# Patient Record
Sex: Female | Born: 1940 | Race: Black or African American | Hispanic: No | Smoking: Never smoker
Health system: Southern US, Community
[De-identification: ages and names within clinical notes are randomized; demographics above are authoritative.]

## PROBLEM LIST (undated history)

## (undated) DIAGNOSIS — I10 Essential (primary) hypertension: Secondary | ICD-10-CM

## (undated) DIAGNOSIS — M1990 Unspecified osteoarthritis, unspecified site: Secondary | ICD-10-CM

## (undated) DIAGNOSIS — I341 Nonrheumatic mitral (valve) prolapse: Secondary | ICD-10-CM

## (undated) DIAGNOSIS — C8338 Diffuse large B-cell lymphoma, lymph nodes of multiple sites: Secondary | ICD-10-CM

## (undated) DIAGNOSIS — I340 Nonrheumatic mitral (valve) insufficiency: Secondary | ICD-10-CM

## (undated) DIAGNOSIS — E43 Unspecified severe protein-calorie malnutrition: Secondary | ICD-10-CM

## (undated) DIAGNOSIS — E7800 Pure hypercholesterolemia, unspecified: Secondary | ICD-10-CM

## (undated) HISTORY — DX: Nonrheumatic mitral (valve) prolapse: I34.1

## (undated) HISTORY — DX: Nonrheumatic mitral (valve) insufficiency: I34.0

## (undated) HISTORY — DX: Unspecified severe protein-calorie malnutrition: E43

## (undated) HISTORY — DX: Diffuse large b-cell lymphoma, lymph nodes of multiple sites: C83.38

## (undated) HISTORY — PX: ABDOMINAL HYSTERECTOMY: SHX81

## (undated) HISTORY — PX: JOINT REPLACEMENT: SHX530

## (undated) SURGERY — TOTAL HIP REVISION
Anesthesia: General | Laterality: Left

---

## 2005-02-19 ENCOUNTER — Ambulatory Visit: Payer: Self-pay | Admitting: Family Medicine

## 2005-04-03 ENCOUNTER — Ambulatory Visit: Payer: Self-pay | Admitting: Family Medicine

## 2005-04-04 ENCOUNTER — Ambulatory Visit (HOSPITAL_COMMUNITY): Admission: RE | Admit: 2005-04-04 | Discharge: 2005-04-04 | Payer: Self-pay | Admitting: Family Medicine

## 2005-05-22 ENCOUNTER — Ambulatory Visit: Payer: Self-pay | Admitting: Family Medicine

## 2005-07-10 ENCOUNTER — Ambulatory Visit: Payer: Self-pay | Admitting: Family Medicine

## 2005-07-18 ENCOUNTER — Ambulatory Visit: Payer: Self-pay | Admitting: Family Medicine

## 2005-10-24 ENCOUNTER — Encounter (INDEPENDENT_AMBULATORY_CARE_PROVIDER_SITE_OTHER): Payer: Self-pay | Admitting: Specialist

## 2005-10-24 ENCOUNTER — Encounter (INDEPENDENT_AMBULATORY_CARE_PROVIDER_SITE_OTHER): Payer: Self-pay | Admitting: Family Medicine

## 2005-10-24 ENCOUNTER — Other Ambulatory Visit: Admission: RE | Admit: 2005-10-24 | Discharge: 2005-10-24 | Payer: Self-pay | Admitting: Family Medicine

## 2005-10-24 ENCOUNTER — Ambulatory Visit: Payer: Self-pay | Admitting: Family Medicine

## 2006-02-13 ENCOUNTER — Ambulatory Visit: Payer: Self-pay | Admitting: Family Medicine

## 2006-03-31 ENCOUNTER — Ambulatory Visit: Payer: Self-pay | Admitting: Family Medicine

## 2006-04-14 ENCOUNTER — Ambulatory Visit (HOSPITAL_COMMUNITY): Admission: RE | Admit: 2006-04-14 | Discharge: 2006-04-14 | Payer: Self-pay | Admitting: Family Medicine

## 2006-09-11 ENCOUNTER — Ambulatory Visit: Payer: Self-pay | Admitting: Family Medicine

## 2007-04-22 ENCOUNTER — Ambulatory Visit: Payer: Self-pay | Admitting: Internal Medicine

## 2007-05-12 ENCOUNTER — Ambulatory Visit (HOSPITAL_COMMUNITY): Admission: RE | Admit: 2007-05-12 | Discharge: 2007-05-12 | Payer: Self-pay | Admitting: Family Medicine

## 2007-06-04 ENCOUNTER — Ambulatory Visit: Payer: Self-pay | Admitting: Family Medicine

## 2007-07-07 ENCOUNTER — Encounter (INDEPENDENT_AMBULATORY_CARE_PROVIDER_SITE_OTHER): Payer: Self-pay | Admitting: Nurse Practitioner

## 2007-07-07 ENCOUNTER — Ambulatory Visit: Payer: Self-pay | Admitting: Family Medicine

## 2007-07-07 LAB — CONVERTED CEMR LAB
ALT: 21 units/L (ref 0–35)
AST: 25 units/L (ref 0–37)
Alkaline Phosphatase: 93 units/L (ref 39–117)
Cholesterol: 175 mg/dL (ref 0–200)
Creatinine, Ser: 0.81 mg/dL (ref 0.40–1.20)
LDL Cholesterol: 97 mg/dL (ref 0–99)
Sodium: 140 meq/L (ref 135–145)
Total Bilirubin: 0.5 mg/dL (ref 0.3–1.2)
Total CHOL/HDL Ratio: 2.7
VLDL: 14 mg/dL (ref 0–40)

## 2007-09-15 ENCOUNTER — Ambulatory Visit: Payer: Self-pay | Admitting: Internal Medicine

## 2007-12-28 ENCOUNTER — Ambulatory Visit: Payer: Self-pay | Admitting: Internal Medicine

## 2007-12-29 ENCOUNTER — Ambulatory Visit (HOSPITAL_COMMUNITY): Admission: RE | Admit: 2007-12-29 | Discharge: 2007-12-29 | Payer: Self-pay | Admitting: Internal Medicine

## 2008-02-19 ENCOUNTER — Ambulatory Visit: Payer: Self-pay | Admitting: Family Medicine

## 2008-04-15 HISTORY — PX: TOTAL HIP ARTHROPLASTY: SHX124

## 2008-06-20 ENCOUNTER — Ambulatory Visit (HOSPITAL_COMMUNITY): Admission: RE | Admit: 2008-06-20 | Discharge: 2008-06-20 | Payer: Self-pay | Admitting: Family Medicine

## 2009-02-23 ENCOUNTER — Ambulatory Visit: Payer: Self-pay | Admitting: Family Medicine

## 2009-02-23 LAB — CONVERTED CEMR LAB: Microalb, Ur: 0.5 mg/dL (ref 0.00–1.89)

## 2009-04-20 ENCOUNTER — Encounter (INDEPENDENT_AMBULATORY_CARE_PROVIDER_SITE_OTHER): Payer: Self-pay | Admitting: Family Medicine

## 2009-04-20 ENCOUNTER — Ambulatory Visit: Payer: Self-pay | Admitting: Internal Medicine

## 2009-04-20 LAB — CONVERTED CEMR LAB
ALT: 14 units/L (ref 0–35)
Alkaline Phosphatase: 86 units/L (ref 39–117)
CO2: 22 meq/L (ref 19–32)
Cholesterol: 163 mg/dL (ref 0–200)
Creatinine, Ser: 0.7 mg/dL (ref 0.40–1.20)
Total Bilirubin: 0.4 mg/dL (ref 0.3–1.2)
Total CHOL/HDL Ratio: 2.6
VLDL: 13 mg/dL (ref 0–40)
Vit D, 25-Hydroxy: 34 ng/mL (ref 30–89)

## 2009-06-24 ENCOUNTER — Emergency Department (HOSPITAL_COMMUNITY): Admission: EM | Admit: 2009-06-24 | Discharge: 2009-06-24 | Payer: Self-pay | Admitting: Emergency Medicine

## 2009-06-26 ENCOUNTER — Ambulatory Visit (HOSPITAL_COMMUNITY): Admission: RE | Admit: 2009-06-26 | Discharge: 2009-06-26 | Payer: Self-pay | Admitting: Internal Medicine

## 2009-07-20 ENCOUNTER — Ambulatory Visit: Payer: Self-pay | Admitting: Family Medicine

## 2009-10-19 ENCOUNTER — Encounter: Admission: RE | Admit: 2009-10-19 | Discharge: 2009-11-23 | Payer: Self-pay | Admitting: Family Medicine

## 2009-12-12 ENCOUNTER — Inpatient Hospital Stay (HOSPITAL_COMMUNITY): Admission: RE | Admit: 2009-12-12 | Discharge: 2009-12-15 | Payer: Self-pay | Admitting: Orthopedic Surgery

## 2010-05-29 ENCOUNTER — Other Ambulatory Visit (HOSPITAL_COMMUNITY): Payer: Self-pay | Admitting: Pulmonary Disease

## 2010-05-29 ENCOUNTER — Other Ambulatory Visit: Payer: Self-pay

## 2010-05-29 DIAGNOSIS — Z1231 Encounter for screening mammogram for malignant neoplasm of breast: Secondary | ICD-10-CM

## 2010-06-28 LAB — CBC
MCH: 29.8 pg (ref 26.0–34.0)
MCHC: 33.9 g/dL (ref 30.0–36.0)
Platelets: 68 10*3/uL — ABNORMAL LOW (ref 150–400)
RBC: 2.89 MIL/uL — ABNORMAL LOW (ref 3.87–5.11)

## 2010-06-28 LAB — PROTIME-INR
INR: 1.67 — ABNORMAL HIGH (ref 0.00–1.49)
Prothrombin Time: 31.4 seconds — ABNORMAL HIGH (ref 11.6–15.2)

## 2010-06-29 ENCOUNTER — Ambulatory Visit (HOSPITAL_COMMUNITY)
Admission: RE | Admit: 2010-06-29 | Discharge: 2010-06-29 | Disposition: A | Discharge status: Home / Self Care | Payer: Medicare Other | Source: Ambulatory Visit | Attending: Pulmonary Disease | Admitting: Pulmonary Disease

## 2010-06-29 DIAGNOSIS — Z1231 Encounter for screening mammogram for malignant neoplasm of breast: Secondary | ICD-10-CM | POA: Insufficient documentation

## 2010-06-29 LAB — COMPREHENSIVE METABOLIC PANEL
BUN: 16 mg/dL (ref 6–23)
CO2: 31 mEq/L (ref 19–32)
Chloride: 100 mEq/L (ref 96–112)
Creatinine, Ser: 0.74 mg/dL (ref 0.4–1.2)
GFR calc non Af Amer: >60 mL/min (ref >60)
Glucose, Bld: 94 mg/dL (ref 70–99)
Total Bilirubin: 0.8 mg/dL (ref 0.3–1.2)

## 2010-06-29 LAB — TYPE AND SCREEN: Antibody Screen: NEGATIVE

## 2010-06-29 LAB — CBC
HCT: 24 % — ABNORMAL LOW (ref 36.0–46.0)
Hemoglobin: 12.2 g/dL (ref 12.0–15.0)
MCH: 30 pg (ref 26.0–34.0)
MCH: 30.2 pg (ref 26.0–34.0)
MCH: 30.6 pg (ref 26.0–34.0)
MCHC: 33.6 g/dL (ref 30.0–36.0)
MCHC: 34.2 g/dL (ref 30.0–36.0)
MCV: 89.6 fL (ref 78.0–100.0)
MCV: 89.9 fL (ref 78.0–100.0)
MCV: 91.6 fL (ref 78.0–100.0)
Platelets: 69 10*3/uL — ABNORMAL LOW (ref 150–400)
RBC: 4.07 MIL/uL (ref 3.87–5.11)
RDW: 13.5 % (ref 11.5–15.5)
RDW: 13.8 % (ref 11.5–15.5)
WBC: 6.6 10*3/uL (ref 4.0–10.5)

## 2010-06-29 LAB — URINALYSIS, ROUTINE W REFLEX MICROSCOPIC
Ketones, ur: NEGATIVE mg/dL
Nitrite: NEGATIVE
Urobilinogen, UA: 0.2 mg/dL (ref 0.0–1.0)
pH: 6 (ref 5.0–8.0)

## 2010-06-29 LAB — SURGICAL PCR SCREEN: Staphylococcus aureus: NEGATIVE

## 2010-06-29 LAB — APTT: aPTT: 31 seconds (ref 24–37)

## 2010-06-29 LAB — PROTIME-INR
INR: 0.87 (ref 0.00–1.49)
Prothrombin Time: 17.4 seconds — ABNORMAL HIGH (ref 11.6–15.2)

## 2011-04-21 ENCOUNTER — Emergency Department (HOSPITAL_COMMUNITY)
Admission: EM | Admit: 2011-04-21 | Discharge: 2011-04-21 | Disposition: A | Discharge status: Home / Self Care | Payer: Medicare Other | Attending: Emergency Medicine | Admitting: Emergency Medicine

## 2011-04-21 ENCOUNTER — Emergency Department (HOSPITAL_COMMUNITY): Payer: Medicare Other

## 2011-04-21 DIAGNOSIS — Z96649 Presence of unspecified artificial hip joint: Secondary | ICD-10-CM | POA: Insufficient documentation

## 2011-04-21 DIAGNOSIS — M129 Arthropathy, unspecified: Secondary | ICD-10-CM | POA: Insufficient documentation

## 2011-04-21 DIAGNOSIS — T84019A Broken internal joint prosthesis, unspecified site, initial encounter: Secondary | ICD-10-CM | POA: Insufficient documentation

## 2011-04-21 DIAGNOSIS — Y831 Surgical operation with implant of artificial internal device as the cause of abnormal reaction of the patient, or of later complication, without mention of misadventure at the time of the procedure: Secondary | ICD-10-CM | POA: Insufficient documentation

## 2011-04-21 DIAGNOSIS — Z79899 Other long term (current) drug therapy: Secondary | ICD-10-CM | POA: Insufficient documentation

## 2011-04-21 DIAGNOSIS — M25559 Pain in unspecified hip: Secondary | ICD-10-CM | POA: Insufficient documentation

## 2011-04-21 DIAGNOSIS — T84011A Broken internal left hip prosthesis, initial encounter: Secondary | ICD-10-CM

## 2011-04-21 DIAGNOSIS — E7800 Pure hypercholesterolemia, unspecified: Secondary | ICD-10-CM | POA: Insufficient documentation

## 2011-04-21 DIAGNOSIS — Z7982 Long term (current) use of aspirin: Secondary | ICD-10-CM | POA: Insufficient documentation

## 2011-04-21 DIAGNOSIS — I10 Essential (primary) hypertension: Secondary | ICD-10-CM | POA: Insufficient documentation

## 2011-04-21 HISTORY — DX: Pure hypercholesterolemia, unspecified: E78.00

## 2011-04-21 HISTORY — DX: Essential (primary) hypertension: I10

## 2011-04-21 HISTORY — DX: Unspecified osteoarthritis, unspecified site: M19.90

## 2011-04-21 LAB — CBC
MCH: 28.7 pg (ref 26.0–34.0)
MCHC: 32.3 g/dL (ref 30.0–36.0)
MCV: 88.9 fL (ref 78.0–100.0)
Platelets: 235 10*3/uL (ref 150–400)
RBC: 4.25 MIL/uL (ref 3.87–5.11)

## 2011-04-21 NOTE — ED Provider Notes (Signed)
History     CSN: [id number]  Arrival date & time 04/21/11  0857   First MD Initiated Contact with Patient 04/21/11 [phone]      Chief Complaint  Patient presents with  . Hip Pain    (Consider location/radiation/quality/duration/timing/severity/associated sxs/prior treatment) Patient is a 71 y.o. female presenting with hip pain. The history is provided by the patient.  Hip Pain This is a recurrent problem. The current episode started yesterday (Over the last few days she has been very active walking long distances, standing for prolonged periods of time and also trying a new exercise machine). The problem occurs constantly. The problem has not changed since onset.Associated symptoms comments: No other symptom. The symptoms are aggravated by walking. The symptoms are relieved by relaxation and narcotics. Treatments tried: Took a Vicodin yesterday. The treatment provided moderate relief.    Past Medical History  Diagnosis Date  . Hypertension   . Arthritis   . Hypercholesteremia     Past Surgical History  Procedure Date  . Joint replacement     No family history on file.  History  Substance Use Topics  . Smoking status: Never Smoker   . Smokeless tobacco: Not on file  . Alcohol Use: No    OB History    Grav Para Term Preterm Abortions TAB SAB Ect Mult Living                  Review of Systems  All other systems reviewed and are negative.    Allergies  Review of patient's allergies indicates no known allergies.  Home Medications   Current Outpatient Rx  Name Route Sig Dispense Refill  . ASPIRIN 81 MG PO TBEC Oral Take 81 mg by mouth daily. Swallow whole.     [name] Kitchen BENAZEPRIL-HYDROCHLOROTHIAZIDE 20-12.5 MG PO TABS Oral Take 1 tablet by mouth daily.      [name] Kitchen BLACK COHOSH EXTRACT PO Oral Take 1 capsule by mouth daily.      [name] Kitchen CITRACAL MAXIMUM PO Oral Take 1 tablet by mouth daily.      [name] Kitchen CILOSTAZOL 100 MG PO TABS Oral Take 100 mg by mouth 2 (two) times daily.      .  ETODOLAC ER 400 MG PO TB24 Oral Take 400 mg by mouth daily.      [name] Kitchen HYDROCODONE-ACETAMINOPHEN 10-650 MG PO TABS Oral Take 1 tablet by mouth every 6 (six) hours as needed.      . OMEGA-3-ACID ETHYL ESTERS 1 G PO CAPS Oral Take 1 g by mouth daily.      [name] Kitchen SIMVASTATIN 10 MG PO TABS Oral Take 10 mg by mouth at bedtime.      [name] Kitchen VITAMIN B12 100 MCG PO TABS Oral Take 100 mcg by mouth daily.        BP 110/78  Pulse 67  Temp(Src) 97.6 F (36.4 C) (Oral)  SpO2 98%  Physical Exam  Nursing note and vitals reviewed. Constitutional: She is oriented to person, place, and time. She appears well-developed and well-nourished. No distress.  HENT:  Head: Normocephalic and atraumatic.  Eyes: EOM are normal. Pupils are equal, round, and reactive to light.  Cardiovascular: Intact distal pulses.   Musculoskeletal: She exhibits no edema.       Left hip: She exhibits bony tenderness. She exhibits normal range of motion, normal strength and no deformity.  Neurological: She is alert and oriented to person, place, and time. She has normal strength. No sensory deficit.  Skin: Skin is warm and  dry. No rash noted. No erythema.    ED Course  Procedures (including critical care time)  Labs Reviewed - No data to display Dg Hip Complete Left  04/21/2011  *RADIOLOGY REPORT*  Clinical Data: Pain, hip replacement  LEFT HIP - COMPLETE 2+ VIEW  Comparison: 12/12/2009  Findings: Left total hip arthroplasty.  The acetabular component has loosening and now appears vertical in orientation.  One of the two superior screws appears fractured.  The third screw has backed out of the acetabulum.  The femoral component is still well seated within the acetabular component.  No fracture is seen.  IMPRESSION: Left total hip arthroplasty with loosening of the acetabular component/hardware fracturing, as described above.  Original Report Authenticated By: Charline Bills, M.D.     No diagnosis found.    MDM   Patient with a history of  a left hip replacement several years ago. She states yesterday and today her left hip and upper leg or hurting much worse than normal. 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 She took a hydrocodone which improved the pain yesterday for a while but then did not take one today and just wanted to make sure her leg was okay. She has normal sensation, no edema, good pulses and only mild pain with palpation of the hip. No back pain and otherwise well appearing with normal vital signs. Will get a plain film to evaluate the hip replacement.  10:10 AM X-ray shows possible component loosening and fracture of one of the superior screws. Will speak with Dr. Myrtie Neither who performed the surgery for further treatment plan.   10:19 AM Spoke with Dr. Montez Morita and he wanted her films printed out which we are unable to do today but for the patient to followup in the office tomorrow at 10 AM.  Gwyneth Sprout, MD 04/21/11 1020

## 2011-04-21 NOTE — ED Notes (Signed)
Pt in from home with c/o left hip pain hx of hip replacement 2011 states intermitant pain with difficulty walking since denies recent injury pt uses a walker for assistance no apparent distress pt states pain is worsened when its cold and moving from a sitting or lying position  to a standing position

## 2011-04-23 ENCOUNTER — Encounter (HOSPITAL_COMMUNITY): Payer: Self-pay

## 2011-04-29 ENCOUNTER — Other Ambulatory Visit: Payer: Self-pay | Admitting: Orthopedic Surgery

## 2011-04-30 ENCOUNTER — Encounter (HOSPITAL_COMMUNITY)
Admission: RE | Admit: 2011-04-30 | Discharge: 2011-04-30 | Disposition: A | Discharge status: Home / Self Care | Payer: Medicare Other | Source: Ambulatory Visit | Attending: Anesthesiology | Admitting: Anesthesiology

## 2011-04-30 ENCOUNTER — Encounter (HOSPITAL_COMMUNITY): Payer: Self-pay

## 2011-04-30 ENCOUNTER — Encounter (HOSPITAL_COMMUNITY)
Admission: RE | Admit: 2011-04-30 | Discharge: 2011-04-30 | Disposition: A | Discharge status: Home / Self Care | Payer: Medicare Other | Source: Ambulatory Visit | Attending: Orthopedic Surgery | Admitting: Orthopedic Surgery

## 2011-04-30 ENCOUNTER — Other Ambulatory Visit (HOSPITAL_COMMUNITY): Payer: Medicare Other

## 2011-04-30 ENCOUNTER — Other Ambulatory Visit: Payer: Self-pay | Admitting: Orthopedic Surgery

## 2011-04-30 ENCOUNTER — Other Ambulatory Visit: Payer: Self-pay

## 2011-04-30 LAB — COMPREHENSIVE METABOLIC PANEL
CO2: 32 mEq/L (ref 19–32)
Calcium: 10.3 mg/dL (ref 8.4–10.5)
Creatinine, Ser: 0.68 mg/dL (ref 0.50–1.10)
GFR calc Af Amer: >90 mL/min (ref >90)
GFR calc non Af Amer: 86 mL/min — ABNORMAL LOW (ref >90)
Glucose, Bld: 94 mg/dL (ref 70–99)
Sodium: 137 mEq/L (ref 135–145)
Total Protein: 6.9 g/dL (ref 6.0–8.3)

## 2011-04-30 LAB — TYPE AND SCREEN
Unit division: 0
Unit division: 0
Unit division: 0

## 2011-04-30 LAB — CBC
Hemoglobin: 11.6 g/dL — ABNORMAL LOW (ref 12.0–15.0)
MCH: 28.9 pg (ref 26.0–34.0)
MCHC: 32.4 g/dL (ref 30.0–36.0)
MCV: 89.3 fL (ref 78.0–100.0)
RBC: 4.01 MIL/uL (ref 3.87–5.11)

## 2011-04-30 LAB — PROTIME-INR
INR: 1 (ref 0.00–1.49)
Prothrombin Time: 13.4 seconds (ref 11.6–15.2)

## 2011-04-30 LAB — URINALYSIS, ROUTINE W REFLEX MICROSCOPIC
Ketones, ur: NEGATIVE mg/dL
Leukocytes, UA: NEGATIVE
Nitrite: NEGATIVE
Protein, ur: NEGATIVE mg/dL
Urobilinogen, UA: 0.2 mg/dL (ref 0.0–1.0)

## 2011-04-30 LAB — PREPARE RBC (CROSSMATCH)

## 2011-04-30 NOTE — Pre-Procedure Instructions (Addendum)
20 Christina Peterson  04/30/2011   Your procedure is scheduled on Jan 17.Report to [facility] at [phone].  Call this number if you have problems the morning of surgery: [phone]   Remember:   Do not eat food:After Midnight.  May have clear liquids: up to 4 Hours before arrival.  Clear liquids include soda, tea, black coffee, apple or grape juice, broth.  Take these medicines the morning of surgery with A SIP OF WATER: hydrocodone as desired  Do not wear jewelry, make-up or nail polish.  Do not wear lotions, powders, or perfumes. You may wear deodorant.  Do not shave 48 hours prior to surgery.  Do not bring valuables to the hospital.  Contacts, dentures or bridgework may not be worn into surgery.  Leave suitcase in the car. After surgery it may be brought to your room.  For patients admitted to the hospital, checkout time is 11:00 AM the day of discharge.   Patients discharged the day of surgery will not be allowed to drive home.  Name and phone number of your driver: family  Special Instructions: CHG Shower Use Special Wash: 1/2 bottle night before surgery and 1/2 bottle morning of surgery.   Please read over the following fact sheets that you were given: Pain Booklet, Total Joint Packet, MRSA Information and Surgical Site Infection Prevention

## 2011-05-01 ENCOUNTER — Other Ambulatory Visit: Payer: Self-pay | Admitting: Orthopedic Surgery

## 2011-05-01 ENCOUNTER — Other Ambulatory Visit (HOSPITAL_COMMUNITY): Payer: Self-pay | Admitting: *Deleted

## 2011-05-01 MED ORDER — CHLORHEXIDINE GLUCONATE 4 % EX LIQD: 60.0000 mL | Freq: Once | CUTANEOUS | Status: DC

## 2011-05-01 MED ORDER — CEFAZOLIN SODIUM-DEXTROSE 2-3 GM-% IV SOLR
2.0000 g | INTRAVENOUS | Status: AC
  Administered 2011-05-02: 2 g via INTRAVENOUS

## 2011-05-01 NOTE — Progress Notes (Signed)
Dr. Celene Skeen office called and ask that he place clairification of consent in epic.

## 2011-05-01 NOTE — Progress Notes (Unsigned)
THIS IS A 70 Y/O FEMALE WITH HISTORY OF LEFT TOTAL HIP OVER A YEAR AGO WITHOUT PROBLEMS UNTIL 2 WEEKS AGO. PATIENT DEVELOPED PAIN IN HER LEFT HIP WITH-OUT TRAUMA. X-RAY REVEALED LOOSENING OF THE ACETABULAR COMPONENT AND SCREWS.H&P has been dictated. Report #[id number]. PATIENT IS SCHEDULED FOR LEFT TOTAL HIP REVISION  AND BONE GRAFTING OF THE ACETABULUM.

## 2011-05-01 NOTE — Progress Notes (Signed)
Pt instructed to arrive @0530  05/02/11

## 2011-05-02 ENCOUNTER — Ambulatory Visit (HOSPITAL_COMMUNITY): Payer: Medicare Other | Admitting: Anesthesiology

## 2011-05-02 ENCOUNTER — Encounter (HOSPITAL_COMMUNITY): Payer: Self-pay | Admitting: Anesthesiology

## 2011-05-02 ENCOUNTER — Ambulatory Visit (HOSPITAL_COMMUNITY): Payer: Medicare Other

## 2011-05-02 ENCOUNTER — Encounter (HOSPITAL_COMMUNITY): Payer: Self-pay | Admitting: *Deleted

## 2011-05-02 ENCOUNTER — Inpatient Hospital Stay (HOSPITAL_COMMUNITY)
Admission: RE | Admit: 2011-05-02 | Discharge: 2011-05-07 | DRG: 467 | Disposition: A | Discharge status: Skilled Nursing Facility | Payer: Medicare Other | Source: Ambulatory Visit | Attending: Orthopedic Surgery | Admitting: Orthopedic Surgery

## 2011-05-02 ENCOUNTER — Encounter (HOSPITAL_COMMUNITY)
Admission: RE | Disposition: A | Discharge status: Skilled Nursing Facility | Payer: Self-pay | Source: Ambulatory Visit | Attending: Orthopedic Surgery

## 2011-05-02 DIAGNOSIS — Z7901 Long term (current) use of anticoagulants: Secondary | ICD-10-CM

## 2011-05-02 DIAGNOSIS — Y92009 Unspecified place in unspecified non-institutional (private) residence as the place of occurrence of the external cause: Secondary | ICD-10-CM

## 2011-05-02 DIAGNOSIS — I10 Essential (primary) hypertension: Secondary | ICD-10-CM | POA: Diagnosis present

## 2011-05-02 DIAGNOSIS — E7800 Pure hypercholesterolemia, unspecified: Secondary | ICD-10-CM | POA: Diagnosis present

## 2011-05-02 DIAGNOSIS — D62 Acute posthemorrhagic anemia: Secondary | ICD-10-CM | POA: Diagnosis not present

## 2011-05-02 DIAGNOSIS — T84039A Mechanical loosening of unspecified internal prosthetic joint, initial encounter: Principal | ICD-10-CM | POA: Diagnosis present

## 2011-05-02 DIAGNOSIS — Z01812 Encounter for preprocedural laboratory examination: Secondary | ICD-10-CM

## 2011-05-02 DIAGNOSIS — Z79899 Other long term (current) drug therapy: Secondary | ICD-10-CM

## 2011-05-02 DIAGNOSIS — T84038A Mechanical loosening of other internal prosthetic joint, initial encounter: Secondary | ICD-10-CM

## 2011-05-02 DIAGNOSIS — Y831 Surgical operation with implant of artificial internal device as the cause of abnormal reaction of the patient, or of later complication, without mention of misadventure at the time of the procedure: Secondary | ICD-10-CM | POA: Diagnosis present

## 2011-05-02 DIAGNOSIS — Z96649 Presence of unspecified artificial hip joint: Secondary | ICD-10-CM

## 2011-05-02 DIAGNOSIS — M899 Disorder of bone, unspecified: Secondary | ICD-10-CM | POA: Diagnosis present

## 2011-05-02 HISTORY — PX: TOTAL HIP REVISION: SHX763

## 2011-05-02 LAB — GRAM STAIN

## 2011-05-02 LAB — ANAEROBIC CULTURE

## 2011-05-02 LAB — WOUND CULTURE: Gram Stain: NONE SEEN

## 2011-05-02 SURGERY — TOTAL HIP REVISION
Anesthesia: General | Site: Hip | Laterality: Left | Wound class: Clean

## 2011-05-02 MED ORDER — MENTHOL 3 MG MT LOZG: 1.0000 | LOZENGE | OROMUCOSAL | Status: DC | PRN

## 2011-05-02 MED ORDER — ACETAMINOPHEN 650 MG RE SUPP: 650.0000 mg | Freq: Four times a day (QID) | RECTAL | Status: DC | PRN

## 2011-05-02 MED ORDER — HETASTARCH-ELECTROLYTES 6 % IV SOLN
INTRAVENOUS | Status: DC | PRN
  Administered 2011-05-02: 09:00:00 via INTRAVENOUS

## 2011-05-02 MED ORDER — CALCIUM CITRATE-VITAMIN D 315-250 MG-UNIT PO TABS: ORAL_TABLET | Freq: Once | ORAL | Status: DC

## 2011-05-02 MED ORDER — ONDANSETRON HCL 4 MG/2ML IJ SOLN: 4.0000 mg | Freq: Four times a day (QID) | INTRAMUSCULAR | Status: DC | PRN

## 2011-05-02 MED ORDER — BENAZEPRIL HCL 20 MG PO TABS
20.0000 mg | ORAL_TABLET | Freq: Every day | ORAL | Status: DC
  Administered 2011-05-07: 20 mg via ORAL
  Filled 2011-05-02 (×6): qty 1

## 2011-05-02 MED ORDER — METOCLOPRAMIDE HCL 5 MG/ML IJ SOLN
5.0000 mg | Freq: Three times a day (TID) | INTRAMUSCULAR | Status: DC | PRN
  Filled 2011-05-02: qty 2

## 2011-05-02 MED ORDER — METHOCARBAMOL 100 MG/ML IJ SOLN
500.0000 mg | Freq: Four times a day (QID) | INTRAVENOUS | Status: DC | PRN
  Filled 2011-05-02: qty 5

## 2011-05-02 MED ORDER — PROPOFOL 10 MG/ML IV EMUL
INTRAVENOUS | Status: DC | PRN
  Administered 2011-05-02: 120 mg via INTRAVENOUS
  Administered 2011-05-02: 20 mg via INTRAVENOUS
  Administered 2011-05-02: 30 mg via INTRAVENOUS

## 2011-05-02 MED ORDER — ROCURONIUM BROMIDE 100 MG/10ML IV SOLN
INTRAVENOUS | Status: DC | PRN
  Administered 2011-05-02: 50 mg via INTRAVENOUS

## 2011-05-02 MED ORDER — LIDOCAINE HCL (CARDIAC) 20 MG/ML IV SOLN: INTRAVENOUS | Status: DC | PRN

## 2011-05-02 MED ORDER — FENTANYL CITRATE 0.05 MG/ML IJ SOLN
INTRAMUSCULAR | Status: DC | PRN
  Administered 2011-05-02: 100 ug via INTRAVENOUS
  Administered 2011-05-02 (×3): 50 ug via INTRAVENOUS
  Administered 2011-05-02: 100 ug via INTRAVENOUS

## 2011-05-02 MED ORDER — DIPHENHYDRAMINE HCL 12.5 MG/5ML PO ELIX
12.5000 mg | ORAL_SOLUTION | Freq: Four times a day (QID) | ORAL | Status: DC | PRN
  Filled 2011-05-02: qty 5

## 2011-05-02 MED ORDER — HYDROCHLOROTHIAZIDE 12.5 MG PO CAPS
12.5000 mg | ORAL_CAPSULE | Freq: Every day | ORAL | Status: DC
  Administered 2011-05-07: 12.5 mg via ORAL
  Filled 2011-05-02 (×6): qty 1

## 2011-05-02 MED ORDER — DEXTROSE-NACL 5-0.45 % IV SOLN
INTRAVENOUS | Status: DC
  Administered 2011-05-02 – 2011-05-04 (×3): via INTRAVENOUS

## 2011-05-02 MED ORDER — PHENYLEPHRINE HCL 10 MG/ML IJ SOLN
INTRAMUSCULAR | Status: DC | PRN
  Administered 2011-05-02 (×6): 40 ug via INTRAVENOUS

## 2011-05-02 MED ORDER — PATIENT'S GUIDE TO USING COUMADIN BOOK
Freq: Once | Status: AC
  Administered 2011-05-02: 18:00:00
  Filled 2011-05-02: qty 1

## 2011-05-02 MED ORDER — WARFARIN VIDEO: Freq: Once | Status: DC

## 2011-05-02 MED ORDER — NALOXONE HCL 0.4 MG/ML IJ SOLN: 0.4000 mg | INTRAMUSCULAR | Status: DC | PRN

## 2011-05-02 MED ORDER — PHENYLEPHRINE HCL 10 MG/ML IJ SOLN
10.0000 mg | INTRAVENOUS | Status: DC | PRN
  Administered 2011-05-02 (×2): 10 ug/min via INTRAVENOUS

## 2011-05-02 MED ORDER — SIMVASTATIN 10 MG PO TABS
10.0000 mg | ORAL_TABLET | Freq: Every day | ORAL | Status: DC
  Administered 2011-05-02 – 2011-05-06 (×5): 10 mg via ORAL
  Filled 2011-05-02 (×6): qty 1

## 2011-05-02 MED ORDER — DROPERIDOL 2.5 MG/ML IJ SOLN: 0.6250 mg | INTRAMUSCULAR | Status: DC | PRN

## 2011-05-02 MED ORDER — NEOSTIGMINE METHYLSULFATE 1 MG/ML IJ SOLN
INTRAMUSCULAR | Status: DC | PRN
  Administered 2011-05-02: 3 mg via INTRAVENOUS

## 2011-05-02 MED ORDER — CHLORHEXIDINE GLUCONATE 4 % EX LIQD: 60.0000 mL | Freq: Once | CUTANEOUS | Status: DC

## 2011-05-02 MED ORDER — METOCLOPRAMIDE HCL 10 MG PO TABS: 5.0000 mg | ORAL_TABLET | Freq: Three times a day (TID) | ORAL | Status: DC | PRN

## 2011-05-02 MED ORDER — ONDANSETRON HCL 4 MG PO TABS: 4.0000 mg | ORAL_TABLET | Freq: Four times a day (QID) | ORAL | Status: DC | PRN

## 2011-05-02 MED ORDER — HYDROMORPHONE HCL PF 1 MG/ML IJ SOLN
0.2500 mg | INTRAMUSCULAR | Status: DC | PRN
  Administered 2011-05-02: 0.5 mg via INTRAVENOUS

## 2011-05-02 MED ORDER — GLYCOPYRROLATE 0.2 MG/ML IJ SOLN
INTRAMUSCULAR | Status: DC | PRN
  Administered 2011-05-02: .15 mg via INTRAVENOUS
  Administered 2011-05-02: .4 mg via INTRAVENOUS

## 2011-05-02 MED ORDER — OXYCODONE-ACETAMINOPHEN 5-325 MG PO TABS
1.0000 | ORAL_TABLET | ORAL | Status: DC | PRN
  Administered 2011-05-02: 1 via ORAL
  Administered 2011-05-03 – 2011-05-05 (×5): 2 via ORAL
  Administered 2011-05-05 (×2): 1 via ORAL
  Administered 2011-05-06 (×2): 2 via ORAL
  Administered 2011-05-06: 1 via ORAL
  Administered 2011-05-07: 2 via ORAL
  Filled 2011-05-02 (×5): qty 2
  Filled 2011-05-02 (×2): qty 1
  Filled 2011-05-02 (×3): qty 2
  Filled 2011-05-02 (×2): qty 1

## 2011-05-02 MED ORDER — PHENOL 1.4 % MT LIQD
1.0000 | OROMUCOSAL | Status: DC | PRN
  Filled 2011-05-02: qty 177

## 2011-05-02 MED ORDER — LACTATED RINGERS IV SOLN
INTRAVENOUS | Status: DC | PRN
  Administered 2011-05-02 (×3): via INTRAVENOUS

## 2011-05-02 MED ORDER — CEFAZOLIN SODIUM 1-5 GM-% IV SOLN
1.0000 g | Freq: Three times a day (TID) | INTRAVENOUS | Status: AC
  Administered 2011-05-02 – 2011-05-03 (×3): 1 g via INTRAVENOUS
  Filled 2011-05-02 (×3): qty 50

## 2011-05-02 MED ORDER — SODIUM CHLORIDE 0.9 % IJ SOLN: 9.0000 mL | INTRAMUSCULAR | Status: DC | PRN

## 2011-05-02 MED ORDER — BENAZEPRIL-HYDROCHLOROTHIAZIDE 20-12.5 MG PO TABS: 1.0000 | ORAL_TABLET | Freq: Every day | ORAL | Status: DC

## 2011-05-02 MED ORDER — ACETAMINOPHEN 325 MG PO TABS
650.0000 mg | ORAL_TABLET | Freq: Four times a day (QID) | ORAL | Status: DC | PRN
  Administered 2011-05-03 – 2011-05-04 (×2): 650 mg via ORAL
  Filled 2011-05-02 (×2): qty 2

## 2011-05-02 MED ORDER — WARFARIN SODIUM 2.5 MG PO TABS
2.5000 mg | ORAL_TABLET | Freq: Once | ORAL | Status: DC
  Filled 2011-05-02: qty 1

## 2011-05-02 MED ORDER — OMEGA-3-ACID ETHYL ESTERS 1 G PO CAPS
1.0000 g | ORAL_CAPSULE | Freq: Every day | ORAL | Status: DC
  Administered 2011-05-03 – 2011-05-07 (×5): 1 g via ORAL
  Filled 2011-05-02 (×6): qty 1

## 2011-05-02 MED ORDER — CILOSTAZOL 100 MG PO TABS
100.0000 mg | ORAL_TABLET | Freq: Two times a day (BID) | ORAL | Status: DC
  Administered 2011-05-02 – 2011-05-07 (×10): 100 mg via ORAL
  Filled 2011-05-02 (×12): qty 1

## 2011-05-02 MED ORDER — HYDROMORPHONE 0.3 MG/ML IV SOLN
INTRAVENOUS | Status: DC
  Administered 2011-05-02: 0.6 mg via INTRAVENOUS
  Administered 2011-05-02: 13:00:00 via INTRAVENOUS

## 2011-05-02 MED ORDER — MIDAZOLAM HCL 5 MG/5ML IJ SOLN
INTRAMUSCULAR | Status: DC | PRN
  Administered 2011-05-02 (×2): 1 mg via INTRAVENOUS

## 2011-05-02 MED ORDER — CALCIUM CARBONATE-VITAMIN D 500-200 MG-UNIT PO TABS
1.0000 | ORAL_TABLET | Freq: Once | ORAL | Status: AC
  Administered 2011-05-02: 1 via ORAL
  Filled 2011-05-02: qty 1

## 2011-05-02 MED ORDER — SODIUM CHLORIDE 0.9 % IV SOLN
INTRAVENOUS | Status: DC | PRN
  Administered 2011-05-02: 11:00:00 via INTRAVENOUS

## 2011-05-02 MED ORDER — ALUM & MAG HYDROXIDE-SIMETH 200-200-20 MG/5ML PO SUSP: 30.0000 mL | ORAL | Status: DC | PRN

## 2011-05-02 MED ORDER — DIPHENHYDRAMINE HCL 50 MG/ML IJ SOLN: 12.5000 mg | Freq: Four times a day (QID) | INTRAMUSCULAR | Status: DC | PRN

## 2011-05-02 MED ORDER — METHOCARBAMOL 500 MG PO TABS
500.0000 mg | ORAL_TABLET | Freq: Four times a day (QID) | ORAL | Status: DC | PRN
  Administered 2011-05-03 – 2011-05-04 (×2): 500 mg via ORAL
  Filled 2011-05-02 (×3): qty 1

## 2011-05-02 SURGICAL SUPPLY — 59 items
36mm 10deg size 24 ringloc acetabular liner ×2 IMPLANT
36mm modular head type I taper ×2 IMPLANT
BIT DRILL RINGLOC QUICK CONN (BIT) ×2 IMPLANT
BRUSH FEMORAL CANAL (MISCELLANEOUS) IMPLANT
BUR STRYKR EGG 5.0 (BURR) ×2 IMPLANT
CLOTH BEACON ORANGE TIMEOUT ST (SAFETY) ×2 IMPLANT
COVER SURGICAL LIGHT HANDLE (MISCELLANEOUS) ×2 IMPLANT
CUP ACETAB MULTI HOLE 56M (Cup) ×2 IMPLANT
DECANTER SPIKE VIAL GLASS SM (MISCELLANEOUS) IMPLANT
DRAPE C-ARM 42X72 X-RAY (DRAPES) IMPLANT
DRAPE ORTHO SPLIT 77X108 STRL (DRAPES) ×3
DRAPE PROXIMA HALF (DRAPES) ×4 IMPLANT
DRAPE SURG ORHT 6 SPLT 77X108 (DRAPES) ×3 IMPLANT
DRAPE U-SHAPE 47X51 STRL (DRAPES) IMPLANT
DRILL BIT RINGLOC QUICK CONN (BIT) ×2
DRSG MEPILEX BORDER 4X12 (GAUZE/BANDAGES/DRESSINGS) ×2 IMPLANT
DRSG MEPILEX BORDER 4X8 (GAUZE/BANDAGES/DRESSINGS) IMPLANT
DURAPREP 26ML APPLICATOR (WOUND CARE) ×2 IMPLANT
ELECT BLADE 4.0 EZ CLEAN MEGAD (MISCELLANEOUS) ×2
ELECT BLADE 6.5 EXT (BLADE) IMPLANT
ELECT CAUTERY BLADE 6.4 (BLADE) ×2 IMPLANT
ELECT REM PT RETURN 9FT ADLT (ELECTROSURGICAL) ×2
ELECTRODE BLDE 4.0 EZ CLN MEGD (MISCELLANEOUS) ×1 IMPLANT
ELECTRODE REM PT RTRN 9FT ADLT (ELECTROSURGICAL) ×1 IMPLANT
EVACUATOR 1/8 PVC DRAIN (DRAIN) IMPLANT
EVACUATOR 3/16  PVC DRAIN (DRAIN)
EVACUATOR 3/16 PVC DRAIN (DRAIN) IMPLANT
FACESHIELD LNG OPTICON STERILE (SAFETY) ×4 IMPLANT
GLOVE SS PI 9.0 STRL (GLOVE) ×4 IMPLANT
GOWN PREVENTION PLUS XLARGE (GOWN DISPOSABLE) ×2 IMPLANT
GOWN STRL NON-REIN LRG LVL3 (GOWN DISPOSABLE) ×4 IMPLANT
HANDPIECE INTERPULSE COAX TIP (DISPOSABLE)
KIT BASIN OR (CUSTOM PROCEDURE TRAY) ×2 IMPLANT
KIT ROOM TURNOVER OR (KITS) ×2 IMPLANT
MANIFOLD NEPTUNE II (INSTRUMENTS) ×2 IMPLANT
NS IRRIG 1000ML POUR BTL (IV SOLUTION) ×2 IMPLANT
PACK TOTAL JOINT (CUSTOM PROCEDURE TRAY) ×2 IMPLANT
PAD ARMBOARD 7.5X6 YLW CONV (MISCELLANEOUS) ×4 IMPLANT
PILLOW ABDUCTION HIP (SOFTGOODS) ×2 IMPLANT
SCREW 6.5X20MM LP BONE (Screw) ×2 IMPLANT
SCREW BIOMET (Screw) ×8 IMPLANT
SET HNDPC FAN SPRY TIP SCT (DISPOSABLE) IMPLANT
SPONGE LAP 18X18 X RAY DECT (DISPOSABLE) ×4 IMPLANT
STAPLER VISISTAT 35W (STAPLE) ×2 IMPLANT
SUCTION FRAZIER TIP 10 FR DISP (SUCTIONS) ×4 IMPLANT
SUT ETHIBOND NAB CT1 #1 30IN (SUTURE) ×8 IMPLANT
SUT VIC AB 0 CT1 27 (SUTURE) ×2
SUT VIC AB 0 CT1 27XBRD ANBCTR (SUTURE) ×2 IMPLANT
SUT VIC AB 1 CT1 27 (SUTURE) ×2
SUT VIC AB 1 CT1 27XBRD ANBCTR (SUTURE) ×2 IMPLANT
SUT VIC AB 2-0 CT1 27 (SUTURE) ×2
SUT VIC AB 2-0 CT1 TAPERPNT 27 (SUTURE) ×2 IMPLANT
SWAB CULTURE LIQUID MINI MALE (MISCELLANEOUS) ×2 IMPLANT
TOWEL OR 17X24 6PK STRL BLUE (TOWEL DISPOSABLE) ×2 IMPLANT
TOWEL OR 17X26 10 PK STRL BLUE (TOWEL DISPOSABLE) ×2 IMPLANT
TOWER CARTRIDGE SMART MIX (DISPOSABLE) IMPLANT
TRAY FOLEY CATH 14FR (SET/KITS/TRAYS/PACK) ×2 IMPLANT
TUBE ANAEROBIC SPECIMEN COL (MISCELLANEOUS) ×2 IMPLANT
WATER STERILE IRR 1000ML POUR (IV SOLUTION) ×2 IMPLANT

## 2011-05-02 NOTE — Plan of Care (Signed)
Problem: Consults Goal: Diagnosis- Total Joint Replacement Outcome: Completed/Met Date Met:  05/02/11 Primary Total Hip

## 2011-05-02 NOTE — Transfer of Care (Signed)
Immediate Anesthesia Transfer of Care Note  Patient: Christina Peterson  Procedure(s) Performed:  TOTAL HIP REVISION - left total hip revision with bone grafting  Patient Location: PACU  Anesthesia Type: General  Level of Consciousness: awake  Airway & Oxygen Therapy: Patient Spontanous Breathing and Patient connected to nasal cannula oxygen  Post-op Assessment: Report given to PACU RN and Post -op Vital signs reviewed and stable  Post vital signs: stable Filed Vitals:   05/02/11 1244  BP:   Pulse: 85  Temp: 36.6 C  Resp: 18    Complications: No apparent anesthesia complications

## 2011-05-02 NOTE — Brief Op Note (Signed)
05/02/2011  12:26 PM  PATIENT:  Christina Peterson  71 y.o. female  PRE-OPERATIVE DIAGNOSIS:  LEFT TOTAL HIP ACETABULUM HARDWARE FAILURE  POST-OPERATIVE DIAGNOSIS:  LEFT TOTAL HIP ACETABULUM HARDWARE FAILURE  PROCEDURE:  Procedure(s): TOTAL HIP REVISION  SURGEON:  Surgeon(s): Kennieth Rad, MD  PHYSICIAN ASSISTANT:   ASSISTANTS: none   ANESTHESIA:   general  EBL:  Total I/O In: 3100 [I.V.:2250; Blood:350; IV Piggyback:500] Out: 1490 [Urine:290; Blood:1200]  BLOOD ADMINISTERED:none and none CC PRBC  DRAINS: none   LOCAL MEDICATIONS USED:  NONE  SPECIMEN:  No Specimen  DISPOSITION OF SPECIMEN:  N/A  COUNTS:  YES  TOURNIQUET:  * No tourniquets in log *  DICTATION: .Other Dictation: Dictation Number [id number]  PLAN OF CARE: Admit to inpatient   PATIENT DISPOSITION:  PACU - hemodynamically stable.   Delay start of Pharmacological VTE agent (>24hrs) due to surgical blood loss or risk of bleeding:YES

## 2011-05-02 NOTE — Anesthesia Postprocedure Evaluation (Signed)
Anesthesia Post Note  Patient: Christina Peterson  Procedure(s) Performed:  TOTAL HIP REVISION - left total hip revision with bone grafting  Anesthesia type: general  Patient location: PACU  Post pain: Pain level controlled  Post assessment: Patient's Cardiovascular Status Stable  Last Vitals:  Filed Vitals:   05/02/11 1345  BP: 99/46  Pulse: 61  Temp:   Resp: 17    Post vital signs: Reviewed and stable  Level of consciousness: sedated  Complications: No apparent anesthesia complications

## 2011-05-02 NOTE — Progress Notes (Signed)
Pt with low BP post-op and after PCA administration. 86/45, 104/52  Dr. Montez Morita notified.  Orders received to d/c PCA and add percocet PRN.  Also, due to patient's bleeding risk, per MD, coumadin to be held today and recheck labs in am.

## 2011-05-02 NOTE — Preoperative (Signed)
Beta Blockers   Reason not to administer Beta Blockers:Not Applicable 

## 2011-05-02 NOTE — Progress Notes (Signed)
Admit Complaint: 71 yo female with total hip revision  Pharmacist System-Based Medication Review:  Anticoagulation: INR 1 on 01/15; not on anticoag at home; coumadin score </= 3  1) Coumadin 2.5mg  po x1 2) Daily PT/INR

## 2011-05-02 NOTE — Anesthesia Preprocedure Evaluation (Signed)
Anesthesia Evaluation  Patient identified by MRN, date of birth, ID band Patient awake    Reviewed: Allergy & Precautions, H&P , NPO status , Patient's Chart, lab work & pertinent test results  History of Anesthesia Complications Negative for: history of anesthetic complications  Airway       Dental   Pulmonary neg pulmonary ROS,  clear to auscultation  Pulmonary exam normal       Cardiovascular hypertension, Pt. on medications Regular Normal- Systolic murmurs    Neuro/Psych Negative Neurological ROS     GI/Hepatic negative GI ROS, Neg liver ROS,   Endo/Other  Negative Endocrine ROS  Renal/GU negative Renal ROS     Musculoskeletal   Abdominal   Peds  Hematology   Anesthesia Other Findings   Reproductive/Obstetrics                           Anesthesia Physical Anesthesia Plan  ASA: III  Anesthesia Plan: General   Post-op Pain Management:    Induction: Intravenous  Airway Management Planned: Oral ETT  Additional Equipment:   Intra-op Plan:   Post-operative Plan: Extubation in OR  Informed Consent: I have reviewed the patients History and Physical, chart, labs and discussed the procedure including the risks, benefits and alternatives for the proposed anesthesia with the patient or authorized representative who has indicated his/her understanding and acceptance.     Plan Discussed with: CRNA, Anesthesiologist and Surgeon  Anesthesia Plan Comments:         Anesthesia Quick Evaluation

## 2011-05-02 NOTE — H&P (Signed)
THIS IS A 71 Y/O FEMALE WITH A LOOSE ACETABULAR COMPONENT OF HER LEFT TOTAL HIP.H&P HAS BEEN DICTATED ,REPORT # [id number].. C/O PAINFUL LEFT HIP. PMH-HYPERTENTION, HYPERCHOLESTEROLEMIA, DKD.ALLERGIES- NONE KNOWN ROS- NO CARDIAC OR RESPIRATORY SYMPTOMS, FAMILY- HISTORY NON-CONTRIBUTORY, P.EXAM- ALERT, ORIENTED. LEFT HIP TENDER, LIMITED ROM, NO SWELLING, SKIN INTACT. X-RAY- LOOSE ACETABULAR COMPONENT, LOOSE SCREWS ,OSTEOPENIA OF THE ACETABULUM. IMPRESSION- LOOSE LEFT TOTAL HIP. PLAN:LEFT TOTAL HIP REVISION AND BONE GRAFTING .

## 2011-05-02 NOTE — H&P (Signed)
NAME:  BILLIEJO, SORTO NO.:  MEDICAL RECORD NO.:  [id number]  LOCATION:                                 FACILITY:  PHYSICIAN:  Myrtie Neither, MD      DATE OF BIRTH:  [birth year]  DATE OF ADMISSION: DATE OF DISCHARGE:                             HISTORY & PHYSICAL   CHIEF COMPLAINT:  Painful left total hip.  HISTORY OF PRESENT ILLNESS:  This is a 71 year old female who had a left total hip replaced over a year ago and had done quite well up until 2 weeks ago, she was leaving a [employer] store and felt a catch in her left hip and suddenly severe pain.  The patient states that she was still able to walk but had to use hold on to things and eventually got to her walker at home.  The patient went to the [facility] Emergency Room, had x-rays done and was found to have loosening of her left total hip, the acetabular component with screws loosened and one broken.  The patient was seen in the office and evaluated.  The patient walks with a limp. The patient states that she had been using some of her pain medication. The patient denies any fever, chills, or night sweats.  No swelling in the limb or any other difficulty.  SOCIAL HISTORY:  The patient does not have any history of use of tobacco, alcohol, or illegal drugs.  MEDICATIONS: 1. Aspirin 81 mg. 2. Lotensin HCTZ 20/12.5 mg daily. 3. Black cohosh extract orally p.r.n. 4. Calcium with vitamin D daily. 5. Pletal 100 mg b.i.d. 6. Vitamin B12. 7. Lodine 400 XL b.i.d. 8. Lorcet 10 q.i.d. p.r.n. 9. Lovaza 1 g daily. 10.Zocor 10 mg daily.  FAMILY HISTORY:  Noncontributory.  REVIEW OF SYSTEMS:  Basically, no cardiac or respiratory, no urinary or bowel symptoms.  ALLERGIES:  No known.  PAST MEDICAL HISTORY:  Hypertension, left total hip arthroplasty, degenerative arthritis, hypercholesterolemia.  PHYSICAL EXAMINATION:  GENERAL:  Alert and oriented.  No acute distress. Antalgic gait with a limp on the  left side with use of walker. VITAL SIGNS:  Temperature 98.6, pulse 82, respirations 16, and blood pressure 120/80. HEENT:  Head, normocephalic.  Eyes, conjunctivae clear. NECK:  Supple. CHEST:  Clear. CARDIAC:  S1, S2 regular. EXTREMITIES:  Left hip tender anterior and laterally with pain on attempted hip extension as well as attempted rotation.  Neurovascular status is intact.  X-ray revealed loosening of the acetabular component with one acetabular screw broken and the other 2 screws were loose.  The femoral head is still into the acetabulum.  Femoral stem is intact.  IMPRESSION:  Loose left total hip.  Acetabular component was loose and broken screws; osteopenia, left acetabulum.  PLAN:  Left total hip acetabular component and bone grafting.     Myrtie Neither, MD     AC/MEDQ  D:  05/01/2011  T:  05/02/2011  Job:  [id number]

## 2011-05-02 NOTE — Anesthesia Procedure Notes (Signed)
Procedure Name: Intubation Date/Time: 05/02/2011 7:45 AM Performed by: Romie Minus Pre-anesthesia Checklist: Patient identified, Emergency Drugs available, Suction available and Patient being monitored Patient Re-evaluated:Patient Re-evaluated prior to inductionOxygen Delivery Method: Circle System Utilized Preoxygenation: Pre-oxygenation with 100% oxygen Intubation Type: IV induction Ventilation: Mask ventilation without difficulty and Oral airway inserted - appropriate to patient size Laryngoscope Size: 2 Grade View: Grade I Tube type: Oral Number of attempts: 1 Placement Confirmation: ETT inserted through vocal cords under direct vision,  positive ETCO2 and breath sounds checked- equal and bilateral Secured at: 22 cm Tube secured with: Tape Dental Injury: Teeth and Oropharynx as per pre-operative assessment

## 2011-05-03 ENCOUNTER — Other Ambulatory Visit: Payer: Self-pay | Admitting: Orthopedic Surgery

## 2011-05-03 LAB — HEMOGLOBIN AND HEMATOCRIT, BLOOD
HCT: 18.4 % — ABNORMAL LOW (ref 36.0–46.0)
HCT: 25.3 % — ABNORMAL LOW (ref 36.0–46.0)

## 2011-05-03 LAB — PROTIME-INR
INR: 1.3 (ref 0.00–1.49)
Prothrombin Time: 16.4 seconds — ABNORMAL HIGH (ref 11.6–15.2)

## 2011-05-03 NOTE — Op Note (Signed)
NAME:  Christina, Peterson NO.:  [id number]  MEDICAL RECORD NO.:  [id number]  LOCATION:  5039                         FACILITY:  [facility]  PHYSICIAN:  Myrtie Neither, MD      DATE OF BIRTH:  [birth year]  DATE OF PROCEDURE:  05/02/2011 DATE OF DISCHARGE:                              OPERATIVE REPORT   PREOPERATIVE DIAGNOSES:  Loose left total hip, loose acetabular component with broken acetabular screw, also osteopenia.  POSTOPERATIVE DIAGNOSES:  Loose left total hip, loose acetabular component with broken acetabular screw, also osteopenia.  COMPLICATIONS:  None.  INFECTIONS:  None.  OPERATION:  Left total hip revision, acetabular component, and bone grafting, Biomet implant.  SURGEON:  Myrtie Neither, MD  DESCRIPTION OF OPERATION:  The patient was taken to the operating room. After given adequate preop medications, given general anesthesia and was intubated.  Left hip was prepped with DuraPrep and draped in a sterile manner.  The patient was placed in left lateral position.  Posterior Southern approach was made going through the skin and subcutaneous tissue down to the fascia, down to the capsule.  Capsule was resected. The joint itself was evacuated.  Cultures for aerobic, anaerobic, and Gram stains were done.  Capsular resection was done.  Hip was dislocated with some difficulty because the acetabular component was covering along with the acetabular liner due to the loosening and separation of the screws.  Eventually, hip was dislocated, femoral head was removed, liner was removed, two of the screws were loose and removed, the third screw had broken, it was removed, and with some difficulty, the distal end of the broken screw was removed.  Acetabulum then was grasped with curettes followed by reaming starting with a 44-mm to remove soft tissue.  After adequate removal of soft tissue from the acetabulum down to good bone, reaming of the acetabulum was then done up  to 55 mm, this was down to good bleeding surface.  Trial implant was put in place and was found to seat very well.  A multi-holed porous-coated acetabular component was then selected, which was a 55-mm, this was obtained with the guide and impacted quite well.  Bone grafting was then put over about the acetabulum where the defect of the previous screw was put in was removed, also to reinforce further the acetabulum.  Four cancellous screws were able to be placed across the acetabular cup, holding it in good and stable position.  Trial liner was put in place and femoral head sizes were then tested.  Most stable with the best range of motion and leg length was the 36 head with +9 mm.  Range of motion with good flexion, good extension, good leg length, good internal and external rotation.  There was some difficulty with the acetabular liner due to the ring adjustment, but this was soft.  Finally, femoral head was put in place and acetabular liner with 10-degree posterior lip, this seated quite well and again range of motion was good with good leg length. Copious and abundant irrigation was done.  Hemostasis was done with the Bovie.  Wound closure was then done with the use of 1-0 and 0 for the  fascia, 2-0 for the subcutaneous, and skin staples for the skin.  Hip abduction pillow was applied.  Compressive dressing was applied.  The patient tolerated the procedure quite well, went to recovery room in stable and satisfactory condition.     Myrtie Neither, MD     AC/MEDQ  D:  05/02/2011  T:  05/03/2011  Job:  [id number]

## 2011-05-03 NOTE — Progress Notes (Signed)
PATIENT TEMP 99.3 LESS PAIN NOW, TOLERATRD OOB WELL TODAY. HAD LW BP 90/40 , BP MEDS HELD AND PCA DISCONTINUED. PRESENTLY BP IS STABLE. RECEIVED 2 UNITS OF PRBC , WIL REPEAT H&H BEFORE STARTING COUMADIN.continue pt partial wt.-bearing 50%.

## 2011-05-03 NOTE — Progress Notes (Signed)
Admit Complaint: 71 yo female with total hip revision  Pharmacist System-Based Medication Review:  Anticoagulation: INR 1.3; not on anticoag at home; coumadin score </= 3; Hbg down to 6.2.  Coumadin was held last night due to bleeding risk. RN said MD doesn't want dose tonight. 1) No Coumadin tonight per MD's request 2) PT/INR in am 3) Monitor Hgb closely.

## 2011-05-03 NOTE — Progress Notes (Signed)
UR COMPLETED  

## 2011-05-03 NOTE — Clinical Documentation Improvement (Signed)
Anemia Blood Loss Clarification  THIS DOCUMENT IS NOT A PERMANENT PART OF THE MEDICAL RECORD  RESPOND TO THE THIS QUERY, FOLLOW THE INSTRUCTIONS BELOW:  1. If needed, update documentation for the patient's encounter via the notes activity.  2. Access this query again and click edit on the In [employer].  3. After updating, or not, click F2 to complete all highlighted (required) fields concerning your review. Select "additional documentation in the medical record" OR "no additional documentation provided".  4. Click Sign note button.  5. The deficiency will fall out of your In Basket *Please let us know if you are not able to complete this workflow by phone or e-mail (listed below).        05/03/11  Dear Dr. Thelma Comp  Marton Redwood  In an effort to better capture your patient's severity of illness, reflect appropriate length of stay and utilization of resources, a review of the patient medical record has revealed the following indicators.    Based on your clinical judgment, please clarify and document in a progress note and/or discharge summary the clinical condition associated with the following supporting information:  In responding to this query please exercise your independent judgment.  The fact that a query is asked, does not imply that any particular answer is desired or expected. Please clarify secondary diagnosis if appropriate regarding drop in hgb from 11.6 preop to 6.2 postop also noted order to transfuse 2 units of PRBC on 05/03/11.  Thank you     Possible Clinical Conditions?   " Expected Acute Blood Loss Anemia  " Acute Blood Loss Anemia " Acute on chronic blood loss anemia  " Chronic blood loss anemia  " Precipitous drop in Hematocrit  " Other Condition________________  " Cannot Clinically Determine    Risk Factors: s/p left hip revision  With EBL 1200cc  Supporting Information:  Signs and Symptoms: on unit bp 86/45 and 104/52 post  op  Diagnostics:  Pre-OP H&H: 1/15 11.6/35.8  Postop/Current H&H: 1/18 6.2/18.4  Treatments:pending trf 2 units PRBC today 1/18  Transfusion: In surgery 350 ml  IV fluids / plasma expanders: Hextend 500 ml IV   And 2750 ml of IVF in surgery current D 51/2NS @ 75 ml/hr  Serial H&H monitoring: H + H ordered x 2 days  Medications: none  Review:ANSWERED IN DISCHARGE SUMMARY.  Thank You,  Lavonda Jumbo  Clinical Documentation Specialist RN, BSN, CDS:  Pager [phone] (M-F 8-430 pm)  Health Information Management [place]

## 2011-05-03 NOTE — Progress Notes (Signed)
Physical Therapy Evaluation Patient Details Name: Christina Peterson MRN: [id number] DOB: 1940/05/24 Today's Date: 05/03/2011  Problem List: There is no problem list on file for this patient.   Past Medical History:  Past Medical History  Diagnosis Date  . Arthritis   . Hypercholesteremia   . Hypertension     FOLLOWED BY DR Petra Kuba   Past Surgical History:  Past Surgical History  Procedure Date  . Joint replacement   . 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 Abdominal hysterectomy     PT Assessment/Plan/Recommendation PT Assessment Clinical Impression Statement: Pt. is pleasant lady with revision of left THR who will benefit from acute PT for maximizing independence of functional mobility and gait.  Pt. will likely need ST-SNF for rehab before home, however there is a possibility that pt's sister can come from [place] to stay with pt. depending on pt's  progress. PT Recommendation/Assessment: Patient will need skilled PT in the acute care venue PT Problem List: Decreased activity tolerance;Decreased mobility;Decreased knowledge of use of DME;Decreased knowledge of precautions;Pain Barriers to Discharge: Decreased caregiver support Barriers to Discharge Comments: unless pt. progresses well enough to DC home with sister's help PT Therapy Diagnosis : Difficulty walking;Acute pain PT Plan PT Frequency: 7X/week PT Treatment/Interventions: DME instruction;Gait training;Functional mobility training;Therapeutic activities;Therapeutic exercise;Patient/family education PT Recommendation Recommendations for Other Services: OT consult Follow Up Recommendations: Skilled nursing facility Equipment Recommended: Defer to next venue PT Goals  Acute Rehab PT Goals PT Goal Formulation: With patient Time For Goal Achievement: 7 days Pt will go Supine/Side to Sit: with supervision PT Goal: Supine/Side to Sit - Progress: Goal set today Pt will go Sit to Supine/Side: with supervision PT Goal:  Sit to Supine/Side - Progress: Goal set today Pt will go Sit to Stand: with supervision PT Goal: Sit to Stand - Progress: Goal set today Pt will go Stand to Sit: with supervision PT Goal: Stand to Sit - Progress: Goal set today Pt will Ambulate: 16 - 50 feet;with supervision;with rolling walker PT Goal: Ambulate - Progress: Goal set today Pt will Perform Home Exercise Program: with supervision, verbal cues required/provided PT Goal: Perform Home Exercise Program - Progress: Goal set today  PT Evaluation Precautions/Restrictions  Precautions Precautions: Posterior Hip Precaution Booklet Issued: Yes (comment) Precaution Comments: discussed/educated pt. omn posterior hip precautiona and posted in room Restrictions Weight Bearing Restrictions: Yes LLE Weight Bearing: Partial weight bearing (50%) LLE Partial Weight Bearing Percentage or Pounds: 50% Other Position/Activity Restrictions: avoid hip adduction, hip internal rotation and no hip flexion greater than 90 degrees Prior Functioning  Home Living Lives With: Alone Receives Help From: Family Type of Home:  Architect [place]) Home Layout:  (multilevel building with elevator) Home Access: Elevator Bathroom Shower/Tub: Tub/shower unit;Curtain Bathroom Toilet: Standard Home Adaptive Equipment: Environmental consultant - four wheeled;Straight cane;Grab bars in shower;Shower chair without back;Hand-held shower hose Prior Function Level of Independence: Requires assistive device for independence;Independent with basic ADLs;Independent with homemaking with ambulation;Independent with gait;Independent with transfers Driving: No Vocation: Retired Producer, television/film/video: Awake/alert Overall Cognitive Status: Appears within functional limits for tasks assessed Orientation Level: Oriented X4 Sensation/Coordination Sensation Light Touch: Appears Intact (both lower legs) Extremity Assessment   Mobility (including Balance) Bed Mobility Bed  Mobility: Yes Supine to Sit: 1: +2 Total assist;Patient percentage (comment) (pt. 50%) Supine to Sit Details (indicate cue type and reason): cues for technique and to maintain posterior hip precautions Sit to Supine: Not Tested (comment) Transfers Transfers: Yes Sit to Stand: 1: +2  Total assist;Patient percentage (comment) (pt. 60 %) Sit to Stand Details (indicate cue type and reason): vc's for technique, hip precautions and hand placement Stand to Sit: 1: +2 Total assist;Patient percentage (comment) (pt. 50%) Stand to Sit Details: cues for technique and to control descent Ambulation/Gait Ambulation/Gait: Yes Ambulation/Gait Assistance: 1: +2 Total assist;Patient percentage (comment) (pt. 60%) Ambulation/Gait Assistance Details (indicate cue type and reason): pt. needed cues for maintaining 50% PWB and for moving toward recliner chair Ambulation Distance (Feet): 2 Feet Assistive device: Rolling walker Gait Pattern: Step-to pattern;Decreased step length - right;Decreased step length - left (cues for technique and 50% PWB)    Exercise  Total Joint Exercises Ankle Circles/Pumps: AROM;Both;10 reps;Supine Quad Sets: AROM;Left;Supine End of Session PT - End of Session Equipment Utilized During Treatment: Gait belt (placed bed pillow between knees in chair) Activity Tolerance: Patient tolerated treatment well (asymptomatic for low BP and Hgb) Patient left: in chair;with call bell in reach;with family/visitor present Nurse Communication: Mobility status for transfers;Mobility status for ambulation;Weight bearing status General Behavior During Session: [facility] for tasks performed Cognition: [facility] for tasks performed  Ferman Hamming 05/03/2011, 4:17 PM Acute Rehabilitation Services [phone] (pager)

## 2011-05-03 NOTE — Progress Notes (Signed)
Foley d/c at 0650, she has not been able to void.  Bladder scan at 1400 showed 547 ml urine in bladder, I&O cath completed.  700 ml of urine emptied from bladder.

## 2011-05-04 ENCOUNTER — Other Ambulatory Visit: Payer: Self-pay | Admitting: Orthopedic Surgery

## 2011-05-04 LAB — HEMOGLOBIN AND HEMATOCRIT, BLOOD
HCT: 24.5 % — ABNORMAL LOW (ref 36.0–46.0)
Hemoglobin: 8.4 g/dL — ABNORMAL LOW (ref 12.0–15.0)

## 2011-05-04 MED ORDER — WARFARIN SODIUM 2.5 MG PO TABS
2.5000 mg | ORAL_TABLET | Freq: Once | ORAL | Status: AC
  Administered 2011-05-04: 2.5 mg via ORAL
  Filled 2011-05-04: qty 1

## 2011-05-04 MED ORDER — DOCUSATE SODIUM 100 MG PO CAPS: 100.0000 mg | ORAL_CAPSULE | Freq: Two times a day (BID) | ORAL | Status: AC

## 2011-05-04 NOTE — Progress Notes (Signed)
H&H STABLE AFEBRILE, dressing is dry, less pain, using spirometry well,

## 2011-05-04 NOTE — Progress Notes (Signed)
Physical Therapy Treatment Patient Details Name: Christina Peterson MRN: [id number] DOB: May 20, 1940 Today's Date: 05/04/2011  PT Assessment/Plan  PT - Assessment/Plan Comments on Treatment Session: Pt states she's feeling much better than yesterday.  Good improvement with mobility.   PT Plan: Discharge plan remains appropriate PT Frequency: 7X/week Follow Up Recommendations: Skilled nursing facility Equipment Recommended: Defer to next venue PT Goals  Acute Rehab PT Goals PT Goal: Supine/Side to Sit - Progress: Progressing toward goal PT Goal: Sit to Supine/Side - Progress: Met PT Goal: Sit to Stand - Progress: Progressing toward goal PT Goal: Stand to Sit - Progress: Progressing toward goal PT Goal: Ambulate - Progress: Progressing toward goal PT Goal: Perform Home Exercise Program - Progress: Progressing toward goal  PT Treatment Precautions/Restrictions  Precautions Precautions: Posterior Hip Precaution Booklet Issued: Yes (comment) Precaution Comments: Pt unable to recall 3/3 hip precautions without cueing.   Required Braces or Orthoses: No Restrictions Weight Bearing Restrictions: Yes LLE Weight Bearing: Partial weight bearing LLE Partial Weight Bearing Percentage or Pounds: 50% Other Position/Activity Restrictions: avoid hip adduction, hip internal rotation and no hip flexion greater than 90 degrees Mobility (including Balance) Bed Mobility Bed Mobility: No Supine to Sit: 4: Min assist;HOB flat;With rails Supine to Sit Details (indicate cue type and reason): (A) only needed at end when scooting hips to EOB.  Cues to reinforce hip precautions & technique.     Transfers Sit to Stand: Other (comment);From chair/3-in-1;From bed;With upper extremity assist (Min Guard) Sit to Stand Details (indicate cue type and reason): Cues for technique & hand placement.   Stand to Sit: Other (comment);With upper extremity assist;With armrests;To chair/3-in-1 [employer] (A)) Stand to Sit  Details: Cues for LLE positioning, hand placement & technique.   Ambulation/Gait Ambulation/Gait Assistance: Other (comment) (Min Guard (A)) Ambulation/Gait Assistance Details (indicate cue type and reason): (A) for safety.  Cues for 50% PWB.  Cues for sequencing,  Cues to increase/maintain upright posture.   Ambulation Distance (Feet): 70 Feet Assistive device: Rolling walker Gait Pattern: Antalgic;Decreased stance time - left;Decreased step length - right;Step-to pattern;Trunk flexed Stairs: No Wheelchair Mobility Wheelchair Mobility: No    Exercise  Total Joint Exercises Ankle Circles/Pumps: AROM;Both;10 reps;Supine Quad Sets: AROM;Strengthening;Both;10 reps;Supine Gluteal Sets: Strengthening;AROM;Both;10 reps;Supine Heel Slides: AAROM;Strengthening;Left;10 reps;Other (comment) (longsitting) Long Arc Quad: Strengthening;AROM;Left;10 reps;Seated  End of Session PT - End of Session Equipment Utilized During Treatment: Gait belt Activity Tolerance: Patient tolerated treatment well Patient left: in chair;with call bell in reach Nurse Communication: Mobility status for transfers;Mobility status for ambulation General Behavior During Session: [facility] for tasks performed Cognition: [facility] for tasks performed  Lara Mulch 05/04/2011, 2:15 PM [phone]

## 2011-05-04 NOTE — Progress Notes (Signed)
Occupational Therapy Evaluation Patient Details Name: Christina Peterson MRN: [id number] DOB: 24-Mar-1941 Today's Date: 05/04/2011  Problem List: There is no problem list on file for this patient.   Past Medical History:  Past Medical History  Diagnosis Date  . Arthritis   . Hypercholesteremia   . Hypertension     FOLLOWED BY DR Petra Kuba   Past Surgical History:  Past Surgical History  Procedure Date  . Joint replacement   . 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 Abdominal hysterectomy     OT Assessment/Plan/Recommendation OT Assessment Clinical Impression Statement: Pt 71 y o s/p L hip revision. PWB 50%. Post hip precautions. Pt will bnenfit from skilled OT services in acute setting to max indep and safety with ADL and functional mobility with ADL with nec AE and DME and to reach below established goals. Pt plans to undergo rehab at SNF prior to D/C home to live independently. Discussed POC with pt. OT Recommendation/Assessment: Patient will need skilled OT in the acute care venue OT Problem List: Decreased strength;Impaired balance (sitting and/or standing);Decreased range of motion;Decreased knowledge of use of DME or AE;Decreased knowledge of precautions;Pain Barriers to Discharge: Decreased caregiver support OT Therapy Diagnosis : Generalized weakness OT Plan OT Frequency: Min 1X/week OT Treatment/Interventions: Self-care/ADL training;Therapeutic exercise;Energy conservation;DME and/or AE instruction;Therapeutic activities;Patient/family education;Balance training OT Recommendation Follow Up Recommendations: Skilled nursing facility Equipment Recommended: Defer to next venue Individuals Consulted Consulted and Agree with Results and Recommendations: Patient OT Goals Acute Rehab OT Goals OT Goal Formulation: With patient Time For Goal Achievement: 2 weeks ADL Goals Pt Will Perform Lower Body Bathing: with supervision;Sit to stand from chair;with adaptive equipment;with  cueing (comment type and amount) Pt Will Perform Lower Body Dressing: with supervision;Sit to stand from chair;with adaptive equipment;with cueing (comment type and amount) ADL Goal: Lower Body Dressing - Progress: Goal set today Pt Will Transfer to Toilet: with supervision;3-in-1;with cueing (comment type and amount) ADL Goal: Toilet Transfer - Progress: Goal set today Pt Will Perform Toileting - Clothing Manipulation: Independently;Standing ADL Goal: Toileting - Clothing Manipulation - Progress: Goal set today Pt Will Perform Toileting - Hygiene: Independently;Standing at 3-in-1/toilet ADL Goal: Toileting - Hygiene - Progress: Goal set today Pt Will Perform Tub/Shower Transfer: with min assist;with DME;Maintaining hip precautions;with cueing (comment type and amount) ADL Goal: Tub/Shower Transfer - Progress: Goal set today Additional ADL Goal #1: Demonstrate 3/3 hip precuations during ADLtask with min vc ADL Goal: Additional Goal #1 - Progress: Goal set today  OT Evaluation Precautions/Restrictions  Precautions Precautions: Posterior Hip Precaution Booklet Issued: Yes (comment) Precaution Comments: Pt unable to recall 3/3 hip precautions without cueing.   Required Braces or Orthoses: No Restrictions Weight Bearing Restrictions: Yes LLE Weight Bearing: Partial weight bearing LLE Partial Weight Bearing Percentage or Pounds: 50% Other Position/Activity Restrictions: avoid hip adduction, hip internal rotation and no hip flexion greater than 90 degrees Prior Functioning Home Living Lives With: Alone Receives Help From: Family Type of Home: Apartment Home Layout: One level Home Access: Elevator Bathroom Shower/Tub: Tub/shower unit;Curtain Firefighter: Standard Bathroom Accessibility: Yes How Accessible: Accessible via walker Home Adaptive Equipment: Walker - four wheeled;Straight cane;Grab bars in shower;Shower chair without back;Hand-held shower hose Prior Function Level of  Independence: Requires assistive device for independence;Independent with basic ADLs;Independent with homemaking with ambulation;Independent with gait;Independent with transfers Driving: No Vocation: Retired ADL ADL Eating/Feeding: Performed;Independent Where Assessed - Eating/Feeding: Chair Grooming: Set up;Performed Where Assessed - Grooming: Sitting, chair Upper Body Bathing: Chest;Right arm;Left  arm;Abdomen;Simulated;Set up Where Assessed - Upper Body Bathing: Sitting, chair Lower Body Bathing: Maximal assistance Where Assessed - Lower Body Bathing: Sit to stand from chair Upper Body Dressing: Set up;Simulated Where Assessed - Upper Body Dressing: Sitting, chair Lower Body Dressing: Simulated;Maximal assistance Where Assessed - Lower Body Dressing: Sit to stand from chair Toilet Transfer: Moderate assistance Toilet Transfer Method: Proofreader: Set designer - Clothing Manipulation: Moderate assistance Toileting - Clothing Manipulation Details (indicate cue type and reason):  (vc to maintain WBS) Where Assessed - Toileting Clothing Manipulation: Standing Toileting - Hygiene: Independent Where Assessed - Toileting Hygiene: Standing Tub/Shower Transfer: Not assessed Equipment Used: Reacher;Sock aid (needs reacher and LHS) Ambulation Related to ADLs: Min A. vc for WBS ADL Comments: Will increae indep with AE and DME Vision/Perception  Vision - History Baseline Vision: Wears glasses all the time Patient Visual Report: No change from baseline Perception Perception: Within Functional Limits Praxis Praxis: Intact Cognition Cognition Arousal/Alertness: Awake/alert Overall Cognitive Status: Appears within functional limits for tasks assessed Orientation Level: Oriented X4 Sensation/Coordination Sensation Light Touch: Appears Intact Coordination Gross Motor Movements are Fluid and Coordinated: Yes Fine Motor Movements are Fluid and  Coordinated: Yes Extremity Assessment RUE Assessment RUE Assessment: Within Functional Limits LUE Assessment LUE Assessment: Within Functional Limits Mobility  Bed Mobility Bed Mobility: No Supine to Sit: 4: Min assist;HOB flat;With rails Supine to Sit Details (indicate cue type and reason): (A) only needed at end when scooting hips to EOB.  Cues to reinforce hip precautions & technique.   Sit to Supine: 5: Supervision;HOB flat Sit to Supine - Details (indicate cue type and reason): Cues to reinforce no internal rotation of LLE.   Transfers Transfers: Yes Sit to Stand: Other (comment);From chair/3-in-1;From bed;With upper extremity assist (Min Guard) Sit to Stand Details (indicate cue type and reason): Cues for technique & hand placement.   Stand to Sit: Other (comment);With upper extremity assist;With armrests;To chair/3-in-1 [employer] (A)) Stand to Sit Details: Cues for LLE positioning, hand placement & technique.   Exercises Total Joint Exercises Ankle Circles/Pumps: AROM;Both;10 reps;Supine Quad Sets: AROM;Strengthening;Both;10 reps;Supine Gluteal Sets: Strengthening;AROM;Both;10 reps;Supine Heel Slides: AAROM;Strengthening;Left;10 reps;Other (comment) (longsitting) Hip ABduction/ADduction: AAROM;Strengthening;Left;10 reps;Supine Straight Leg Raises: Strengthening;AROM;Left;10 reps;Supine Long Arc Quad: Strengthening;AROM;Left;10 reps;Seated End of Session OT - End of Session Equipment Utilized During Treatment: Gait belt Activity Tolerance: Patient tolerated treatment well Patient left: in chair;with call bell in reach General Behavior During Session: [facility] for tasks performed Cognition: [facility] for tasks performed   Christina Peterson,Christina Peterson 05/04/2011, 2:25 PM  [facility], OTR/L  [phone] 05/04/2011

## 2011-05-04 NOTE — Progress Notes (Signed)
ANTICOAGULATION CONSULT NOTE - Follow Up Consult  Pharmacy Consult for Coumadin Indication: VTE prophylaxis  No Known Allergies  Patient Measurements: Height: 5' (152.4 cm) Weight: 126 lb 14.4 oz (57.561 kg) IBW/kg (Calculated) : 45.5   Vital Signs: Temp: 99.4 F (37.4 C) (01/19 1354) Temp src: Oral (01/19 1354) BP: 95/47 mmHg (01/19 1354) Pulse Rate: 86  (01/19 1354)  Labs:  Basename 05/04/11 0645 05/03/11 1922 05/03/11 0939 05/03/11 0605  HGB 8.4* 8.8* -- --  HCT 24.5* 25.3* 18.4* --  PLT -- -- -- --  APTT -- -- -- --  LABPROT 16.1* -- -- 16.4*  INR 1.26 -- -- 1.30  HEPARINUNFRC -- -- -- --  CREATININE -- -- -- --  CKTOTAL -- -- -- --  CKMB -- -- -- --  TROPONINI -- -- -- --   Estimated Creatinine Clearance: 51.2 ml/min (by C-G formula based on Cr of 0.68).   Medications:  Prescriptions prior to admission  Medication Sig Dispense Refill  . aspirin (ASPIRIN EC) 81 MG EC tablet Take 81 mg by mouth daily. Swallow whole.       . benazepril-hydrochlorthiazide (LOTENSIN HCT) 20-12.5 MG per tablet Take 1 tablet by mouth daily.        [name] Kitchen BLACK COHOSH EXTRACT PO Take 1 capsule by mouth daily as needed. For hot flashes.      . Calcium Citrate-Vitamin D (CITRACAL MAXIMUM PO) Take 2 tablets by mouth daily.       . cilostazol (PLETAL) 100 MG tablet Take 100 mg by mouth 2 (two) times daily.       . Cyanocobalamin (VITAMIN B12 PO) Take by mouth 3 (three) times a week. Pt unsure of exactly how much she takes at a time, it is the liquid form.       . etodolac (LODINE XL) 400 MG 24 hr tablet Take 400 mg by mouth 2 (two) times daily.       [name] Kitchen HYDROcodone-acetaminophen (LORCET) 10-650 MG per tablet Take 0.5-1 tablets by mouth 2 (two) times daily as needed. For pain.      [name] Kitchen omega-3 acid ethyl esters (LOVAZA) 1 G capsule Take 1 g by mouth daily.        . simvastatin (ZOCOR) 10 MG tablet Take 10 mg by mouth at bedtime.          Assessment: Pt to start warfarin post-op, but was not  started yesterday secondary to significant drop in Hg.  Hg improved today and stable after PRBC.  Will start warfarin today.  Goal of Therapy:  INR 2-3   Plan:  1) Coumadin 2.5mg  po x 1 tonight 2) F/U daily INR  Christina Peterson, [facility] 05/04/2011,3:36 PM

## 2011-05-04 NOTE — Progress Notes (Signed)
Physical Therapy Treatment Patient Details Name: Christina Peterson MRN: [id number] DOB: [birth year] Today's Date: 05/04/2011  PT Assessment/Plan  PT - Assessment/Plan Comments on Treatment Session: Pt making good progress with PT goals.   Very motivated & willing to participate despite just returning to bed.  Due to pt living by herself & will not have (A) at home, she would continue to benefit from ST-SNF prior to d/c home to maximize strength, balance, independence with functional mobility.   PT Plan: Discharge plan remains appropriate PT Frequency: 7X/week Follow Up Recommendations: Skilled nursing facility Equipment Recommended: Defer to next venue PT Goals  Acute Rehab PT Goals PT Goal: Supine/Side to Sit - Progress: Met PT Goal: Sit to Supine/Side - Progress: Met PT Goal: Sit to Stand - Progress: Progressing toward goal PT Goal: Stand to Sit - Progress: Progressing toward goal PT Goal: Ambulate - Progress: Progressing toward goal PT Goal: Perform Home Exercise Program - Progress: Progressing toward goal  PT Treatment Precautions/Restrictions  Precautions Precautions: Posterior Hip Precaution Booklet Issued: Yes (comment) Precaution Comments: Pt able to recall 1/3 hip precautions.  Reviewed all 3 precautions.   Required Braces or Orthoses: No Restrictions Weight Bearing Restrictions: Yes LLE Weight Bearing: Partial weight bearing LLE Partial Weight Bearing Percentage or Pounds: 50% Other Position/Activity Restrictions: avoid hip adduction, hip internal rotation and no hip flexion greater than 90 degrees Mobility (including Balance) Bed Mobility Bed Mobility: No Supine to Sit: 6: Modified independent (Device/Increase time);HOB flat Supine to Sit Details (indicate cue type and reason): Increased time.   Sit to Supine: 5: Supervision;HOB flat Sit to Supine - Details (indicate cue type and reason): Cues to reinforce no internal rotation of LLE.   Transfers Sit to Stand: 5:  Supervision;From bed;With upper extremity assist Sit to Stand Details (indicate cue type and reason): Cues for hand placement  Stand to Sit: 5: Supervision;To bed;With upper extremity assist Stand to Sit Details: Cues for hand placement Ambulation/Gait Ambulation/Gait Assistance: Other (comment) (Min Guard (A)) Ambulation/Gait Assistance Details (indicate cue type and reason): Guarding for safety.  does well with adhering to wt bearing status.  Cues to increase step height LLE.   Ambulation Distance (Feet): 120 Feet Assistive device: Rolling walker Gait Pattern: Step-to pattern;Decreased step length - left;Decreased stance time - left Stairs: No Wheelchair Mobility Wheelchair Mobility: No    Exercise  Total Joint Exercises Ankle Circles/Pumps: AROM;Both;10 reps;Supine Quad Sets: AROM;Strengthening;10 reps;Both;Supine Gluteal Sets: Strengthening;AROM;Both;10 reps;Supine Heel Slides: AAROM;Strengthening;Left;10 reps;Supine Hip ABduction/ADduction: AAROM;Strengthening;Left;10 reps;Supine Straight Leg Raises: Strengthening;AROM;Left;10 reps;Supine End of Session PT - End of Session Equipment Utilized During Treatment: Gait belt Activity Tolerance: Patient tolerated treatment well Patient left: in bed;with call bell in reach;with family/visitor present Nurse Communication: Mobility status for ambulation;Mobility status for transfers General Behavior During Session: [facility] for tasks performed Cognition: [facility] for tasks performed  Lara Mulch 05/04/2011, 2:01 PM [phone]

## 2011-05-05 LAB — POCT I-STAT 4, (NA,K, GLUC, HGB,HCT): Potassium: 3.9 mEq/L (ref 3.5–5.1)

## 2011-05-05 LAB — PROTIME-INR
INR: 1.22 (ref 0.00–1.49)
Prothrombin Time: 15.7 seconds — ABNORMAL HIGH (ref 11.6–15.2)

## 2011-05-05 MED ORDER — DIPHENHYDRAMINE HCL 25 MG PO CAPS
25.0000 mg | ORAL_CAPSULE | Freq: Four times a day (QID) | ORAL | Status: DC | PRN
  Administered 2011-05-05 – 2011-05-06 (×2): 25 mg via ORAL
  Filled 2011-05-05 (×2): qty 1

## 2011-05-05 MED ORDER — DOCUSATE SODIUM 100 MG PO CAPS
100.0000 mg | ORAL_CAPSULE | Freq: Two times a day (BID) | ORAL | Status: DC
  Administered 2011-05-05 – 2011-05-07 (×4): 100 mg via ORAL
  Filled 2011-05-05 (×5): qty 1

## 2011-05-05 MED ORDER — WARFARIN SODIUM 2.5 MG PO TABS
2.5000 mg | ORAL_TABLET | Freq: Once | ORAL | Status: AC
  Administered 2011-05-05: 2.5 mg via ORAL
  Filled 2011-05-05: qty 1

## 2011-05-05 MED ORDER — SENNOSIDES-DOCUSATE SODIUM 8.6-50 MG PO TABS
2.0000 | ORAL_TABLET | Freq: Once | ORAL | Status: AC
  Administered 2011-05-05: 2 via ORAL
  Filled 2011-05-05: qty 2

## 2011-05-05 NOTE — Progress Notes (Signed)
ANTICOAGULATION CONSULT NOTE - Follow Up Consult  Pharmacy Consult for Coumadin Indication: VTE prophylaxis  No Known Allergies  Patient Measurements: Height: 5' (152.4 cm) Weight: 126 lb 14.4 oz (57.561 kg) IBW/kg (Calculated) : 45.5   Vital Signs: Temp: 98.1 F (36.7 C) (01/20 0536) Temp src: Oral (01/20 0536) BP: 110/47 mmHg (01/20 0536) Pulse Rate: 92  (01/20 0536)  Labs:  Alvira Philips 05/05/11 0715 05/04/11 0645 05/03/11 1922 05/03/11 0939 05/03/11 0605  HGB -- 8.4* 8.8* -- --  HCT -- 24.5* 25.3* 18.4* --  PLT -- -- -- -- --  APTT -- -- -- -- --  LABPROT 15.7* 16.1* -- -- 16.4*  INR 1.22 1.26 -- -- 1.30  HEPARINUNFRC -- -- -- -- --  CREATININE -- -- -- -- --  CKTOTAL -- -- -- -- --  CKMB -- -- -- -- --  TROPONINI -- -- -- -- --   Estimated Creatinine Clearance: 51.2 ml/min (by C-G formula based on Cr of 0.68).   Medications:  Prescriptions prior to admission  Medication Sig Dispense Refill  . aspirin (ASPIRIN EC) 81 MG EC tablet Take 81 mg by mouth daily. Swallow whole.       . benazepril-hydrochlorthiazide (LOTENSIN HCT) 20-12.5 MG per tablet Take 1 tablet by mouth daily.        [name] Kitchen BLACK COHOSH EXTRACT PO Take 1 capsule by mouth daily as needed. For hot flashes.      . Calcium Citrate-Vitamin D (CITRACAL MAXIMUM PO) Take 2 tablets by mouth daily.       . cilostazol (PLETAL) 100 MG tablet Take 100 mg by mouth 2 (two) times daily.       . Cyanocobalamin (VITAMIN B12 PO) Take by mouth 3 (three) times a week. Pt unsure of exactly how much she takes at a time, it is the liquid form.       . etodolac (LODINE XL) 400 MG 24 hr tablet Take 400 mg by mouth 2 (two) times daily.       [name] Kitchen HYDROcodone-acetaminophen (LORCET) 10-650 MG per tablet Take 0.5-1 tablets by mouth 2 (two) times daily as needed. For pain.      [name] Kitchen omega-3 acid ethyl esters (LOVAZA) 1 G capsule Take 1 g by mouth daily.        . simvastatin (ZOCOR) 10 MG tablet Take 10 mg by mouth at bedtime.           Assessment: INR at baseline s/p single warfarin dose of 2.5mg . Delayed start of warfarin post-op secondary to significant drop in Hg and transfusion.  No Hg reported today.   Goal of Therapy:  INR 2-3   Plan:  1) Continue Coumadin 2.5mg  po x 1 tonight 2) F/U daily INR  Rojean Ige, [facility] 05/05/2011,11:03 AM

## 2011-05-05 NOTE — Progress Notes (Signed)
Physical Therapy Treatment Patient Details Name: Christina Peterson MRN: [id number] DOB: [birth year] Today's Date: 05/05/2011  PT Assessment/Plan  PT - Assessment/Plan Comments on Treatment Session: continuing good progress PT Plan: Discharge plan remains appropriate PT Frequency: 7X/week Follow Up Recommendations: Skilled nursing facility Equipment Recommended: Defer to next venue PT Goals  Acute Rehab PT Goals Time For Goal Achievement: 7 days Pt will go Supine/Side to Sit: with supervision PT Goal: Supine/Side to Sit - Progress: Progressing toward goal Pt will go Sit to Supine/Side: with supervision PT Goal: Sit to Supine/Side - Progress: Progressing toward goal Pt will go Sit to Stand: with supervision PT Goal: Sit to Stand - Progress: Progressing toward goal Pt will go Stand to Sit: with supervision PT Goal: Stand to Sit - Progress: Progressing toward goal Pt will Ambulate: 16 - 50 feet;with supervision;with rolling walker PT Goal: Ambulate - Progress: Progressing toward goal (will consider increasing distance goal next session) Pt will Perform Home Exercise Program: with supervision, verbal cues required/provided PT Goal: Perform Home Exercise Program - Progress: Progressing toward goal  PT Treatment Precautions/Restrictions  Precautions Precautions: Posterior Hip Precaution Booklet Issued: Yes (comment) Precaution Comments: Pt unable to recall 3/3 hip precautions without cueing.   Required Braces or Orthoses: No Restrictions Weight Bearing Restrictions: Yes LLE Weight Bearing: Partial weight bearing LLE Partial Weight Bearing Percentage or Pounds: 50 Other Position/Activity Restrictions: avoid hip adduction, hip internal rotation and no hip flexion greater than 90 degrees Mobility (including Balance) Bed Mobility Bed Mobility: Yes Supine to Sit: 4: Min assist (without physical contact) Supine to Sit Details (indicate cue type and reason): cues for post hip prec Sit to  Supine: 5: Supervision;HOB flat Sit to Supine - Details (indicate cue type and reason): cues for post hip prec Transfers Sit to Stand: 4: Min assist (with and without physical contact) Sit to Stand Details (indicate cue type and reason): cue for post hip prec Stand to Sit: 4: Min assist (with and without physical contact) Stand to Sit Details: cues for post hip prec and control of descent Ambulation/Gait Ambulation/Gait Assistance: 4: Min assist (without physical contact) Ambulation/Gait Assistance Details (indicate cue type and reason): cues for self-monitor for activity tol; cues for post hip prec with turns; distance limited by fatigue -- pt requested recliner and to sit down prior to arriving at room; was able to wait for recliner to be brought to her Ambulation Distance (Feet): 110 Feet Assistive device: Rolling walker Gait Pattern: Step-to pattern (emerging step-through)    Exercise  Total Joint Exercises Gluteal Sets: Strengthening;AROM;Both;10 reps;Supine Towel Squeeze: AROM;Left;10 reps;Supine (monitored for hip internal rotation) Short Arc Quad: AROM;Left;10 reps;Supine Heel Slides: AAROM;Left;10 reps;Supine (progressing to AROM) Hip ABduction/ADduction: AAROM;Strengthening;Left;10 reps;Supine End of Session PT - End of Session Activity Tolerance: Patient limited by fatigue (still a good distance) Patient left: in chair;with call bell in reach General Behavior During Session: [facility] for tasks performed Cognition: [facility] for tasks performed  Van Clines [facility] [place], [place] [phone]  05/05/2011, 10:11 AM

## 2011-05-05 NOTE — Progress Notes (Signed)
Physical Therapy Treatment Patient Details Name: Christina Peterson MRN: [id number] DOB: [birth year] Today's Date: 05/05/2011  PT Assessment/Plan  PT - Assessment/Plan Comments on Treatment Session: continuing good progress PT Plan: Discharge plan remains appropriate PT Frequency: 7X/week Follow Up Recommendations: Skilled nursing facility Equipment Recommended: Defer to next venue PT Goals  Acute Rehab PT Goals Time For Goal Achievement: 7 days Pt will go Supine/Side to Sit: with supervision PT Goal: Supine/Side to Sit - Progress: Progressing toward goal Pt will go Sit to Supine/Side: with supervision PT Goal: Sit to Supine/Side - Progress: Progressing toward goal Pt will go Sit to Stand: with supervision PT Goal: Sit to Stand - Progress: Progressing toward goal Pt will go Stand to Sit: with supervision PT Goal: Stand to Sit - Progress: Progressing toward goal Pt will Ambulate: with supervision;with rolling walker;>150 feet PT Goal: Ambulate - Progress: Goal set today Pt will Perform Home Exercise Program: with supervision, verbal cues required/provided PT Goal: Perform Home Exercise Program - Progress: Progressing toward goal  PT Treatment Precautions/Restrictions  Precautions Precautions: Posterior Hip Precaution Booklet Issued: Yes (comment) Precaution Comments: Pt able to recall 3/3 post hip prec and 50%PWB Required Braces or Orthoses: No Restrictions Weight Bearing Restrictions: Yes LLE Weight Bearing: Partial weight bearing LLE Partial Weight Bearing Percentage or Pounds: 50 Other Position/Activity Restrictions: avoid hip adduction, hip internal rotation and no hip flexion greater than 90 degrees Mobility (including Balance) Bed Mobility Supine to Sit: 4: Min assist Supine to Sit Details (indicate cue type and reason): cues for post hip prec Sit to Supine: 5: Supervision;HOB flat Sit to Supine - Details (indicate cue type and reason): cues for hip prec Transfers Sit to  Stand: 4: Min assist Sit to Stand Details (indicate cue type and reason): cues for hip prec and PWB Stand to Sit: 4: Min assist Stand to Sit Details: cues for hip prec and to control descent Ambulation/Gait Ambulation/Gait Assistance: 5: Supervision Ambulation/Gait Assistance Details (indicate cue type and reason): better activity tolerance; cues for post hip prec wiht turns Ambulation Distance (Feet): 0 Feet Assistive device: Rolling walker Gait Pattern: Step-to pattern       End of Session PT - End of Session Activity Tolerance: Patient tolerated treatment well Patient left: with call bell in reach (on 3in1 in bathroom; RN aware) General Behavior During Session: [facility] for tasks performed Cognition: [facility] for tasks performed  Van Clines [facility] [place], [place] [phone]  05/05/2011, 5:38 PM

## 2011-05-05 NOTE — Progress Notes (Signed)
CSW met with pt RE: PT/OT recommendation for SNF. Pt reports previous stay at [facility] and prefers them for this SNF stay. Full CSW eval and FL2 on chart for MD signature. Will f/u with offers.  Dellie Burns, MSW, [place] [phone]

## 2011-05-05 NOTE — Progress Notes (Signed)
Afebrile, pain under control,dressing changed,wound healing well.Tolerated PTwell, no bm yet, but no bloating.

## 2011-05-06 ENCOUNTER — Other Ambulatory Visit: Payer: Self-pay | Admitting: Orthopedic Surgery

## 2011-05-06 LAB — HEMOGLOBIN AND HEMATOCRIT, BLOOD
HCT: 21.4 % — ABNORMAL LOW (ref 36.0–46.0)
HCT: 27.5 % — ABNORMAL LOW (ref 36.0–46.0)
Hemoglobin: 9.4 g/dL — ABNORMAL LOW (ref 12.0–15.0)

## 2011-05-06 LAB — TYPE AND SCREEN: ABO/RH(D): O POS

## 2011-05-06 LAB — PREPARE RBC (CROSSMATCH)

## 2011-05-06 LAB — PROTIME-INR
INR: 2.34 — ABNORMAL HIGH (ref 0.00–1.49)
Prothrombin Time: 26 seconds — ABNORMAL HIGH (ref 11.6–15.2)

## 2011-05-06 MED ORDER — WHITE PETROLATUM GEL
Status: AC
  Administered 2011-05-06: 23:00:00
  Filled 2011-05-06: qty 5

## 2011-05-06 NOTE — Progress Notes (Signed)
Physical Therapy Treatment Patient Details Name: Christina Peterson MRN: [id number] DOB: Jul 20, 1940 Today's Date: 05/06/2011  PT Assessment/Plan  PT - Assessment/Plan Comments on Treatment Session: continuing good progress PT Plan: Discharge plan remains appropriate PT Frequency: 7X/week Recommendations for Other Services: OT consult Follow Up Recommendations: Skilled nursing facility Equipment Recommended: Defer to next venue PT Goals  Acute Rehab PT Goals Time For Goal Achievement: 7 days Pt will go Supine/Side to Sit: with supervision PT Goal: Supine/Side to Sit - Progress: Other (comment) Pt will go Sit to Supine/Side: with supervision PT Goal: Sit to Supine/Side - Progress: Other (comment) Pt will go Sit to Stand: with supervision PT Goal: Sit to Stand - Progress: Progressing toward goal Pt will go Stand to Sit: with supervision PT Goal: Stand to Sit - Progress: Progressing toward goal PT Goal: Ambulate - Progress: Progressing toward goal Pt will Perform Home Exercise Program: with supervision, verbal cues required/provided PT Goal: Perform Home Exercise Program - Progress: Other (comment)  PT Treatment Precautions/Restrictions  Precautions Precautions: Posterior Hip Precaution Booklet Issued: Yes (comment) Precaution Comments: Pt able to recall 3/3 post hip prec and 50%PWB Required Braces or Orthoses: No Restrictions Weight Bearing Restrictions: Yes LLE Weight Bearing: Partial weight bearing LLE Partial Weight Bearing Percentage or Pounds: 50 Other Position/Activity Restrictions: avoid hip adduction, hip internal rotation and no hip flexion greater than 90 degrees Mobility (including Balance) Transfers Sit to Stand: 4: Min assist;With armrests;From chair/3-in-1 Sit to Stand Details (indicate cue type and reason): cues for hip prec Stand to Sit: 4: Min assist;To chair/3-in-1;With upper extremity assist Stand to Sit Details: cues for post hip  prec Ambulation/Gait Ambulation/Gait Assistance: 5: Supervision Ambulation/Gait Assistance Details (indicate cue type and reason): cues for self monitor for activity tol (Hgb low) Ambulation Distance (Feet): 100 Feet Assistive device: Rolling walker       End of Session PT - End of Session Activity Tolerance: Patient tolerated treatment well Patient left: with call bell in reach (on 3in1 in bathroom) Nurse Communication: Mobility status for transfers;Mobility status for ambulation General Behavior During Session: [facility] for tasks performed Cognition: [facility] for tasks performed  Van Clines [facility] [place], [place] [phone]  05/06/2011, 2:45 PM

## 2011-05-06 NOTE — Progress Notes (Signed)
ANTICOAGULATION CONSULT NOTE - Follow Up Consult  Pharmacy Consult for Coumadin Indication: VTE prophylaxis  No Known Allergies  Patient Measurements: Height: 5' (152.4 cm) Weight: 126 lb 14.4 oz (57.561 kg) IBW/kg (Calculated) : 45.5    Vital Signs: Temp: 98.2 F (36.8 C) (01/21 0515) Temp src: Oral (01/21 0515) BP: 109/47 mmHg (01/21 0515) Pulse Rate: 74  (01/21 0515)  Labs:  Alvira Philips 05/06/11 0653 05/05/11 0715 05/04/11 0645 05/03/11 1922  HGB 7.2* -- 8.4* --  HCT 21.4* -- 24.5* 25.3*  PLT -- -- -- --  APTT -- -- -- --  LABPROT 26.0* 15.7* 16.1* --  INR 2.34* 1.22 1.26 --  HEPARINUNFRC -- -- -- --  CREATININE -- -- -- --  CKTOTAL -- -- -- --  CKMB -- -- -- --  TROPONINI -- -- -- --   Estimated Creatinine Clearance: 51.2 ml/min (by C-G formula based on Cr of 0.68).   Medications:  Scheduled:    . benazepril  20 mg Oral Daily   And  . hydrochlorothiazide  12.5 mg Oral Daily  . cilostazol  100 mg Oral BID  . docusate sodium  100 mg Oral BID  . omega-3 acid ethyl esters  1 g Oral Daily  . senna-docusate  2 tablet Oral Once  . simvastatin  10 mg Oral QHS  . warfarin  2.5 mg Oral ONCE-1800  . warfarin   Does not apply Once    Assessment: 71yo female s/p L-Total Hip Revision.  INR with large jump today after 2nd dose of Coumadin.  No problems noted.  Hg 7.2- down from post-transfusion value of 8.4.  Goal of Therapy:  INR 2-3   Plan:  No Coumadin today  Juron Vorhees P 05/06/2011,10:32 AM

## 2011-05-06 NOTE — Progress Notes (Signed)
AFEBRILE, H&H LOW  RECEIVING 2 UNITS OF PRBC 'S, BP  IS STABLE., PAIN UNDER CONTROL. PLAN DISCHARGE TOMORROW IF H&H IS STABLE.

## 2011-05-06 NOTE — Progress Notes (Signed)
Clinical social worker provided pt with bed offers, and pt plans to [facility] when medically stable. [facility] will complete paperwork with pt tomorrow morning before patient anticipated dc tomorrow 05/07/2011. [name] KitchenClinical social worker continuing to follow pt to assist with pt dc plans and further csw needs.    Catha Gosselin, Theresia Majors  [phone] .05/06/2011 14:30pm

## 2011-05-07 ENCOUNTER — Other Ambulatory Visit: Payer: Self-pay | Admitting: Orthopedic Surgery

## 2011-05-07 MED ORDER — METHOCARBAMOL 500 MG PO TABS: 500.0000 mg | ORAL_TABLET | Freq: Four times a day (QID) | ORAL | Status: AC | PRN

## 2011-05-07 MED ORDER — WARFARIN 0.5 MG HALF TABLET
1.5000 mg | ORAL_TABLET | Freq: Once | ORAL | Status: DC
  Administered 2011-05-07: 1.5 mg via ORAL
  Filled 2011-05-07: qty 1

## 2011-05-07 MED ORDER — WARFARIN SODIUM 1 MG PO TABS: 5.0000 mg | ORAL_TABLET | Freq: Every day | ORAL | Status: DC

## 2011-05-07 MED ORDER — OXYCODONE-ACETAMINOPHEN 5-325 MG PO TABS: 1.0000 | ORAL_TABLET | ORAL | Status: AC | PRN

## 2011-05-07 MED ORDER — DSS 100 MG PO CAPS: 100.0000 mg | ORAL_CAPSULE | Freq: Two times a day (BID) | ORAL | Status: AC

## 2011-05-07 NOTE — Progress Notes (Signed)
Physical Therapy Treatment Patient Details Name: Christina Peterson MRN: [id number] DOB: 15-Dec-1940 Today's Date: 05/07/2011  PT Assessment/Plan  PT - Assessment/Plan Comments on Treatment Session: anticipate dc to SNF today PT Plan: Discharge plan remains appropriate PT Frequency: 7X/week Follow Up Recommendations: Skilled nursing facility Equipment Recommended: Defer to next venue PT Goals  Acute Rehab PT Goals Time For Goal Achievement: 7 days Pt will go Supine/Side to Sit: with supervision PT Goal: Supine/Side to Sit - Progress: Progressing toward goal Pt will go Sit to Supine/Side: with supervision PT Goal: Sit to Supine/Side - Progress: Progressing toward goal Pt will go Sit to Stand: with supervision PT Goal: Sit to Stand - Progress: Partly met Pt will go Stand to Sit: with supervision PT Goal: Stand to Sit - Progress: Partly met Pt will Ambulate: with supervision;with rolling walker;>150 feet PT Goal: Ambulate - Progress: Progressing toward goal Pt will Perform Home Exercise Program: with supervision, verbal cues required/provided PT Goal: Perform Home Exercise Program - Progress: Progressing toward goal  PT Treatment Precautions/Restrictions  Precautions Precautions: Posterior Hip Precaution Booklet Issued: Yes (comment) Precaution Comments: Pt able to recall 3/3 post hip prec and 50%PWB Required Braces or Orthoses: No Restrictions Weight Bearing Restrictions: Yes LLE Weight Bearing: Partial weight bearing LLE Partial Weight Bearing Percentage or Pounds: 50 Other Position/Activity Restrictions: avoid hip adduction, hip internal rotation and no hip flexion greater than 90 degrees Mobility (including Balance) Bed Mobility Supine to Sit: 5: Supervision;HOB flat Supine to Sit Details (indicate cue type and reason): good, smooth transition Sit to Supine: 4: Min assist Sit to Supine - Details (indicate cue type and reason): min assist for LEs; good observance of post hip  prec Transfers Sit to Stand: 5: Supervision Sit to Stand Details (indicate cue type and reason): cues for safety/prec Stand to Sit: 5: Supervision;To bed Stand to Sit Details: cues for hip prec and hand paocenment Ambulation/Gait Ambulation/Gait Assistance: 5: Supervision Ambulation/Gait Assistance Details (indicate cue type and reason): good maintenance of 50%PWB Ambulation Distance (Feet): 80 Feet Assistive device: Rolling walker Gait Pattern: Step-to pattern    Exercise  Total Joint Exercises Ankle Circles/Pumps: AROM;Both;10 reps;Supine Quad Sets: AROM;Strengthening;Both;10 reps;Supine Gluteal Sets: Strengthening;AROM;Both;10 reps;Supine Towel Squeeze: AROM;Left;10 reps;Supine Short Arc Quad: AROM;Left;10 reps;Supine Heel Slides: AAROM;Left;10 reps;Supine Hip ABduction/ADduction: AAROM;Strengthening;Left;10 reps;Supine Knee Flexion: AROM;Left;Standing;10 reps End of Session PT - End of Session Activity Tolerance: Patient tolerated treatment well Patient left: in bed;with call bell in reach (lunch delovered) General Behavior During Session: [facility] for tasks performed Cognition: [facility] for tasks performed  Van Clines [facility] [place], [place] [phone]  05/07/2011, 1:18 PM

## 2011-05-07 NOTE — Progress Notes (Signed)
PATIENT IS STABLE, H&H 9.2&24, AFEBRILE, INR-2.03,DOING WELL WITH PT.PAIN IS UNDER CONTROL, DISCHARGE NOTE DICTATED,REPORT [id number].WILL SEE IN THE OFFICE IN ONE WEEK.

## 2011-05-07 NOTE — Discharge Summary (Signed)
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  COMPLICATIONS:  None.  INFECTIONS:  None.  OPERATION:  Left total hip revision acetabular component and bone grafting.  The patient did receive 5 units of packed red blood cells during hospital day.  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  Has been ambulatory with use of cane, but no complaint of pain or discomfort. Two weeks prior to surgery, the patient was coming out of [employer] and felt a catch on her hip.  That particular weekend, pain progressively worsened.  The patient was seen in the emergency room, found to have loosening of the acetabular component with 2 screws backing out and 1 broken.  Pertinent physical was that of the left hip, the patient was still ambulatory with use of walker.  The left hip tender anterior and laterally with pain on hip extension and attempted rotation.  X-ray revealed shifting of the acetabular component with a broken acetabular screw and loosening 2 of the screws.  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  HOSPITAL COURSE:  The patient underwent preop laboratory CBC, EKG, chest x-ray, PT, PTT, platelet count, CMET, UA.  The patient's labs were stable enough to undergo surgery.  The patient underwent left total hip revision on May 04, 2011.  Postop course was fairly benign.  The patient did receive 5 units  of packed cells, 2 intraoperatively and 3 postop day.  The patient was started on pre and postop IV antibiotics, postop Coumadin prophylactically, physical therapy partial weightbearing 50% on the left side with use of walker.  The patient has done quite well with physical therapy.  The patient's last H and H on May 07, 2011, is 9.4 and 27.5, and INR 2.01 and PT of 23.1.  The patient's wound is healing quite well, no evidence of any bleeding.  The patient's pain is under control with use of Percocet 1-2 q.4 p.r.n. for pain.  The patient is on Feosol 325 mg t.i.d.  MEDICATIONS:  The patient is on: 1. Tylenol two q.6 p.r.n. 2. Lotensin 20 mg daily. 3. Black cohosh extract 1 capsule daily. 4. Calcium citrate with vitamin D 2 tablets daily. 5. Pletal 100 mg b.i.d. 6. Vitamin B12 three times weekly. 7. Colace 100 mg b.i.d. 8. Hydrochlorothiazide 12.5 mg daily. 9. Percocet 1-2 q.4 p.r.n. 10.Robaxin 500 mg b.i.d. 11.Omega 3 acid ethyl esters 1 g daily. 12.Zocor 10 mg daily. 13.Coumadin 5 mg daily per Pharmacy.  Daily dressing changes to the left hip, physical therapy, partial weightbearing 50%, left hip.  The patient to  return to the office in 1 week.  The patient being discharged in stable and satisfactory condition.     Myrtie Neither, MD     AC/MEDQ  D:  05/07/2011  T:  05/07/2011  Job:  [id number]

## 2011-05-07 NOTE — Progress Notes (Signed)
Patient for d/c today to [facility]. Patient and family agreeable to this plan. Plan transfer via EMS- Reece Levy, MSW, [place] [phone]

## 2011-05-07 NOTE — Progress Notes (Addendum)
ANTICOAGULATION CONSULT NOTE - Follow Up Consult  Pharmacy Consult for Coumadin Indication: VTE prophylaxis  No Known Allergies  Patient Measurements: Height: 5' (152.4 cm) Weight: 126 lb 14.4 oz (57.561 kg) IBW/kg (Calculated) : 45.5    Vital Signs: Temp: 97.9 F (36.6 C) (01/22 0540) BP: 101/61 mmHg (01/22 0540) Pulse Rate: 71  (01/22 0540)  Labs:  Alvira Philips 05/07/11 9604 05/06/11 2049 05/06/11 0653 05/05/11 0715  HGB -- 9.4* 7.2* --  HCT -- 27.5* 21.4* --  PLT -- -- -- --  APTT -- -- -- --  LABPROT 23.1* -- 26.0* 15.7*  INR 2.01* -- 2.34* 1.22  HEPARINUNFRC -- -- -- --  CREATININE -- -- -- --  CKTOTAL -- -- -- --  CKMB -- -- -- --  TROPONINI -- -- -- --   Estimated Creatinine Clearance: 51.2 ml/min (by C-G formula based on Cr of 0.68).   Assessment: 71yo female s/p L-Total Hip Revision.  INR with large jump yesterday after 2nd dose of Coumadin so coumadin held yesterday. INR trending down but still therapeutic.  No problems noted.  Hgb up to 9.4 last p.m.  Goal of Therapy:  INR 2-3   Plan:  Coumadin 1.5mg  today F/u daily INR  Amend, Hilario Quarry 05/07/2011,10:56 AM  Addendum: Patient is being discharged now to SNF. Coumadin to be given at SNF per MD's discharge orders.  Arman Filter 05/07/11 at 11:53

## 2011-05-08 ENCOUNTER — Encounter (HOSPITAL_COMMUNITY): Payer: Self-pay | Admitting: Orthopedic Surgery

## 2011-07-08 ENCOUNTER — Other Ambulatory Visit (HOSPITAL_COMMUNITY): Payer: Self-pay | Admitting: Pulmonary Disease

## 2011-07-08 DIAGNOSIS — Z1231 Encounter for screening mammogram for malignant neoplasm of breast: Secondary | ICD-10-CM

## 2011-08-08 ENCOUNTER — Ambulatory Visit (HOSPITAL_COMMUNITY): Payer: Medicare Other

## 2011-08-15 ENCOUNTER — Other Ambulatory Visit (HOSPITAL_COMMUNITY): Payer: Self-pay | Admitting: Pulmonary Disease

## 2011-08-15 DIAGNOSIS — Z780 Asymptomatic menopausal state: Secondary | ICD-10-CM

## 2011-08-22 ENCOUNTER — Ambulatory Visit (HOSPITAL_COMMUNITY)
Admission: RE | Admit: 2011-08-22 | Discharge: 2011-08-22 | Disposition: A | Discharge status: Home / Self Care | Payer: Medicare Other | Source: Ambulatory Visit | Attending: Pulmonary Disease | Admitting: Pulmonary Disease

## 2011-08-22 DIAGNOSIS — Z780 Asymptomatic menopausal state: Secondary | ICD-10-CM

## 2011-08-22 DIAGNOSIS — Z1231 Encounter for screening mammogram for malignant neoplasm of breast: Secondary | ICD-10-CM | POA: Insufficient documentation

## 2012-07-22 ENCOUNTER — Other Ambulatory Visit (HOSPITAL_COMMUNITY): Payer: Self-pay | Admitting: Pulmonary Disease

## 2012-07-22 DIAGNOSIS — Z1231 Encounter for screening mammogram for malignant neoplasm of breast: Secondary | ICD-10-CM

## 2012-08-24 ENCOUNTER — Ambulatory Visit (HOSPITAL_COMMUNITY)
Admission: RE | Admit: 2012-08-24 | Discharge: 2012-08-24 | Disposition: A | Discharge status: Home / Self Care | Payer: Medicare Other | Source: Ambulatory Visit | Attending: Pulmonary Disease | Admitting: Pulmonary Disease

## 2012-08-24 DIAGNOSIS — Z1231 Encounter for screening mammogram for malignant neoplasm of breast: Secondary | ICD-10-CM | POA: Insufficient documentation

## 2013-07-22 ENCOUNTER — Other Ambulatory Visit (HOSPITAL_COMMUNITY): Payer: Self-pay | Admitting: Pulmonary Disease

## 2013-07-22 DIAGNOSIS — Z1231 Encounter for screening mammogram for malignant neoplasm of breast: Secondary | ICD-10-CM

## 2013-08-23 HISTORY — PX: TRANSTHORACIC ECHOCARDIOGRAM: SHX275

## 2013-08-25 ENCOUNTER — Ambulatory Visit (HOSPITAL_COMMUNITY)
Admission: RE | Admit: 2013-08-25 | Discharge: 2013-08-25 | Disposition: A | Discharge status: Home / Self Care | Payer: Medicare Other | Source: Ambulatory Visit | Attending: Pulmonary Disease | Admitting: Pulmonary Disease

## 2013-08-25 DIAGNOSIS — Z1231 Encounter for screening mammogram for malignant neoplasm of breast: Secondary | ICD-10-CM | POA: Insufficient documentation

## 2013-10-23 ENCOUNTER — Emergency Department (HOSPITAL_COMMUNITY)
Admission: EM | Admit: 2013-10-23 | Discharge: 2013-10-23 | Disposition: A | Discharge status: Home / Self Care | Payer: Medicare Other | Attending: Emergency Medicine | Admitting: Emergency Medicine

## 2013-10-23 ENCOUNTER — Emergency Department (HOSPITAL_COMMUNITY): Payer: Medicare Other

## 2013-10-23 ENCOUNTER — Encounter (HOSPITAL_COMMUNITY): Payer: Self-pay | Admitting: Emergency Medicine

## 2013-10-23 DIAGNOSIS — I10 Essential (primary) hypertension: Secondary | ICD-10-CM | POA: Diagnosis not present

## 2013-10-23 DIAGNOSIS — S99929A Unspecified injury of unspecified foot, initial encounter: Secondary | ICD-10-CM | POA: Diagnosis present

## 2013-10-23 DIAGNOSIS — Z7982 Long term (current) use of aspirin: Secondary | ICD-10-CM | POA: Insufficient documentation

## 2013-10-23 DIAGNOSIS — M79604 Pain in right leg: Secondary | ICD-10-CM

## 2013-10-23 DIAGNOSIS — W108XXA Fall (on) (from) other stairs and steps, initial encounter: Secondary | ICD-10-CM | POA: Diagnosis not present

## 2013-10-23 DIAGNOSIS — S8990XA Unspecified injury of unspecified lower leg, initial encounter: Secondary | ICD-10-CM | POA: Insufficient documentation

## 2013-10-23 DIAGNOSIS — Z79899 Other long term (current) drug therapy: Secondary | ICD-10-CM | POA: Diagnosis not present

## 2013-10-23 DIAGNOSIS — S79919A Unspecified injury of unspecified hip, initial encounter: Secondary | ICD-10-CM | POA: Insufficient documentation

## 2013-10-23 DIAGNOSIS — S79929A Unspecified injury of unspecified thigh, initial encounter: Secondary | ICD-10-CM

## 2013-10-23 DIAGNOSIS — E7800 Pure hypercholesterolemia, unspecified: Secondary | ICD-10-CM | POA: Diagnosis not present

## 2013-10-23 DIAGNOSIS — M129 Arthropathy, unspecified: Secondary | ICD-10-CM | POA: Insufficient documentation

## 2013-10-23 DIAGNOSIS — Y9389 Activity, other specified: Secondary | ICD-10-CM | POA: Insufficient documentation

## 2013-10-23 DIAGNOSIS — Y929 Unspecified place or not applicable: Secondary | ICD-10-CM | POA: Insufficient documentation

## 2013-10-23 DIAGNOSIS — S99919A Unspecified injury of unspecified ankle, initial encounter: Principal | ICD-10-CM

## 2013-10-23 MED ORDER — NAPROXEN 500 MG PO TABS: 500.0000 mg | ORAL_TABLET | Freq: Two times a day (BID) | ORAL | Status: DC

## 2013-10-23 MED ORDER — NAPROXEN 500 MG PO TABS
500.0000 mg | ORAL_TABLET | Freq: Once | ORAL | Status: AC
  Administered 2013-10-23: 500 mg via ORAL
  Filled 2013-10-23: qty 1

## 2013-10-23 NOTE — Discharge Instructions (Signed)
Your xrays are normal, use ice packs and naprosyn as prescribed.

## 2013-10-23 NOTE — ED Notes (Signed)
Pt states she fell on brick steps last week. Pt states pain to R thigh not improving.

## 2013-10-23 NOTE — ED Provider Notes (Signed)
CSN: [id number]     Arrival date & time 10/23/13  0257 History   First MD Initiated Contact with Patient 10/23/13 0349     Chief Complaint  Patient presents with  . Leg Injury     (Consider location/radiation/quality/duration/timing/severity/associated sxs/prior Treatment) HPI Comments: Mrs. Mccasland is a 73 year old female with a history of arthritis and hypertension who had a fall several days ago where she fell backwards onto her buttock on a cement stair. Since that time she has had some back pain and some right thigh pain laterally. This gets worse when she walks, it is constant, it is not associated with bruising or swelling, there was no associated spine or neck or head injury.  The history is provided by the patient and a relative.    Past Medical History  Diagnosis Date  . Arthritis   . Hypercholesteremia   . Hypertension     FOLLOWED BY DR Katherine Roan   Past Surgical History  Procedure Laterality Date  . Joint replacement    . 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 Abdominal hysterectomy    . Total hip revision  05/02/2011    Procedure: TOTAL HIP REVISION;  Surgeon: Sharmon Revere, MD;  Location: [facility];  Service: Orthopedics;  Laterality: Left;  left total hip revision with bone grafting   No family history on file. History  Substance Use Topics  . Smoking status: Never Smoker   . Smokeless tobacco: Not on file  . Alcohol Use: No   OB History   Grav Para Term Preterm Abortions TAB SAB Ect Mult Living                 Review of Systems  Musculoskeletal: Positive for myalgias. Negative for back pain.  Skin: Negative for wound.      Allergies  Review of patient's allergies indicates no known allergies.  Home Medications   Prior to Admission medications   Medication Sig Start Date End Date Taking? Authorizing Provider  aspirin EC 81 MG tablet Take 81 mg by mouth daily.   Yes Historical Provider, MD  benazepril-hydrochlorthiazide (LOTENSIN HCT) 20-12.5 MG  per tablet Take 1 tablet by mouth daily.     Yes Historical Provider, MD  Calcium Citrate-Vitamin D (CITRACAL MAXIMUM PO) Take 2 tablets by mouth daily.    Yes Historical Provider, MD  cilostazol (PLETAL) 100 MG tablet Take 100 mg by mouth 2 (two) times daily.    Yes Historical Provider, MD  simvastatin (ZOCOR) 10 MG tablet Take 10 mg by mouth at bedtime.     Yes Historical Provider, MD  naproxen (NAPROSYN) 500 MG tablet Take 1 tablet (500 mg total) by mouth 2 (two) times daily with a meal. 10/23/13   Johnna Acosta, MD   BP 130/73  Pulse 61  Temp(Src) 98.8 F (37.1 C) (Oral)  Resp 16  Ht 5' (1.524 m)  Wt 123 lb (55.792 kg)  BMI 24.02 kg/m2  SpO2 98% Physical Exam  Nursing note and vitals reviewed. Constitutional: She appears well-developed and well-nourished. No distress.  HENT:  Head: Normocephalic and atraumatic.  Eyes: Conjunctivae are normal. No scleral icterus.  Cardiovascular: Normal rate, regular rhythm and intact distal pulses.   Pulmonary/Chest: Effort normal and breath sounds normal.  Musculoskeletal: She exhibits tenderness ( Tender to palpation over the right anterior and lateral thigh, no associated bruising or swelling is appreciable). She exhibits no edema.  Neurological: She is alert.  Slight antalgic gait secondary  to thigh pain on the right, follows all commands without difficulty, straight leg raise bilaterally with minimal difficulty  Skin: Skin is warm and dry. No rash noted. She is not diaphoretic.    ED Course  Procedures (including critical care time) Labs Review Labs Reviewed - No data to display  Imaging Review Dg Hip Complete Right  10/23/2013   CLINICAL DATA:  Zero days ago.  Right hip and leg pain.  EXAM: RIGHT HIP - COMPLETE 2+ VIEW  COMPARISON:  None.  FINDINGS: Left hip arthroplasty. Pelvis appears intact. Degenerative changes in the SI joints and symphysis pubis. Degenerative changes in the right hip with joint space narrowing and subcortical cyst  on the acetabulum. Lateral calcification off of the acetabulum may represent ligamentous calcification or hypertrophic changes. This could cause impingement. No evidence of acute fracture or dislocation.  IMPRESSION: Degenerative changes in the right hip.  No acute bony abnormalities.   Electronically Signed   By: Lucienne Capers M.D.   On: 10/23/2013 04:09   Dg Femur Right  10/23/2013   CLINICAL DATA:  Right hip and mid femur pain after a fall several days ago.  EXAM: RIGHT FEMUR - 2 VIEW  COMPARISON:  None.  FINDINGS: Degenerative changes in the right hip. See additional description on right hip report. Degenerative changes in the right knee with medial greater than lateral compartment narrowing. Chondrocalcinosis. No evidence of acute fracture or dislocation of the femur.  IMPRESSION: Degenerative changes in the right hip and right knee. No acute bony abnormalities.   Electronically Signed   By: Lucienne Capers M.D.   On: 10/23/2013 04:10      MDM   Final diagnoses:  Pain of right leg    The joints of the right lower extremity are supple and without deformity, the extremities are normal with soft compartments except for mild tenderness of the right thigh. The compartment is soft, the x-rays are negative for fracture or dislocation, the patient has been given pain medications and is stable for discharge   Meds given in ED:  Medications  naproxen (NAPROSYN) tablet 500 mg (not administered)    New Prescriptions   NAPROXEN (NAPROSYN) 500 MG TABLET    Take 1 tablet (500 mg total) by mouth 2 (two) times daily with a meal.        Johnna Acosta, MD 10/23/13 0430

## 2014-06-29 ENCOUNTER — Other Ambulatory Visit (HOSPITAL_COMMUNITY): Payer: Self-pay | Admitting: Pulmonary Disease

## 2014-06-29 DIAGNOSIS — Z1231 Encounter for screening mammogram for malignant neoplasm of breast: Secondary | ICD-10-CM

## 2014-08-29 ENCOUNTER — Ambulatory Visit (HOSPITAL_COMMUNITY)
Admission: RE | Admit: 2014-08-29 | Discharge: 2014-08-29 | Disposition: A | Discharge status: Home / Self Care | Payer: Medicare Other | Source: Ambulatory Visit | Attending: Pulmonary Disease | Admitting: Pulmonary Disease

## 2014-08-29 DIAGNOSIS — Z1231 Encounter for screening mammogram for malignant neoplasm of breast: Secondary | ICD-10-CM

## 2014-12-09 ENCOUNTER — Emergency Department (HOSPITAL_COMMUNITY)
Admission: EM | Admit: 2014-12-09 | Discharge: 2014-12-09 | Disposition: A | Discharge status: Home / Self Care | Payer: Medicare Other | Attending: Emergency Medicine | Admitting: Emergency Medicine

## 2014-12-09 ENCOUNTER — Encounter (HOSPITAL_COMMUNITY): Payer: Self-pay | Admitting: Emergency Medicine

## 2014-12-09 ENCOUNTER — Emergency Department (HOSPITAL_COMMUNITY): Payer: Medicare Other

## 2014-12-09 DIAGNOSIS — M25552 Pain in left hip: Secondary | ICD-10-CM

## 2014-12-09 DIAGNOSIS — E780 Pure hypercholesterolemia: Secondary | ICD-10-CM | POA: Insufficient documentation

## 2014-12-09 DIAGNOSIS — Z79899 Other long term (current) drug therapy: Secondary | ICD-10-CM | POA: Diagnosis not present

## 2014-12-09 DIAGNOSIS — I10 Essential (primary) hypertension: Secondary | ICD-10-CM | POA: Diagnosis not present

## 2014-12-09 DIAGNOSIS — M1990 Unspecified osteoarthritis, unspecified site: Secondary | ICD-10-CM | POA: Diagnosis not present

## 2014-12-09 DIAGNOSIS — Z7982 Long term (current) use of aspirin: Secondary | ICD-10-CM | POA: Insufficient documentation

## 2014-12-09 MED ORDER — HYDROCODONE-ACETAMINOPHEN 5-325 MG PO TABS: 1.0000 | ORAL_TABLET | Freq: Four times a day (QID) | ORAL | Status: DC | PRN

## 2014-12-09 MED ORDER — HYDROCODONE-ACETAMINOPHEN 5-325 MG PO TABS
1.0000 | ORAL_TABLET | Freq: Once | ORAL | Status: AC
  Administered 2014-12-09: 1 via ORAL
  Filled 2014-12-09: qty 1

## 2014-12-09 NOTE — Discharge Instructions (Signed)
We saw you in the ER for the pain. All the results in the ER are normal. We are not sure what is causing your symptoms. The workup in the ER is not complete, and is limited to screening for life threatening and emergent conditions only, so please see a primary care doctor for further evaluation.   Arthritis, Nonspecific Arthritis is inflammation of a joint. This usually means pain, redness, warmth or swelling are present. One or more joints may be involved. There are a number of types of arthritis. Your caregiver may not be able to tell what type of arthritis you have right away. CAUSES  The most common cause of arthritis is the wear and tear on the joint (osteoarthritis). This causes damage to the cartilage, which can break down over time. The knees, hips, back and neck are most often affected by this type of arthritis. Other types of arthritis and common causes of joint pain include:  Sprains and other injuries near the joint. Sometimes minor sprains and injuries cause pain and swelling that develop hours later.  Rheumatoid arthritis. This affects hands, feet and knees. It usually affects both sides of your body at the same time. It is often associated with chronic ailments, fever, weight loss and general weakness.  Crystal arthritis. Gout and pseudo gout can cause occasional acute severe pain, redness and swelling in the foot, ankle, or knee.  Infectious arthritis. Bacteria can get into a joint through a break in overlying skin. This can cause infection of the joint. Bacteria and viruses can also spread through the blood and affect your joints.  Drug, infectious and allergy reactions. Sometimes joints can become mildly painful and slightly swollen with these types of illnesses. SYMPTOMS   Pain is the main symptom.  Your joint or joints can also be red, swollen and warm or hot to the touch.  You may have a fever with certain types of arthritis, or even feel overall ill.  The joint with  arthritis will hurt with movement. Stiffness is present with some types of arthritis. DIAGNOSIS  Your caregiver will suspect arthritis based on your description of your symptoms and on your exam. Testing may be needed to find the type of arthritis:  Blood and sometimes urine tests.  X-ray tests and sometimes CT or MRI scans.  Removal of fluid from the joint (arthrocentesis) is done to check for bacteria, crystals or other causes. Your caregiver (or a specialist) will numb the area over the joint with a local anesthetic, and use a needle to remove joint fluid for examination. This procedure is only minimally uncomfortable.  Even with these tests, your caregiver may not be able to tell what kind of arthritis you have. Consultation with a specialist (rheumatologist) may be helpful. TREATMENT  Your caregiver will discuss with you treatment specific to your type of arthritis. If the specific type cannot be determined, then the following general recommendations may apply. Treatment of severe joint pain includes:  Rest.  Elevation.  Anti-inflammatory medication (for example, ibuprofen) may be prescribed. Avoiding activities that cause increased pain.  Only take over-the-counter or prescription medicines for pain and discomfort as recommended by your caregiver.  Cold packs over an inflamed joint may be used for 10 to 15 minutes every hour. Hot packs sometimes feel better, but do not use overnight. Do not use hot packs if you are diabetic without your caregiver's permission.  A cortisone shot into arthritic joints may help reduce pain and swelling.  Any acute arthritis  that gets worse over the next 1 to 2 days needs to be looked at to be sure there is no joint infection. Long-term arthritis treatment involves modifying activities and lifestyle to reduce joint stress jarring. This can include weight loss. Also, exercise is needed to nourish the joint cartilage and remove waste. This helps keep the  muscles around the joint strong. HOME CARE INSTRUCTIONS   Do not take aspirin to relieve pain if gout is suspected. This elevates uric acid levels.  Only take over-the-counter or prescription medicines for pain, discomfort or fever as directed by your caregiver.  Rest the joint as much as possible.  If your joint is swollen, keep it elevated.  Use crutches if the painful joint is in your leg.  Drinking plenty of fluids may help for certain types of arthritis.  Follow your caregiver's dietary instructions.  Try low-impact exercise such as:  Swimming.  Water aerobics.  Biking.  Walking.  Morning stiffness is often relieved by a warm shower.  Put your joints through regular range-of-motion. SEEK MEDICAL CARE IF:   You do not feel better in 24 hours or are getting worse.  You have side effects to medications, or are not getting better with treatment. SEEK IMMEDIATE MEDICAL CARE IF:   You have a fever.  You develop severe joint pain, swelling or redness.  Many joints are involved and become painful and swollen.  There is severe back pain and/or leg weakness.  You have loss of bowel or bladder control. Document Released: 05/09/2004 Document Revised: 06/24/2011 Document Reviewed: 05/25/2008 [facility] Patient Information 2015 [place], [place]. This information is not intended to replace advice given to you by your health care provider. Make sure you discuss any questions you have with your health care provider.

## 2014-12-09 NOTE — ED Provider Notes (Signed)
CSN: [id number]     Arrival date & time 12/09/14  1259 History   First MD Initiated Contact with Patient 12/09/14 1323     Chief Complaint  Patient presents with  . Hip Pain     (Consider location/radiation/quality/duration/timing/severity/associated sxs/prior Treatment) HPI Comments: L sided hip pain, no new falls, trauma. Pain is making walking a bit more difficult, pt is using cain more often. Hx of hardware complication on that side.  Patient is a 74 y.o. female presenting with hip pain. The history is provided by the patient.  Hip Pain This is a new problem. The current episode started 2 days ago. The problem occurs constantly. Pertinent negatives include no chest pain, no abdominal pain, no headaches and no shortness of breath. The symptoms are aggravated by walking.    Past Medical History  Diagnosis Date  . Arthritis   . Hypercholesteremia   . Hypertension     FOLLOWED BY DR Katherine Roan   Past Surgical History  Procedure Laterality Date  . Joint replacement    . 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 Abdominal hysterectomy    . Total hip revision  05/02/2011    Procedure: TOTAL HIP REVISION;  Surgeon: Sharmon Revere, MD;  Location: [facility];  Service: Orthopedics;  Laterality: Left;  left total hip revision with bone grafting   No family history on file. Social History  Substance Use Topics  . Smoking status: Never Smoker   . Smokeless tobacco: None  . Alcohol Use: No   OB History    No data available     Review of Systems  Constitutional: Negative for activity change.  Respiratory: Negative for shortness of breath.   Cardiovascular: Negative for chest pain.  Gastrointestinal: Negative for nausea, vomiting and abdominal pain.  Genitourinary: Negative for dysuria.  Musculoskeletal: Positive for arthralgias. Negative for neck pain.  Skin: Negative for wound.  Neurological: Negative for headaches.      Allergies  Review of patient's allergies indicates no  known allergies.  Home Medications   Prior to Admission medications   Medication Sig Start Date End Date Taking? Authorizing Provider  aspirin EC 81 MG tablet Take 81 mg by mouth daily.   Yes Historical Provider, MD  benazepril-hydrochlorthiazide (LOTENSIN HCT) 20-12.5 MG per tablet Take 1 tablet by mouth 3 (three) times a week. Tuesday, Thursday and saturday   Yes Historical Provider, MD  Calcium Citrate-Vitamin D (CITRACAL MAXIMUM PO) Take 2 tablets by mouth daily.    Yes Historical Provider, MD  cilostazol (PLETAL) 100 MG tablet Take 100 mg by mouth 2 (two) times daily as needed (circulation).    Yes Historical Provider, MD  etodolac (LODINE) 400 MG tablet Take 400 mg by mouth 2 (two) times daily.   Yes Historical Provider, MD  Omega-3 Fatty Acids (FISH OIL PO) Take 1 capsule by mouth 2 (two) times a week.   Yes Historical Provider, MD  simvastatin (ZOCOR) 10 MG tablet Take 10 mg by mouth at bedtime.     Yes Historical Provider, MD  HYDROcodone-acetaminophen (NORCO/VICODIN) 5-325 MG per tablet Take 1 tablet by mouth every 6 (six) hours as needed for severe pain. 12/09/14   Neven Fina, MD   BP 124/61 mmHg  Pulse 60  Temp(Src) 98.7 F (37.1 C) (Oral)  Resp 16  SpO2 99% Physical Exam  Constitutional: She is oriented to person, place, and time. She appears well-developed.  HENT:  Head: Normocephalic and atraumatic.  Eyes: Conjunctivae  and EOM are normal. Pupils are equal, round, and reactive to light.  Neck: Normal range of motion. Neck supple.  Cardiovascular: Normal rate, regular rhythm and normal heart sounds.   Pulmonary/Chest: Effort normal and breath sounds normal. No respiratory distress.  Abdominal: Soft. Bowel sounds are normal. There is no guarding.  Musculoskeletal:  Mild L sided hip tenderness, with no deformity. HIP ROM is normal. Strength 4+/5  Neurological: She is alert and oriented to person, place, and time.  Skin: Skin is warm and dry.  Nursing note and vitals  reviewed.   ED Course  Procedures (including critical care time) Labs Review Labs Reviewed - No data to display  Imaging Review Dg Hip Unilat With Pelvis 2-3 Views Left  12/09/2014   CLINICAL DATA:  Left hip pain starting Wednesday  EXAM: DG HIP (WITH OR WITHOUT PELVIS) 2-3V LEFT  COMPARISON:  None.  FINDINGS: Three views of the left hip submitted. No acute fracture or subluxation. There is left hip prosthesis anatomic alignment. Prostatic material appears intact. Degenerative changes are noted pubic symphysis. Mild degenerative changes bilateral SI joints. Diffuse osteopenia.  IMPRESSION: No acute fracture or subluxation. Left hip prosthesis with anatomic alignment. Degenerative changes as described above. There is diffuse osteopenia.   Electronically Signed   By: Lahoma Crocker M.D.   On: 12/09/2014 15:09   I have personally reviewed and evaluated these images and lab results as part of my medical decision-making.   EKG Interpretation None      MDM   Final diagnoses:  Hip pain, acute, left    Pt comes in with cc of hip pain. Patient is ambulating for me and her. No clinical concerns for septic arthritis. Will d/c. Advise RICE - and close f/u with pcp for further eval.  Varney Biles, MD 12/09/14 1553

## 2014-12-09 NOTE — ED Notes (Signed)
Pt c/o left hip pain and swelling.  Per family pt has had surgery on left hip before.  Pt denies any fall or injury to cause the recent pain.

## 2014-12-09 NOTE — ED Notes (Signed)
MD at bedside. 

## 2015-08-07 ENCOUNTER — Other Ambulatory Visit: Payer: Self-pay

## 2015-08-07 DIAGNOSIS — Z1231 Encounter for screening mammogram for malignant neoplasm of breast: Secondary | ICD-10-CM

## 2015-09-04 ENCOUNTER — Ambulatory Visit
Admission: RE | Admit: 2015-09-04 | Discharge: 2015-09-04 | Disposition: A | Discharge status: Home / Self Care | Payer: Medicare Other | Source: Ambulatory Visit

## 2015-09-04 ENCOUNTER — Ambulatory Visit: Payer: Medicare Other

## 2015-09-04 DIAGNOSIS — Z1231 Encounter for screening mammogram for malignant neoplasm of breast: Secondary | ICD-10-CM

## 2015-10-01 ENCOUNTER — Encounter (HOSPITAL_COMMUNITY): Payer: Self-pay | Admitting: Emergency Medicine

## 2015-10-01 ENCOUNTER — Emergency Department (HOSPITAL_COMMUNITY)
Admission: EM | Admit: 2015-10-01 | Discharge: 2015-10-01 | Disposition: A | Discharge status: Home / Self Care | Payer: Medicare Other | Attending: Emergency Medicine | Admitting: Emergency Medicine

## 2015-10-01 ENCOUNTER — Emergency Department (HOSPITAL_COMMUNITY): Payer: Medicare Other

## 2015-10-01 DIAGNOSIS — M1990 Unspecified osteoarthritis, unspecified site: Secondary | ICD-10-CM | POA: Diagnosis not present

## 2015-10-01 DIAGNOSIS — R0602 Shortness of breath: Secondary | ICD-10-CM

## 2015-10-01 DIAGNOSIS — I10 Essential (primary) hypertension: Secondary | ICD-10-CM | POA: Insufficient documentation

## 2015-10-01 DIAGNOSIS — J069 Acute upper respiratory infection, unspecified: Secondary | ICD-10-CM | POA: Insufficient documentation

## 2015-10-01 DIAGNOSIS — R05 Cough: Secondary | ICD-10-CM | POA: Diagnosis present

## 2015-10-01 MED ORDER — BENZONATATE 100 MG PO CAPS: 100.0000 mg | ORAL_CAPSULE | Freq: Three times a day (TID) | ORAL | Status: DC

## 2015-10-01 NOTE — ED Provider Notes (Signed)
CSN: [id number]     Arrival date & time 10/01/15  1307 History   First MD Initiated Contact with Patient 10/01/15 1414     Chief Complaint  Patient presents with  . Cough     (Consider location/radiation/quality/duration/timing/severity/associated sxs/prior Treatment) HPI Christina Peterson is a 75 y.o. female history of hypertension, hypercholesterolemia and arthritis here for evaluation of cough. Patient reports since Thursday, she has developed a nonproductive dry cough with associated chest congestion, sore throat, runny nose. She has tried multiple OTC medications without relief. She reports a fever on Thursday and then spontaneously resolved. She denies any overt chest pain, shortness of breath, hemoptysis, leg swelling, abdominal pain, numbness or weakness, orthopnea or PND. States she will be able to follow up with her PCP next week for reevaluation. No other modifying factors.  Past Medical History  Diagnosis Date  . Arthritis   . Hypercholesteremia   . Hypertension     FOLLOWED BY DR Katherine Roan   Past Surgical History  Procedure Laterality Date  . Joint replacement    . 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 Abdominal hysterectomy    . Total hip revision  05/02/2011    Procedure: TOTAL HIP REVISION;  Surgeon: Sharmon Revere, MD;  Location: [facility];  Service: Orthopedics;  Laterality: Left;  left total hip revision with bone grafting   History reviewed. No pertinent family history. Social History  Substance Use Topics  . Smoking status: Never Smoker   . Smokeless tobacco: None  . Alcohol Use: No   OB History    No data available     Review of Systems A 10 point review of systems was completed and was negative except for pertinent positives and negatives as mentioned in the history of present illness     Allergies  Review of patient's allergies indicates no known allergies.  Home Medications   Prior to Admission medications   Medication Sig Start Date End Date  Taking? Authorizing Provider  aspirin EC 81 MG tablet Take 81 mg by mouth daily.    Historical Provider, MD  benazepril-hydrochlorthiazide (LOTENSIN HCT) 20-12.5 MG per tablet Take 1 tablet by mouth 3 (three) times a week. Tuesday, Thursday and saturday    Historical Provider, MD  Calcium Citrate-Vitamin D (CITRACAL MAXIMUM PO) Take 2 tablets by mouth daily.     Historical Provider, MD  cilostazol (PLETAL) 100 MG tablet Take 100 mg by mouth 2 (two) times daily as needed (circulation).     Historical Provider, MD  etodolac (LODINE) 400 MG tablet Take 400 mg by mouth 2 (two) times daily.    Historical Provider, MD  HYDROcodone-acetaminophen (NORCO/VICODIN) 5-325 MG per tablet Take 1 tablet by mouth every 6 (six) hours as needed for severe pain. 12/09/14   Varney Biles, MD  Omega-3 Fatty Acids (FISH OIL PO) Take 1 capsule by mouth 2 (two) times a week.    Historical Provider, MD  simvastatin (ZOCOR) 10 MG tablet Take 10 mg by mouth at bedtime.      Historical Provider, MD   BP 144/85 mmHg  Pulse 76  Temp(Src) 98.8 F (37.1 C) (Oral)  Resp 20  SpO2 100% Physical Exam  Constitutional: She is oriented to person, place, and time. She appears well-developed and well-nourished.  Very well-appearing, nontoxic  HENT:  Head: Normocephalic and atraumatic.  Mouth/Throat: Oropharynx is clear and moist.  Eyes: Conjunctivae are normal. Pupils are equal, round, and reactive to light. Right eye  exhibits no discharge. Left eye exhibits no discharge. No scleral icterus.  Neck: Neck supple.  Cardiovascular: Normal rate, regular rhythm and normal heart sounds.   Pulmonary/Chest: Effort normal and breath sounds normal. No respiratory distress. She has no wheezes. She has no rales.  Abdominal: Soft. There is no tenderness.  Musculoskeletal: Normal range of motion. She exhibits no edema or tenderness.  Neurological: She is alert and oriented to person, place, and time.  Cranial Nerves II-XII grossly intact   Skin: Skin is warm and dry. No rash noted.  Psychiatric: She has a normal mood and affect.  Nursing note and vitals reviewed.   ED Course  Procedures (including critical care time) Labs Review Labs Reviewed - No data to display  Imaging Review Dg Chest 2 View  10/01/2015  CLINICAL DATA:  Nonproductive cough, shortness of breath. EXAM: CHEST  2 VIEW COMPARISON:  Radiograph of April 30, 2011. FINDINGS: Stable cardiomegaly. No pneumothorax or pleural effusion is noted. Both lungs are clear. The visualized skeletal structures are unremarkable. IMPRESSION: No active cardiopulmonary disease. Electronically Signed   By: Marijo Conception, M.D.   On: 10/01/2015 14:31   I have personally reviewed and evaluated these images and lab results as part of my medical decision-making.   EKG Interpretation None      MDM  Here for cough, sore throat and runny nose since Thursday. Exam is reassuring. She is afebrile and hemodynamically stable and overall appears very well. Chest x-ray is unremarkable. Clinical presentation consistent with URI, not consistent with other emergent pathology. Doubt PE. Will DC with Tessalon for cough, encouraged continued use of OTC medications and follow-up with PCP next week. Discussed with Dr. Alvino Chapel. Final diagnoses:  SOB (shortness of breath)       Comer Locket, PA-C 10/01/15 1632  Davonna Belling, MD 10/01/15 [phone]

## 2015-10-01 NOTE — Discharge Instructions (Signed)
There does not appear to be an emergent cause for your symptoms at this time. Your exam was reassuring. Your x-ray showed no evidence of infection. Take your medications as prescribed to help with her cough. Follow up with your doctor next week for reevaluation. Return to ED for new or worsening symptoms.  Upper Respiratory Infection, Adult Most upper respiratory infections (URIs) are caused by a virus. A URI affects the nose, throat, and upper air passages. The most common type of URI is often called "the common cold." HOME CARE   Take medicines only as told by your doctor.  Gargle warm saltwater or take cough drops to comfort your throat as told by your doctor.  Use a warm mist humidifier or inhale steam from a shower to increase air moisture. This may make it easier to breathe.  Drink enough fluid to keep your pee (urine) clear or pale yellow.  Eat soups and other clear broths.  Have a healthy diet.  Rest as needed.  Go back to work when your fever is gone or your doctor says it is okay.  You may need to stay home longer to avoid giving your URI to others.  You can also wear a face mask and wash your hands often to prevent spread of the virus.  Use your inhaler more if you have asthma.  Do not use any tobacco products, including cigarettes, chewing tobacco, or electronic cigarettes. If you need help quitting, ask your doctor. GET HELP IF:  You are getting worse, not better.  Your symptoms are not helped by medicine.  You have chills.  You are getting more short of breath.  You have brown or red mucus.  You have yellow or brown discharge from your nose.  You have pain in your face, especially when you bend forward.  You have a fever.  You have puffy (swollen) neck glands.  You have pain while swallowing.  You have white areas in the back of your throat. GET HELP RIGHT AWAY IF:   You have very bad or constant:  Headache.  Ear pain.  Pain in your forehead,  behind your eyes, and over your cheekbones (sinus pain).  Chest pain.  You have long-lasting (chronic) lung disease and any of the following:  Wheezing.  Long-lasting cough.  Coughing up blood.  A change in your usual mucus.  You have a stiff neck.  You have changes in your:  Vision.  Hearing.  Thinking.  Mood. MAKE SURE YOU:   Understand these instructions.  Will watch your condition.  Will get help right away if you are not doing well or get worse.   This information is not intended to replace advice given to you by your health care provider. Make sure you discuss any questions you have with your health care provider.   Document Released: 09/18/2007 Document Revised: 08/16/2014 Document Reviewed: 07/07/2013 Elsevier Interactive Patient Education [employer].

## 2015-10-01 NOTE — ED Notes (Signed)
Pt c/o productive cough and sore throat x3 days. Pt reports chest pain when coughing.

## 2016-02-07 ENCOUNTER — Ambulatory Visit (INDEPENDENT_AMBULATORY_CARE_PROVIDER_SITE_OTHER): Payer: Self-pay | Admitting: Orthopaedic Surgery

## 2016-06-05 ENCOUNTER — Encounter (INDEPENDENT_AMBULATORY_CARE_PROVIDER_SITE_OTHER): Payer: Self-pay | Admitting: Orthopaedic Surgery

## 2016-06-05 ENCOUNTER — Ambulatory Visit (INDEPENDENT_AMBULATORY_CARE_PROVIDER_SITE_OTHER): Payer: Medicare Other

## 2016-06-05 ENCOUNTER — Ambulatory Visit (INDEPENDENT_AMBULATORY_CARE_PROVIDER_SITE_OTHER): Payer: Medicare Other | Admitting: Orthopaedic Surgery

## 2016-06-05 VITALS — BP 129/76 | HR 60 | Ht 60.0 in | Wt 115.0 lb

## 2016-06-05 DIAGNOSIS — M25552 Pain in left hip: Secondary | ICD-10-CM | POA: Diagnosis not present

## 2016-06-05 NOTE — Progress Notes (Signed)
Office Visit Note   Patient: Christina Peterson           Date of Birth: 1941-03-04           MRN: [id number] Visit Date: 06/05/2016              Requested by: Harvie Bridge, MD [id number], [place], [place] [address] PCP: Julius Bowels, MD (Inactive)   Assessment & Plan: Visit Diagnoses:  1. Pain in left hip     Plan: Patient has some gap behind the acetabular component but it is well fixed with screws of the screw breakage or loosening. There is some sclerosis around the stem but no obvious loosening. She develops increase in pain she'll let us know. Repeat standing AP x-ray on return in 6 months and frog-leg left hip on return.  Follow-Up Instructions: Return in about 6 months (around 12/03/2016).   Orders:  Orders Placed This Encounter  Procedures  . XR HIP UNILAT W OR W/O PELVIS 2-3 VIEWS LEFT   No orders of the defined types were placed in this encounter.     Procedures: No procedures performed   Clinical Data: No additional findings.   Subjective: Chief Complaint  Patient presents with  . Left Hip - Pain    Patient presents with left hip pain. She states that Dr. Marily Memos did total hip surgery in 2011 and then she had it redone in 2013 due to loosening. She has had increased pain x 2 weeks. The pain runs down into her thigh.  Patient one episode of dislocation and then underwent a revision for a broken acetabular screws with evidence of loosening. She has an acetabular cup with multiple screws currently. Hip hurts when the weather changes chest and with the cane she also has a walker. Since her 2013 surgery she's not had any dislocations.  Review of Systems  Constitutional: Negative for chills and diaphoresis.  HENT: Negative for ear discharge, ear pain and nosebleeds.   Eyes: Negative for discharge and visual disturbance.  Respiratory: Negative for cough, choking and shortness of breath.   Cardiovascular: Negative for chest pain  and palpitations.  Gastrointestinal: Negative for abdominal distention and abdominal pain.  Endocrine: Negative for cold intolerance and heat intolerance.  Genitourinary: Negative for flank pain and hematuria.  Musculoskeletal:       Left total hip arthroplasty 2011 with revision for loose acetabulum. Patient has the shallow right acetabulum with the partial uncoverage of the head  Skin: Negative for rash and wound.  Neurological: Negative for seizures and speech difficulty.  Hematological: Negative for adenopathy. Does not bruise/bleed easily.  Psychiatric/Behavioral: Negative for agitation and suicidal ideas.     Objective: Vital Signs: BP 129/76   Pulse 60   Ht 5' (1.524 m)   Wt 115 lb (52.2 kg)   BMI 22.46 kg/m   Physical Exam  Constitutional: She is oriented to person, place, and time. She appears well-developed.  HENT:  Head: Normocephalic.  Right Ear: External ear normal.  Left Ear: External ear normal.  Eyes: Pupils are equal, round, and reactive to light.  Neck: No tracheal deviation present. No thyromegaly present.  Cardiovascular: Normal rate.   Pulmonary/Chest: Effort normal.  Abdominal: Soft.  Musculoskeletal:  Mild Trendelenburg gait on the left. Mild trochanteric bursal tenderness no pain with hip internal/external rotation pelvis is level leg lengths are equal distal pulses are intact no pitting edema quad strength is good. Anterior tib EHL is active no sciatic  notch tenderness and sciatic motor and sensory is normal right and left. She has some arthritic changes at the base of the thumb [facility] joint bilaterally.  Neurological: She is alert and oriented to person, place, and time.  Skin: Skin is warm and dry.  Psychiatric: She has a normal mood and affect. Her behavior is normal.    Ortho Exam  Specialty Comments:  No specialty comments available.  Imaging: Xr Hip Unilat W Or W/o Pelvis 2-3 Views Left  Result Date: 06/05/2016 AP pelvis and frog-leg left hip  obtained which shows Biomet total hip arthroplasty. Patient has shallow acetabular socket and socket is fixed with multiple screws. Slight sclerotic zone around the femoral stem without windshield wiper lucency Impression: Post total hip arthroplasty. No loosening of the acetabular screws. Slight sclerotic line around the femoral stem without obvious loosening.    PMFS History: Patient Active Problem List   Diagnosis Date Noted  . Pain in left hip 06/05/2016   Past Medical History:  Diagnosis Date  . Arthritis   . Hypercholesteremia   . Hypertension    FOLLOWED BY DR Katherine Roan    No family history on file.  Past Surgical History:  Procedure Laterality Date  . ABDOMINAL HYSTERECTOMY    . JOINT REPLACEMENT    . 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 TOTAL HIP REVISION  05/02/2011   Procedure: TOTAL HIP REVISION;  Surgeon: Sharmon Revere, MD;  Location: [facility];  Service: Orthopedics;  Laterality: Left;  left total hip revision with bone grafting   Social History   Occupational History  . Not on file.   Social History Main Topics  . Smoking status: Never Smoker  . Smokeless tobacco: Never Used  . Alcohol use No  . Drug use: No  . Sexual activity: Not on file

## 2016-07-19 ENCOUNTER — Emergency Department (HOSPITAL_COMMUNITY)
Admission: EM | Admit: 2016-07-19 | Discharge: 2016-07-20 | Disposition: A | Discharge status: Home / Self Care | Payer: Medicare Other | Attending: Emergency Medicine | Admitting: Emergency Medicine

## 2016-07-19 ENCOUNTER — Encounter (HOSPITAL_COMMUNITY): Payer: Self-pay | Admitting: *Deleted

## 2016-07-19 DIAGNOSIS — Z96642 Presence of left artificial hip joint: Secondary | ICD-10-CM | POA: Insufficient documentation

## 2016-07-19 DIAGNOSIS — K219 Gastro-esophageal reflux disease without esophagitis: Secondary | ICD-10-CM | POA: Diagnosis not present

## 2016-07-19 DIAGNOSIS — Z7982 Long term (current) use of aspirin: Secondary | ICD-10-CM | POA: Insufficient documentation

## 2016-07-19 DIAGNOSIS — I10 Essential (primary) hypertension: Secondary | ICD-10-CM | POA: Diagnosis not present

## 2016-07-19 DIAGNOSIS — R1013 Epigastric pain: Secondary | ICD-10-CM | POA: Diagnosis present

## 2016-07-19 LAB — COMPREHENSIVE METABOLIC PANEL
ALT: 18 U/L (ref 14–54)
AST: 27 U/L (ref 15–41)
Albumin: 3.9 g/dL (ref 3.5–5.0)
Alkaline Phosphatase: 84 U/L (ref 38–126)
Anion gap: 6 (ref 5–15)
BUN: 23 mg/dL — AB (ref 6–20)
CALCIUM: 9.7 mg/dL (ref 8.9–10.3)
CHLORIDE: 101 mmol/L (ref 101–111)
CO2: 30 mmol/L (ref 22–32)
CREATININE: 0.83 mg/dL (ref 0.44–1.00)
Glucose, Bld: 108 mg/dL — ABNORMAL HIGH (ref 65–99)
POTASSIUM: 3.6 mmol/L (ref 3.5–5.1)
Sodium: 137 mmol/L (ref 135–145)
TOTAL PROTEIN: 7 g/dL (ref 6.5–8.1)
Total Bilirubin: 0.7 mg/dL (ref 0.3–1.2)

## 2016-07-19 LAB — CBC
HCT: 36 % (ref 36.0–46.0)
Hemoglobin: 11.6 g/dL — ABNORMAL LOW (ref 12.0–15.0)
MCH: 29.3 pg (ref 26.0–34.0)
MCHC: 32.2 g/dL (ref 30.0–36.0)
MCV: 90.9 fL (ref 78.0–100.0)
Platelets: 184 10*3/uL (ref 150–400)
RBC: 3.96 MIL/uL (ref 3.87–5.11)
RDW: 13.6 % (ref 11.5–15.5)
WBC: 5.8 10*3/uL (ref 4.0–10.5)

## 2016-07-19 LAB — LIPASE, BLOOD: LIPASE: 32 U/L (ref 11–51)

## 2016-07-19 MED ORDER — PANTOPRAZOLE SODIUM 20 MG PO TBEC: 20.0000 mg | DELAYED_RELEASE_TABLET | Freq: Every day | ORAL | 0 refills | Status: AC

## 2016-07-19 MED ORDER — SUCRALFATE 1 G PO TABS: 1.0000 g | ORAL_TABLET | Freq: Four times a day (QID) | ORAL | 0 refills | Status: AC

## 2016-07-19 NOTE — Discharge Instructions (Signed)
Return here at once for fever, vomiting, severe pain

## 2016-07-19 NOTE — ED Triage Notes (Signed)
Pt complains of lower abdominal pain x 2 weeks. Pt denies n/v/d. Pt denies any GI issues. Pt denies urinary symptoms.

## 2016-07-19 NOTE — ED Provider Notes (Signed)
[facility] DEPT Provider Note   CSN: [id number] Arrival date & time: 07/19/16  2149  By signing my name below, I, Oleh Genin, attest that this documentation has been prepared under the direction and in the presence of Lacretia Leigh, MD. Electronically Signed: Oleh Genin, Scribe. 07/19/16. 11:32 PM.   History   Chief Complaint Chief Complaint  Patient presents with  . Abdominal Pain    HPI Christina Peterson is a 76 y.o. female with history of HTN and HLD who presents to the ED for evaluation of abdominal pain. This patient states that in the last 2 weeks she has experienced epigastric abdominal "pressure" following meals only. She states that when she eats greasy foods it is particularly noticeable. She attempted Pepto Bismol today without improvement.  No fever or vomiting. No frank rectal bleeding or melena. Last bowel movement was 12 hours ago and normal. No dysuria or frequency.   The history is provided by the patient. No language interpreter was used.    Past Medical History:  Diagnosis Date  . Arthritis   . Hypercholesteremia   . Hypertension    FOLLOWED BY DR [facility]    Patient Active Problem List   Diagnosis Date Noted  . Pain in left hip 06/05/2016    Past Surgical History:  Procedure Laterality Date  . ABDOMINAL HYSTERECTOMY    . JOINT REPLACEMENT    . 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 TOTAL HIP REVISION  05/02/2011   Procedure: TOTAL HIP REVISION;  Surgeon: Sharmon Revere, MD;  Location: [facility];  Service: Orthopedics;  Laterality: Left;  left total hip revision with bone grafting    OB History    No data available       Home Medications    Prior to Admission medications   Medication Sig Start Date End Date Taking? Authorizing Provider  aspirin EC 81 MG tablet Take 81 mg by mouth daily.    Historical Provider, MD  benazepril-hydrochlorthiazide (LOTENSIN HCT) 20-12.5 MG per tablet Take 1 tablet by mouth 3 (three) times a week.  Tuesday, Thursday and saturday    Historical Provider, MD  benzonatate (TESSALON) 100 MG capsule Take 1 capsule (100 mg total) by mouth every 8 (eight) hours. Patient not taking: Reported on 06/05/2016 10/01/15   Comer Locket, PA-C  Calcium Citrate-Vitamin D (CITRACAL MAXIMUM PO) Take 2 tablets by mouth daily.     Historical Provider, MD  cilostazol (PLETAL) 100 MG tablet Take 100 mg by mouth 2 (two) times daily as needed (circulation).     Historical Provider, MD  etodolac (LODINE) 400 MG tablet Take 400 mg by mouth 2 (two) times daily.    Historical Provider, MD  HYDROcodone-acetaminophen (NORCO/VICODIN) 5-325 MG per tablet Take 1 tablet by mouth every 6 (six) hours as needed for severe pain. Patient not taking: Reported on 06/05/2016 12/09/14   Varney Biles, MD  meloxicam (MOBIC) 7.5 MG tablet  03/04/16   Historical Provider, MD  Omega-3 Fatty Acids (FISH OIL PO) Take 1 capsule by mouth 2 (two) times a week.    Historical Provider, MD  simvastatin (ZOCOR) 10 MG tablet Take 10 mg by mouth at bedtime.      Historical Provider, MD    Family History No family history on file.  Social History Social History  Substance Use Topics  . Smoking status: Never Smoker  . Smokeless tobacco: Never Used  . Alcohol use No     Allergies   Patient  has no known allergies.   Review of Systems Review of Systems  Constitutional: Negative for fever.  Cardiovascular: Negative for chest pain.  Gastrointestinal: Positive for abdominal pain. Negative for blood in stool, constipation and vomiting.  Genitourinary: Negative for dysuria and frequency.  All other systems reviewed and are negative.    Physical Exam Updated Vital Signs BP 126/72 (BP Location: Left Arm)   Pulse 69   Temp 97.5 F (36.4 C) (Oral)   SpO2 98%   Physical Exam  Constitutional: She is oriented to person, place, and time. She appears well-developed and well-nourished.  Non-toxic appearance. No distress.  HENT:  Head:  Normocephalic and atraumatic.  Eyes: Conjunctivae, EOM and lids are normal. Pupils are equal, round, and reactive to light.  Neck: Normal range of motion. Neck supple. No tracheal deviation present. No thyroid mass present.  Cardiovascular: Normal rate, regular rhythm and normal heart sounds.  Exam reveals no gallop.   No murmur heard. Pulmonary/Chest: Effort normal and breath sounds normal. No stridor. No respiratory distress. She has no decreased breath sounds. She has no wheezes. She has no rhonchi. She has no rales.  Abdominal: Soft. Normal appearance and bowel sounds are normal. She exhibits no distension. There is no tenderness. There is no rebound and no CVA tenderness.  Musculoskeletal: Normal range of motion. She exhibits no edema or tenderness.  Neurological: She is alert and oriented to person, place, and time. She has normal strength. No cranial nerve deficit or sensory deficit. GCS eye subscore is 4. GCS verbal subscore is 5. GCS motor subscore is 6.  Skin: Skin is warm and dry. No abrasion and no rash noted.  Psychiatric: She has a normal mood and affect. Her speech is normal and behavior is normal.  Nursing note and vitals reviewed.    ED Treatments / Results  Labs (all labs ordered are listed, but only abnormal results are displayed) Labs Reviewed  COMPREHENSIVE METABOLIC PANEL - Abnormal; Notable for the following:       Result Value   Glucose, Bld 108 (*)    BUN 23 (*)    All other components within normal limits  CBC - Abnormal; Notable for the following:    Hemoglobin 11.6 (*)    All other components within normal limits  LIPASE, BLOOD  URINALYSIS, ROUTINE W REFLEX MICROSCOPIC    EKG  EKG Interpretation None       Radiology No results found.  Procedures Procedures (including critical care time)  Medications Ordered in ED Medications - No data to display   Initial Impression / Assessment and Plan / ED Course  I have reviewed the triage vital signs  and the nursing notes.  Pertinent labs & imaging results that were available during my care of the patient were reviewed by me and considered in my medical decision making (see chart for details).     Patient with likely GERD. No concern for ACS at this time. Prescribe PPI and Carafate and return precautions given.  Final Clinical Impressions(s) / ED Diagnoses   Final diagnoses:  None    New Prescriptions New Prescriptions   No medications on file  I personally performed the services described in this documentation, which was scribed in my presence. The recorded information has been reviewed and is accurate.      Lacretia Leigh, MD 07/19/16 [id number]

## 2016-08-09 ENCOUNTER — Emergency Department (HOSPITAL_COMMUNITY)
Admission: EM | Admit: 2016-08-09 | Discharge: 2016-08-09 | Disposition: A | Discharge status: Home / Self Care | Payer: Medicare Other | Source: Home / Self Care | Attending: Emergency Medicine | Admitting: Emergency Medicine

## 2016-08-09 ENCOUNTER — Emergency Department (HOSPITAL_COMMUNITY): Payer: Medicare Other

## 2016-08-09 ENCOUNTER — Encounter (HOSPITAL_COMMUNITY): Payer: Self-pay

## 2016-08-09 DIAGNOSIS — Z7982 Long term (current) use of aspirin: Secondary | ICD-10-CM | POA: Insufficient documentation

## 2016-08-09 DIAGNOSIS — Z79899 Other long term (current) drug therapy: Secondary | ICD-10-CM | POA: Insufficient documentation

## 2016-08-09 DIAGNOSIS — R1013 Epigastric pain: Secondary | ICD-10-CM

## 2016-08-09 DIAGNOSIS — Z96642 Presence of left artificial hip joint: Secondary | ICD-10-CM

## 2016-08-09 DIAGNOSIS — E871 Hypo-osmolality and hyponatremia: Secondary | ICD-10-CM | POA: Diagnosis not present

## 2016-08-09 DIAGNOSIS — I10 Essential (primary) hypertension: Secondary | ICD-10-CM | POA: Insufficient documentation

## 2016-08-09 DIAGNOSIS — R1010 Upper abdominal pain, unspecified: Secondary | ICD-10-CM

## 2016-08-09 LAB — COMPREHENSIVE METABOLIC PANEL
ALBUMIN: 3.5 g/dL (ref 3.5–5.0)
ALK PHOS: 77 U/L (ref 38–126)
ALT: 18 U/L (ref 14–54)
AST: 30 U/L (ref 15–41)
Anion gap: 9 (ref 5–15)
BILIRUBIN TOTAL: 0.7 mg/dL (ref 0.3–1.2)
BUN: 10 mg/dL (ref 6–20)
CO2: 29 mmol/L (ref 22–32)
Calcium: 9.5 mg/dL (ref 8.9–10.3)
Chloride: 96 mmol/L — ABNORMAL LOW (ref 101–111)
Creatinine, Ser: 0.72 mg/dL (ref 0.44–1.00)
GFR calc Af Amer: >60 mL/min (ref >60)
GFR calc non Af Amer: >60 mL/min (ref >60)
GLUCOSE: 100 mg/dL — AB (ref 65–99)
POTASSIUM: 3.5 mmol/L (ref 3.5–5.1)
Sodium: 134 mmol/L — ABNORMAL LOW (ref 135–145)
TOTAL PROTEIN: 6.5 g/dL (ref 6.5–8.1)

## 2016-08-09 LAB — CBC
HEMATOCRIT: 34.6 % — AB (ref 36.0–46.0)
Hemoglobin: 11.3 g/dL — ABNORMAL LOW (ref 12.0–15.0)
MCH: 28.8 pg (ref 26.0–34.0)
MCHC: 32.7 g/dL (ref 30.0–36.0)
MCV: 88.3 fL (ref 78.0–100.0)
Platelets: 240 10*3/uL (ref 150–400)
RBC: 3.92 MIL/uL (ref 3.87–5.11)
RDW: 13.4 % (ref 11.5–15.5)
WBC: 4.7 10*3/uL (ref 4.0–10.5)

## 2016-08-09 LAB — TROPONIN I: Troponin I: <0.03 ng/mL (ref <0.03)

## 2016-08-09 LAB — LIPASE, BLOOD: Lipase: 22 U/L (ref 11–51)

## 2016-08-09 MED ORDER — HYDROCODONE-ACETAMINOPHEN 5-325 MG PO TABS: 1.0000 | ORAL_TABLET | ORAL | 0 refills | Status: DC | PRN

## 2016-08-09 MED ORDER — MORPHINE SULFATE (PF) 4 MG/ML IV SOLN
2.0000 mg | Freq: Once | INTRAVENOUS | Status: AC
  Administered 2016-08-09: 2 mg via INTRAVENOUS
  Filled 2016-08-09: qty 1

## 2016-08-09 MED ORDER — DOCUSATE SODIUM 100 MG PO CAPS: 100.0000 mg | ORAL_CAPSULE | Freq: Two times a day (BID) | ORAL | 0 refills | Status: DC

## 2016-08-09 MED ORDER — GADOBENATE DIMEGLUMINE 529 MG/ML IV SOLN
10.0000 mL | Freq: Once | INTRAVENOUS | Status: AC | PRN
  Administered 2016-08-09: 10 mL via INTRAVENOUS

## 2016-08-09 MED ORDER — ONDANSETRON HCL 4 MG/2ML IJ SOLN
4.0000 mg | Freq: Once | INTRAMUSCULAR | Status: AC
  Administered 2016-08-09: 4 mg via INTRAVENOUS
  Filled 2016-08-09: qty 2

## 2016-08-09 NOTE — ED Notes (Signed)
Pt aware of need for urine  

## 2016-08-09 NOTE — Discharge Instructions (Signed)
The cancer center will call you next week to schedule an outpatient biopsy. Return here if you develop severe abdominal pain, vomiting, fever, or any other problems

## 2016-08-09 NOTE — ED Notes (Signed)
Patient transported to MRI 

## 2016-08-09 NOTE — ED Provider Notes (Signed)
[facility] DEPT Provider Note   CSN: [id number] Arrival date & time: 08/09/16  8101     History   Chief Complaint Chief Complaint  Patient presents with  . Abdominal Pain    HPI Christina Peterson is a 76 y.o. female.  76 year old female presents with recurrent epigastric abdominal pain degrees to her back. I saw patient 2 weeks ago for similar symptoms and prescribed to her a PPI and Carafate which she said did help initially. She saw her primary care doctor for follow-up and agree with this. She was being treated for GERD. States last eye symptoms became worse and described as a pressure type sensation without cardiac symptoms. No fever, vomiting, diarrhea. No urinary symptoms. No new treatment used for this prior to arrival.      Past Medical History:  Diagnosis Date  . Arthritis   . Hypercholesteremia   . Hypertension    FOLLOWED BY DR [facility]    Patient Active Problem List   Diagnosis Date Noted  . Pain in left hip 06/05/2016    Past Surgical History:  Procedure Laterality Date  . ABDOMINAL HYSTERECTOMY    . JOINT REPLACEMENT    . 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 TOTAL HIP REVISION  05/02/2011   Procedure: TOTAL HIP REVISION;  Surgeon: Sharmon Revere, MD;  Location: [facility];  Service: Orthopedics;  Laterality: Left;  left total hip revision with bone grafting    OB History    No data available       Home Medications    Prior to Admission medications   Medication Sig Start Date End Date Taking? Authorizing Provider  aspirin EC 81 MG tablet Take 81 mg by mouth daily.    Historical Provider, MD  benazepril-hydrochlorthiazide (LOTENSIN HCT) 20-12.5 MG per tablet Take 1 tablet by mouth 3 (three) times a week. Tuesday, Thursday and saturday    Historical Provider, MD  benzonatate (TESSALON) 100 MG capsule Take 1 capsule (100 mg total) by mouth every 8 (eight) hours. Patient not taking: Reported on 06/05/2016 10/01/15   Comer Locket, PA-C  Calcium  Citrate-Vitamin D (CITRACAL MAXIMUM PO) Take 2 tablets by mouth daily.     Historical Provider, MD  cilostazol (PLETAL) 100 MG tablet Take 100 mg by mouth 2 (two) times daily as needed (circulation).     Historical Provider, MD  etodolac (LODINE) 400 MG tablet Take 400 mg by mouth 2 (two) times daily.    Historical Provider, MD  HYDROcodone-acetaminophen (NORCO/VICODIN) 5-325 MG per tablet Take 1 tablet by mouth every 6 (six) hours as needed for severe pain. Patient not taking: Reported on 06/05/2016 12/09/14   Varney Biles, MD  meloxicam (MOBIC) 7.5 MG tablet  03/04/16   Historical Provider, MD  Omega-3 Fatty Acids (FISH OIL PO) Take 1 capsule by mouth 2 (two) times a week.    Historical Provider, MD  pantoprazole (PROTONIX) 20 MG tablet Take 1 tablet (20 mg total) by mouth daily. 07/19/16   Lacretia Leigh, MD  simvastatin (ZOCOR) 10 MG tablet Take 10 mg by mouth at bedtime.      Historical Provider, MD  sucralfate (CARAFATE) 1 g tablet Take 1 tablet (1 g total) by mouth 4 (four) times daily. 07/19/16   Lacretia Leigh, MD    Family History History reviewed. No pertinent family history.  Social History Social History  Substance Use Topics  . Smoking status: Never Smoker  . Smokeless tobacco: Never Used  . Alcohol  use No     Allergies   Patient has no known allergies.   Review of Systems Review of Systems  All other systems reviewed and are negative.    Physical Exam Updated Vital Signs BP (!) 135/109 (BP Location: Left Arm)   Pulse 68   Temp 98.2 F (36.8 C) (Oral)   Resp 16   SpO2 97%   Physical Exam  Constitutional: She is oriented to person, place, and time. She appears well-developed and well-nourished.  Non-toxic appearance. No distress.  HENT:  Head: Normocephalic and atraumatic.  Eyes: Conjunctivae, EOM and lids are normal. Pupils are equal, round, and reactive to light.  Neck: Normal range of motion. Neck supple. No tracheal deviation present. No thyroid mass  present.  Cardiovascular: Normal rate, regular rhythm and normal heart sounds.  Exam reveals no gallop.   No murmur heard. Pulmonary/Chest: Effort normal and breath sounds normal. No stridor. No respiratory distress. She has no decreased breath sounds. She has no wheezes. She has no rhonchi. She has no rales.  Abdominal: Soft. Normal appearance and bowel sounds are normal. She exhibits no distension. There is tenderness in the epigastric area. There is no rigidity, no rebound, no guarding and no CVA tenderness.    Musculoskeletal: Normal range of motion. She exhibits no edema or tenderness.  Neurological: She is alert and oriented to person, place, and time. She has normal strength. No cranial nerve deficit or sensory deficit. GCS eye subscore is 4. GCS verbal subscore is 5. GCS motor subscore is 6.  Skin: Skin is warm and dry. No abrasion and no rash noted.  Psychiatric: She has a normal mood and affect. Her speech is normal and behavior is normal.  Nursing note and vitals reviewed.    ED Treatments / Results  Labs (all labs ordered are listed, but only abnormal results are displayed) Labs Reviewed  LIPASE, BLOOD  COMPREHENSIVE METABOLIC PANEL  CBC  URINALYSIS, ROUTINE W REFLEX MICROSCOPIC    EKG  EKG Interpretation None       Radiology No results found.  Procedures Procedures (including critical care time)  Medications Ordered in ED Medications  morphine 4 MG/ML injection 2 mg (not administered)  ondansetron (ZOFRAN) injection 4 mg (not administered)     Initial Impression / Assessment and Plan / ED Course  I have reviewed the triage vital signs and the nursing notes.  Pertinent labs & imaging results that were available during my care of the patient were reviewed by me and considered in my medical decision making (see chart for details).     The patient medicate for pain and feels better. Abdominal ultrasound showed signs of masses in the patient's organs and  recommended an MRI for evaluation of possible malignancy. 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 Case discussed with Dr. Martha Clan from oncology and he will contact the cancer Center to schedule outpatient biopsy and further follow-up. Family notified as well as patient as well as given return precautions  Final Clinical Impressions(s) / ED Diagnoses   Final diagnoses:  None    New Prescriptions New Prescriptions   No medications on file     Lacretia Leigh, MD 08/09/16 1402

## 2016-08-09 NOTE — ED Notes (Signed)
Pt reminded of need for urine 

## 2016-08-09 NOTE — ED Triage Notes (Signed)
Pt reports worsening 10/10 abd pain and nausea w/o emesis. Pt reports Rx meds are not relieving her abd pain. Pt denies diarrhea. Pt A+OX4.

## 2016-08-10 ENCOUNTER — Inpatient Hospital Stay (HOSPITAL_COMMUNITY)
Admission: EM | Admit: 2016-08-10 | Discharge: 2016-08-15 | DRG: 628 | Disposition: A | Discharge status: Home / Self Care | Payer: Medicare Other | Attending: Internal Medicine | Admitting: Internal Medicine

## 2016-08-10 ENCOUNTER — Encounter (HOSPITAL_COMMUNITY): Payer: Self-pay | Admitting: Emergency Medicine

## 2016-08-10 DIAGNOSIS — N2889 Other specified disorders of kidney and ureter: Secondary | ICD-10-CM | POA: Diagnosis present

## 2016-08-10 DIAGNOSIS — R599 Enlarged lymph nodes, unspecified: Secondary | ICD-10-CM

## 2016-08-10 DIAGNOSIS — C641 Malignant neoplasm of right kidney, except renal pelvis: Secondary | ICD-10-CM | POA: Diagnosis present

## 2016-08-10 DIAGNOSIS — K5903 Drug induced constipation: Secondary | ICD-10-CM | POA: Diagnosis present

## 2016-08-10 DIAGNOSIS — Z6820 Body mass index (BMI) 20.0-20.9, adult: Secondary | ICD-10-CM

## 2016-08-10 DIAGNOSIS — R590 Localized enlarged lymph nodes: Secondary | ICD-10-CM | POA: Diagnosis present

## 2016-08-10 DIAGNOSIS — E7800 Pure hypercholesterolemia, unspecified: Secondary | ICD-10-CM | POA: Diagnosis present

## 2016-08-10 DIAGNOSIS — C8510 Unspecified B-cell lymphoma, unspecified site: Secondary | ICD-10-CM | POA: Diagnosis present

## 2016-08-10 DIAGNOSIS — R109 Unspecified abdominal pain: Secondary | ICD-10-CM | POA: Diagnosis present

## 2016-08-10 DIAGNOSIS — Z96642 Presence of left artificial hip joint: Secondary | ICD-10-CM | POA: Diagnosis present

## 2016-08-10 DIAGNOSIS — I10 Essential (primary) hypertension: Secondary | ICD-10-CM | POA: Diagnosis present

## 2016-08-10 DIAGNOSIS — E785 Hyperlipidemia, unspecified: Secondary | ICD-10-CM | POA: Diagnosis present

## 2016-08-10 DIAGNOSIS — R1909 Other intra-abdominal and pelvic swelling, mass and lump: Secondary | ICD-10-CM | POA: Diagnosis present

## 2016-08-10 DIAGNOSIS — Z791 Long term (current) use of non-steroidal anti-inflammatories (NSAID): Secondary | ICD-10-CM

## 2016-08-10 DIAGNOSIS — E860 Dehydration: Secondary | ICD-10-CM | POA: Diagnosis present

## 2016-08-10 DIAGNOSIS — R591 Generalized enlarged lymph nodes: Secondary | ICD-10-CM

## 2016-08-10 DIAGNOSIS — K5900 Constipation, unspecified: Secondary | ICD-10-CM

## 2016-08-10 DIAGNOSIS — Z7982 Long term (current) use of aspirin: Secondary | ICD-10-CM

## 2016-08-10 DIAGNOSIS — E871 Hypo-osmolality and hyponatremia: Principal | ICD-10-CM

## 2016-08-10 DIAGNOSIS — E43 Unspecified severe protein-calorie malnutrition: Secondary | ICD-10-CM

## 2016-08-10 DIAGNOSIS — D649 Anemia, unspecified: Secondary | ICD-10-CM | POA: Diagnosis present

## 2016-08-10 DIAGNOSIS — M1990 Unspecified osteoarthritis, unspecified site: Secondary | ICD-10-CM | POA: Diagnosis present

## 2016-08-10 DIAGNOSIS — T402X5A Adverse effect of other opioids, initial encounter: Secondary | ICD-10-CM | POA: Diagnosis present

## 2016-08-10 DIAGNOSIS — R0609 Other forms of dyspnea: Secondary | ICD-10-CM | POA: Diagnosis present

## 2016-08-10 LAB — COMPREHENSIVE METABOLIC PANEL
ALT: 19 U/L (ref 14–54)
AST: 31 U/L (ref 15–41)
Albumin: 3.7 g/dL (ref 3.5–5.0)
Alkaline Phosphatase: 75 U/L (ref 38–126)
Anion gap: 11 (ref 5–15)
BUN: 11 mg/dL (ref 6–20)
CHLORIDE: 87 mmol/L — AB (ref 101–111)
CO2: 27 mmol/L (ref 22–32)
CREATININE: 0.65 mg/dL (ref 0.44–1.00)
Calcium: 9.2 mg/dL (ref 8.9–10.3)
Glucose, Bld: 108 mg/dL — ABNORMAL HIGH (ref 65–99)
POTASSIUM: 3.4 mmol/L — AB (ref 3.5–5.1)
Sodium: 125 mmol/L — ABNORMAL LOW (ref 135–145)
Total Bilirubin: 0.8 mg/dL (ref 0.3–1.2)
Total Protein: 6.7 g/dL (ref 6.5–8.1)

## 2016-08-10 LAB — CBC WITH DIFFERENTIAL/PLATELET
Basophils Absolute: 0 10*3/uL (ref 0.0–0.1)
Basophils Relative: 0 %
EOS ABS: 0.1 10*3/uL (ref 0.0–0.7)
Eosinophils Relative: 1 %
HCT: 33.1 % — ABNORMAL LOW (ref 36.0–46.0)
HEMOGLOBIN: 11 g/dL — AB (ref 12.0–15.0)
LYMPHS ABS: 0.8 10*3/uL (ref 0.7–4.0)
Lymphocytes Relative: 9 %
MCH: 29.2 pg (ref 26.0–34.0)
MCHC: 33.2 g/dL (ref 30.0–36.0)
MCV: 87.8 fL (ref 78.0–100.0)
Monocytes Absolute: 0.5 10*3/uL (ref 0.1–1.0)
Monocytes Relative: 6 %
NEUTROS PCT: 84 %
Neutro Abs: 7 10*3/uL (ref 1.7–7.7)
Platelets: 246 10*3/uL (ref 150–400)
RBC: 3.77 MIL/uL — AB (ref 3.87–5.11)
RDW: 13 % (ref 11.5–15.5)
WBC: 8.3 10*3/uL (ref 4.0–10.5)

## 2016-08-10 LAB — LIPASE, BLOOD: Lipase: 19 U/L (ref 11–51)

## 2016-08-10 MED ORDER — ONDANSETRON HCL 4 MG/2ML IJ SOLN
4.0000 mg | Freq: Once | INTRAMUSCULAR | Status: AC
  Administered 2016-08-10: 4 mg via INTRAVENOUS
  Filled 2016-08-10: qty 2

## 2016-08-10 MED ORDER — MORPHINE SULFATE (PF) 4 MG/ML IV SOLN
4.0000 mg | Freq: Once | INTRAVENOUS | Status: AC
  Administered 2016-08-10: 4 mg via INTRAVENOUS
  Filled 2016-08-10: qty 1

## 2016-08-10 NOTE — ED Triage Notes (Signed)
Pt reports having increasing abd and back pain. Pt was dx with tumors in abdomen on 08/09/16 and to be followed up with cancer center. Pt reports worsening pain since then.

## 2016-08-10 NOTE — ED Notes (Signed)
Pt attempted to give urine sample, but was not able to.

## 2016-08-10 NOTE — ED Provider Notes (Signed)
[facility] DEPT Provider Note   CSN: [id number] Arrival date & time: 08/10/16  2138  By signing my name below, I, Oleh Genin, attest that this documentation has been prepared under the direction and in the presence of Sherwood Gambler, MD. Electronically Signed: Oleh Genin, Scribe. 08/10/16. 11:13 PM.   History   Chief Complaint Chief Complaint  Patient presents with  . Abdominal Pain  . Back Pain    HPI ANNTIONETTE MADKINS is a 76 y.o. female with history of hypertension and hyperlipidemia who presents to the ED for evaluation of abdominal pain. This patient states that for the last "months" she has been experiencing epigastric abdominal pain. She was seen yesterday at this facility where she received an MR abdomen that was questionable for lymphoma and renal cell carcinoma. She was discharged to followup outpatient with oncology. She re-presents today because her pain symptoms are intolerable. She was given narcotic pain therapy (hydrocodone) on discharge; but she states it is not helping. She is also reporting nausea and difficulty stooling. No vomiting. No chest pain or dyspnea.  The history is provided by the patient. No language interpreter was used.  Abdominal Pain   This is a recurrent problem. The current episode started yesterday. The problem occurs constantly. The problem has been gradually worsening. The pain is located in the epigastric region and LUQ. The pain is severe. Associated symptoms include nausea and constipation. Pertinent negatives include vomiting.    Past Medical History:  Diagnosis Date  . Arthritis   . Hypercholesteremia   . Hypertension    FOLLOWED BY DR [facility]    Patient Active Problem List   Diagnosis Date Noted  . Hyponatremia 08/11/2016  . Pain in left hip 06/05/2016    Past Surgical History:  Procedure Laterality Date  . ABDOMINAL HYSTERECTOMY    . JOINT REPLACEMENT    . 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 TOTAL HIP  REVISION  05/02/2011   Procedure: TOTAL HIP REVISION;  Surgeon: Sharmon Revere, MD;  Location: [facility];  Service: Orthopedics;  Laterality: Left;  left total hip revision with bone grafting    OB History    No data available       Home Medications    Prior to Admission medications   Medication Sig Start Date End Date Taking? Authorizing Provider  aspirin EC 81 MG tablet Take 81 mg by mouth daily.   Yes Historical Provider, MD  benazepril-hydrochlorthiazide (LOTENSIN HCT) 20-12.5 MG per tablet Take 1 tablet by mouth daily. Tuesday, Thursday and saturday   Yes Historical Provider, MD  Calcium Citrate-Vitamin D (CITRACAL MAXIMUM PO) Take 2 tablets by mouth daily.    Yes Historical Provider, MD  docusate sodium (COLACE) 100 MG capsule Take 1 capsule (100 mg total) by mouth every 12 (twelve) hours. 08/09/16  Yes Lacretia Leigh, MD  HYDROcodone-acetaminophen (NORCO/VICODIN) 5-325 MG tablet Take 1-2 tablets by mouth every 4 (four) hours as needed. Patient taking differently: Take 1-2 tablets by mouth every 4 (four) hours as needed for moderate pain or severe pain.  08/09/16  Yes Lacretia Leigh, MD  meloxicam (MOBIC) 7.5 MG tablet Take 7.5 mg by mouth daily.  03/04/16  Yes Historical Provider, MD  naproxen sodium (ANAPROX) 220 MG tablet Take 220 mg by mouth every 12 (twelve) hours as needed (pain).    Yes Historical Provider, MD  pantoprazole (PROTONIX) 20 MG tablet Take 1 tablet (20 mg total) by mouth daily. 07/19/16  Yes Lacretia Leigh,  MD  simvastatin (ZOCOR) 10 MG tablet Take 10 mg by mouth at bedtime.     Yes Historical Provider, MD  sucralfate (CARAFATE) 1 g tablet Take 1 tablet (1 g total) by mouth 4 (four) times daily. 07/19/16  Yes Lacretia Leigh, MD  benzonatate (TESSALON) 100 MG capsule Take 1 capsule (100 mg total) by mouth every 8 (eight) hours. Patient not taking: Reported on 06/05/2016 10/01/15   Comer Locket, PA-C  HYDROcodone-acetaminophen (NORCO/VICODIN) 5-325 MG per tablet Take 1 tablet  by mouth every 6 (six) hours as needed for severe pain. Patient not taking: Reported on 06/05/2016 12/09/14   Varney Biles, MD    Family History History reviewed. No pertinent family history.  Social History Social History  Substance Use Topics  . Smoking status: Never Smoker  . Smokeless tobacco: Never Used  . Alcohol use No     Allergies   Patient has no known allergies.   Review of Systems Review of Systems  Gastrointestinal: Positive for abdominal pain, constipation and nausea. Negative for vomiting.  All other systems reviewed and are negative.    Physical Exam Updated Vital Signs BP (!) 141/72 (BP Location: Left Arm)   Pulse (!) 58   Temp 97.8 F (36.6 C) (Oral)   Resp 18   Ht 5\' 1"  (1.549 m)   Wt 115 lb (52.2 kg)   SpO2 97%   BMI 21.73 kg/m   Physical Exam  Constitutional: She is oriented to person, place, and time. She appears well-developed and well-nourished.  HENT:  Head: Normocephalic and atraumatic.  Right Ear: External ear normal.  Left Ear: External ear normal.  Nose: Nose normal.  Eyes: Right eye exhibits no discharge. Left eye exhibits no discharge.  Cardiovascular: Normal rate and regular rhythm.   Murmur heard. Pulmonary/Chest: Effort normal and breath sounds normal.  Abdominal: Soft. There is tenderness in the epigastric area and left upper quadrant.  Neurological: She is alert and oriented to person, place, and time.  CN 3-12 grossly intact. 5/5 strength in all 4 extremities. Grossly normal sensation. Normal finger to nose.   Skin: Skin is warm and dry.  Nursing note and vitals reviewed.    ED Treatments / Results  Labs (all labs ordered are listed, but only abnormal results are displayed) Labs Reviewed  COMPREHENSIVE METABOLIC PANEL - Abnormal; Notable for the following:       Result Value   Sodium 125 (*)    Potassium 3.4 (*)    Chloride 87 (*)    Glucose, Bld 108 (*)    All other components within normal limits  CBC WITH  DIFFERENTIAL/PLATELET - Abnormal; Notable for the following:    RBC 3.77 (*)    Hemoglobin 11.0 (*)    HCT 33.1 (*)    All other components within normal limits  BASIC METABOLIC PANEL - Abnormal; Notable for the following:    Sodium 125 (*)    Chloride 88 (*)    Glucose, Bld 101 (*)    All other components within normal limits  LIPASE, BLOOD  URINALYSIS, ROUTINE W REFLEX MICROSCOPIC  SODIUM, URINE, RANDOM  OSMOLALITY, URINE  OSMOLALITY    EKG  EKG Interpretation None       Radiology Mr Abdomen W Or Wo Contrast  Result Date: 08/09/2016 CLINICAL DATA:  Abdominal mass. Abdominal pain. Nausea and vomiting. EXAM: MRI ABDOMEN WITHOUT AND WITH CONTRAST TECHNIQUE: Multiplanar multisequence MR imaging of the abdomen was performed both before and after the administration of intravenous contrast.  CONTRAST:  72mL MULTIHANCE GADOBENATE DIMEGLUMINE 529 MG/ML IV SOLN COMPARISON:  Ultrasound 08/09/2016 FINDINGS: Lower chest: Lung bases are clear. Hepatobiliary: No focal hepatic lesion. Large nodal mass in the porta hepatis measuring 6.0 x 4.2 cm is most consistent with large periportal lymph node. There is no biliary duct dilatation. Gallbladder normal. Common bile duct is difficult to follow. Portal veins are patent. Pancreas: Pancreas is difficult to evaluate. There is bulky adenopathy within the mesenteries adjacent the pancreas which makes defining the pancreatic tissue difficult. No discrete pancreatic lesion is identified. Spleen: Single 1.6 cm lesion within the spleen. Cannot exclude lymphoma lesion within the spleen. Spleen is normal volume.) Adrenals/urinary tract: Adrenal glands are normal. Along the lateral cortex of the inferior RIGHT kidney there is a 3.4 x 4.0 cm enhancing mass (image 55, series 1501. This is clearly depicted on T2 weighted image 34, series 3. Stomach/Bowel: Stomach, small-bowel in limited view the: Unremarkable. No bowel obstruction Vascular/Lymphatic: Abdominal aorta  normal caliber. There is bulky adenopathy within the small bowel mesenteries centrally. Coronal image 12 series 10 and demonstrates no masses well. Mass lesions also demonstrated well on the eighth diffusion-weighted Ob 500 series. Example noted the LEFT upper abdomen measures 3.1 cm short axis. Course periportal nodes again demonstrated. Retroperitoneal nodes LEFT of the aorta measure 1.6 cm short axis (image 51, series 4. Likely large. Caval lymph node measuring 3.0 cm (image 54, series 4 Other: No free fluid. Musculoskeletal: No aggressive osseous lesion. IMPRESSION: 1. 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 Retroperitoneal adenopathy extends along the aorta and IVC 2. Indeterminate lesion within normal volume spleen. 3. Recommend oncology consultation and FDG PET scan for further staging and biopsy planning. 4. Additional 4 cm lesion exophytic from the inferior pole the RIGHT kidney is concerning for RENAL CELL CARCINOMA. Electronically Signed   By: Suzy Bouchard M.D.   On: 08/09/2016 12:35   US Abdomen Complete  Result Date: 08/09/2016 CLINICAL DATA:  Abdominal pain for 3 months EXAM: ABDOMEN ULTRASOUND COMPLETE COMPARISON:  None. FINDINGS: Gallbladder: No gallstones or wall thickening visualized. No sonographic Murphy sign noted by sonographer. Common bile duct: Diameter: Normal caliber, 2 mm Liver: There is a 5.9 x 4.8 x 4.6 cm mixed echotexture solid lesion in the left hepatic lobe. Overall, the lesion is hypoechoic. This is nonspecific and warrants additional characterization. IVC: No abnormality visualized. Pancreas: Enlarged hypoechoic mass in the pancreatic tail measures 8.3 x 7.8 x 5.9 cm. Hypoechoic mass in the region the pancreatic head measures 3.2 x 2.2 x 4.5 cm. Spleen: Normal size. Hypoechoic lesion centrally within the spleen measures 1.9 x 1.5 x 1.3 cm. Right Kidney: Length: 8.1 cm. No hydronephrosis. There is a solid mass off the lower pole of  the right kidney measuring 3.4 x 3.3 x 3.2 cm. 1.6 cm exophytic cyst off the midpole. Left Kidney: Length: 9.8 cm. Echogenicity within normal limits. No mass or hydronephrosis visualized. Abdominal aorta: No aneurysm visualized. Other findings: Within the right lower quadrant, there is a solid mass measuring 5.8 x 3.7 x 3.2 cm of unknown etiology or source. This is adjacent to the aorta. IMPRESSION: Numerous solid organ abnormalities. 5.9 cm hypoechoic solid mass in the left hepatic lobe. At least 2 large masses in the pancreas, both pancreatic head and pancreatic tail. Hypoechoic lesions centrally within the spleen. 3.4 cm solid mass off the lower pole of the right kidney. In addition, there is a large soft tissue mass in the right lower quadrant  of the abdomen adjacent to the aorta measuring up to 5.8 cm. These areas are concerning for malignancy and warrant further evaluation. Recommend MRI of the abdomen without and with contrast for further evaluation. Electronically Signed   By: Rolm Baptise M.D.   On: 08/09/2016 09:24    Procedures Procedures (including critical care time)  Medications Ordered in ED Medications  pantoprazole (PROTONIX) EC tablet 20 mg (not administered)  sucralfate (CARAFATE) tablet 1 g (not administered)  aspirin EC tablet 81 mg (not administered)  meloxicam (MOBIC) tablet 7.5 mg (not administered)  simvastatin (ZOCOR) tablet 10 mg (not administered)  docusate sodium (COLACE) capsule 100 mg (not administered)  enoxaparin (LOVENOX) injection 40 mg (not administered)  ondansetron (ZOFRAN) tablet 4 mg (not administered)    Or  ondansetron (ZOFRAN) injection 4 mg (not administered)  acetaminophen (TYLENOL) tablet 650 mg (not administered)    Or  acetaminophen (TYLENOL) suppository 650 mg (not administered)  albuterol (PROVENTIL) (2.5 MG/3ML) 0.083% nebulizer solution 2.5 mg (not administered)  0.9 %  sodium chloride infusion (not administered)  oxyCODONE-acetaminophen  (PERCOCET/ROXICET) 5-325 MG per tablet 1 tablet (not administered)  morphine 4 MG/ML injection 2 mg (not administered)  ondansetron (ZOFRAN) injection 4 mg (4 mg Intravenous Given 08/10/16 2325)  morphine 4 MG/ML injection 4 mg (4 mg Intravenous Given 08/10/16 2328)     Initial Impression / Assessment and Plan / ED Course  I have reviewed the triage vital signs and the nursing notes.  Pertinent labs & imaging results that were available during my care of the patient were reviewed by me and considered in my medical decision making (see chart for details).     I believe patient's pain is coming from the likely cancer seen on MRI 2 days ago. Pain is much better after IV morphine. However lab work obtained on arrival shows hyponatremia with a sodium level of 125. This is a significant difference from just about 36 hours ago. Unclear why this is significantly different. The first one was drawn off of her EMS IV but when it was rechecked with a straight stick it is still 125. She has been feeling weak and had trouble standing up and feeling a little off balance. No acute strokelike spine and on exam. Due to this, will admit to the hospitalist. Dish he does not look overtly dehydrated. Discussed with Dr. Tamala Julian, asks for urine sodium and urine osmolality and orthostatics. He will admit.  Final Clinical Impressions(s) / ED Diagnoses   Final diagnoses:  Hyponatremia    New Prescriptions New Prescriptions   No medications on file   I personally performed the services described in this documentation, which was scribed in my presence. The recorded information has been reviewed and is accurate.    Sherwood Gambler, MD 08/11/16 [phone]

## 2016-08-11 ENCOUNTER — Observation Stay (HOSPITAL_COMMUNITY): Payer: Medicare Other

## 2016-08-11 DIAGNOSIS — R0609 Other forms of dyspnea: Secondary | ICD-10-CM | POA: Diagnosis present

## 2016-08-11 DIAGNOSIS — Z6820 Body mass index (BMI) 20.0-20.9, adult: Secondary | ICD-10-CM | POA: Diagnosis not present

## 2016-08-11 DIAGNOSIS — E785 Hyperlipidemia, unspecified: Secondary | ICD-10-CM | POA: Diagnosis present

## 2016-08-11 DIAGNOSIS — R591 Generalized enlarged lymph nodes: Secondary | ICD-10-CM | POA: Insufficient documentation

## 2016-08-11 DIAGNOSIS — R590 Localized enlarged lymph nodes: Secondary | ICD-10-CM | POA: Diagnosis present

## 2016-08-11 DIAGNOSIS — R1013 Epigastric pain: Secondary | ICD-10-CM | POA: Diagnosis present

## 2016-08-11 DIAGNOSIS — M1990 Unspecified osteoarthritis, unspecified site: Secondary | ICD-10-CM | POA: Diagnosis present

## 2016-08-11 DIAGNOSIS — D649 Anemia, unspecified: Secondary | ICD-10-CM | POA: Diagnosis present

## 2016-08-11 DIAGNOSIS — R1030 Lower abdominal pain, unspecified: Secondary | ICD-10-CM | POA: Diagnosis not present

## 2016-08-11 DIAGNOSIS — E7800 Pure hypercholesterolemia, unspecified: Secondary | ICD-10-CM | POA: Diagnosis present

## 2016-08-11 DIAGNOSIS — E43 Unspecified severe protein-calorie malnutrition: Secondary | ICD-10-CM | POA: Diagnosis present

## 2016-08-11 DIAGNOSIS — N2889 Other specified disorders of kidney and ureter: Secondary | ICD-10-CM | POA: Diagnosis present

## 2016-08-11 DIAGNOSIS — R599 Enlarged lymph nodes, unspecified: Secondary | ICD-10-CM | POA: Insufficient documentation

## 2016-08-11 DIAGNOSIS — R1909 Other intra-abdominal and pelvic swelling, mass and lump: Secondary | ICD-10-CM

## 2016-08-11 DIAGNOSIS — C8510 Unspecified B-cell lymphoma, unspecified site: Secondary | ICD-10-CM | POA: Diagnosis present

## 2016-08-11 DIAGNOSIS — K5900 Constipation, unspecified: Secondary | ICD-10-CM | POA: Diagnosis present

## 2016-08-11 DIAGNOSIS — K5903 Drug induced constipation: Secondary | ICD-10-CM | POA: Diagnosis present

## 2016-08-11 DIAGNOSIS — E871 Hypo-osmolality and hyponatremia: Secondary | ICD-10-CM | POA: Diagnosis present

## 2016-08-11 DIAGNOSIS — Z791 Long term (current) use of non-steroidal anti-inflammatories (NSAID): Secondary | ICD-10-CM | POA: Diagnosis not present

## 2016-08-11 DIAGNOSIS — T402X5A Adverse effect of other opioids, initial encounter: Secondary | ICD-10-CM | POA: Diagnosis present

## 2016-08-11 DIAGNOSIS — I10 Essential (primary) hypertension: Secondary | ICD-10-CM | POA: Diagnosis present

## 2016-08-11 DIAGNOSIS — C641 Malignant neoplasm of right kidney, except renal pelvis: Secondary | ICD-10-CM | POA: Diagnosis present

## 2016-08-11 DIAGNOSIS — R109 Unspecified abdominal pain: Secondary | ICD-10-CM | POA: Diagnosis present

## 2016-08-11 DIAGNOSIS — Z7982 Long term (current) use of aspirin: Secondary | ICD-10-CM | POA: Diagnosis not present

## 2016-08-11 DIAGNOSIS — E860 Dehydration: Secondary | ICD-10-CM | POA: Diagnosis present

## 2016-08-11 DIAGNOSIS — Z96642 Presence of left artificial hip joint: Secondary | ICD-10-CM | POA: Diagnosis present

## 2016-08-11 LAB — BASIC METABOLIC PANEL
Anion gap: 6 (ref 5–15)
Anion gap: 9 (ref 5–15)
BUN: 8 mg/dL (ref 6–20)
BUN: 11 mg/dL (ref 6–20)
CALCIUM: 8.8 mg/dL — AB (ref 8.9–10.3)
CALCIUM: 9.2 mg/dL (ref 8.9–10.3)
CO2: 28 mmol/L (ref 22–32)
CO2: 32 mmol/L (ref 22–32)
CREATININE: 0.69 mg/dL (ref 0.44–1.00)
CREATININE: 0.68 mg/dL (ref 0.44–1.00)
Chloride: 88 mmol/L — ABNORMAL LOW (ref 101–111)
Chloride: 92 mmol/L — ABNORMAL LOW (ref 101–111)
GFR calc Af Amer: >60 mL/min (ref >60)
GFR calc non Af Amer: >60 mL/min (ref >60)
Glucose, Bld: 101 mg/dL — ABNORMAL HIGH (ref 65–99)
Glucose, Bld: 88 mg/dL (ref 65–99)
Potassium: 3.5 mmol/L (ref 3.5–5.1)
Potassium: 3.6 mmol/L (ref 3.5–5.1)
SODIUM: 130 mmol/L — AB (ref 135–145)
SODIUM: 125 mmol/L — AB (ref 135–145)

## 2016-08-11 LAB — URINALYSIS, ROUTINE W REFLEX MICROSCOPIC
BILIRUBIN URINE: NEGATIVE
GLUCOSE, UA: NEGATIVE mg/dL
KETONES UR: 5 mg/dL — AB
LEUKOCYTES UA: NEGATIVE
NITRITE: NEGATIVE
PH: 5 (ref 5.0–8.0)
PROTEIN: NEGATIVE mg/dL
Specific Gravity, Urine: 1.006 (ref 1.005–1.030)
Squamous Epithelial / LPF: NONE SEEN

## 2016-08-11 LAB — OSMOLALITY: Osmolality: 262 mOsm/kg — ABNORMAL LOW (ref 275–295)

## 2016-08-11 LAB — OSMOLALITY, URINE: Osmolality, Ur: 270 mOsm/kg — ABNORMAL LOW (ref 300–900)

## 2016-08-11 LAB — SODIUM, URINE, RANDOM: SODIUM UR: 43 mmol/L

## 2016-08-11 MED ORDER — MORPHINE SULFATE (PF) 4 MG/ML IV SOLN
2.0000 mg | INTRAVENOUS | Status: DC | PRN
  Administered 2016-08-11: 2 mg via INTRAVENOUS
  Filled 2016-08-11: qty 1

## 2016-08-11 MED ORDER — DOCUSATE SODIUM 100 MG PO CAPS
100.0000 mg | ORAL_CAPSULE | Freq: Two times a day (BID) | ORAL | Status: DC
Start: 2016-08-11 — End: 2016-08-12
  Administered 2016-08-11 – 2016-08-12 (×3): 100 mg via ORAL
  Filled 2016-08-11 (×3): qty 1

## 2016-08-11 MED ORDER — ENOXAPARIN SODIUM 40 MG/0.4ML ~~LOC~~ SOLN
40.0000 mg | SUBCUTANEOUS | Status: DC
  Administered 2016-08-11 – 2016-08-12 (×2): 40 mg via SUBCUTANEOUS
  Filled 2016-08-11 (×3): qty 0.4

## 2016-08-11 MED ORDER — ACETAMINOPHEN 325 MG PO TABS: 650.0000 mg | ORAL_TABLET | Freq: Four times a day (QID) | ORAL | Status: DC | PRN

## 2016-08-11 MED ORDER — BOOST / RESOURCE BREEZE PO LIQD
1.0000 | Freq: Three times a day (TID) | ORAL | Status: DC
  Administered 2016-08-11 – 2016-08-13 (×8): 1 via ORAL
  Administered 2016-08-13: 22:00:00 via ORAL
  Administered 2016-08-14 – 2016-08-15 (×2): 1 via ORAL

## 2016-08-11 MED ORDER — OXYCODONE-ACETAMINOPHEN 5-325 MG PO TABS
1.0000 | ORAL_TABLET | ORAL | Status: DC | PRN
  Administered 2016-08-11 – 2016-08-15 (×11): 1 via ORAL
  Filled 2016-08-11 (×11): qty 1

## 2016-08-11 MED ORDER — ONDANSETRON HCL 4 MG PO TABS: 4.0000 mg | ORAL_TABLET | Freq: Four times a day (QID) | ORAL | Status: DC | PRN

## 2016-08-11 MED ORDER — SIMVASTATIN 10 MG PO TABS
10.0000 mg | ORAL_TABLET | Freq: Every day | ORAL | Status: DC
  Administered 2016-08-11 – 2016-08-14 (×4): 10 mg via ORAL
  Filled 2016-08-11 (×5): qty 1

## 2016-08-11 MED ORDER — ALBUTEROL SULFATE (2.5 MG/3ML) 0.083% IN NEBU: 2.5000 mg | INHALATION_SOLUTION | RESPIRATORY_TRACT | Status: DC | PRN

## 2016-08-11 MED ORDER — ONDANSETRON HCL 4 MG/2ML IJ SOLN: 4.0000 mg | Freq: Four times a day (QID) | INTRAMUSCULAR | Status: DC | PRN

## 2016-08-11 MED ORDER — ACETAMINOPHEN 650 MG RE SUPP: 650.0000 mg | Freq: Four times a day (QID) | RECTAL | Status: DC | PRN

## 2016-08-11 MED ORDER — SUCRALFATE 1 G PO TABS
1.0000 g | ORAL_TABLET | Freq: Four times a day (QID) | ORAL | Status: DC
  Administered 2016-08-11 – 2016-08-15 (×16): 1 g via ORAL
  Filled 2016-08-11 (×17): qty 1

## 2016-08-11 MED ORDER — ASPIRIN EC 81 MG PO TBEC
81.0000 mg | DELAYED_RELEASE_TABLET | Freq: Every day | ORAL | Status: DC
  Administered 2016-08-11 – 2016-08-15 (×4): 81 mg via ORAL
  Filled 2016-08-11 (×4): qty 1

## 2016-08-11 MED ORDER — PANTOPRAZOLE SODIUM 20 MG PO TBEC
20.0000 mg | DELAYED_RELEASE_TABLET | Freq: Every day | ORAL | Status: DC
  Administered 2016-08-11 – 2016-08-15 (×5): 20 mg via ORAL
  Filled 2016-08-11 (×5): qty 1

## 2016-08-11 MED ORDER — SODIUM CHLORIDE 0.9 % IV SOLN
INTRAVENOUS | Status: DC
  Administered 2016-08-11 – 2016-08-15 (×7): via INTRAVENOUS

## 2016-08-11 MED ORDER — MELOXICAM 7.5 MG PO TABS
7.5000 mg | ORAL_TABLET | Freq: Every day | ORAL | Status: DC
  Administered 2016-08-11 – 2016-08-15 (×5): 7.5 mg via ORAL
  Filled 2016-08-11 (×5): qty 1

## 2016-08-11 NOTE — H&P (Signed)
History and Physical    PHOENIX RIESEN QQV:[id number] DOB: Jul 11, 1940 DOA: 08/10/2016  Referring MD/NP/PA: Dr. Regenia Skeeter PCP: Julius Bowels, MD (Inactive)  Patient coming from: Home  Chief Complaint: Abdominal pain  HPI: Christina Peterson is a 76 y.o. female with medical history significant of HTN, HLD, and arthritis; who presents with complaints of abdominal pain. Symptoms have been progressively worsening over the last 1 month. Eating meals previously had worsened/onset symptoms. She states that previously symptoms were intermittent, but have become more constant. Because of symptoms patient has had decrease oral intake of food and at baseline does not drink a lot of water. Patient was seen in the emergency department 2 days ago and at that time underwent MRI which revealed periportal and central mesenteric adenopathy consistent with lymphoma as well as 4 cm right kidney mass concerning for cell carcinoma. She is being set up to have outpatient follow-up with oncology. She reports that they were going to call her in one day. However, reported worsening pain and presented back to the emergency department for further evaluation and treatment. Reports taking hydrocodone recently given without relief of symptoms. Associated symptoms included nausea, feeling some lightheadedness with changes in position, dyspnea on exertion, and constipation for the last 2 days despite taking Colace. Denies having any significant cough, fever, chills, vomiting, dysuria  ED Course: Upon admission into [facility] perm patient was seen have vital signs relatively within normal limits. Labs revealed sodium of 125 and LFTs/lipase noted to be within normal limits. Lab work was thereafter repeated after  with a peripheral stick, and the patient's sodium was  still noted to be 125. Patient generally weak and therefore TRH called to workup for possible symptomatic hyponatremia.   Review of Systems: As per HPI otherwise 10 point review  of systems negative.   Past Medical History:  Diagnosis Date  . Arthritis   . Hypercholesteremia   . Hypertension    FOLLOWED BY DR Katherine Roan    Past Surgical History:  Procedure Laterality Date  . ABDOMINAL HYSTERECTOMY    . JOINT REPLACEMENT    . 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 TOTAL HIP REVISION  05/02/2011   Procedure: TOTAL HIP REVISION;  Surgeon: Sharmon Revere, MD;  Location: [facility];  Service: Orthopedics;  Laterality: Left;  left total hip revision with bone grafting     reports that she has never smoked. She has never used smokeless tobacco. She reports that she does not drink alcohol or use drugs.  No Known Allergies  History reviewed. No pertinent family history.  Prior to Admission medications   Medication Sig Start Date End Date Taking? Authorizing Provider  aspirin EC 81 MG tablet Take 81 mg by mouth daily.   Yes Historical Provider, MD  benazepril-hydrochlorthiazide (LOTENSIN HCT) 20-12.5 MG per tablet Take 1 tablet by mouth daily. Tuesday, Thursday and saturday   Yes Historical Provider, MD  Calcium Citrate-Vitamin D (CITRACAL MAXIMUM PO) Take 2 tablets by mouth daily.    Yes Historical Provider, MD  docusate sodium (COLACE) 100 MG capsule Take 1 capsule (100 mg total) by mouth every 12 (twelve) hours. 08/09/16  Yes Lacretia Leigh, MD  HYDROcodone-acetaminophen (NORCO/VICODIN) 5-325 MG tablet Take 1-2 tablets by mouth every 4 (four) hours as needed. Patient taking differently: Take 1-2 tablets by mouth every 4 (four) hours as needed for moderate pain or severe pain.  08/09/16  Yes Lacretia Leigh, MD  meloxicam (MOBIC) 7.5 MG tablet Take 7.5 mg by  mouth daily.  03/04/16  Yes Historical Provider, MD  naproxen sodium (ANAPROX) 220 MG tablet Take 220 mg by mouth every 12 (twelve) hours as needed (pain).    Yes Historical Provider, MD  pantoprazole (PROTONIX) 20 MG tablet Take 1 tablet (20 mg total) by mouth daily. 07/19/16  Yes Lacretia Leigh, MD  simvastatin (ZOCOR)  10 MG tablet Take 10 mg by mouth at bedtime.     Yes Historical Provider, MD  sucralfate (CARAFATE) 1 g tablet Take 1 tablet (1 g total) by mouth 4 (four) times daily. 07/19/16  Yes Lacretia Leigh, MD  benzonatate (TESSALON) 100 MG capsule Take 1 capsule (100 mg total) by mouth every 8 (eight) hours. Patient not taking: Reported on 06/05/2016 10/01/15   Comer Locket, PA-C  HYDROcodone-acetaminophen (NORCO/VICODIN) 5-325 MG per tablet Take 1 tablet by mouth every 6 (six) hours as needed for severe pain. Patient not taking: Reported on 06/05/2016 12/09/14   Varney Biles, MD    Physical Exam:    Constitutional: Elderly female who appears to be in NAD, calm, comfortable Vitals:   08/10/16 2145 08/10/16 2146 08/10/16 2324 08/11/16 0015  BP: (!) 157/79  (!) 142/70 134/72  Pulse: 62  (!) 57 (!) 53  Resp: 16  20   Temp: 97.8 F (36.6 C)     TempSrc: Oral     SpO2: 100%  97% 98%  Weight:  52.2 kg (115 lb)    Height:  5\' 1"  (1.549 m)     Eyes: PERRL, lids and conjunctivae normal ENMT: Mucous membranes are dry. Posterior pharynx clear of any exudate or lesions.  Neck: normal, supple, no masses, no thyromegaly Respiratory: clear to auscultation bilaterally, no wheezing, no crackles. Normal respiratory effort. No accessory muscle use.  Cardiovascular: Regular rate and rhythm, +SEM. No rubs / gallops. No extremity edema. 2+ pedal pulses. No carotid bruits.  Abdomen: Tenderness to palpation epigastrically where masses can be palpated. No hepatosplenomegaly. Bowel sounds decreased.  Musculoskeletal: no clubbing / cyanosis. No joint deformity upper and lower extremities. Good ROM, no contractures. Normal muscle tone.  Skin: Poor skin turgor. No rashes, lesions, ulcers. No induration Neurologic: CN 2-12 grossly intact. Sensation intact, DTR normal. Strength 5/5 in all 4.  Psychiatric: Normal judgment and insight. Alert and oriented x 3. Normal mood.     Labs on Admission: I have personally  reviewed following labs and imaging studies  CBC:  Recent Labs Lab 08/09/16 0725 08/10/16 2232  WBC 4.7 8.3  NEUTROABS  --  7.0  HGB 11.3* 11.0*  HCT 34.6* 33.1*  MCV 88.3 87.8  PLT 240 440   Basic Metabolic Panel:  Recent Labs Lab 08/09/16 0725 08/10/16 2232 08/10/16 2347  NA 134* 125* 125*  K 3.5 3.4* 3.5  CL 96* 87* 88*  CO2 29 27 28   GLUCOSE 100* 108* 101*  BUN 10 11 11   CREATININE 0.72 0.65 0.68  CALCIUM 9.5 9.2 9.2   GFR: Estimated Creatinine Clearance: 44.4 mL/min (by C-G formula based on SCr of 0.68 mg/dL). Liver Function Tests:  Recent Labs Lab 08/09/16 0725 08/10/16 2232  AST 30 31  ALT 18 19  ALKPHOS 77 75  BILITOT 0.7 0.8  PROT 6.5 6.7  ALBUMIN 3.5 3.7    Recent Labs Lab 08/09/16 0725 08/10/16 2232  LIPASE 22 19   No results for input(s): AMMONIA in the last 168 hours. Coagulation Profile: No results for input(s): INR, PROTIME in the last 168 hours. Cardiac Enzymes:  Recent Labs Lab  08/09/16 0725  TROPONINI <0.03   BNP (last 3 results) No results for input(s): PROBNP in the last 8760 hours. HbA1C: No results for input(s): HGBA1C in the last 72 hours. CBG: No results for input(s): GLUCAP in the last 168 hours. Lipid Profile: No results for input(s): CHOL, HDL, LDLCALC, TRIG, CHOLHDL, LDLDIRECT in the last 72 hours. Thyroid Function Tests: No results for input(s): TSH, T4TOTAL, FREET4, T3FREE, THYROIDAB in the last 72 hours. Anemia Panel: No results for input(s): VITAMINB12, FOLATE, FERRITIN, TIBC, IRON, RETICCTPCT in the last 72 hours. Urine analysis:    Component Value Date/Time   COLORURINE YELLOW 04/30/2011 1145   APPEARANCEUR CLEAR 04/30/2011 1145   LABSPEC 1.007 04/30/2011 1145   PHURINE 6.0 04/30/2011 1145   GLUCOSEU NEGATIVE 04/30/2011 1145   HGBUR NEGATIVE 04/30/2011 1145   BILIRUBINUR LARGE (A) 04/30/2011 1145   KETONESUR NEGATIVE 04/30/2011 1145   PROTEINUR NEGATIVE 04/30/2011 1145   UROBILINOGEN 0.2  04/30/2011 1145   NITRITE NEGATIVE 04/30/2011 1145   LEUKOCYTESUR NEGATIVE 04/30/2011 1145   Sepsis Labs: No results found for this or any previous visit (from the past 240 hour(s)).   Radiological Exams on Admission: Mr Abdomen W Or Wo Contrast  Result Date: 08/09/2016 CLINICAL DATA:  Abdominal mass. Abdominal pain. Nausea and vomiting. EXAM: MRI ABDOMEN WITHOUT AND WITH CONTRAST TECHNIQUE: Multiplanar multisequence MR imaging of the abdomen was performed both before and after the administration of intravenous contrast. CONTRAST:  56mL MULTIHANCE GADOBENATE DIMEGLUMINE 529 MG/ML IV SOLN COMPARISON:  Ultrasound 08/09/2016 FINDINGS: Lower chest: Lung bases are clear. Hepatobiliary: No focal hepatic lesion. Large nodal mass in the porta hepatis measuring 6.0 x 4.2 cm is most consistent with large periportal lymph node. There is no biliary duct dilatation. Gallbladder normal. Common bile duct is difficult to follow. Portal veins are patent. Pancreas: Pancreas is difficult to evaluate. There is bulky adenopathy within the mesenteries adjacent the pancreas which makes defining the pancreatic tissue difficult. No discrete pancreatic lesion is identified. Spleen: Single 1.6 cm lesion within the spleen. Cannot exclude lymphoma lesion within the spleen. Spleen is normal volume.) Adrenals/urinary tract: Adrenal glands are normal. Along the lateral cortex of the inferior RIGHT kidney there is a 3.4 x 4.0 cm enhancing mass (image 55, series 1501. This is clearly depicted on T2 weighted image 34, series 3. Stomach/Bowel: Stomach, small-bowel in limited view the: Unremarkable. No bowel obstruction Vascular/Lymphatic: Abdominal aorta normal caliber. There is bulky adenopathy within the small bowel mesenteries centrally. Coronal image 12 series 10 and demonstrates no masses well. Mass lesions also demonstrated well on the eighth diffusion-weighted Ob 500 series. Example noted the LEFT upper abdomen measures 3.1 cm short  axis. Course periportal nodes again demonstrated. Retroperitoneal nodes LEFT of the aorta measure 1.6 cm short axis (image 51, series 4. Likely large. Caval lymph node measuring 3.0 cm (image 54, series 4 Other: No free fluid. Musculoskeletal: No aggressive osseous lesion. IMPRESSION: 1. 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 Retroperitoneal adenopathy extends along the aorta and IVC 2. Indeterminate lesion within normal volume spleen. 3. Recommend oncology consultation and FDG PET scan for further staging and biopsy planning. 4. Additional 4 cm lesion exophytic from the inferior pole the RIGHT kidney is concerning for RENAL CELL CARCINOMA. Electronically Signed   By: Suzy Bouchard M.D.   On: 08/09/2016 12:35   US Abdomen Complete  Result Date: 08/09/2016 CLINICAL DATA:  Abdominal pain for 3 months EXAM: ABDOMEN ULTRASOUND COMPLETE COMPARISON:  None. FINDINGS: Gallbladder: No  gallstones or wall thickening visualized. No sonographic Murphy sign noted by sonographer. Common bile duct: Diameter: Normal caliber, 2 mm Liver: There is a 5.9 x 4.8 x 4.6 cm mixed echotexture solid lesion in the left hepatic lobe. Overall, the lesion is hypoechoic. This is nonspecific and warrants additional characterization. IVC: No abnormality visualized. Pancreas: Enlarged hypoechoic mass in the pancreatic tail measures 8.3 x 7.8 x 5.9 cm. Hypoechoic mass in the region the pancreatic head measures 3.2 x 2.2 x 4.5 cm. Spleen: Normal size. Hypoechoic lesion centrally within the spleen measures 1.9 x 1.5 x 1.3 cm. Right Kidney: Length: 8.1 cm. No hydronephrosis. There is a solid mass off the lower pole of the right kidney measuring 3.4 x 3.3 x 3.2 cm. 1.6 cm exophytic cyst off the midpole. Left Kidney: Length: 9.8 cm. Echogenicity within normal limits. No mass or hydronephrosis visualized. Abdominal aorta: No aneurysm visualized. Other findings: Within the right lower quadrant, there is a  solid mass measuring 5.8 x 3.7 x 3.2 cm of unknown etiology or source. This is adjacent to the aorta. IMPRESSION: Numerous solid organ abnormalities. 5.9 cm hypoechoic solid mass in the left hepatic lobe. At least 2 large masses in the pancreas, both pancreatic head and pancreatic tail. Hypoechoic lesions centrally within the spleen. 3.4 cm solid mass off the lower pole of the right kidney. In addition, there is a large soft tissue mass in the right lower quadrant of the abdomen adjacent to the aorta measuring up to 5.8 cm. These areas are concerning for malignancy and warrant further evaluation. Recommend MRI of the abdomen without and with contrast for further evaluation. Electronically Signed   By: Rolm Baptise M.D.   On: 08/09/2016 09:24    EKG: Independently reviewed. Sinus bradycardia   Assessment/Plan Hyponatremia 2/2 Dehydration: Acute. Patient previous sodium was 134 and acutely dropped over 1-2 day period to 125 on admission. - Admit to MedSurg bed - Follow-up urine osmolarity, urine sodium, serum osmolarity - Asked ED physician to check orthostatic vitals as patient on diuretic.  - IVF NS at 164ml/hr - Recheck BMP in a.m.  Abdominal pain, abdominal masses and right kidney mass : Acutely found on MRI of the abdomen showing signs of possible lymphoma with right kidney mass suggestive of renal cell carcinoma. Patient had previously been set up with oncology appointment for Monday to schedule biopsy for possible treatment and staging. Patient reports last bowel movement being at least 2 days ago. - Clear liquid diet, and advance as tolerated - Oxycodone/morphine IV prn moderate to severe pain respectively - Follow-up urinalysis  - Consider need of consultative interventional radiology/ oncology in a.m. for possible biopsy/arrangement of follow-up in the inpatient setting.  Constipation: Acute. Patient reports last bowel movement was 2 days ago. Decrease oral intake of fluids with new  prescription for pain medications likely contributing to symptoms. - Continue Colace may warrent additional medication like senna to regimen - Check abdominal x-ray  Normocytic normochromic anemia: Stable. Hemoglobin 11 which appears similar to previous blood work. - Continue to monitor  Essential hypertension - Hold benazepril-hydrochlorothiazide 2/2 hyponatremia  Hyperlipidemia - Continue simvastatin  Gerd - Continue Protonix and Carafate  DVT prophylaxis: Lovenox  Code Status: Full Family Communication: Discussed plan of care with patient and family present at bedside.  Disposition Plan:Likely discharge home once medically stable  Consults called: None  Admission status: Observation  Norval Morton MD [facility] Pager [phone]  If 7PM-7AM, please contact night-coverage [email or web address] Password [email or web address]  08/11/2016, 1:20 AM

## 2016-08-11 NOTE — Progress Notes (Signed)
Pt seen and examined at bedside, admitted after midnight, please see earlier note by Dr. Tamala Julian. Pt admitted for evaluation of abd pain, MRI abdomen concerning for lymphoma and ? Renal cell carcinoma. Will consult IR for consideration of biopsy, also consult with oncology for further recommendations. Continue IVF as pt still somewhat dry on exam and with poor oral intake. Try to advance diet. CBC and BMP in AM.  Faye Ramsay, MD  [facility] Pager [phone]  If 7PM-7AM, please contact night-coverage [email or web address] Password [email or web address]

## 2016-08-12 ENCOUNTER — Encounter (HOSPITAL_COMMUNITY): Payer: Self-pay | Admitting: Radiology

## 2016-08-12 ENCOUNTER — Inpatient Hospital Stay (HOSPITAL_COMMUNITY): Payer: Medicare Other

## 2016-08-12 LAB — COMPREHENSIVE METABOLIC PANEL
ALBUMIN: 3.1 g/dL — AB (ref 3.5–5.0)
ALT: 15 U/L (ref 14–54)
AST: 28 U/L (ref 15–41)
Alkaline Phosphatase: 63 U/L (ref 38–126)
Anion gap: 7 (ref 5–15)
BUN: 7 mg/dL (ref 6–20)
CHLORIDE: 99 mmol/L — AB (ref 101–111)
CO2: 28 mmol/L (ref 22–32)
CREATININE: 0.57 mg/dL (ref 0.44–1.00)
Calcium: 8.6 mg/dL — ABNORMAL LOW (ref 8.9–10.3)
GFR calc Af Amer: >60 mL/min (ref >60)
GFR calc non Af Amer: >60 mL/min (ref >60)
GLUCOSE: 98 mg/dL (ref 65–99)
POTASSIUM: 3.7 mmol/L (ref 3.5–5.1)
SODIUM: 134 mmol/L — AB (ref 135–145)
Total Bilirubin: 0.5 mg/dL (ref 0.3–1.2)
Total Protein: 5.6 g/dL — ABNORMAL LOW (ref 6.5–8.1)

## 2016-08-12 LAB — CBC
HCT: 32.3 % — ABNORMAL LOW (ref 36.0–46.0)
HEMOGLOBIN: 10.6 g/dL — AB (ref 12.0–15.0)
MCH: 29 pg (ref 26.0–34.0)
MCHC: 32.8 g/dL (ref 30.0–36.0)
MCV: 88.3 fL (ref 78.0–100.0)
Platelets: 228 10*3/uL (ref 150–400)
RBC: 3.66 MIL/uL — ABNORMAL LOW (ref 3.87–5.11)
RDW: 13.6 % (ref 11.5–15.5)
WBC: 6.8 10*3/uL (ref 4.0–10.5)

## 2016-08-12 MED ORDER — SENNOSIDES-DOCUSATE SODIUM 8.6-50 MG PO TABS
1.0000 | ORAL_TABLET | Freq: Two times a day (BID) | ORAL | Status: DC
  Administered 2016-08-12 – 2016-08-14 (×4): 1 via ORAL
  Filled 2016-08-12 (×7): qty 1

## 2016-08-12 MED ORDER — IOPAMIDOL (ISOVUE-300) INJECTION 61%
INTRAVENOUS | Status: AC
  Administered 2016-08-12: 13:00:00
  Filled 2016-08-12: qty 100

## 2016-08-12 MED ORDER — IOPAMIDOL (ISOVUE-300) INJECTION 61%: 15.0000 mL | Freq: Two times a day (BID) | INTRAVENOUS | Status: DC | PRN

## 2016-08-12 MED ORDER — ENSURE ENLIVE PO LIQD
237.0000 mL | Freq: Two times a day (BID) | ORAL | Status: DC
Start: 2016-08-12 — End: 2016-08-15
  Administered 2016-08-12 – 2016-08-15 (×4): 237 mL via ORAL

## 2016-08-12 MED ORDER — IOPAMIDOL (ISOVUE-300) INJECTION 61%
100.0000 mL | Freq: Once | INTRAVENOUS | Status: AC | PRN
  Administered 2016-08-12: 80 mL via INTRAVENOUS

## 2016-08-12 MED ORDER — POLYETHYLENE GLYCOL 3350 17 G PO PACK
17.0000 g | PACK | Freq: Every day | ORAL | Status: DC
  Administered 2016-08-12 – 2016-08-14 (×3): 17 g via ORAL
  Filled 2016-08-12 (×4): qty 1

## 2016-08-12 MED ORDER — IOPAMIDOL (ISOVUE-300) INJECTION 61%
INTRAVENOUS | Status: AC
  Administered 2016-08-12: 15 mL
  Filled 2016-08-12: qty 30

## 2016-08-12 NOTE — Progress Notes (Addendum)
PROGRESS NOTE    Christina Peterson  FAO:[id number] DOB: 10/26/1940 DOA: 08/10/2016  PCP: Julius Bowels, MD (Inactive)   Brief Narrative:  Pt admitted for evaluation of abd pain, poor oral intake. MRI abdomen concerning for lymphoma and ? Renal cell carcinoma. IR consulted for assistance.   Assessment & Plan:   Principal Problem:   Hyponatremia - appears to be pre renal in etiology from poor oral intake and dehydration - improving with IVF - encouraged oral intake  - BMP in AM  Active Problems:   Diffuse adenopathy, renal cell mass - concerning for lymphoma and RCC - IR consulted for assistance with biopsy - D/w Dr. Lindi Adie, recommended palliative care consult    Dyspnea on exertion  - with diffuse adenopathy in the chest area - ? Lymphoma  - keep on oxygen as needed     Normocytic normochromic anemia - likely secondary to the above - no signs of active bleeding - CBC in AM    Essential hypertension - reasonable inpatient control for now    Constipation - place on bowel regimen     Hyperlipidemia - continue statin     Abdominal pain - secondary to ? Lymphoma - allow analgesia as needed     Severe PCM - nutritionist consulted   DVT prophylaxis: Lovenox SQ Code Status: Full  Family Communication: Patient at bedside  Disposition Plan: to be determined   Consultants:   IR  Oncology over the phone   Procedures:   None  Antimicrobials:   None  Subjective: No events overnight, reports feeling bit better.   Objective: Vitals:   08/11/16 1400 08/11/16 2248 08/12/16 0619 08/12/16 1353  BP: 131/82 (!) 124/57 130/70 (!) 145/75  Pulse: 60 60 62 (!) 56  Resp: 16 18 18 16   Temp: 97.3 F (36.3 C) 98.4 F (36.9 C) 98.3 F (36.8 C) 97.8 F (36.6 C)  TempSrc: Oral Oral Oral Oral  SpO2: 98% 97% 97% 98%  Weight:      Height:        Intake/Output Summary (Last 24 hours) at 08/12/16 1932 Last data filed at 08/12/16 1830  Gross per 24 hour  Intake               240 ml  Output             1550 ml  Net            -1310 ml   Filed Weights   08/10/16 2146 08/11/16 0311  Weight: 52.2 kg (115 lb) 49 kg (108 lb)    Examination:  General exam: Appears calm and comfortable  Respiratory system: Respiratory effort normal. Cardiovascular system: S1 & S2 heard, RRR. No JVD, murmurs, rubs, gallops or clicks. No pedal edema. Gastrointestinal system: Abdomen is nondistended, somewhat tender in epigastric area.  Central nervous system: Alert and oriented. No focal neurological deficits. Extremities: Symmetric 5 x 5 power. Skin: No rashes, lesions or ulcers Psychiatry: Judgement and insight appear normal. Mood & affect appropriate.    Data Reviewed: I have personally reviewed following labs and imaging studies  CBC:  Recent Labs Lab 08/09/16 0725 08/10/16 2232 08/12/16 0358  WBC 4.7 8.3 6.8  NEUTROABS  --  7.0  --   HGB 11.3* 11.0* 10.6*  HCT 34.6* 33.1* 32.3*  MCV 88.3 87.8 88.3  PLT 240 246 696   Basic Metabolic Panel:  Recent Labs Lab 08/09/16 0725 08/10/16 2232 08/10/16 2347 08/11/16 0822 08/12/16 0358  NA 134* 125*  125* 130* 134*  K 3.5 3.4* 3.5 3.6 3.7  CL 96* 87* 88* 92* 99*  CO2 29 27 28  32 28  GLUCOSE 100* 108* 101* 88 98  BUN 10 11 11 8 7   CREATININE 0.72 0.65 0.68 0.69 0.57  CALCIUM 9.5 9.2 9.2 8.8* 8.6*   Liver Function Tests:  Recent Labs Lab 08/09/16 0725 08/10/16 2232 08/12/16 0358  AST 30 31 28   ALT 18 19 15   ALKPHOS 77 75 63  BILITOT 0.7 0.8 0.5  PROT 6.5 6.7 5.6*  ALBUMIN 3.5 3.7 3.1*    Recent Labs Lab 08/09/16 0725 08/10/16 2232  LIPASE 22 19   Cardiac Enzymes:  Recent Labs Lab 08/09/16 0725  TROPONINI <0.03   Urine analysis:    Component Value Date/Time   COLORURINE STRAW (A) 08/11/2016 [place] 08/11/2016 0137   LABSPEC 1.006 08/11/2016 0137   PHURINE 5.0 08/11/2016 0137   GLUCOSEU NEGATIVE 08/11/2016 0137   HGBUR MODERATE (A) 08/11/2016 0137    BILIRUBINUR NEGATIVE 08/11/2016 0137   KETONESUR 5 (A) 08/11/2016 0137   PROTEINUR NEGATIVE 08/11/2016 0137   UROBILINOGEN 0.2 04/30/2011 1145   NITRITE NEGATIVE 08/11/2016 0137   LEUKOCYTESUR NEGATIVE 08/11/2016 1517   Radiology Studies: Dg Abd 1 View  Result Date: 08/11/2016 CLINICAL DATA:  Nausea, constipation EXAM: ABDOMEN - 1 VIEW COMPARISON:  None. FINDINGS: The bowel gas pattern is normal. Phleboliths are noted within the pelvis. There is lower lumbar degenerative disc and facet arthropathy from approximately L4 caudad with osteoarthritis of the sacroiliac joints bilaterally and pubic symphysis. The partially visualized left total hip arthroplasty is noted with the acetabular component placed slightly more cephalad than the normal expected location of the left acetabulum. 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 The right hip demonstrates native joint space narrowing. IMPRESSION: No significant stool burden. No bowel obstruction. 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 Electronically Signed   By: Ashley Royalty M.D.   On: 08/11/2016 03:15   Ct Chest W Contrast  Result Date: 08/12/2016 CLINICAL DATA:  Mid abdominal pain for 1 month. Worse with eating. MRI demonstrating adenopathy. EXAM: CT CHEST, ABDOMEN, AND PELVIS WITH CONTRAST TECHNIQUE: Multidetector CT imaging of the chest, abdomen and pelvis was performed following the standard protocol during bolus administration of intravenous contrast. CONTRAST:  22mL ISOVUE-300 IOPAMIDOL (ISOVUE-300) INJECTION 61% COMPARISON:  08/09/2016 abdominal MRI. FINDINGS: CT CHEST FINDINGS Cardiovascular: Tortuous thoracic aorta. Moderate cardiomegaly, without pericardial effusion. No central pulmonary embolism, on this non-dedicated study. Mediastinum/Nodes: No supraclavicular  adenopathy. Subcentimeter right thyroid nodule is nonspecific. No middle mediastinal or hilar adenopathy in. Necrotic adenopathy in the prevascular space measures 2.2 by 2.0 cm on image 20/series 2. Lungs/Pleura: Small bilateral pleural effusions. Mild bibasilar atelectasis. Musculoskeletal: No acute osseous abnormality. Bilateral glenohumeral joint osteoarthritis. CT ABDOMEN PELVIS FINDINGS Hepatobiliary: Normal liver. Normal gallbladder, without biliary ductal dilatation. Pancreas: Pancreatic atrophy. Pancreatic head displaced anteriorly by portacaval adenopathy. No pancreatic duct dilatation. Spleen: Hypoattenuating splenic lesions, larger of which measures 2.1 cm. No splenomegaly. Adrenals/Urinary Tract: Normal adrenal glands. Heterogeneous lower pole right renal exophytic 3.6 cm mass. Normal left kidney. No hydronephrosis. Degraded evaluation of the pelvis, secondary to beam hardening artifact from left hip arthroplasty. Grossly normal urinary bladder. Stomach/Bowel: Gastric antrum displaced anteriorly by abdominal adenopathy. No obstruction. Normal colon and terminal ileum. Normal small bowel caliber. Vascular/Lymphatic:  Aortic and branch vessel atherosclerosis. Extensive abdominal adenopathy. Nodal mass in the portal caval space measures 4.4 x 5.6 cm on image 57/series 2. Retroperitoneal adenopathy, with a necrotic retrocaval node measuring 3.3 x 4.2 cm on image 66/series 2. Extensive small bowel mesenteric adenopathy. Pelvic sidewalls not well evaluated. Reproductive: Hysterectomy.  No gross adnexal mass. Other: No significant free fluid. Musculoskeletal: Left hip arthroplasty.  Right hip osteoarthritis. IMPRESSION: 1. Adenopathy within the chest and abdomen, most consistent with lymphoma. 2. Degraded evaluation of the pelvis, secondary to beam hardening artifact from left hip arthroplasty 3. Right renal mass, suspicious for renal cell carcinoma. Please see recent abdominal MRI for further description. 4.  Small bilateral pleural effusions. 5.  Aortic atherosclerosis. Electronically Signed   By: Abigail Miyamoto M.D.   On: 08/12/2016 13:30   Ct Abdomen Pelvis W Contrast  Result Date: 08/12/2016 CLINICAL DATA:  Mid abdominal pain for 1 month. Worse with eating. MRI demonstrating adenopathy. EXAM: CT CHEST, ABDOMEN, AND PELVIS WITH CONTRAST TECHNIQUE: Multidetector CT imaging of the chest, abdomen and pelvis was performed following the standard protocol during bolus administration of intravenous contrast. CONTRAST:  38mL ISOVUE-300 IOPAMIDOL (ISOVUE-300) INJECTION 61% COMPARISON:  08/09/2016 abdominal MRI. FINDINGS: CT CHEST FINDINGS Cardiovascular: Tortuous thoracic aorta. Moderate cardiomegaly, without pericardial effusion. No central pulmonary embolism, on this non-dedicated study. Mediastinum/Nodes: No supraclavicular adenopathy. Subcentimeter right thyroid nodule is nonspecific. No middle mediastinal or hilar adenopathy in. Necrotic adenopathy in the prevascular space measures 2.2 by 2.0 cm on image 20/series 2. Lungs/Pleura: Small bilateral pleural effusions. Mild bibasilar atelectasis. Musculoskeletal: No acute osseous abnormality. Bilateral glenohumeral joint osteoarthritis. CT ABDOMEN PELVIS FINDINGS Hepatobiliary: Normal liver. Normal gallbladder, without biliary ductal dilatation. Pancreas: Pancreatic atrophy. Pancreatic head displaced anteriorly by portacaval adenopathy. No pancreatic duct dilatation. Spleen: Hypoattenuating splenic lesions, larger of which measures 2.1 cm. No splenomegaly. Adrenals/Urinary Tract: Normal adrenal glands. Heterogeneous lower pole right renal exophytic 3.6 cm mass. Normal left kidney. No hydronephrosis. Degraded evaluation of the pelvis, secondary to beam hardening artifact from left hip arthroplasty. Grossly normal urinary bladder. Stomach/Bowel: Gastric antrum displaced anteriorly by abdominal adenopathy. No obstruction. Normal colon and terminal ileum. Normal small bowel  caliber. Vascular/Lymphatic: Aortic and branch vessel atherosclerosis. Extensive abdominal adenopathy. Nodal mass in the portal caval space measures 4.4 x 5.6 cm on image 57/series 2. Retroperitoneal adenopathy, with a necrotic retrocaval node measuring 3.3 x 4.2 cm on image 66/series 2. Extensive small bowel mesenteric adenopathy. Pelvic sidewalls not well evaluated. Reproductive: Hysterectomy.  No gross adnexal mass. Other: No significant free fluid. Musculoskeletal: Left hip arthroplasty.  Right hip osteoarthritis. IMPRESSION: 1. Adenopathy within the chest and abdomen, most consistent with lymphoma. 2. Degraded evaluation of the pelvis, secondary to beam hardening artifact from left hip arthroplasty 3. Right renal mass, suspicious for renal cell carcinoma. Please see recent abdominal MRI for further description. 4. Small bilateral pleural effusions. 5.  Aortic atherosclerosis. Electronically Signed   By: Abigail Miyamoto M.D.   On: 08/12/2016 13:30      Scheduled Meds: . aspirin EC  81 mg Oral Daily  . docusate sodium  100 mg Oral Q12H  . enoxaparin (LOVENOX) injection  40 mg Subcutaneous Q24H  . feeding supplement  1 Container Oral TID BM  . feeding supplement (ENSURE ENLIVE)  237 mL Oral BID BM  . meloxicam  7.5 mg Oral Daily  . pantoprazole  20 mg Oral Daily  . simvastatin  10 mg Oral QHS  . sucralfate  1 g Oral QID  Continuous Infusions: . sodium chloride 75 mL/hr at 08/12/16 1553     LOS: 1 day   Time spent: 20 minutes   Faye Ramsay, MD [facility] Pager [phone]  If 7PM-7AM, please contact night-coverage [email or web address] Password [facility] 08/12/2016, 7:32 PM

## 2016-08-12 NOTE — Consult Note (Signed)
Chief Complaint: Patient was seen in consultation today for CT-guided mesenteric nodal mass biopsy Chief Complaint  Patient presents with  . Abdominal Pain  . Back Pain     Referring Physician(s): Myers,I  Supervising Physician: Sandi Mariscal  Patient Status: [facility] - In-pt  History of Present Illness: Christina Peterson is a 76 y.o. female with past medical history of hypertension and hyperlipidemia who was recently admitted to the hospital on 4/28 with persistent abdominal pain, nausea, constipation as well as some mild dyspnea with exertion. Subsequent imaging revealed:  1. Adenopathy within the chest and abdomen, most consistent with lymphoma. 2. Degraded evaluation of the pelvis, secondary to beam hardening artifact from left hip arthroplasty 3. Right renal mass, suspicious for renal cell carcinoma. Please see recent abdominal MRI for further description. 4. Small bilateral pleural effusions. 5.  Aortic atherosclerosis  Request now received for CT guided mesenteric nodal mass biopsy for further evaluation.  Past Medical History:  Diagnosis Date  . Arthritis   . Hypercholesteremia   . Hypertension    FOLLOWED BY DR Katherine Roan    Past Surgical History:  Procedure Laterality Date  . ABDOMINAL HYSTERECTOMY    . JOINT REPLACEMENT    . 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 TOTAL HIP REVISION  05/02/2011   Procedure: TOTAL HIP REVISION;  Surgeon: Sharmon Revere, MD;  Location: [place];  Service: Orthopedics;  Laterality: Left;  left total hip revision with bone grafting    Allergies: Patient has no known allergies.  Medications: Prior to Admission medications   Medication Sig Start Date End Date Taking? Authorizing Provider  aspirin EC 81 MG tablet Take 81 mg by mouth daily.   Yes Historical Provider, MD  benazepril-hydrochlorthiazide (LOTENSIN HCT) 20-12.5 MG per tablet Take 1 tablet by mouth daily. Tuesday, Thursday and saturday   Yes Historical Provider, MD    Calcium Citrate-Vitamin D (CITRACAL MAXIMUM PO) Take 2 tablets by mouth daily.    Yes Historical Provider, MD  docusate sodium (COLACE) 100 MG capsule Take 1 capsule (100 mg total) by mouth every 12 (twelve) hours. 08/09/16  Yes Lacretia Leigh, MD  HYDROcodone-acetaminophen (NORCO/VICODIN) 5-325 MG tablet Take 1-2 tablets by mouth every 4 (four) hours as needed. Patient taking differently: Take 1-2 tablets by mouth every 4 (four) hours as needed for moderate pain or severe pain.  08/09/16  Yes Lacretia Leigh, MD  meloxicam (MOBIC) 7.5 MG tablet Take 7.5 mg by mouth daily.  03/04/16  Yes Historical Provider, MD  naproxen sodium (ANAPROX) 220 MG tablet Take 220 mg by mouth every 12 (twelve) hours as needed (pain).    Yes Historical Provider, MD  pantoprazole (PROTONIX) 20 MG tablet Take 1 tablet (20 mg total) by mouth daily. 07/19/16  Yes Lacretia Leigh, MD  simvastatin (ZOCOR) 10 MG tablet Take 10 mg by mouth at bedtime.     Yes Historical Provider, MD  sucralfate (CARAFATE) 1 g tablet Take 1 tablet (1 g total) by mouth 4 (four) times daily. 07/19/16  Yes Lacretia Leigh, MD  benzonatate (TESSALON) 100 MG capsule Take 1 capsule (100 mg total) by mouth every 8 (eight) hours. Patient not taking: Reported on 06/05/2016 10/01/15   Comer Locket, PA-C  HYDROcodone-acetaminophen (NORCO/VICODIN) 5-325 MG per tablet Take 1 tablet by mouth every 6 (six) hours as needed for severe pain. Patient not taking: Reported on 06/05/2016 12/09/14   Varney Biles, MD     History reviewed. No pertinent family history.  Social History   Social History  . Marital status: Legally Separated    Spouse name: N/A  . Number of children: N/A  . Years of education: N/A   Social History Main Topics  . Smoking status: Never Smoker  . Smokeless tobacco: Never Used  . Alcohol use No  . Drug use: No  . Sexual activity: Not Asked   Other Topics Concern  . None   Social History Narrative  . None      Review of Systems see  above; currently denies fever, headache, chest pain, cough, nausea, vomiting or abnormal bleeding  Vital Signs: BP (!) 145/75 (BP Location: Right Arm)   Pulse (!) 56   Temp 97.8 F (36.6 C) (Oral)   Resp 16   Ht 5\' 1"  (1.549 m)   Wt 108 lb (49 kg)   SpO2 98%   BMI 20.41 kg/m   Physical Exam awake, alert. Chest with slightly diminished breath sounds bases, heart with regular rate and rhythm,positive murmur;  abdomen soft, positive bowel sounds, mild epigastric tenderness to palpation, no lower extremity edema  Mallampati Score:     Imaging: Dg Abd 1 View  Result Date: 08/11/2016 CLINICAL DATA:  Nausea, constipation EXAM: ABDOMEN - 1 VIEW COMPARISON:  None. FINDINGS: The bowel gas pattern is normal. Phleboliths are noted within the pelvis. There is lower lumbar degenerative disc and facet arthropathy from approximately L4 caudad with osteoarthritis of the sacroiliac joints bilaterally and pubic symphysis. The partially visualized left total hip arthroplasty is noted with the acetabular component placed slightly more cephalad than the normal expected location of the left acetabulum. 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 The right hip demonstrates native joint space narrowing. IMPRESSION: No significant stool burden. No bowel obstruction. 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 Electronically Signed   By: Ashley Royalty M.D.   On: 08/11/2016 03:15   Ct Chest W Contrast  Result Date: 08/12/2016 CLINICAL DATA:  Mid abdominal pain for 1 month. Worse with eating. MRI demonstrating adenopathy. EXAM: CT CHEST, ABDOMEN, AND PELVIS WITH CONTRAST TECHNIQUE: Multidetector CT imaging of the chest, abdomen and pelvis was performed following the standard protocol during bolus administration of intravenous contrast. CONTRAST:   81mL ISOVUE-300 IOPAMIDOL (ISOVUE-300) INJECTION 61% COMPARISON:  08/09/2016 abdominal MRI. FINDINGS: CT CHEST FINDINGS Cardiovascular: Tortuous thoracic aorta. Moderate cardiomegaly, without pericardial effusion. No central pulmonary embolism, on this non-dedicated study. Mediastinum/Nodes: No supraclavicular adenopathy. Subcentimeter right thyroid nodule is nonspecific. No middle mediastinal or hilar adenopathy in. Necrotic adenopathy in the prevascular space measures 2.2 by 2.0 cm on image 20/series 2. Lungs/Pleura: Small bilateral pleural effusions. Mild bibasilar atelectasis. Musculoskeletal: No acute osseous abnormality. Bilateral glenohumeral joint osteoarthritis. CT ABDOMEN PELVIS FINDINGS Hepatobiliary: Normal liver. Normal gallbladder, without biliary ductal dilatation. Pancreas: Pancreatic atrophy. Pancreatic head displaced anteriorly by portacaval adenopathy. No pancreatic duct dilatation. Spleen: Hypoattenuating splenic lesions, larger of which measures 2.1 cm. No splenomegaly. Adrenals/Urinary Tract: Normal adrenal glands. Heterogeneous lower pole right renal exophytic 3.6 cm mass. Normal left kidney. No hydronephrosis. Degraded evaluation of the pelvis, secondary to beam hardening artifact from left hip arthroplasty. Grossly normal urinary bladder. Stomach/Bowel: Gastric antrum displaced anteriorly by abdominal adenopathy. No obstruction. Normal colon and terminal ileum. Normal small bowel caliber. Vascular/Lymphatic: Aortic and branch vessel atherosclerosis. Extensive abdominal adenopathy. Nodal mass in the portal caval space measures 4.4 x  5.6 cm on image 57/series 2. Retroperitoneal adenopathy, with a necrotic retrocaval node measuring 3.3 x 4.2 cm on image 66/series 2. Extensive small bowel mesenteric adenopathy. Pelvic sidewalls not well evaluated. Reproductive: Hysterectomy.  No gross adnexal mass. Other: No significant free fluid. Musculoskeletal: Left hip arthroplasty.  Right hip  osteoarthritis. IMPRESSION: 1. Adenopathy within the chest and abdomen, most consistent with lymphoma. 2. Degraded evaluation of the pelvis, secondary to beam hardening artifact from left hip arthroplasty 3. Right renal mass, suspicious for renal cell carcinoma. Please see recent abdominal MRI for further description. 4. Small bilateral pleural effusions. 5.  Aortic atherosclerosis. Electronically Signed   By: Abigail Miyamoto M.D.   On: 08/12/2016 13:30   Mr Abdomen W Or Wo Contrast  Result Date: 08/09/2016 CLINICAL DATA:  Abdominal mass. Abdominal pain. Nausea and vomiting. EXAM: MRI ABDOMEN WITHOUT AND WITH CONTRAST TECHNIQUE: Multiplanar multisequence MR imaging of the abdomen was performed both before and after the administration of intravenous contrast. CONTRAST:  26mL MULTIHANCE GADOBENATE DIMEGLUMINE 529 MG/ML IV SOLN COMPARISON:  Ultrasound 08/09/2016 FINDINGS: Lower chest: Lung bases are clear. Hepatobiliary: No focal hepatic lesion. Large nodal mass in the porta hepatis measuring 6.0 x 4.2 cm is most consistent with large periportal lymph node. There is no biliary duct dilatation. Gallbladder normal. Common bile duct is difficult to follow. Portal veins are patent. Pancreas: Pancreas is difficult to evaluate. There is bulky adenopathy within the mesenteries adjacent the pancreas which makes defining the pancreatic tissue difficult. No discrete pancreatic lesion is identified. Spleen: Single 1.6 cm lesion within the spleen. Cannot exclude lymphoma lesion within the spleen. Spleen is normal volume.) Adrenals/urinary tract: Adrenal glands are normal. Along the lateral cortex of the inferior RIGHT kidney there is a 3.4 x 4.0 cm enhancing mass (image 55, series 1501. This is clearly depicted on T2 weighted image 34, series 3. Stomach/Bowel: Stomach, small-bowel in limited view the: Unremarkable. No bowel obstruction Vascular/Lymphatic: Abdominal aorta normal caliber. There is bulky adenopathy within the small  bowel mesenteries centrally. Coronal image 12 series 10 and demonstrates no masses well. Mass lesions also demonstrated well on the eighth diffusion-weighted Ob 500 series. Example noted the LEFT upper abdomen measures 3.1 cm short axis. Course periportal nodes again demonstrated. Retroperitoneal nodes LEFT of the aorta measure 1.6 cm short axis (image 51, series 4. Likely large. Caval lymph node measuring 3.0 cm (image 54, series 4 Other: No free fluid. Musculoskeletal: No aggressive osseous lesion. IMPRESSION: 1. 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 Retroperitoneal adenopathy extends along the aorta and IVC 2. Indeterminate lesion within normal volume spleen. 3. Recommend oncology consultation and FDG PET scan for further staging and biopsy planning. 4. Additional 4 cm lesion exophytic from the inferior pole the RIGHT kidney is concerning for RENAL CELL CARCINOMA. Electronically Signed   By: Suzy Bouchard M.D.   On: 08/09/2016 12:35   US Abdomen Complete  Result Date: 08/09/2016 CLINICAL DATA:  Abdominal pain for 3 months EXAM: ABDOMEN ULTRASOUND COMPLETE COMPARISON:  None. FINDINGS: Gallbladder: No gallstones or wall thickening visualized. No sonographic Murphy sign noted by sonographer. Common bile duct: Diameter: Normal caliber, 2 mm Liver: There is a 5.9 x 4.8 x 4.6 cm mixed echotexture solid lesion in the left hepatic lobe. Overall, the lesion is hypoechoic. This is nonspecific and warrants additional characterization. IVC: No abnormality visualized. Pancreas: Enlarged hypoechoic mass in the pancreatic tail measures 8.3 x 7.8 x 5.9 cm. Hypoechoic mass in the region the pancreatic head  measures 3.2 x 2.2 x 4.5 cm. Spleen: Normal size. Hypoechoic lesion centrally within the spleen measures 1.9 x 1.5 x 1.3 cm. Right Kidney: Length: 8.1 cm. No hydronephrosis. There is a solid mass off the lower pole of the right kidney measuring 3.4 x 3.3 x 3.2 cm. 1.6 cm  exophytic cyst off the midpole. Left Kidney: Length: 9.8 cm. Echogenicity within normal limits. No mass or hydronephrosis visualized. Abdominal aorta: No aneurysm visualized. Other findings: Within the right lower quadrant, there is a solid mass measuring 5.8 x 3.7 x 3.2 cm of unknown etiology or source. This is adjacent to the aorta. IMPRESSION: Numerous solid organ abnormalities. 5.9 cm hypoechoic solid mass in the left hepatic lobe. At least 2 large masses in the pancreas, both pancreatic head and pancreatic tail. Hypoechoic lesions centrally within the spleen. 3.4 cm solid mass off the lower pole of the right kidney. In addition, there is a large soft tissue mass in the right lower quadrant of the abdomen adjacent to the aorta measuring up to 5.8 cm. These areas are concerning for malignancy and warrant further evaluation. Recommend MRI of the abdomen without and with contrast for further evaluation. Electronically Signed   By: Rolm Baptise M.D.   On: 08/09/2016 09:24   Ct Abdomen Pelvis W Contrast  Result Date: 08/12/2016 CLINICAL DATA:  Mid abdominal pain for 1 month. Worse with eating. MRI demonstrating adenopathy. EXAM: CT CHEST, ABDOMEN, AND PELVIS WITH CONTRAST TECHNIQUE: Multidetector CT imaging of the chest, abdomen and pelvis was performed following the standard protocol during bolus administration of intravenous contrast. CONTRAST:  45mL ISOVUE-300 IOPAMIDOL (ISOVUE-300) INJECTION 61% COMPARISON:  08/09/2016 abdominal MRI. FINDINGS: CT CHEST FINDINGS Cardiovascular: Tortuous thoracic aorta. Moderate cardiomegaly, without pericardial effusion. No central pulmonary embolism, on this non-dedicated study. Mediastinum/Nodes: No supraclavicular adenopathy. Subcentimeter right thyroid nodule is nonspecific. No middle mediastinal or hilar adenopathy in. Necrotic adenopathy in the prevascular space measures 2.2 by 2.0 cm on image 20/series 2. Lungs/Pleura: Small bilateral pleural effusions. Mild bibasilar  atelectasis. Musculoskeletal: No acute osseous abnormality. Bilateral glenohumeral joint osteoarthritis. CT ABDOMEN PELVIS FINDINGS Hepatobiliary: Normal liver. Normal gallbladder, without biliary ductal dilatation. Pancreas: Pancreatic atrophy. Pancreatic head displaced anteriorly by portacaval adenopathy. No pancreatic duct dilatation. Spleen: Hypoattenuating splenic lesions, larger of which measures 2.1 cm. No splenomegaly. Adrenals/Urinary Tract: Normal adrenal glands. Heterogeneous lower pole right renal exophytic 3.6 cm mass. Normal left kidney. No hydronephrosis. Degraded evaluation of the pelvis, secondary to beam hardening artifact from left hip arthroplasty. Grossly normal urinary bladder. Stomach/Bowel: Gastric antrum displaced anteriorly by abdominal adenopathy. No obstruction. Normal colon and terminal ileum. Normal small bowel caliber. Vascular/Lymphatic: Aortic and branch vessel atherosclerosis. Extensive abdominal adenopathy. Nodal mass in the portal caval space measures 4.4 x 5.6 cm on image 57/series 2. Retroperitoneal adenopathy, with a necrotic retrocaval node measuring 3.3 x 4.2 cm on image 66/series 2. Extensive small bowel mesenteric adenopathy. Pelvic sidewalls not well evaluated. Reproductive: Hysterectomy.  No gross adnexal mass. Other: No significant free fluid. Musculoskeletal: Left hip arthroplasty.  Right hip osteoarthritis. IMPRESSION: 1. Adenopathy within the chest and abdomen, most consistent with lymphoma. 2. Degraded evaluation of the pelvis, secondary to beam hardening artifact from left hip arthroplasty 3. Right renal mass, suspicious for renal cell carcinoma. Please see recent abdominal MRI for further description. 4. Small bilateral pleural effusions. 5.  Aortic atherosclerosis. Electronically Signed   By: Abigail Miyamoto M.D.   On: 08/12/2016 13:30    Labs:  CBC:  Recent Labs  07/19/16  2202 08/09/16 0725 08/10/16 2232 08/12/16 0358  WBC 5.8 4.7 8.3 6.8  HGB 11.6*  11.3* 11.0* 10.6*  HCT 36.0 34.6* 33.1* 32.3*  PLT 184 240 246 228    COAGS: No results for input(s): INR, APTT in the last 8760 hours.  BMP:  Recent Labs  08/10/16 2232 08/10/16 2347 08/11/16 0822 08/12/16 0358  NA 125* 125* 130* 134*  K 3.4* 3.5 3.6 3.7  CL 87* 88* 92* 99*  CO2 27 28 32 28  GLUCOSE 108* 101* 88 98  BUN 11 11 8 7   CALCIUM 9.2 9.2 8.8* 8.6*  CREATININE 0.65 0.68 0.69 0.57  GFRNONAA >60 >60 >60 >60  GFRAA >60 >60 >60 >60    LIVER FUNCTION TESTS:  Recent Labs  07/19/16 2202 08/09/16 0725 08/10/16 2232 08/12/16 0358  BILITOT 0.7 0.7 0.8 0.5  AST 27 30 31 28   ALT 18 18 19 15   ALKPHOS 84 77 75 63  PROT 7.0 6.5 6.7 5.6*  ALBUMIN 3.9 3.5 3.7 3.1*    TUMOR MARKERS: No results for input(s): AFPTM, CEA, CA199, CHROMGRNA in the last 8760 hours.  Assessment and Plan: 76 y.o. female with past medical history of hypertension and hyperlipidemia who was recently admitted to the hospital on 4/28 with persistent abdominal pain, nausea, constipation as well as some mild dyspnea with exertion. Subsequent imaging revealed:  1. Adenopathy within the chest and abdomen, most consistent with lymphoma. 2. Degraded evaluation of the pelvis, secondary to beam hardening artifact from left hip arthroplasty 3. Right renal mass, suspicious for renal cell carcinoma. Please see recent abdominal MRI for further description. 4. Small bilateral pleural effusions. 5.  Aortic atherosclerosis  Request now received for CT guided mesenteric nodal mass biopsy for further evaluation. Imaging studies have been reviewed by Dr. Kathlene Cote.Risks and benefits discussed with the patient/family including, but not limited to bleeding, infection, damage to adjacent structures or low yield requiring additional tests.All of the patient's questions were answered, patient is agreeable to proceed.Consent signed and in chart. Procedure is tentatively scheduled for 5/2.       Thank you for this  interesting consult.  I greatly enjoyed meeting KOI YARBRO and look forward to participating in their care.  A copy of this report was sent to the requesting provider on this date.  Electronically Signed: D. Rowe Robert 08/12/2016, 5:04 PM   I spent a total of 25 minutes  in face to face in clinical consultation, greater than 50% of which was counseling/coordinating care for CT-guided mesenteric nodal mass biopsy

## 2016-08-12 NOTE — Progress Notes (Signed)
Patient ID: Christina Peterson, female   DOB: [birth year], 76 y.o.   MRN: [id number] Request received for image guided biopsy on patient. Latest imaging has been reviewed by Dr. Kathlene Cote and he recommends further studies with CT chest/ abdomen/ pelvis before determining site for biopsy. Will follow.

## 2016-08-12 NOTE — Progress Notes (Signed)
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 Will follow for potential CSW needs.  Sharren Bridge, MSW, LCSW Clinical Social Work 08/12/2016 [phone]

## 2016-08-12 NOTE — Progress Notes (Signed)
Initial Nutrition Assessment  DOCUMENTATION CODES:   Severe malnutrition in context of acute illness/injury  INTERVENTION:   -Continue Boost Breeze po TID, each supplement provides 250 kcal and 9 grams of protein -Provide Ensure Enlive po BID, each supplement provides 350 kcal and 20 grams of protein -Encourage PO intake RD to continue to monitor  NUTRITION DIAGNOSIS:   Malnutrition (Severe) related to acute illness (new diagnosis of lymphoma) as evidenced by percent weight loss, energy intake < or equal to 50% for > or equal to 5 days, moderate depletion of body fat, mild depletion of muscle mass.  GOAL:   Patient will meet greater than or equal to 90% of their needs  MONITOR:   PO intake, Supplement acceptance, Labs, Weight trends, I & O's  REASON FOR ASSESSMENT:   Consult Assessment of nutrition requirement/status  ASSESSMENT:   76 y.o. female with medical history significant of HTN, HLD, and arthritis; who presents with complaints of abdominal pain. Symptoms have been progressively worsening over the last 1 month. Eating meals previously had worsened/onset symptoms. She states that previously symptoms were intermittent, but have become more constant. Because of symptoms patient has had decrease oral intake of food and at baseline does not drink a lot of water. Patient was seen in the emergency department 2 days ago and at that time underwent MRI which revealed periportal and central mesenteric adenopathy consistent with lymphoma as well as 4 cm right kidney mass concerning for cell carcinoma.  Patient in room with family at bedside. Per pt, she has not been eating well for a few days PTA. Typically doesn't eat that much at mealtimes. Breakfast is usually a piece of bacon with coffee, lunch consists of 1/2 to a whole sandwich, usually tuna, and dinner is typically a piece of chicken with collard greens or a sweet potato.   Pt reports not having a BM for a few days. Per MD in  rounds, will order Miralax for patient to start. Pt states she is drinking Boost Breeze supplements provided. Would like to try vanilla Ensure once her procedure is done today. Will order. PO intakes this morning: 60% completion. Pt denies any issues chewing or swallowing.   Per chart review, pt has lost 7 lb since 2/21 (6% wt loss x 2 months, significant for time frame). Nutrition-Focused physical exam completed. Findings are moderate fat depletion, mild muscle depletion, and no edema.   Medications: Colace capsule BID, Carafate tablet QID, Protonix tablet daily  Labs reviewed: Low Na  Diet Order:  DIET SOFT Room service appropriate? Yes; Fluid consistency: Thin  Skin:  Reviewed, no issues  Last BM:  4/27  Height:   Ht Readings from Last 1 Encounters:  08/11/16 5\' 1"  (1.549 m)    Weight:   Wt Readings from Last 1 Encounters:  08/11/16 108 lb (49 kg)    Ideal Body Weight:  47.7 kg  BMI:  Body mass index is 20.41 kg/m.  Estimated Nutritional Needs:   Kcal:  1300-1500  Protein:  60-70g  Fluid:  1.5L/day  EDUCATION NEEDS:   Education needs addressed  Clayton Bibles, MS, RD, LDN Pager: [phone] After Hours Pager: [phone]

## 2016-08-13 LAB — CBC
HCT: 30.7 % — ABNORMAL LOW (ref 36.0–46.0)
HEMOGLOBIN: 9.9 g/dL — AB (ref 12.0–15.0)
MCH: 28.7 pg (ref 26.0–34.0)
MCHC: 32.2 g/dL (ref 30.0–36.0)
MCV: 89 fL (ref 78.0–100.0)
Platelets: 228 10*3/uL (ref 150–400)
RBC: 3.45 MIL/uL — ABNORMAL LOW (ref 3.87–5.11)
RDW: 13.7 % (ref 11.5–15.5)
WBC: 6.7 10*3/uL (ref 4.0–10.5)

## 2016-08-13 LAB — PROTIME-INR
INR: 0.99
PROTHROMBIN TIME: 13.1 s (ref 11.4–15.2)

## 2016-08-13 LAB — BASIC METABOLIC PANEL
Anion gap: 6 (ref 5–15)
BUN: 8 mg/dL (ref 6–20)
CALCIUM: 8.7 mg/dL — AB (ref 8.9–10.3)
CHLORIDE: 98 mmol/L — AB (ref 101–111)
CO2: 31 mmol/L (ref 22–32)
CREATININE: 0.53 mg/dL (ref 0.44–1.00)
GFR calc non Af Amer: >60 mL/min (ref >60)
Glucose, Bld: 99 mg/dL (ref 65–99)
Potassium: 4.1 mmol/L (ref 3.5–5.1)
SODIUM: 135 mmol/L (ref 135–145)

## 2016-08-13 NOTE — Progress Notes (Signed)
PROGRESS NOTE    Christina Peterson  TML:[id number] DOB: 13-Oct-1940 DOA: 08/10/2016  PCP: Julius Bowels, MD (Inactive)   Brief Narrative:  Pt admitted for evaluation of abd pain, poor oral intake. MRI abdomen concerning for lymphoma and ? Renal cell carcinoma. IR consulted for assistance.   Assessment & Plan:   Principal Problem:   Hyponatremia - appears to be pre renal in etiology from poor oral intake and dehydration - improving with IVF and better oral intake  - encouraged oral intake again and for now will keep on IVF until oral intake more consistent   - BMP in AM  Active Problems:   Diffuse adenopathy, renal cell mass - concerning for lymphoma and RCC - IR consulted for assistance with biopsy - D/w Dr. Lindi Adie, recommended palliative care consult for [place] discussions  - Dr Lindi Adie recommended PCT to discuss pt's wishes and once biopsy report back if pt desires more aggressive approach, he can see pt in his clinic. He did say that conservative management is reasonable due to significant malignancy burden  - plan for biopsy tomorrow am for diagnosis     Dyspnea on exertion  - with diffuse adenopathy in the chest area - ? Lymphoma  - keep on oxygen as needed     Normocytic normochromic anemia - likely secondary to the above, slight drop in hg overnight likely due to dilutional component from IVF  - no signs of active bleeding - CBC in AM    Essential hypertension - reasonable inpatient control for now    Constipation - placed on bowel regimen and pt has small BM yesterday - keep on same regimen     Hyperlipidemia - continue statin     Abdominal pain - secondary to ? Lymphoma - allow analgesia as needed     Severe PCM - nutritionist consulted   DVT prophylaxis: Lovenox SQ Code Status: Full  Family Communication: Patient at bedside  Disposition Plan: to be determined   Consultants:   IR  Oncology over the phone Dr. Lindi Adie   PCT  Procedures:    None  Antimicrobials:   None  Subjective: No events overnight, reports feeling bit better.   Objective: Vitals:   08/12/16 0619 08/12/16 1353 08/12/16 2152 08/13/16 0634  BP: 130/70 (!) 145/75 138/74 (!) 146/79  Pulse: 62 (!) 56 69 64  Resp: 18 16 16 16   Temp: 98.3 F (36.8 C) 97.8 F (36.6 C) 98.3 F (36.8 C) 97.7 F (36.5 C)  TempSrc: Oral Oral Oral Oral  SpO2: 97% 98% 100% 97%  Weight:      Height:        Intake/Output Summary (Last 24 hours) at 08/13/16 1130 Last data filed at 08/13/16 0811  Gross per 24 hour  Intake             1080 ml  Output              400 ml  Net              680 ml   Filed Weights   08/10/16 2146 08/11/16 0311  Weight: 52.2 kg (115 lb) 49 kg (108 lb)    Examination:  General exam: Appears calm and comfortable  Respiratory system: Respiratory effort normal. Cardiovascular system: S1 & S2 heard, RRR. No JVD, murmurs, rubs, gallops or clicks. No pedal edema. Gastrointestinal system: Abdomen is nondistended, somewhat tender in epigastric area.  Central nervous system: Alert and oriented. No focal neurological deficits. Extremities: Symmetric  5 x 5 power. Skin: No rashes, lesions or ulcers Psychiatry: Judgement and insight appear normal. Mood & affect appropriate.   Data Reviewed: I have personally reviewed following labs and imaging studies  CBC:  Recent Labs Lab 08/09/16 0725 08/10/16 2232 08/12/16 0358 08/13/16 0350  WBC 4.7 8.3 6.8 6.7  NEUTROABS  --  7.0  --   --   HGB 11.3* 11.0* 10.6* 9.9*  HCT 34.6* 33.1* 32.3* 30.7*  MCV 88.3 87.8 88.3 89.0  PLT 240 246 228 161   Basic Metabolic Panel:  Recent Labs Lab 08/10/16 2232 08/10/16 2347 08/11/16 0822 08/12/16 0358 08/13/16 0350  NA 125* 125* 130* 134* 135  K 3.4* 3.5 3.6 3.7 4.1  CL 87* 88* 92* 99* 98*  CO2 27 28 32 28 31  GLUCOSE 108* 101* 88 98 99  BUN 11 11 8 7 8   CREATININE 0.65 0.68 0.69 0.57 0.53  CALCIUM 9.2 9.2 8.8* 8.6* 8.7*   Liver Function  Tests:  Recent Labs Lab 08/09/16 0725 08/10/16 2232 08/12/16 0358  AST 30 31 28   ALT 18 19 15   ALKPHOS 77 75 63  BILITOT 0.7 0.8 0.5  PROT 6.5 6.7 5.6*  ALBUMIN 3.5 3.7 3.1*    Recent Labs Lab 08/09/16 0725 08/10/16 2232  LIPASE 22 19   Cardiac Enzymes:  Recent Labs Lab 08/09/16 0725  TROPONINI <0.03   Urine analysis:    Component Value Date/Time   COLORURINE STRAW (A) 08/11/2016 [place] 08/11/2016 0137   LABSPEC 1.006 08/11/2016 0137   PHURINE 5.0 08/11/2016 0137   GLUCOSEU NEGATIVE 08/11/2016 0137   HGBUR MODERATE (A) 08/11/2016 0137   BILIRUBINUR NEGATIVE 08/11/2016 0137   KETONESUR 5 (A) 08/11/2016 0137   PROTEINUR NEGATIVE 08/11/2016 0137   UROBILINOGEN 0.2 04/30/2011 1145   NITRITE NEGATIVE 08/11/2016 0137   LEUKOCYTESUR NEGATIVE 08/11/2016 0960   Radiology Studies: Ct Chest W Contrast  Result Date: 08/12/2016 CLINICAL DATA:  Mid abdominal pain for 1 month. Worse with eating. MRI demonstrating adenopathy. EXAM: CT CHEST, ABDOMEN, AND PELVIS WITH CONTRAST TECHNIQUE: Multidetector CT imaging of the chest, abdomen and pelvis was performed following the standard protocol during bolus administration of intravenous contrast. CONTRAST:  34mL ISOVUE-300 IOPAMIDOL (ISOVUE-300) INJECTION 61% COMPARISON:  08/09/2016 abdominal MRI. FINDINGS: CT CHEST FINDINGS Cardiovascular: Tortuous thoracic aorta. Moderate cardiomegaly, without pericardial effusion. No central pulmonary embolism, on this non-dedicated study. Mediastinum/Nodes: No supraclavicular adenopathy. Subcentimeter right thyroid nodule is nonspecific. No middle mediastinal or hilar adenopathy in. Necrotic adenopathy in the prevascular space measures 2.2 by 2.0 cm on image 20/series 2. Lungs/Pleura: Small bilateral pleural effusions. Mild bibasilar atelectasis. Musculoskeletal: No acute osseous abnormality. Bilateral glenohumeral joint osteoarthritis. CT ABDOMEN PELVIS FINDINGS Hepatobiliary: Normal  liver. Normal gallbladder, without biliary ductal dilatation. Pancreas: Pancreatic atrophy. Pancreatic head displaced anteriorly by portacaval adenopathy. No pancreatic duct dilatation. Spleen: Hypoattenuating splenic lesions, larger of which measures 2.1 cm. No splenomegaly. Adrenals/Urinary Tract: Normal adrenal glands. Heterogeneous lower pole right renal exophytic 3.6 cm mass. Normal left kidney. No hydronephrosis. Degraded evaluation of the pelvis, secondary to beam hardening artifact from left hip arthroplasty. Grossly normal urinary bladder. Stomach/Bowel: Gastric antrum displaced anteriorly by abdominal adenopathy. No obstruction. Normal colon and terminal ileum. Normal small bowel caliber. Vascular/Lymphatic: Aortic and branch vessel atherosclerosis. Extensive abdominal adenopathy. Nodal mass in the portal caval space measures 4.4 x 5.6 cm on image 57/series 2. Retroperitoneal adenopathy, with a necrotic retrocaval node measuring 3.3 x 4.2 cm on image 66/series 2. Extensive  small bowel mesenteric adenopathy. Pelvic sidewalls not well evaluated. Reproductive: Hysterectomy.  No gross adnexal mass. Other: No significant free fluid. Musculoskeletal: Left hip arthroplasty.  Right hip osteoarthritis. IMPRESSION: 1. Adenopathy within the chest and abdomen, most consistent with lymphoma. 2. Degraded evaluation of the pelvis, secondary to beam hardening artifact from left hip arthroplasty 3. Right renal mass, suspicious for renal cell carcinoma. Please see recent abdominal MRI for further description. 4. Small bilateral pleural effusions. 5.  Aortic atherosclerosis. Electronically Signed   By: Abigail Miyamoto M.D.   On: 08/12/2016 13:30   Ct Abdomen Pelvis W Contrast  Result Date: 08/12/2016 CLINICAL DATA:  Mid abdominal pain for 1 month. Worse with eating. MRI demonstrating adenopathy. EXAM: CT CHEST, ABDOMEN, AND PELVIS WITH CONTRAST TECHNIQUE: Multidetector CT imaging of the chest, abdomen and pelvis was  performed following the standard protocol during bolus administration of intravenous contrast. CONTRAST:  69mL ISOVUE-300 IOPAMIDOL (ISOVUE-300) INJECTION 61% COMPARISON:  08/09/2016 abdominal MRI. FINDINGS: CT CHEST FINDINGS Cardiovascular: Tortuous thoracic aorta. Moderate cardiomegaly, without pericardial effusion. No central pulmonary embolism, on this non-dedicated study. Mediastinum/Nodes: No supraclavicular adenopathy. Subcentimeter right thyroid nodule is nonspecific. No middle mediastinal or hilar adenopathy in. Necrotic adenopathy in the prevascular space measures 2.2 by 2.0 cm on image 20/series 2. Lungs/Pleura: Small bilateral pleural effusions. Mild bibasilar atelectasis. Musculoskeletal: No acute osseous abnormality. Bilateral glenohumeral joint osteoarthritis. CT ABDOMEN PELVIS FINDINGS Hepatobiliary: Normal liver. Normal gallbladder, without biliary ductal dilatation. Pancreas: Pancreatic atrophy. Pancreatic head displaced anteriorly by portacaval adenopathy. No pancreatic duct dilatation. Spleen: Hypoattenuating splenic lesions, larger of which measures 2.1 cm. No splenomegaly. Adrenals/Urinary Tract: Normal adrenal glands. Heterogeneous lower pole right renal exophytic 3.6 cm mass. Normal left kidney. No hydronephrosis. Degraded evaluation of the pelvis, secondary to beam hardening artifact from left hip arthroplasty. Grossly normal urinary bladder. Stomach/Bowel: Gastric antrum displaced anteriorly by abdominal adenopathy. No obstruction. Normal colon and terminal ileum. Normal small bowel caliber. Vascular/Lymphatic: Aortic and branch vessel atherosclerosis. Extensive abdominal adenopathy. Nodal mass in the portal caval space measures 4.4 x 5.6 cm on image 57/series 2. Retroperitoneal adenopathy, with a necrotic retrocaval node measuring 3.3 x 4.2 cm on image 66/series 2. Extensive small bowel mesenteric adenopathy. Pelvic sidewalls not well evaluated. Reproductive: Hysterectomy.  No gross  adnexal mass. Other: No significant free fluid. Musculoskeletal: Left hip arthroplasty.  Right hip osteoarthritis. IMPRESSION: 1. Adenopathy within the chest and abdomen, most consistent with lymphoma. 2. Degraded evaluation of the pelvis, secondary to beam hardening artifact from left hip arthroplasty 3. Right renal mass, suspicious for renal cell carcinoma. Please see recent abdominal MRI for further description. 4. Small bilateral pleural effusions. 5.  Aortic atherosclerosis. Electronically Signed   By: Abigail Miyamoto M.D.   On: 08/12/2016 13:30      Scheduled Meds: . aspirin EC  81 mg Oral Daily  . enoxaparin (LOVENOX) injection  40 mg Subcutaneous Q24H  . feeding supplement  1 Container Oral TID BM  . feeding supplement (ENSURE ENLIVE)  237 mL Oral BID BM  . meloxicam  7.5 mg Oral Daily  . pantoprazole  20 mg Oral Daily  . polyethylene glycol  17 g Oral Daily  . senna-docusate  1 tablet Oral BID  . simvastatin  10 mg Oral QHS  . sucralfate  1 g Oral QID   Continuous Infusions: . sodium chloride 75 mL/hr at 08/12/16 1553     LOS: 2 days   Time spent: 20 minutes   Faye Ramsay, MD [facility] Pager [phone]  If 7PM-7AM, please contact night-coverage [email or web address] Password [email or web address] 08/13/2016, 11:30 AM

## 2016-08-13 NOTE — Progress Notes (Signed)
Patient had an uneventful night. No issues noted. Percocet requested 2100 for 6/10 pain. Patient rested the rest of the night. Will continue to monitor.

## 2016-08-14 ENCOUNTER — Encounter (HOSPITAL_COMMUNITY): Payer: Self-pay | Admitting: Radiology

## 2016-08-14 ENCOUNTER — Inpatient Hospital Stay (HOSPITAL_COMMUNITY): Payer: Medicare Other

## 2016-08-14 DIAGNOSIS — I10 Essential (primary) hypertension: Secondary | ICD-10-CM

## 2016-08-14 DIAGNOSIS — R591 Generalized enlarged lymph nodes: Secondary | ICD-10-CM

## 2016-08-14 DIAGNOSIS — E871 Hypo-osmolality and hyponatremia: Principal | ICD-10-CM

## 2016-08-14 DIAGNOSIS — N2889 Other specified disorders of kidney and ureter: Secondary | ICD-10-CM

## 2016-08-14 DIAGNOSIS — E785 Hyperlipidemia, unspecified: Secondary | ICD-10-CM

## 2016-08-14 DIAGNOSIS — R1030 Lower abdominal pain, unspecified: Secondary | ICD-10-CM

## 2016-08-14 DIAGNOSIS — D649 Anemia, unspecified: Secondary | ICD-10-CM

## 2016-08-14 DIAGNOSIS — E43 Unspecified severe protein-calorie malnutrition: Secondary | ICD-10-CM

## 2016-08-14 LAB — CBC
HCT: 31.1 % — ABNORMAL LOW (ref 36.0–46.0)
HEMOGLOBIN: 10 g/dL — AB (ref 12.0–15.0)
MCH: 28.7 pg (ref 26.0–34.0)
MCHC: 32.2 g/dL (ref 30.0–36.0)
MCV: 89.1 fL (ref 78.0–100.0)
PLATELETS: 233 10*3/uL (ref 150–400)
RBC: 3.49 MIL/uL — AB (ref 3.87–5.11)
RDW: 13.8 % (ref 11.5–15.5)
WBC: 6.4 10*3/uL (ref 4.0–10.5)

## 2016-08-14 LAB — BASIC METABOLIC PANEL
Anion gap: 8 (ref 5–15)
BUN: 6 mg/dL (ref 6–20)
CO2: 28 mmol/L (ref 22–32)
CREATININE: 0.49 mg/dL (ref 0.44–1.00)
Calcium: 8.6 mg/dL — ABNORMAL LOW (ref 8.9–10.3)
Chloride: 99 mmol/L — ABNORMAL LOW (ref 101–111)
Glucose, Bld: 88 mg/dL (ref 65–99)
POTASSIUM: 3.7 mmol/L (ref 3.5–5.1)
Sodium: 135 mmol/L (ref 135–145)

## 2016-08-14 MED ORDER — MIDAZOLAM HCL 2 MG/2ML IJ SOLN
INTRAMUSCULAR | Status: AC | PRN
  Administered 2016-08-14 (×2): 1 mg via INTRAVENOUS

## 2016-08-14 MED ORDER — NALOXONE HCL 0.4 MG/ML IJ SOLN
INTRAMUSCULAR | Status: AC
  Filled 2016-08-14: qty 1

## 2016-08-14 MED ORDER — MIDAZOLAM HCL 2 MG/2ML IJ SOLN
INTRAMUSCULAR | Status: AC
  Filled 2016-08-14: qty 4

## 2016-08-14 MED ORDER — FLUMAZENIL 0.5 MG/5ML IV SOLN
INTRAVENOUS | Status: AC
  Filled 2016-08-14: qty 5

## 2016-08-14 MED ORDER — FENTANYL CITRATE (PF) 100 MCG/2ML IJ SOLN
INTRAMUSCULAR | Status: AC | PRN
  Administered 2016-08-14 (×2): 50 ug via INTRAVENOUS

## 2016-08-14 MED ORDER — FENTANYL CITRATE (PF) 100 MCG/2ML IJ SOLN
INTRAMUSCULAR | Status: AC
  Filled 2016-08-14: qty 4

## 2016-08-14 NOTE — Procedures (Signed)
Pre procedural Dx: Retroperitoneal lymphadenopathy  Post procedural Dx: Same  Technically successful CT guided biopsy of indeterminate retroperitoneal lymphadenopathy.   EBL: None.   Complications: None immediate.   Ronny Bacon, MD Pager #: [phone]  '

## 2016-08-14 NOTE — Progress Notes (Signed)
[facility] PROGRESS NOTE  Christina Peterson CXK:[id number] DOB: 01/30/1941 DOA: 08/10/2016 PCP: Julius Bowels, MD (Inactive)  Interim summary and history of present illness 76 year old female with a past medical history significant arthritis, hypertension and hyperlipidemia; who presented to the emergency department secondary to abdominal pain and poor oral intake. Workup has demonstrated concerns for renal cell carcinoma and lymphoma (see x-ray reports for MRI findings). Patient also found to be dehydrated with hyponatremia.  Assessment/Plan: 1-diffuse adenopathy and renal cell mass -As seen on MRI concern for lymphoma and renal cell carcinoma -Intervention radiology was consulted and the patient is status post CT-guided biopsy of retroperitoneal lymphadenopathy. -The case was discussed per Dr. Doyle Askew with Dr. Lindi Adie (oncologist); plan is to follow biopsy results and to reassess patient as an outpatient. -Further treatment to be decided based on biopsy results  2-dehydration/hyponatremia -Appears to be secondary to poor oral intake -Patient encouraged to increase by mouth -We will continue IV fluid resuscitation -Sodium level has improved  3-essential hypertension -Blood pressure stable will monitor and continue current antihypertensive regimen  4-constipation -Had a small bowel movement with current bowel regimen -Continue good hydration and continue current laxatives  5-severe protein calorie malnutrition -Nutritional services was consulted -Will follow recommendations for feeding supplements  6-hyperlipidemia -Will continue statins  7-normocytic normochromic anemia -No signs of acute bleeding -Will monitor hemoglobin trend  Code Status: Full code Family Communication: grandaughter at bedside Disposition Plan: Patient to be discharged home most likely in the next 24-48 hours, outpatient follow-up with oncology. We'll advance her diet and assess for good  tolerance.   Consultants:  Oncology  Interventional radiology  Procedures:  CT guided biopsy of retroperitoneal lymphadenopathy   5/2  Antibiotics:  None  HPI/Subjective: Patient in no acute distress; she is afebrile and denying nausea, vomiting, chest pain or shortness of breath.  Objective: Vitals:   08/14/16 1314 08/14/16 1522  BP: 131/72 130/68  Pulse: 62 64  Resp: 18 18  Temp:  98.3 F (36.8 C)    Intake/Output Summary (Last 24 hours) at 08/14/16 1734 Last data filed at 08/14/16 1523  Gross per 24 hour  Intake           4857.5 ml  Output                3 ml  Net           4854.5 ml   Filed Weights   08/10/16 2146 08/11/16 0311  Weight: 52.2 kg (115 lb) 49 kg (108 lb)    Exam:   General:  Chronically ill appearance, underweight, denying chest pain and shortness of breath.  Cardiovascular: S1 and S2, no rubs, no gallops  Respiratory: Good air movement, no wheezing, no crackles  Abdomen: Soft, Slight tenderness with deep palpation, no guarding, positive bowel sounds  Musculoskeletal: No edema, no cyanosis   Data Reviewed: Basic Metabolic Panel:  Recent Labs Lab 08/10/16 2347 08/11/16 0822 08/12/16 0358 08/13/16 0350 08/14/16 0407  NA 125* 130* 134* 135 135  K 3.5 3.6 3.7 4.1 3.7  CL 88* 92* 99* 98* 99*  CO2 28 32 28 31 28   GLUCOSE 101* 88 98 99 88  BUN 11 8 7 8 6   CREATININE 0.68 0.69 0.57 0.53 0.49  CALCIUM 9.2 8.8* 8.6* 8.7* 8.6*   Liver Function Tests:  Recent Labs Lab 08/09/16 0725 08/10/16 2232 08/12/16 0358  AST 30 31 28   ALT 18 19 15   ALKPHOS 77 75 63  BILITOT 0.7 0.8 0.5  PROT 6.5 6.7 5.6*  ALBUMIN 3.5 3.7 3.1*    Recent Labs Lab 08/09/16 0725 08/10/16 2232  LIPASE 22 19   CBC:  Recent Labs Lab 08/09/16 0725 08/10/16 2232 08/12/16 0358 08/13/16 0350 2016-09-04 0407  WBC 4.7 8.3 6.8 6.7 6.4  NEUTROABS  --  7.0  --   --   --   HGB 11.3* 11.0* 10.6* 9.9* 10.0*  HCT 34.6* 33.1* 32.3* 30.7* 31.1*  MCV 88.3  87.8 88.3 89.0 89.1  PLT 240 246 228 228 233   Cardiac Enzymes:  Recent Labs Lab 08/09/16 0725  TROPONINI <0.03    Studies: Ct Biopsy  Result Date: 09-04-16 INDICATION: No known primary, now with indeterminate right-sided renal lesion and retroperitoneal lymphadenopathy. Please perform CT-guided biopsy for tissue diagnostic purposes. EXAM: CT-GUIDED BIOPSY OF INDETERMINATE RIGHT-SIDED RETROPERITONEAL LYMPHADENOPATHY. COMPARISON:  CT the chest, abdomen and pelvis - 07/16/2016 MEDICATIONS: None. ANESTHESIA/SEDATION: Fentanyl 100 mcg IV; Versed 2 mg IV Sedation time: 15 minutes; The patient was continuously monitored during the procedure by the interventional radiology nurse under my direct supervision. CONTRAST:  None. COMPLICATIONS: None immediate. PROCEDURE: Informed consent was obtained from the patient following an explanation of the procedure, risks, benefits and alternatives. A time out was performed prior to the initiation of the procedure. The patient was positioned prone on the CT table and a limited CT was performed for procedural planning demonstrating unchanged size and appearance of the dominant right-sided retroperitoneal nodal conglomeration with dominant component measuring approximately 3.5 x 2.9 cm (image 25, series 3). The procedure was planned. The operative site was prepped and draped in the usual sterile fashion. Appropriate trajectory was confirmed with a 22 gauge spinal needle after the adjacent tissues were anesthetized with 1% Lidocaine with epinephrine. Under intermittent CT guidance, a 17 gauge coaxial needle was advanced into the peripheral aspect of the mass. Appropriate positioning was confirmed and 5 core needle biopsy samples were obtained with an 18 gauge core needle biopsy device. The co-axial needle was removed and hemostasis was achieved with manual compression. A limited postprocedural CT was negative for hemorrhage or additional complication. A dressing was placed.  The patient tolerated the procedure well without immediate postprocedural complication. IMPRESSION: Technically successful CT guided core needle biopsy of indeterminate right-sided retroperitoneal nodal conglomeration. Electronically Signed   By: Sandi Mariscal M.D.   On: 2016/09/04 13:50    Scheduled Meds: . aspirin EC  81 mg Oral Daily  . enoxaparin (LOVENOX) injection  40 mg Subcutaneous Q24H  . feeding supplement  1 Container Oral TID BM  . feeding supplement (ENSURE ENLIVE)  237 mL Oral BID BM  . fentaNYL      . meloxicam  7.5 mg Oral Daily  . midazolam      . pantoprazole  20 mg Oral Daily  . polyethylene glycol  17 g Oral Daily  . senna-docusate  1 tablet Oral BID  . simvastatin  10 mg Oral QHS  . sucralfate  1 g Oral QID   Continuous Infusions: . sodium chloride 75 mL/hr at 2016-09-04 0136    Principal Problem:   Hyponatremia Active Problems:   Normocytic normochromic anemia   Essential hypertension   Constipation   Hyperlipidemia   Abdominal pain   Renal mass, right   Other intra-abdominal and pelvic swelling, mass and lump    Time spent: 25 minutes    Barton Dubois  [facility] Pager [phone]. If 7PM-7AM, please contact night-coverage at [email or web address], password [facility] 09-04-16, 5:34 PM  LOS:  3 days

## 2016-08-14 NOTE — Progress Notes (Addendum)
MEDICATION-RELATED CONSULT NOTE   IR Procedure Consult - Anticoagulant/Antiplatelet PTA/Inpatient Med List Review by Pharmacist    Procedure: CT guided biopsy of indeterminate retroperitoneal lymphadenopathy    Completed: 08/14/16 at ~1PM  Post-Procedural bleeding risk per IR MD assessment:  standard  Antithrombotic medications on inpatient or PTA profile prior to procedure:   lovenox 40 mg q24h (last charted dose on 4/30 at 0726); aspirin 81 mg daily    Recommended restart time per IR Post-Procedure Guidelines:  Next AM   Plan:     - resume lovenox 40 mg SQ q24h and aspirin 81 mg on 08/15/16 at 1000 - pharmacy will sign off.  Reconsult [place] if need furhter assistance  Dia Sitter, PharmD, BCPS 08/14/2016 3:45 PM

## 2016-08-15 DIAGNOSIS — E43 Unspecified severe protein-calorie malnutrition: Secondary | ICD-10-CM

## 2016-08-15 DIAGNOSIS — K5900 Constipation, unspecified: Secondary | ICD-10-CM

## 2016-08-15 LAB — CBC
HCT: 34.2 % — ABNORMAL LOW (ref 36.0–46.0)
Hemoglobin: 10.9 g/dL — ABNORMAL LOW (ref 12.0–15.0)
MCH: 28.5 pg (ref 26.0–34.0)
MCHC: 31.9 g/dL (ref 30.0–36.0)
MCV: 89.5 fL (ref 78.0–100.0)
PLATELETS: 261 10*3/uL (ref 150–400)
RBC: 3.82 MIL/uL — ABNORMAL LOW (ref 3.87–5.11)
RDW: 14 % (ref 11.5–15.5)
WBC: 6.9 10*3/uL (ref 4.0–10.5)

## 2016-08-15 LAB — BASIC METABOLIC PANEL
Anion gap: 8 (ref 5–15)
BUN: <5 mg/dL — ABNORMAL LOW (ref 6–20)
CALCIUM: 9 mg/dL (ref 8.9–10.3)
CO2: 28 mmol/L (ref 22–32)
CREATININE: 0.54 mg/dL (ref 0.44–1.00)
Chloride: 98 mmol/L — ABNORMAL LOW (ref 101–111)
Glucose, Bld: 97 mg/dL (ref 65–99)
Potassium: 3.9 mmol/L (ref 3.5–5.1)
SODIUM: 134 mmol/L — AB (ref 135–145)

## 2016-08-15 MED ORDER — POLYETHYLENE GLYCOL 3350 17 G PO PACK: 17.0000 g | PACK | Freq: Every day | ORAL | 1 refills | Status: DC

## 2016-08-15 MED ORDER — BOOST / RESOURCE BREEZE PO LIQD: 1.0000 | Freq: Two times a day (BID) | ORAL | 0 refills | Status: DC

## 2016-08-15 MED ORDER — DOCUSATE SODIUM 100 MG PO CAPS: 100.0000 mg | ORAL_CAPSULE | Freq: Two times a day (BID) | ORAL | 1 refills | Status: AC

## 2016-08-15 MED ORDER — LOSARTAN POTASSIUM 50 MG PO TABS: 50.0000 mg | ORAL_TABLET | Freq: Every day | ORAL | 1 refills | Status: DC

## 2016-08-15 MED ORDER — OXYCODONE-ACETAMINOPHEN 5-325 MG PO TABS: 1.0000 | ORAL_TABLET | ORAL | 0 refills | Status: DC | PRN

## 2016-08-15 MED ORDER — ENSURE ENLIVE PO LIQD: 237.0000 mL | Freq: Two times a day (BID) | ORAL | 3 refills | Status: DC

## 2016-08-15 NOTE — Discharge Summary (Signed)
Physician Discharge Summary  Christina Peterson QBH:[id number] DOB: 1941/02/18 DOA: 08/10/2016  PCP: Julius Bowels, MD   Admit date: 08/10/2016 Discharge date: 08/15/2016  Time spent: 35 minutes  Recommendations for Outpatient Follow-up:  1. Repeat CBC to follow Hgb trend 2. Reassess BP and adjust antihypertensive regimen as needed  3. Patient to follow up with oncologist for biopsy results and to determine further treatment. 4. Outpatient follow up with urology service, for renal cell carcinoma.   Discharge Diagnoses:  Dehydration/ Hyponatremia Normocytic normochromic anemia Essential hypertension Constipation Hyperlipidemia Abdominal pain Renal mass, right (RCC) Other intra-abdominal and pelvic swelling, mass and lump (with significant lymphadenopathy) Severe protein-calorie malnutrition ([place])   Discharge Condition: stable. Patient discharge home with instructions to follow up with oncology service in 1 week and with PCP in 10 days.  Diet recommendation: regular diet   Filed Weights   08/10/16 2146 08/11/16 0311  Weight: 52.2 kg (115 lb) 49 kg (108 lb)    History of present illness:  76 year old female with a past medical history significant arthritis, hypertension and hyperlipidemia; who presented to the emergency department secondary to abdominal pain and poor oral intake. Workup has demonstrated concerns for renal cell carcinoma and lymphoma (see x-ray reports for MRI findings). Patient also found to be dehydrated with hyponatremia. For further details on admission please refer to H&P written by Dr. Tamala Julian on 08/11/16  Hospital Course:  1-diffuse adenopathy and renal cell mass -As seen on MRI concern for lymphoma and renal cell carcinoma -Intervention radiology was consulted and the patient is status post CT-guided biopsy of retroperitoneal lymphadenopathy. -The case was discussed per Dr. Doyle Askew with Dr. Lindi Adie (oncologist); plan is to follow biopsy results and to reassess  patient as an outpatient in 1 week. -Further treatment to be decided based on biopsy results and treatment options. -will also need outpatient follow up with urology service for renal cell carcinoma  2-dehydration/hyponatremia -Secondary to poor oral intake -Patient encouraged to increase by mouth intake and to maintain adequate hydration -at discharge electrolytes WNL  3-essential hypertension -Blood pressure rising at discharge -HCTZ discontinued -patient discharge on Cozaar  4-Constipation -most likely associated with chronic use of narcotics -discharge on colace and miralax -patient had BM prior to discharge -encourage to maintain adequate hydration   5-severe protein calorie malnutrition -Nutritional services was consulted -Will follow recommendations for feeding supplements -patient discharge on Ensure and Breeze   6-hyperlipidemia -Will continue statins  7-normocytic normochromic anemia -No signs of acute bleeding -Will recommend CBC at follow up visit to monitor hemoglobin trend  Procedures:  CT guided biopsy of retroperitoneal lymphadenopathy   5/2  Consultations:  Oncology  IR   Discharge Exam: Vitals:   08/14/16 2033 08/15/16 0419  BP: 136/67 (!) 148/72  Pulse: 68 65  Resp: 16 18  Temp: 98 F (36.7 C) 98.3 F (36.8 C)    General:  Chronically ill appearance and underweight; denying chest pain and shortness of breath.  Cardiovascular: S1 and S2, no rubs, no gallops  Respiratory: Good air movement, no wheezing, no crackles  Abdomen: Soft, Slight tenderness with deep palpation, no guarding, positive bowel sounds  Musculoskeletal: No edema, no cyanosis    Discharge Instructions   Discharge Instructions    Discharge instructions    Complete by:  As directed    Take medications as prescribed  Arranged follow up with PCP in 10 days Follow with Dr. Lindi Adie in 1 week (oncology service) Keep yourself well hydrated and follow good  nutrition  Current Discharge Medication List    START taking these medications   Details  !! feeding supplement (BOOST / RESOURCE BREEZE) LIQD Take 1 Container by mouth 2 (two) times daily between meals. Qty: 15000 mL, Refills: 0    !! feeding supplement, ENSURE ENLIVE, (ENSURE ENLIVE) LIQD Take 237 mLs by mouth 2 (two) times daily between meals. Qty: 4500 mL, Refills: 3    losartan (COZAAR) 50 MG tablet Take 1 tablet (50 mg total) by mouth daily. Qty: 30 tablet, Refills: 1    oxyCODONE-acetaminophen (PERCOCET/ROXICET) 5-325 MG tablet Take 1 tablet by mouth every 4 (four) hours as needed for severe pain. Qty: 30 tablet, Refills: 0    polyethylene glycol (MIRALAX / GLYCOLAX) packet Take 17 g by mouth daily. Qty: 28 each, Refills: 1     !! - Potential duplicate medications found. Please discuss with provider.    CONTINUE these medications which have CHANGED   Details  docusate sodium (COLACE) 100 MG capsule Take 1 capsule (100 mg total) by mouth every 12 (twelve) hours. Qty: 60 capsule, Refills: 1      CONTINUE these medications which have NOT CHANGED   Details  aspirin EC 81 MG tablet Take 81 mg by mouth daily.    Calcium Citrate-Vitamin D (CITRACAL MAXIMUM PO) Take 2 tablets by mouth daily.     meloxicam (MOBIC) 7.5 MG tablet Take 7.5 mg by mouth daily.     pantoprazole (PROTONIX) 20 MG tablet Take 1 tablet (20 mg total) by mouth daily. Qty: 30 tablet, Refills: 0    simvastatin (ZOCOR) 10 MG tablet Take 10 mg by mouth at bedtime.      sucralfate (CARAFATE) 1 g tablet Take 1 tablet (1 g total) by mouth 4 (four) times daily. Qty: 30 tablet, Refills: 0      STOP taking these medications     benazepril-hydrochlorthiazide (LOTENSIN HCT) 20-12.5 MG per tablet      HYDROcodone-acetaminophen (NORCO/VICODIN) 5-325 MG tablet      naproxen sodium (ANAPROX) 220 MG tablet      benzonatate (TESSALON) 100 MG capsule      HYDROcodone-acetaminophen (NORCO/VICODIN) 5-325 MG  per tablet        No Known Allergies Follow-up Information    KILPATRICK,LAUREL, MD. Schedule an appointment as soon as possible for a visit in 10 day(s).   Specialty:  Professional Counselor       Rulon Eisenmenger, MD. Schedule an appointment as soon as possible for a visit in 1 week(s).   Specialty:  Hematology and Oncology Contact information: [place] [address] [phone]            The results of significant diagnostics from this hospitalization (including imaging, microbiology, ancillary and laboratory) are listed below for reference.    Significant Diagnostic Studies: Dg Abd 1 View  Result Date: 08/11/2016 CLINICAL DATA:  Nausea, constipation EXAM: ABDOMEN - 1 VIEW COMPARISON:  None. FINDINGS: The bowel gas pattern is normal. Phleboliths are noted within the pelvis. There is lower lumbar degenerative disc and facet arthropathy from approximately L4 caudad with osteoarthritis of the sacroiliac joints bilaterally and pubic symphysis. The partially visualized left total hip arthroplasty is noted with the acetabular component placed slightly more cephalad than the normal expected location of the left acetabulum. 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 The right hip demonstrates native joint space narrowing. IMPRESSION: No significant stool burden. No bowel obstruction. 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 Electronically Signed   By: Ashley Royalty M.D.   On: 08/11/2016 03:15   Ct Chest W Contrast  Result Date: 08/12/2016 CLINICAL DATA:  Mid abdominal pain for 1 month. Worse with eating. MRI demonstrating adenopathy. EXAM: CT CHEST, ABDOMEN, AND PELVIS WITH CONTRAST TECHNIQUE: Multidetector CT imaging of the chest, abdomen and pelvis was performed following the  standard protocol during bolus administration of intravenous contrast. CONTRAST:  23mL ISOVUE-300 IOPAMIDOL (ISOVUE-300) INJECTION 61% COMPARISON:  08/09/2016 abdominal MRI. FINDINGS: CT CHEST FINDINGS Cardiovascular: Tortuous thoracic aorta. Moderate cardiomegaly, without pericardial effusion. No central pulmonary embolism, on this non-dedicated study. Mediastinum/Nodes: No supraclavicular adenopathy. Subcentimeter right thyroid nodule is nonspecific. No middle mediastinal or hilar adenopathy in. Necrotic adenopathy in the prevascular space measures 2.2 by 2.0 cm on image 20/series 2. Lungs/Pleura: Small bilateral pleural effusions. Mild bibasilar atelectasis. Musculoskeletal: No acute osseous abnormality. Bilateral glenohumeral joint osteoarthritis. CT ABDOMEN PELVIS FINDINGS Hepatobiliary: Normal liver. Normal gallbladder, without biliary ductal dilatation. Pancreas: Pancreatic atrophy. Pancreatic head displaced anteriorly by portacaval adenopathy. No pancreatic duct dilatation. Spleen: Hypoattenuating splenic lesions, larger of which measures 2.1 cm. No splenomegaly. Adrenals/Urinary Tract: Normal adrenal glands. Heterogeneous lower pole right renal exophytic 3.6 cm mass. Normal left kidney. No hydronephrosis. Degraded evaluation of the pelvis, secondary to beam hardening artifact from left hip arthroplasty. Grossly normal urinary bladder. Stomach/Bowel: Gastric antrum displaced anteriorly by abdominal adenopathy. No obstruction. Normal colon and terminal ileum. Normal small bowel caliber. Vascular/Lymphatic: Aortic and branch vessel atherosclerosis. Extensive abdominal adenopathy. Nodal mass in the portal caval space measures 4.4 x 5.6 cm on image 57/series 2. Retroperitoneal adenopathy, with a necrotic retrocaval node measuring 3.3 x 4.2 cm on image 66/series 2. Extensive small bowel mesenteric adenopathy. Pelvic sidewalls not well evaluated. Reproductive: Hysterectomy.  No gross adnexal mass. Other: No  significant free fluid. Musculoskeletal: Left hip arthroplasty.  Right hip osteoarthritis. IMPRESSION: 1. Adenopathy within the chest and abdomen, most consistent with lymphoma. 2. Degraded evaluation of the pelvis, secondary to beam hardening artifact from left hip arthroplasty 3. Right renal mass, suspicious for renal cell carcinoma. Please see recent abdominal MRI for further description. 4. Small bilateral pleural effusions. 5.  Aortic atherosclerosis. Electronically Signed   By: Abigail Miyamoto M.D.   On: 08/12/2016 13:30   Mr Abdomen W Or Wo Contrast  Result Date: 08/09/2016 CLINICAL DATA:  Abdominal mass. Abdominal pain. Nausea and vomiting. EXAM: MRI ABDOMEN WITHOUT AND WITH CONTRAST TECHNIQUE: Multiplanar multisequence MR imaging of the abdomen was performed both before and after the administration of intravenous contrast. CONTRAST:  36mL MULTIHANCE GADOBENATE DIMEGLUMINE 529 MG/ML IV SOLN COMPARISON:  Ultrasound 08/09/2016 FINDINGS: Lower chest: Lung bases are clear. Hepatobiliary: No focal hepatic lesion. Large nodal mass in the porta hepatis measuring 6.0 x 4.2 cm is most consistent with large periportal lymph node. There is no biliary duct dilatation. Gallbladder normal. Common bile duct is difficult to follow. Portal veins are patent. Pancreas: Pancreas is difficult to evaluate. There is bulky adenopathy within the mesenteries adjacent the pancreas which makes defining the pancreatic tissue difficult. No discrete pancreatic lesion is identified. Spleen: Single 1.6 cm lesion within the spleen. Cannot exclude lymphoma lesion within the spleen. Spleen is normal volume.) Adrenals/urinary tract: Adrenal glands are normal. Along the lateral cortex of the inferior RIGHT kidney there is a 3.4 x 4.0 cm enhancing mass (image 55, series 1501. This is clearly depicted on  T2 weighted image 34, series 3. Stomach/Bowel: Stomach, small-bowel in limited view the: Unremarkable. No bowel obstruction Vascular/Lymphatic:  Abdominal aorta normal caliber. There is bulky adenopathy within the small bowel mesenteries centrally. Coronal image 12 series 10 and demonstrates no masses well. Mass lesions also demonstrated well on the eighth diffusion-weighted Ob 500 series. Example noted the LEFT upper abdomen measures 3.1 cm short axis. Course periportal nodes again demonstrated. Retroperitoneal nodes LEFT of the aorta measure 1.6 cm short axis (image 51, series 4. Likely large. Caval lymph node measuring 3.0 cm (image 54, series 4 Other: No free fluid. Musculoskeletal: No aggressive osseous lesion. IMPRESSION: 1. 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 Retroperitoneal adenopathy extends along the aorta and IVC 2. Indeterminate lesion within normal volume spleen. 3. Recommend oncology consultation and FDG PET scan for further staging and biopsy planning. 4. Additional 4 cm lesion exophytic from the inferior pole the RIGHT kidney is concerning for RENAL CELL CARCINOMA. Electronically Signed   By: Suzy Bouchard M.D.   On: 08/09/2016 12:35   US Abdomen Complete  Result Date: 08/09/2016 CLINICAL DATA:  Abdominal pain for 3 months EXAM: ABDOMEN ULTRASOUND COMPLETE COMPARISON:  None. FINDINGS: Gallbladder: No gallstones or wall thickening visualized. No sonographic Murphy sign noted by sonographer. Common bile duct: Diameter: Normal caliber, 2 mm Liver: There is a 5.9 x 4.8 x 4.6 cm mixed echotexture solid lesion in the left hepatic lobe. Overall, the lesion is hypoechoic. This is nonspecific and warrants additional characterization. IVC: No abnormality visualized. Pancreas: Enlarged hypoechoic mass in the pancreatic tail measures 8.3 x 7.8 x 5.9 cm. Hypoechoic mass in the region the pancreatic head measures 3.2 x 2.2 x 4.5 cm. Spleen: Normal size. Hypoechoic lesion centrally within the spleen measures 1.9 x 1.5 x 1.3 cm. Right Kidney: Length: 8.1 cm. No hydronephrosis. There is a solid mass off the  lower pole of the right kidney measuring 3.4 x 3.3 x 3.2 cm. 1.6 cm exophytic cyst off the midpole. Left Kidney: Length: 9.8 cm. Echogenicity within normal limits. No mass or hydronephrosis visualized. Abdominal aorta: No aneurysm visualized. Other findings: Within the right lower quadrant, there is a solid mass measuring 5.8 x 3.7 x 3.2 cm of unknown etiology or source. This is adjacent to the aorta. IMPRESSION: Numerous solid organ abnormalities. 5.9 cm hypoechoic solid mass in the left hepatic lobe. At least 2 large masses in the pancreas, both pancreatic head and pancreatic tail. Hypoechoic lesions centrally within the spleen. 3.4 cm solid mass off the lower pole of the right kidney. In addition, there is a large soft tissue mass in the right lower quadrant of the abdomen adjacent to the aorta measuring up to 5.8 cm. These areas are concerning for malignancy and warrant further evaluation. Recommend MRI of the abdomen without and with contrast for further evaluation. Electronically Signed   By: Rolm Baptise M.D.   On: 08/09/2016 09:24   Ct Abdomen Pelvis W Contrast  Result Date: 08/12/2016 CLINICAL DATA:  Mid abdominal pain for 1 month. Worse with eating. MRI demonstrating adenopathy. EXAM: CT CHEST, ABDOMEN, AND PELVIS WITH CONTRAST TECHNIQUE: Multidetector CT imaging of the chest, abdomen and pelvis was performed following the standard protocol during bolus administration of intravenous contrast. CONTRAST:  75mL ISOVUE-300 IOPAMIDOL (ISOVUE-300) INJECTION 61% COMPARISON:  08/09/2016 abdominal MRI. FINDINGS: CT CHEST FINDINGS Cardiovascular: Tortuous thoracic aorta. Moderate cardiomegaly, without pericardial effusion. No central pulmonary embolism, on this non-dedicated study. Mediastinum/Nodes: No supraclavicular adenopathy. Subcentimeter right thyroid nodule  is nonspecific. No middle mediastinal or hilar adenopathy in. Necrotic adenopathy in the prevascular space measures 2.2 by 2.0 cm on image 20/series  2. Lungs/Pleura: Small bilateral pleural effusions. Mild bibasilar atelectasis. Musculoskeletal: No acute osseous abnormality. Bilateral glenohumeral joint osteoarthritis. CT ABDOMEN PELVIS FINDINGS Hepatobiliary: Normal liver. Normal gallbladder, without biliary ductal dilatation. Pancreas: Pancreatic atrophy. Pancreatic head displaced anteriorly by portacaval adenopathy. No pancreatic duct dilatation. Spleen: Hypoattenuating splenic lesions, larger of which measures 2.1 cm. No splenomegaly. Adrenals/Urinary Tract: Normal adrenal glands. Heterogeneous lower pole right renal exophytic 3.6 cm mass. Normal left kidney. No hydronephrosis. Degraded evaluation of the pelvis, secondary to beam hardening artifact from left hip arthroplasty. Grossly normal urinary bladder. Stomach/Bowel: Gastric antrum displaced anteriorly by abdominal adenopathy. No obstruction. Normal colon and terminal ileum. Normal small bowel caliber. Vascular/Lymphatic: Aortic and branch vessel atherosclerosis. Extensive abdominal adenopathy. Nodal mass in the portal caval space measures 4.4 x 5.6 cm on image 57/series 2. Retroperitoneal adenopathy, with a necrotic retrocaval node measuring 3.3 x 4.2 cm on image 66/series 2. Extensive small bowel mesenteric adenopathy. Pelvic sidewalls not well evaluated. Reproductive: Hysterectomy.  No gross adnexal mass. Other: No significant free fluid. Musculoskeletal: Left hip arthroplasty.  Right hip osteoarthritis. IMPRESSION: 1. Adenopathy within the chest and abdomen, most consistent with lymphoma. 2. Degraded evaluation of the pelvis, secondary to beam hardening artifact from left hip arthroplasty 3. Right renal mass, suspicious for renal cell carcinoma. Please see recent abdominal MRI for further description. 4. Small bilateral pleural effusions. 5.  Aortic atherosclerosis. Electronically Signed   By: Abigail Miyamoto M.D.   On: 08/12/2016 13:30   Ct Biopsy  Result Date: 08/14/2016 INDICATION: No known  primary, now with indeterminate right-sided renal lesion and retroperitoneal lymphadenopathy. Please perform CT-guided biopsy for tissue diagnostic purposes. EXAM: CT-GUIDED BIOPSY OF INDETERMINATE RIGHT-SIDED RETROPERITONEAL LYMPHADENOPATHY. COMPARISON:  CT the chest, abdomen and pelvis - 07/16/2016 MEDICATIONS: None. ANESTHESIA/SEDATION: Fentanyl 100 mcg IV; Versed 2 mg IV Sedation time: 15 minutes; The patient was continuously monitored during the procedure by the interventional radiology nurse under my direct supervision. CONTRAST:  None. COMPLICATIONS: None immediate. PROCEDURE: Informed consent was obtained from the patient following an explanation of the procedure, risks, benefits and alternatives. A time out was performed prior to the initiation of the procedure. The patient was positioned prone on the CT table and a limited CT was performed for procedural planning demonstrating unchanged size and appearance of the dominant right-sided retroperitoneal nodal conglomeration with dominant component measuring approximately 3.5 x 2.9 cm (image 25, series 3). The procedure was planned. The operative site was prepped and draped in the usual sterile fashion. Appropriate trajectory was confirmed with a 22 gauge spinal needle after the adjacent tissues were anesthetized with 1% Lidocaine with epinephrine. Under intermittent CT guidance, a 17 gauge coaxial needle was advanced into the peripheral aspect of the mass. Appropriate positioning was confirmed and 5 core needle biopsy samples were obtained with an 18 gauge core needle biopsy device. The co-axial needle was removed and hemostasis was achieved with manual compression. A limited postprocedural CT was negative for hemorrhage or additional complication. A dressing was placed. The patient tolerated the procedure well without immediate postprocedural complication. IMPRESSION: Technically successful CT guided core needle biopsy of indeterminate right-sided  retroperitoneal nodal conglomeration. Electronically Signed   By: Sandi Mariscal M.D.   On: 08/14/2016 13:50   Labs: Basic Metabolic Panel:  Recent Labs Lab 08/11/16 0822 08/12/16 0358 08/13/16 0350 08/14/16 0407 08/15/16 0444  NA 130* 134* 135  135 134*  K 3.6 3.7 4.1 3.7 3.9  CL 92* 99* 98* 99* 98*  CO2 32 28 31 28 28   GLUCOSE 88 98 99 88 97  BUN 8 7 8 6  <5*  CREATININE 0.69 0.57 0.53 0.49 0.54  CALCIUM 8.8* 8.6* 8.7* 8.6* 9.0   Liver Function Tests:  Recent Labs Lab 08/09/16 0725 08/10/16 2232 08/12/16 0358  AST 30 31 28   ALT 18 19 15   ALKPHOS 77 75 63  BILITOT 0.7 0.8 0.5  PROT 6.5 6.7 5.6*  ALBUMIN 3.5 3.7 3.1*    Recent Labs Lab 08/09/16 0725 08/10/16 2232  LIPASE 22 19   CBC:  Recent Labs Lab 08/10/16 2232 08/12/16 0358 08/13/16 0350 08/14/16 0407 08/15/16 0444  WBC 8.3 6.8 6.7 6.4 6.9  NEUTROABS 7.0  --   --   --   --   HGB 11.0* 10.6* 9.9* 10.0* 10.9*  HCT 33.1* 32.3* 30.7* 31.1* 34.2*  MCV 87.8 88.3 89.0 89.1 89.5  PLT 246 228 228 233 261   Cardiac Enzymes:  Recent Labs Lab 08/09/16 0725  TROPONINI <0.03    Signed:  Barton Dubois MD.  [facility] 08/15/2016, 10:52 AM

## 2016-08-15 NOTE — Care Management Important Message (Signed)
Important Message  Patient Details  Name: Christina Peterson MRN: [id number] Date of Birth: February 07, 1941   Medicare Important Message Given:  Yes    Kerin Salen 08/15/2016, 10:06 AMImportant Message  Patient Details  Name: Christina Peterson MRN: [id number] Date of Birth: April 02, 1941   Medicare Important Message Given:  Yes    Kerin Salen 08/15/2016, 10:06 AM

## 2016-08-20 DIAGNOSIS — I700 Atherosclerosis of aorta: Secondary | ICD-10-CM | POA: Insufficient documentation

## 2016-08-20 DIAGNOSIS — Z8679 Personal history of other diseases of the circulatory system: Secondary | ICD-10-CM | POA: Insufficient documentation

## 2016-08-21 ENCOUNTER — Encounter (HOSPITAL_COMMUNITY): Payer: Self-pay | Admitting: Emergency Medicine

## 2016-08-21 ENCOUNTER — Inpatient Hospital Stay (HOSPITAL_COMMUNITY)
Admission: EM | Admit: 2016-08-21 | Discharge: 2016-08-27 | DRG: 640 | Disposition: A | Discharge status: Home / Self Care | Payer: Medicare Other | Attending: Internal Medicine | Admitting: Internal Medicine

## 2016-08-21 DIAGNOSIS — C8510 Unspecified B-cell lymphoma, unspecified site: Secondary | ICD-10-CM | POA: Diagnosis not present

## 2016-08-21 DIAGNOSIS — E876 Hypokalemia: Secondary | ICD-10-CM | POA: Diagnosis not present

## 2016-08-21 DIAGNOSIS — R109 Unspecified abdominal pain: Secondary | ICD-10-CM | POA: Diagnosis present

## 2016-08-21 DIAGNOSIS — R1084 Generalized abdominal pain: Secondary | ICD-10-CM

## 2016-08-21 DIAGNOSIS — N2889 Other specified disorders of kidney and ureter: Secondary | ICD-10-CM | POA: Diagnosis not present

## 2016-08-21 DIAGNOSIS — C649 Malignant neoplasm of unspecified kidney, except renal pelvis: Secondary | ICD-10-CM | POA: Diagnosis present

## 2016-08-21 DIAGNOSIS — C8330 Diffuse large B-cell lymphoma, unspecified site: Secondary | ICD-10-CM | POA: Diagnosis present

## 2016-08-21 DIAGNOSIS — I340 Nonrheumatic mitral (valve) insufficiency: Secondary | ICD-10-CM | POA: Diagnosis present

## 2016-08-21 DIAGNOSIS — I341 Nonrheumatic mitral (valve) prolapse: Secondary | ICD-10-CM | POA: Diagnosis present

## 2016-08-21 DIAGNOSIS — E7800 Pure hypercholesterolemia, unspecified: Secondary | ICD-10-CM | POA: Diagnosis present

## 2016-08-21 DIAGNOSIS — R627 Adult failure to thrive: Secondary | ICD-10-CM | POA: Diagnosis present

## 2016-08-21 DIAGNOSIS — Z6820 Body mass index (BMI) 20.0-20.9, adult: Secondary | ICD-10-CM

## 2016-08-21 DIAGNOSIS — D649 Anemia, unspecified: Secondary | ICD-10-CM | POA: Diagnosis present

## 2016-08-21 DIAGNOSIS — N289 Disorder of kidney and ureter, unspecified: Secondary | ICD-10-CM | POA: Diagnosis not present

## 2016-08-21 DIAGNOSIS — E873 Alkalosis: Secondary | ICD-10-CM | POA: Diagnosis present

## 2016-08-21 DIAGNOSIS — C8593 Non-Hodgkin lymphoma, unspecified, intra-abdominal lymph nodes: Secondary | ICD-10-CM | POA: Diagnosis not present

## 2016-08-21 DIAGNOSIS — E43 Unspecified severe protein-calorie malnutrition: Secondary | ICD-10-CM | POA: Diagnosis present

## 2016-08-21 DIAGNOSIS — Z79899 Other long term (current) drug therapy: Secondary | ICD-10-CM | POA: Diagnosis not present

## 2016-08-21 DIAGNOSIS — E861 Hypovolemia: Secondary | ICD-10-CM | POA: Diagnosis present

## 2016-08-21 DIAGNOSIS — I10 Essential (primary) hypertension: Secondary | ICD-10-CM | POA: Diagnosis present

## 2016-08-21 DIAGNOSIS — Z96642 Presence of left artificial hip joint: Secondary | ICD-10-CM | POA: Diagnosis present

## 2016-08-21 DIAGNOSIS — E871 Hypo-osmolality and hyponatremia: Principal | ICD-10-CM

## 2016-08-21 DIAGNOSIS — E778 Other disorders of glycoprotein metabolism: Secondary | ICD-10-CM | POA: Diagnosis present

## 2016-08-21 DIAGNOSIS — E860 Dehydration: Secondary | ICD-10-CM | POA: Diagnosis present

## 2016-08-21 DIAGNOSIS — R5383 Other fatigue: Secondary | ICD-10-CM | POA: Diagnosis not present

## 2016-08-21 DIAGNOSIS — R599 Enlarged lymph nodes, unspecified: Secondary | ICD-10-CM | POA: Diagnosis not present

## 2016-08-21 DIAGNOSIS — I369 Nonrheumatic tricuspid valve disorder, unspecified: Secondary | ICD-10-CM | POA: Diagnosis not present

## 2016-08-21 DIAGNOSIS — C8300 Small cell B-cell lymphoma, unspecified site: Secondary | ICD-10-CM | POA: Diagnosis not present

## 2016-08-21 DIAGNOSIS — C8338 Diffuse large B-cell lymphoma, lymph nodes of multiple sites: Secondary | ICD-10-CM

## 2016-08-21 DIAGNOSIS — C8580 Other specified types of non-Hodgkin lymphoma, unspecified site: Secondary | ICD-10-CM

## 2016-08-21 LAB — URINALYSIS, ROUTINE W REFLEX MICROSCOPIC
Bacteria, UA: NONE SEEN
Bilirubin Urine: NEGATIVE
GLUCOSE, UA: NEGATIVE mg/dL
Ketones, ur: 5 mg/dL — AB
Leukocytes, UA: NEGATIVE
Nitrite: NEGATIVE
PROTEIN: NEGATIVE mg/dL
SPECIFIC GRAVITY, URINE: 1.012 (ref 1.005–1.030)
Squamous Epithelial / LPF: NONE SEEN
pH: 6 (ref 5.0–8.0)

## 2016-08-21 LAB — CBC WITH DIFFERENTIAL/PLATELET
Basophils Absolute: 0 10*3/uL (ref 0.0–0.1)
Basophils Relative: 0 %
EOS ABS: 0 10*3/uL (ref 0.0–0.7)
EOS PCT: 0 %
HCT: 39.2 % (ref 36.0–46.0)
Hemoglobin: 12.9 g/dL (ref 12.0–15.0)
LYMPHS ABS: 0.6 10*3/uL — AB (ref 0.7–4.0)
LYMPHS PCT: 6 %
MCH: 28.3 pg (ref 26.0–34.0)
MCHC: 32.9 g/dL (ref 30.0–36.0)
MCV: 86 fL (ref 78.0–100.0)
MONO ABS: 0.8 10*3/uL (ref 0.1–1.0)
Monocytes Relative: 8 %
Neutro Abs: 8 10*3/uL — ABNORMAL HIGH (ref 1.7–7.7)
Neutrophils Relative %: 86 %
PLATELETS: 324 10*3/uL (ref 150–400)
RBC: 4.56 MIL/uL (ref 3.87–5.11)
RDW: 13.7 % (ref 11.5–15.5)
WBC: 9.4 10*3/uL (ref 4.0–10.5)

## 2016-08-21 LAB — COMPREHENSIVE METABOLIC PANEL
ALT: 31 U/L (ref 14–54)
ANION GAP: 12 (ref 5–15)
AST: 63 U/L — ABNORMAL HIGH (ref 15–41)
Albumin: 4.2 g/dL (ref 3.5–5.0)
Alkaline Phosphatase: 121 U/L (ref 38–126)
BUN: 8 mg/dL (ref 6–20)
CHLORIDE: 80 mmol/L — AB (ref 101–111)
CO2: 29 mmol/L (ref 22–32)
CREATININE: 0.64 mg/dL (ref 0.44–1.00)
Calcium: 10.2 mg/dL (ref 8.9–10.3)
Glucose, Bld: 103 mg/dL — ABNORMAL HIGH (ref 65–99)
Potassium: 4.1 mmol/L (ref 3.5–5.1)
SODIUM: 121 mmol/L — AB (ref 135–145)
Total Bilirubin: 1.6 mg/dL — ABNORMAL HIGH (ref 0.3–1.2)
Total Protein: 7.8 g/dL (ref 6.5–8.1)

## 2016-08-21 LAB — SODIUM, URINE, RANDOM: SODIUM UR: 60 mmol/L

## 2016-08-21 LAB — OSMOLALITY: Osmolality: 259 mOsm/kg — ABNORMAL LOW (ref 275–295)

## 2016-08-21 LAB — OSMOLALITY, URINE: Osmolality, Ur: 350 mOsm/kg (ref 300–900)

## 2016-08-21 LAB — LIPASE, BLOOD: LIPASE: 23 U/L (ref 11–51)

## 2016-08-21 MED ORDER — MORPHINE SULFATE (PF) 4 MG/ML IV SOLN
4.0000 mg | Freq: Once | INTRAVENOUS | Status: AC
  Administered 2016-08-21: 4 mg via INTRAVENOUS
  Filled 2016-08-21: qty 1

## 2016-08-21 MED ORDER — MELOXICAM 7.5 MG PO TABS
7.5000 mg | ORAL_TABLET | Freq: Every day | ORAL | Status: DC
  Administered 2016-08-22 – 2016-08-27 (×6): 7.5 mg via ORAL
  Filled 2016-08-21 (×6): qty 1

## 2016-08-21 MED ORDER — CALCIUM CARBONATE-VITAMIN D 500-200 MG-UNIT PO TABS
1.0000 | ORAL_TABLET | Freq: Two times a day (BID) | ORAL | Status: DC
  Administered 2016-08-21 – 2016-08-27 (×12): 1 via ORAL
  Filled 2016-08-21 (×12): qty 1

## 2016-08-21 MED ORDER — CALCIUM CITRATE-VITAMIN D 315-250 MG-UNIT PO TABS: ORAL_TABLET | Freq: Every day | ORAL | Status: DC

## 2016-08-21 MED ORDER — DOCUSATE SODIUM 100 MG PO CAPS
100.0000 mg | ORAL_CAPSULE | Freq: Two times a day (BID) | ORAL | Status: DC
  Administered 2016-08-21 – 2016-08-27 (×11): 100 mg via ORAL
  Filled 2016-08-21 (×12): qty 1

## 2016-08-21 MED ORDER — ONDANSETRON HCL 4 MG/2ML IJ SOLN: 4.0000 mg | Freq: Four times a day (QID) | INTRAMUSCULAR | Status: DC | PRN

## 2016-08-21 MED ORDER — LOSARTAN POTASSIUM 50 MG PO TABS
50.0000 mg | ORAL_TABLET | Freq: Every day | ORAL | Status: DC
  Administered 2016-08-22: 50 mg via ORAL
  Filled 2016-08-21: qty 1

## 2016-08-21 MED ORDER — SIMVASTATIN 10 MG PO TABS
10.0000 mg | ORAL_TABLET | Freq: Every day | ORAL | Status: DC
  Administered 2016-08-21 – 2016-08-26 (×6): 10 mg via ORAL
  Filled 2016-08-21 (×6): qty 1

## 2016-08-21 MED ORDER — DEXTROSE-NACL 5-0.9 % IV SOLN
INTRAVENOUS | Status: DC
  Administered 2016-08-21 – 2016-08-22 (×2): via INTRAVENOUS
  Administered 2016-08-22: 1000 mL via INTRAVENOUS
  Administered 2016-08-23 – 2016-08-26 (×5): via INTRAVENOUS

## 2016-08-21 MED ORDER — BOOST / RESOURCE BREEZE PO LIQD: 1.0000 | Freq: Two times a day (BID) | ORAL | Status: DC

## 2016-08-21 MED ORDER — SUCRALFATE 1 G PO TABS
1.0000 g | ORAL_TABLET | Freq: Four times a day (QID) | ORAL | Status: DC
  Administered 2016-08-21 – 2016-08-27 (×21): 1 g via ORAL
  Filled 2016-08-21 (×21): qty 1

## 2016-08-21 MED ORDER — MORPHINE SULFATE (PF) 4 MG/ML IV SOLN
1.0000 mg | INTRAVENOUS | Status: DC | PRN
  Administered 2016-08-27: 1 mg via INTRAVENOUS
  Filled 2016-08-21: qty 1

## 2016-08-21 MED ORDER — ONDANSETRON HCL 4 MG PO TABS: 4.0000 mg | ORAL_TABLET | Freq: Four times a day (QID) | ORAL | Status: DC | PRN

## 2016-08-21 MED ORDER — ENSURE ENLIVE PO LIQD
237.0000 mL | Freq: Two times a day (BID) | ORAL | Status: DC
  Administered 2016-08-22 – 2016-08-24 (×3): 237 mL via ORAL

## 2016-08-21 MED ORDER — POLYETHYLENE GLYCOL 3350 17 G PO PACK
17.0000 g | PACK | Freq: Every day | ORAL | Status: DC
  Administered 2016-08-22 – 2016-08-27 (×4): 17 g via ORAL
  Filled 2016-08-21 (×6): qty 1

## 2016-08-21 MED ORDER — ENOXAPARIN SODIUM 40 MG/0.4ML ~~LOC~~ SOLN
40.0000 mg | SUBCUTANEOUS | Status: DC
  Administered 2016-08-21 – 2016-08-26 (×6): 40 mg via SUBCUTANEOUS
  Filled 2016-08-21 (×6): qty 0.4

## 2016-08-21 MED ORDER — ACETAMINOPHEN 650 MG RE SUPP: 650.0000 mg | Freq: Four times a day (QID) | RECTAL | Status: DC | PRN

## 2016-08-21 MED ORDER — PANTOPRAZOLE SODIUM 20 MG PO TBEC
20.0000 mg | DELAYED_RELEASE_TABLET | Freq: Every day | ORAL | Status: DC
  Administered 2016-08-22 – 2016-08-27 (×6): 20 mg via ORAL
  Filled 2016-08-21 (×6): qty 1

## 2016-08-21 MED ORDER — SODIUM CHLORIDE 0.9 % IV BOLUS (SEPSIS)
500.0000 mL | Freq: Once | INTRAVENOUS | Status: AC
  Administered 2016-08-21: 500 mL via INTRAVENOUS

## 2016-08-21 MED ORDER — ACETAMINOPHEN 325 MG PO TABS: 650.0000 mg | ORAL_TABLET | Freq: Four times a day (QID) | ORAL | Status: DC | PRN

## 2016-08-21 MED ORDER — OXYCODONE-ACETAMINOPHEN 5-325 MG PO TABS
1.0000 | ORAL_TABLET | ORAL | Status: DC | PRN
  Administered 2016-08-21 – 2016-08-27 (×11): 1 via ORAL
  Filled 2016-08-21 (×11): qty 1

## 2016-08-21 NOTE — H&P (Addendum)
History and Physical    Christina Peterson QQI:[id number] DOB: [birth year] DOA: 08/21/2016  PCP: Julius Bowels, MD (Inactive)   Patient coming from: Home   Chief Complaint: Abdominal pain, nausea and decreased appetite.   HPI: Christina Peterson is a 76 y.o. female with medical history significant of newly diagnosed Hodgkin's lymphoma. For the last 7 days patient has been complaining of abdominal pain, lower abdomen, 8 and 9 out of 10 in intensity, constant, no radiation, worse with urination, improved with po analgesics, it has been associated with nausea and significant decreased appetite. She presented to the hospital due to persistent symptoms.  Recent hospitalization from April 28 to May 3, for abdominal pain and hyponatremia, retroperitoneal lymphadenopathy status post biopsy, results consistent with non-Hodgkin's lymphoma. She has not follow-up as an outpatient yet, appointment scheduled for the oncology clinic on 08/23/16.    ED Course: Found to be hyponatremic and weak, received 500 mL of normal saline intravenously and referred for admission.  Review of Systems: 1. General. No fevers, no chills, positive subjective weight loss 2. ENT no runny nose or sore throat 3. Pulmonary no shortness of breath, cough or hemoptysis 4. Cardiovascular, no angina, no claudication, no PND orthopnea 5. Gastrointestinal positive for nausea and abdominal pain as mentioned in history present illness, no vomiting or diarrhea. 6. Musculoskeletal no joint pain 7. Dermatology no rashes 8. Urology no dysuria or increased urinary frequency 9. Hematology no easy bruisability or frequent infections 10. Neurology no seizures or paresthesias  Past Medical History:  Diagnosis Date  . Arthritis   . Hypercholesteremia   . Hypertension    FOLLOWED BY DR Katherine Roan    Past Surgical History:  Procedure Laterality Date  . ABDOMINAL HYSTERECTOMY    . JOINT REPLACEMENT    . TOTAL HIP ARTHROPLASTY  2010   LEFT     . TOTAL HIP REVISION  05/02/2011   Procedure: TOTAL HIP REVISION;  Surgeon: Sharmon Revere, MD;  Location: [facility];  Service: Orthopedics;  Laterality: Left;  left total hip revision with bone grafting     reports that she has never smoked. She has never used smokeless tobacco. She reports that she does not drink alcohol or use drugs.  No Known Allergies  No family history on file.  No pertinent family history, for lymphoma.  Prior to Admission medications   Medication Sig Start Date End Date Taking? Authorizing Provider  Calcium Citrate-Vitamin D (CITRACAL MAXIMUM PO) Take 2 tablets by mouth daily.    Yes [provider]  docusate sodium (COLACE) 100 MG capsule Take 1 capsule (100 mg total) by mouth every 12 (twelve) hours. 08/15/16  Yes Barton Dubois, MD  losartan (COZAAR) 50 MG tablet Take 1 tablet (50 mg total) by mouth daily. 08/15/16  Yes Barton Dubois, MD  meloxicam (MOBIC) 7.5 MG tablet Take 7.5 mg by mouth daily.  03/04/16  Yes [provider]  oxyCODONE-acetaminophen (PERCOCET/ROXICET) 5-325 MG tablet Take 1 tablet by mouth every 4 (four) hours as needed for severe pain. 08/15/16  Yes Barton Dubois, MD  pantoprazole (PROTONIX) 20 MG tablet Take 1 tablet (20 mg total) by mouth daily. 07/19/16  Yes Lacretia Leigh, MD  polyethylene glycol [facility] / Floria Raveling) packet Take 17 g by mouth daily. 08/15/16  Yes Barton Dubois, MD  simvastatin (ZOCOR) 10 MG tablet Take 10 mg by mouth at bedtime.     Yes [provider]  sucralfate (CARAFATE) 1 g tablet Take 1 tablet (1 g total) by  mouth 4 (four) times daily. 07/19/16  Yes Lacretia Leigh, MD  feeding supplement (BOOST / RESOURCE BREEZE) LIQD Take 1 Container by mouth 2 (two) times daily between meals. Patient not taking: Reported on 08/21/2016 08/15/16   Barton Dubois, MD  feeding supplement, ENSURE ENLIVE, (ENSURE ENLIVE) LIQD Take 237 mLs by mouth 2 (two) times daily between meals. Patient not taking: Reported on 08/21/2016  08/15/16   Barton Dubois, MD    Physical Exam: Vitals:   08/21/16 1331 08/21/16 1546  BP: (!) 148/78 (!) 146/70  Pulse: 72 71  Resp: 18 20  Temp: 97.7 F (36.5 C) 98.2 F (36.8 C)  TempSrc: Oral Oral  SpO2: 100% 99%    Constitutional: deconditioned Vitals:   08/21/16 1331 08/21/16 1546  BP: (!) 148/78 (!) 146/70  Pulse: 72 71  Resp: 18 20  Temp: 97.7 F (36.5 C) 98.2 F (36.8 C)  TempSrc: Oral Oral  SpO2: 100% 99%   Eyes: PERRL, lids and conjunctivae mild pale, no icterus.  Nose and ears no deformities, head normocephalic.  ENMT: Mucous membranes are dry. Posterior pharynx clear of any exudate or lesions.Normal dentition.  Neck: normal, supple, no masses, no thyromegaly Respiratory: clear to auscultation bilaterally, no wheezing, no crackles. Normal respiratory effort. No accessory muscle use.  Cardiovascular: Regular rate and rhythm, no / rubs / gallops. No extremity edema. 2+ pedal pulses. No carotid bruits. Positive systolic murmur at the apex, radiated to the axilla, 3/6 in intensity.  Abdomen: no tenderness, no masses palpated. No hepatosplenomegaly. Bowel sounds positive.  Musculoskeletal: no clubbing / cyanosis. No joint deformity upper and lower extremities. Good ROM, no contractures. Normal muscle tone.  Skin: no rashes, lesions, ulcers. No induration Neurologic: CN 2-12 grossly intact. Sensation intact, DTR normal. Strength 5/5 in all 4.    Labs on Admission: I have personally reviewed following labs and imaging studies  CBC:  Recent Labs Lab 08/15/16 0444 08/21/16 1546  WBC 6.9 9.4  NEUTROABS  --  8.0*  HGB 10.9* 12.9  HCT 34.2* 39.2  MCV 89.5 86.0  PLT 261 793   Basic Metabolic Panel:  Recent Labs Lab 08/15/16 0444 08/21/16 1546  NA 134* 121*  K 3.9 4.1  CL 98* 80*  CO2 28 29  GLUCOSE 97 103*  BUN <5* 8  CREATININE 0.54 0.64  CALCIUM 9.0 10.2   GFR: Estimated Creatinine Clearance: 44.4 mL/min (by C-G formula based on SCr of 0.64  mg/dL). Liver Function Tests:  Recent Labs Lab 08/21/16 1546  AST 63*  ALT 31  ALKPHOS 121  BILITOT 1.6*  PROT 7.8  ALBUMIN 4.2    Recent Labs Lab 08/21/16 1546  LIPASE 23   No results for input(s): AMMONIA in the last 168 hours. Coagulation Profile: No results for input(s): INR, PROTIME in the last 168 hours. Cardiac Enzymes: No results for input(s): CKTOTAL, CKMB, CKMBINDEX, TROPONINI in the last 168 hours. BNP (last 3 results) No results for input(s): PROBNP in the last 8760 hours. HbA1C: No results for input(s): HGBA1C in the last 72 hours. CBG: No results for input(s): GLUCAP in the last 168 hours. Lipid Profile: No results for input(s): CHOL, HDL, LDLCALC, TRIG, CHOLHDL, LDLDIRECT in the last 72 hours. Thyroid Function Tests: No results for input(s): TSH, T4TOTAL, FREET4, T3FREE, THYROIDAB in the last 72 hours. Anemia Panel: No results for input(s): VITAMINB12, FOLATE, FERRITIN, TIBC, IRON, RETICCTPCT in the last 72 hours. Urine analysis:    Component Value Date/Time   COLORURINE STRAW (A) 08/11/2016  [place] 08/11/2016 0137   LABSPEC 1.006 08/11/2016 0137   PHURINE 5.0 08/11/2016 0137   GLUCOSEU NEGATIVE 08/11/2016 0137   HGBUR MODERATE (A) 08/11/2016 0137   BILIRUBINUR NEGATIVE 08/11/2016 0137   KETONESUR 5 (A) 08/11/2016 0137   PROTEINUR NEGATIVE 08/11/2016 0137   UROBILINOGEN 0.2 04/30/2011 1145   NITRITE NEGATIVE 08/11/2016 [place] 08/11/2016 0137    Radiological Exams on Admission: No results found.  EKG: Independently reviewed. NA  Assessment/Plan Active Problems:   Hyponatremia   This is a 76 year old female who was recently diagnosed with non-Hodgkin's lymphoma, presents with abdominal pain, nausea, decreased appetite and weight loss. No fevers, no diarrhea or vomiting. On initial physical examination temperature 97.7, blood pressure 148/78, heart rate 72, respiratory rate 18, oxygen saturation 100% on  room air, oral mucosa is dry, her lungs are clear to auscultation, heart S1-S2 present rhythmic, her abdomen is soft, no masses palpable, no peritoneal signs, no lower extremity edema. Sodium 121, potassium 4.1, chloride 80, bicarbonate 29, glucose 103, BUN 8, creatinine 0.64, calcium 10.2, lipase 23, AST 63, ALT 31, white count 9.4, hemoglobin 12.9, hematocrit 39.2, platelets 324. Urine analysis negative for infection, specific gravity 1.012  The patient will be admitted to hospital with the working diagnosis of hypovolemic hypo-osmolar hyponatremia complicated with metabolic alkalosis in the setting of newly diagnosed non-Hodgkin's lymphoma.  1. Hypovolemic, hypoosmolar hyponatremia. Will continue hydration with IV isotonic saline at 75 mL per hour, follow-up chemistry in the morning. Being sodium greater than 120, risk of cerebral pontine myelolysis is low. The target sodiom in the next 24 hours is 128 meq, patient's calculated total body water is 24 L, she will need 192 meq sodium, that will be equivalent to 1.2 L of normal saline. Will calculate urine electrolytes, (sodium, potassium and chloride), urine and serum osmolality as part of a workup for hyponatremia.   2. Metabolic alkalosis. Related to dehydration, poor oral intake, continue hydration with diastolics feeling at 75 mL per hour, that will be sum to 1800 mls, to compensate for insensible losses.  3. Hypertension. Will continue blood pressure control with losartan 50 mg daily, continue blood pressure monitoring.  4. Non-Hodgkin's lymphoma. Continue pain control with oxycodone, will add IV morphine for severe pain, will contact patient's oncologist in the morning for further instructions for follow-up. Continue feeding supplements.  5. Dyslipidemia. Continue simvastatin.   DVT prophylaxis: enoxaparin  Code Status:  Full  Family Communication: I spoke with patient's family at the bedside and all questions were addressed.  Disposition  Plan: Home  Consults called:  Admission status: Inpatient    Mauricio Gerome Apley MD [facility] Pager [phone]  If 7PM-7AM, please contact night-coverage [email or web address] Password [email or web address]  08/21/2016, 5:01 PM

## 2016-08-21 NOTE — ED Triage Notes (Signed)
Pt from home with family with abdominal pain and back pain x over 2 weeks. Pt was recently diagnosed with tumors. Pt's family states they recently did a biopsy, but do not have the results yet. Pt  States pain is worse when she eats.

## 2016-08-21 NOTE — ED Notes (Signed)
Gave report to Jocelyn Lamer, RN for assigned room 1328.

## 2016-08-21 NOTE — ED Provider Notes (Addendum)
[facility] DEPT Provider Note   CSN: [id number] Arrival date & time: 08/21/16  1310     History   Chief Complaint Chief Complaint  Patient presents with  . Abdominal Pain    HPI Christina Peterson is a 76 y.o. female.  HPI  Periumbilical pain spreading to left and right side, has been coming and going, noticed it worse with eating. Pain has been continuous since last admission to the hospital.  Did not get better.  With urinating pain is worse in abdomen, worse with straining and bearing down. No dysuria.  Nausea in the car on the way here. No vomiting. No diarrhea. Constipation with last bowel movement Sunday. Not passing flatus for one week.  Is taking percocet and does not feel like it is helping.     Past Medical History:  Diagnosis Date  . Arthritis   . Hypercholesteremia   . Hypertension    FOLLOWED BY DR [facility]    Patient Active Problem List   Diagnosis Date Noted  . Severe protein-calorie malnutrition ([place])   . Hyponatremia 08/11/2016  . Normocytic normochromic anemia 08/11/2016  . Essential hypertension 08/11/2016  . Constipation 08/11/2016  . Hyperlipidemia 08/11/2016  . Abdominal pain 08/11/2016  . Renal mass, right 08/11/2016  . Adenopathy 08/11/2016  . Other intra-abdominal and pelvic swelling, mass and lump 08/11/2016  . Pain in left hip 06/05/2016    Past Surgical History:  Procedure Laterality Date  . ABDOMINAL HYSTERECTOMY    . JOINT REPLACEMENT    . 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 TOTAL HIP REVISION  05/02/2011   Procedure: TOTAL HIP REVISION;  Surgeon: Sharmon Revere, MD;  Location: [facility];  Service: Orthopedics;  Laterality: Left;  left total hip revision with bone grafting    OB History    No data available       Home Medications    Prior to Admission medications   Medication Sig Start Date End Date Taking? Authorizing Provider  Calcium Citrate-Vitamin D (CITRACAL MAXIMUM PO) Take 2 tablets by mouth daily.    Yes  [provider]  docusate sodium (COLACE) 100 MG capsule Take 1 capsule (100 mg total) by mouth every 12 (twelve) hours. 08/15/16  Yes Barton Dubois, MD  losartan (COZAAR) 50 MG tablet Take 1 tablet (50 mg total) by mouth daily. 08/15/16  Yes Barton Dubois, MD  meloxicam (MOBIC) 7.5 MG tablet Take 7.5 mg by mouth daily.  03/04/16  Yes [provider]  oxyCODONE-acetaminophen (PERCOCET/ROXICET) 5-325 MG tablet Take 1 tablet by mouth every 4 (four) hours as needed for severe pain. 08/15/16  Yes Barton Dubois, MD  pantoprazole (PROTONIX) 20 MG tablet Take 1 tablet (20 mg total) by mouth daily. 07/19/16  Yes Lacretia Leigh, MD  polyethylene glycol [facility] / Floria Raveling) packet Take 17 g by mouth daily. 08/15/16  Yes Barton Dubois, MD  simvastatin (ZOCOR) 10 MG tablet Take 10 mg by mouth at bedtime.     Yes [provider]  sucralfate (CARAFATE) 1 g tablet Take 1 tablet (1 g total) by mouth 4 (four) times daily. 07/19/16  Yes Lacretia Leigh, MD  feeding supplement (BOOST / RESOURCE BREEZE) LIQD Take 1 Container by mouth 2 (two) times daily between meals. Patient not taking: Reported on 08/21/2016 08/15/16   Barton Dubois, MD  feeding supplement, ENSURE ENLIVE, (ENSURE ENLIVE) LIQD Take 237 mLs by mouth 2 (two) times daily between meals. Patient not taking: Reported on 08/21/2016 08/15/16  Barton Dubois, MD    Family History No family history on file.  Social History Social History  Substance Use Topics  . Smoking status: Never Smoker  . Smokeless tobacco: Never Used  . Alcohol use No     Allergies   Patient has no known allergies.   Review of Systems Review of Systems  Constitutional: Positive for appetite change and fatigue. Negative for fever.  HENT: Negative for sore throat.   Eyes: Negative for visual disturbance.  Respiratory: Negative for cough and shortness of breath.   Cardiovascular: Negative for chest pain.  Gastrointestinal: Positive for abdominal pain,  constipation and nausea (just on way here).  Genitourinary: Negative for difficulty urinating and dysuria.  Musculoskeletal: Negative for back pain and neck pain.  Skin: Negative for rash.  Neurological: Negative for syncope and headaches.     Physical Exam Updated Vital Signs BP 122/77 (BP Location: Right Arm)   Pulse 66   Temp 98.2 F (36.8 C) (Oral)   Resp 20   SpO2 97%   Physical Exam  Constitutional: She is oriented to person, place, and time. She appears well-developed and well-nourished. No distress.  HENT:  Head: Normocephalic and atraumatic.  Eyes: Conjunctivae and EOM are normal.  Neck: Normal range of motion.  Cardiovascular: Normal rate, regular rhythm and intact distal pulses.  Exam reveals no gallop and no friction rub.   Murmur heard. Pulmonary/Chest: Effort normal and breath sounds normal. No respiratory distress. She has no wheezes. She has no rales.  Abdominal: Soft. She exhibits no distension. There is no tenderness. There is no guarding.  Musculoskeletal: She exhibits no edema or tenderness.  Neurological: She is alert and oriented to person, place, and time.  Skin: Skin is warm and dry. No rash noted. She is not diaphoretic. No erythema.  Nursing note and vitals reviewed.    ED Treatments / Results  Labs (all labs ordered are listed, but only abnormal results are displayed) Labs Reviewed  CBC WITH DIFFERENTIAL/PLATELET - Abnormal; Notable for the following:       Result Value   Neutro Abs 8.0 (*)    Lymphs Abs 0.6 (*)    All other components within normal limits  COMPREHENSIVE METABOLIC PANEL - Abnormal; Notable for the following:    Sodium 121 (*)    Chloride 80 (*)    Glucose, Bld 103 (*)    AST 63 (*)    Total Bilirubin 1.6 (*)    All other components within normal limits  URINALYSIS, ROUTINE W REFLEX MICROSCOPIC - Abnormal; Notable for the following:    Hgb urine dipstick MODERATE (*)    Ketones, ur 5 (*)    All other components within  normal limits  LIPASE, BLOOD  SODIUM, URINE, RANDOM  OSMOLALITY, URINE    EKG  EKG Interpretation None       Radiology No results found.  Procedures Procedures (including critical care time)  Medications Ordered in ED Medications  morphine 4 MG/ML injection 4 mg (4 mg Intravenous Given 08/21/16 1544)  sodium chloride 0.9 % bolus 500 mL (500 mLs Intravenous New Bag/Given 08/21/16 1734)     Initial Impression / Assessment and Plan / ED Course  I have reviewed the triage vital signs and the nursing notes.  Pertinent labs & imaging results that were available during my care of the patient were reviewed by me and considered in my medical decision making (see chart for details).     76 year old female with a history of  arthritis, hypertension, hyperlipidemia, recent diagnosis of abdominal mass with CT and MRI concerning for renal cell carcinoma and lymphoma, and recent biopsy showing non-Hodgkin's lymphoma, recent admission for hyponatremia, who presents with concern for continuing periumbilical abdominal pain, poor appetite per family.  Patient's abdominal pain is unchanged from her prior admission, CT scan and urinalysis. Her exam is benign, and have low suspicion for acute intra-abdominal pathology or obstruction. She has no vomiting. Suspect her abdominal pain is likely continuing pain secondary to cancer, constipation as contributor.  Labs checked given prior hx of hyponatremia and poor appetite. Labs show sodium of 121 from most recent of 134.  Given 500cc NS. ?hypovolemic/dehydration however ordered urine studies to eval for other etiologies.  Consulted hospitalist for admission.   Final Clinical Impressions(s) / ED Diagnoses   Final diagnoses:  Generalized abdominal pain  B-cell lymphoma, unspecified B-cell lymphoma type, unspecified body region [facility])  Hyponatremia    New Prescriptions New Prescriptions   No medications on file     Gareth Morgan, MD 08/21/16 1746     Gareth Morgan, MD 08/21/16 2031

## 2016-08-22 ENCOUNTER — Other Ambulatory Visit: Payer: Self-pay | Admitting: Hematology

## 2016-08-22 ENCOUNTER — Encounter (HOSPITAL_COMMUNITY): Payer: Self-pay | Admitting: *Deleted

## 2016-08-22 ENCOUNTER — Inpatient Hospital Stay (HOSPITAL_COMMUNITY): Payer: Medicare Other

## 2016-08-22 ENCOUNTER — Other Ambulatory Visit (HOSPITAL_COMMUNITY): Payer: Medicare Other

## 2016-08-22 DIAGNOSIS — C8510 Unspecified B-cell lymphoma, unspecified site: Secondary | ICD-10-CM | POA: Diagnosis present

## 2016-08-22 DIAGNOSIS — C8338 Diffuse large B-cell lymphoma, lymph nodes of multiple sites: Secondary | ICD-10-CM | POA: Insufficient documentation

## 2016-08-22 DIAGNOSIS — R5383 Other fatigue: Secondary | ICD-10-CM

## 2016-08-22 DIAGNOSIS — N289 Disorder of kidney and ureter, unspecified: Secondary | ICD-10-CM

## 2016-08-22 DIAGNOSIS — R627 Adult failure to thrive: Secondary | ICD-10-CM

## 2016-08-22 DIAGNOSIS — C8300 Small cell B-cell lymphoma, unspecified site: Secondary | ICD-10-CM

## 2016-08-22 DIAGNOSIS — C649 Malignant neoplasm of unspecified kidney, except renal pelvis: Secondary | ICD-10-CM

## 2016-08-22 DIAGNOSIS — R599 Enlarged lymph nodes, unspecified: Secondary | ICD-10-CM

## 2016-08-22 HISTORY — DX: Diffuse large b-cell lymphoma, lymph nodes of multiple sites: C83.38

## 2016-08-22 LAB — COMPREHENSIVE METABOLIC PANEL
ALBUMIN: 3.4 g/dL — AB (ref 3.5–5.0)
ALK PHOS: 102 U/L (ref 38–126)
ALT: 23 U/L (ref 14–54)
AST: 43 U/L — ABNORMAL HIGH (ref 15–41)
Anion gap: 8 (ref 5–15)
BILIRUBIN TOTAL: 1.3 mg/dL — AB (ref 0.3–1.2)
BUN: 8 mg/dL (ref 6–20)
CALCIUM: 9.4 mg/dL (ref 8.9–10.3)
CO2: 30 mmol/L (ref 22–32)
CREATININE: 0.47 mg/dL (ref 0.44–1.00)
Chloride: 89 mmol/L — ABNORMAL LOW (ref 101–111)
GFR calc Af Amer: >60 mL/min (ref >60)
GFR calc non Af Amer: >60 mL/min (ref >60)
GLUCOSE: 99 mg/dL (ref 65–99)
Potassium: 3.5 mmol/L (ref 3.5–5.1)
Sodium: 127 mmol/L — ABNORMAL LOW (ref 135–145)
Total Protein: 6.1 g/dL — ABNORMAL LOW (ref 6.5–8.1)

## 2016-08-22 LAB — NA AND K (SODIUM & POTASSIUM), RAND UR
Potassium Urine: 50 mmol/L
Sodium, Ur: 63 mmol/L

## 2016-08-22 LAB — CHLORIDE, URINE, RANDOM: Chloride Urine: 59 mmol/L

## 2016-08-22 LAB — LACTATE DEHYDROGENASE: LDH: 321 U/L — ABNORMAL HIGH (ref 98–192)

## 2016-08-22 LAB — OSMOLALITY, URINE: Osmolality, Ur: 354 mOsm/kg (ref 300–900)

## 2016-08-22 LAB — CORTISOL: CORTISOL PLASMA: 15.2 ug/dL

## 2016-08-22 MED ORDER — PREDNISONE 50 MG PO TABS
50.0000 mg | ORAL_TABLET | Freq: Every day | ORAL | Status: DC
  Administered 2016-08-22 – 2016-08-27 (×6): 50 mg via ORAL
  Filled 2016-08-22 (×6): qty 1

## 2016-08-22 MED ORDER — LOSARTAN POTASSIUM 50 MG PO TABS
25.0000 mg | ORAL_TABLET | Freq: Every day | ORAL | Status: DC
  Administered 2016-08-23 – 2016-08-27 (×5): 25 mg via ORAL
  Filled 2016-08-22 (×5): qty 1

## 2016-08-22 MED ORDER — GADOBENATE DIMEGLUMINE 529 MG/ML IV SOLN
10.0000 mL | Freq: Once | INTRAVENOUS | Status: AC | PRN
  Administered 2016-08-22: 10 mL via INTRAVENOUS

## 2016-08-22 MED ORDER — ALLOPURINOL 100 MG PO TABS
100.0000 mg | ORAL_TABLET | Freq: Two times a day (BID) | ORAL | Status: DC
  Administered 2016-08-22 – 2016-08-27 (×10): 100 mg via ORAL
  Filled 2016-08-22 (×11): qty 1

## 2016-08-22 MED ORDER — BOOST / RESOURCE BREEZE PO LIQD
1.0000 | Freq: Three times a day (TID) | ORAL | Status: DC
  Administered 2016-08-24 – 2016-08-27 (×4): 1 via ORAL

## 2016-08-22 NOTE — Progress Notes (Signed)
Initial Nutrition Assessment  DOCUMENTATION CODES:   Severe malnutrition in context of acute illness/injury  INTERVENTION:   -Continue Ensure Enlive po BID, each supplement provides 350 kcal and 20 grams of protein -Continue Boost Breeze po TID, each supplement provides 250 kcal and 9 grams of protein -Encourage PO intake -RD to continue to monitor  NUTRITION DIAGNOSIS:   Malnutrition related to acute illness as evidenced by percent weight loss, energy intake < or equal to 50% for > or equal to 1 month.  GOAL:   Patient will meet greater than or equal to 90% of their needs  MONITOR:   PO intake, Supplement acceptance, Labs, Weight trends, I & O's  REASON FOR ASSESSMENT:   Malnutrition Screening Tool    ASSESSMENT:   76 y.o. female with medical history significant of newly diagnosed Hodgkin's lymphoma. For the last 7 days patient has been complaining of abdominal pain, lower abdomen, 8 and 9 out of 10 in intensity, constant, no radiation, worse with urination, improved with po analgesics, it has been associated with nausea and significant decreased appetite. She presented to the hospital due to persistent symptoms.  Patient in restroom during RD visit. Pt's family at bedside and states the patient will be in there a while. Per family, pt has not been eating at home for about 7 days now. She was eating during a previous admission, discharged on 5/3. Pt developed nausea and did not eat much because of this symptom. Pt has an open Ensure supplement at bedside but it is full.   Pt with no new weight recorded for this admission. Based on weight from 4/29, pt has lost 7 lb since 2/21 (6% wt loss x 2.5 months, significant for time frame). Will attempt NFPE at follow-up. Based on results from NFPE on 4/30, pt suspected to have at least mild-moderate fat and muscle depletion.   Medications: OSCAL w/ Vitamin D tablet BID, Colace capsule every 12 hours, Protonix tablet daily, Miralax  packet daily, D5 -.9% NaCl infusion at 75 ml/hr -provides 306 kcal Labs reviewed: Low Na  Diet Order:  Diet regular Room service appropriate? Yes; Fluid consistency: Thin  Skin:  Reviewed, no issues  Last BM:  5/6  Height:   Ht Readings from Last 1 Encounters:  08/11/16 5\' 1"  (1.549 m)    Weight:   Wt Readings from Last 1 Encounters:  08/11/16 108 lb (49 kg)    Ideal Body Weight:  47.7 kg  BMI:  20 kg/m^2  Estimated Nutritional Needs:   Kcal:  1300-1500  Protein:  60-70g  Fluid:  1.5L/day  EDUCATION NEEDS:   No education needs identified at this time  Clayton Bibles, MS, RD, LDN Pager: [phone] After Hours Pager: [phone]

## 2016-08-22 NOTE — Progress Notes (Signed)
START ON PATHWAY REGIMEN - Lymphoma and CLL     A cycle is every 21 days:     Rituximab      Cyclophosphamide      Doxorubicin      Vincristine      Prednisone   **Always confirm dose/schedule in your pharmacy ordering system**    Patient Characteristics: Diffuse Large B Cell, First Line, Stage III and IV Disease Type: Not Applicable Disease Type: Diffuse Large B Cell Line of therapy: First Line [place] Stage: III  Intent of Therapy: Curative Intent, Discussed with Patient

## 2016-08-22 NOTE — Progress Notes (Signed)
PROGRESS NOTE    Christina Peterson  HKV:[id number] DOB: 1941/03/26 DOA: 08/21/2016 PCP: Julius Bowels, MD (Inactive)    Brief Narrative:  This is a 76 year old female who was recently diagnosed with non-Hodgkin's lymphoma, presents with abdominal pain, nausea, decreased appetite and weight loss. No fevers, no diarrhea or vomiting. On initial physical examination temperature 97.7, blood pressure 148/78, heart rate 72, respiratory rate 18, oxygen saturation 100% on room air, oral mucosa is dry, her lungs are clear to auscultation, heart S1-S2 present rhythmic, her abdomen is soft, no masses palpable, no peritoneal signs, no lower extremity edema. Sodium 121, potassium 4.1, chloride 80, bicarbonate 29, glucose 103, BUN 8, creatinine 0.64, calcium 10.2, lipase 23, AST 63, ALT 31, white count 9.4, hemoglobin 12.9, hematocrit 39.2, platelets 324. Urine analysis negative for infection, specific gravity 1.012  The patient was admitted to hospital with the working diagnosis of hypovolemic hypo-osmolar hyponatremia complicated with metabolic alkalosis in the setting of newly diagnosed non-Hodgkin's lymphoma. Responding to IV fluids, follow on brain mri and oncology recommendations.    Assessment & Plan:   Active Problems:   Hyponatremia   1. Hypovolemic, hypo-osmolar hyponatremia. Serum Na at target this am, 127, will continue hydration with normal saline at 75 ml per hour, follow on renal panel in am. Serum osmolality on admission 259 with urine Na at 60 and chloride at 59, with high urine osmolality at 564.   2. Metabolic alkalosis. Persistent metabolic alkalosis, with serum bicarbonate 30, suspected contraction alkalosis due to dehydration, will continue saline at 75 ml and follow on renal panel in am.   3. Hypertension.  Blood pressure systolic 332 to 951,  will continue to hold on losartan, but will decrease the dose to avoid hypotension.   4. Non-Hodgkin's lymphoma. Newly diagnosed, will  continue to follow on oncology recommendations, echocardiogram (murmur, likely mitral regurgitation) and brain MRI.   5. Dyslipidemia. Continue simvastatin per home regimen.    DVT prophylaxis: enoxaparin  Code Status: full  Family Communication: I spoke with patient's family at the bedside and all questions were addressed.  Disposition Plan: home    Consultants:   Oncology   Procedures:     Antimicrobials:       Subjective: Patient feeling better, improved nausea and vomiting, able to tolerate po diet. Pain at the lower back present, moderate in intensity, improved with pain medications. Positive bowel movement.   Objective: Vitals:   08/21/16 1734 08/21/16 1848 08/21/16 2116 08/22/16 0515  BP: 122/77 (!) 126/59 124/64 136/64  Pulse: 66 63 64 70  Resp: 20 18 18 16   Temp:  98.1 F (36.7 C) 98.4 F (36.9 C) 98.2 F (36.8 C)  TempSrc:  Oral Oral Oral  SpO2: 97% 100% 99% 98%   No intake or output data in the 24 hours ending 08/22/16 1241 There were no vitals filed for this visit.  Examination:  General exam: deconditioned E ENT. Mild pallor, no icterus, oral mucosa moist.  Respiratory system: Clear to auscultation. Respiratory effort normal. No wheezing, rales or rhonchi.  Cardiovascular system: S1 & S2 heard, RRR. No JVD, positive systolic murmur III/VI at the apex and radiated to the axilla, rubs, gallops or clicks. No pedal edema. Gastrointestinal system: Abdomen is nondistended, soft and nontender. No organomegaly or masses felt. Normal bowel sounds heard. Central nervous system: Alert and oriented. No focal neurological deficits. Extremities: Symmetric 5 x 5 power. Skin: No rashes, lesions or ulcers     Data Reviewed: I have personally reviewed  following labs and imaging studies  CBC:  Recent Labs Lab 08/21/16 1546  WBC 9.4  NEUTROABS 8.0*  HGB 12.9  HCT 39.2  MCV 86.0  PLT 824   Basic Metabolic Panel:  Recent Labs Lab 08/21/16 1546  08/22/16 0347  NA 121* 127*  K 4.1 3.5  CL 80* 89*  CO2 29 30  GLUCOSE 103* 99  BUN 8 8  CREATININE 0.64 0.47  CALCIUM 10.2 9.4   GFR: Estimated Creatinine Clearance: 44.4 mL/min (by C-G formula based on SCr of 0.47 mg/dL). Liver Function Tests:  Recent Labs Lab 08/21/16 1546 08/22/16 0347  AST 63* 43*  ALT 31 23  ALKPHOS 121 102  BILITOT 1.6* 1.3*  PROT 7.8 6.1*  ALBUMIN 4.2 3.4*    Recent Labs Lab 08/21/16 1546  LIPASE 23   No results for input(s): AMMONIA in the last 168 hours. Coagulation Profile: No results for input(s): INR, PROTIME in the last 168 hours. Cardiac Enzymes: No results for input(s): CKTOTAL, CKMB, CKMBINDEX, TROPONINI in the last 168 hours. BNP (last 3 results) No results for input(s): PROBNP in the last 8760 hours. HbA1C: No results for input(s): HGBA1C in the last 72 hours. CBG: No results for input(s): GLUCAP in the last 168 hours. Lipid Profile: No results for input(s): CHOL, HDL, LDLCALC, TRIG, CHOLHDL, LDLDIRECT in the last 72 hours. Thyroid Function Tests: No results for input(s): TSH, T4TOTAL, FREET4, T3FREE, THYROIDAB in the last 72 hours. Anemia Panel: No results for input(s): VITAMINB12, FOLATE, FERRITIN, TIBC, IRON, RETICCTPCT in the last 72 hours. Sepsis Labs: No results for input(s): PROCALCITON, LATICACIDVEN in the last 168 hours.  No results found for this or any previous visit (from the past 240 hour(s)).       Radiology Studies: No results found.      Scheduled Meds: . calcium-vitamin D  1 tablet Oral BID  . docusate sodium  100 mg Oral Q12H  . enoxaparin (LOVENOX) injection  40 mg Subcutaneous Q24H  . feeding supplement  1 Container Oral BID BM  . feeding supplement (ENSURE ENLIVE)  237 mL Oral BID BM  . losartan  50 mg Oral Daily  . meloxicam  7.5 mg Oral Daily  . pantoprazole  20 mg Oral Daily  . polyethylene glycol  17 g Oral Daily  . simvastatin  10 mg Oral QHS  . sucralfate  1 g Oral QID    Continuous Infusions: . dextrose 5 % and 0.9% NaCl 1,000 mL (08/22/16 0821)     LOS: 1 day       Tiffinie Caillier Gerome Apley, MD [facility] Pager [phone]  If 7PM-7AM, please contact night-coverage [email or web address] Password [email or web address] 08/22/2016, 12:41 PM

## 2016-08-22 NOTE — Consult Note (Addendum)
[name] Kitchen    HEMATOLOGY/ONCOLOGY CONSULTATION NOTE  Date of Service: 08/22/2016  Patient Care Team: Julius Bowels, MD (Inactive) as PCP - General (Professional Counselor)  CHIEF COMPLAINTS/PURPOSE OF CONSULTATION:   Newly diagnosed Large B cell lymphoma  HISTORY OF PRESENTING ILLNESS:   Christina Peterson is a wonderful 76 y.o. female who has been referred to [place] by Dr Cathlean Sauer, Jimmy Picket,*   for evaluation and management of newly diagnosed Large B cell lymphoma.  Patient has a history of hypertension, dyslipidemia, arthritis but notes that she lives independently in a senior housing facility and does all her ADLs herself.  She notes that she has been having increasing abdominal pain and mid back pain for about 3-4 weeks and has had some increased fatigue over the last couple of months. She notes that she has lost about 7-8 pounds over the last 2-3 weeks. She was recently admitted to the hospital from 08/10/2016-08/15/2016 with abdominal pain and poor oral intake. She was noted to be dehydrated with hyponatremia and CT scan of the abdomen on 08/12/2016 showed lymphadenopathy within the chest and abdomen concerning for lymphoma. She also was noted to have a right renal mass in the lower pole of the right kidney measuring 3.6 cm concerning for renal cell carcinoma.  Patient had a CT-guided biopsy of her retroperitoneal lymph node on 08/14/2016 which shows large B-cell lymphoma. She was to be seen in the oncology clinic but got readmitted on 08/21/2016 with worsening abdominal pain nausea and decreased appetite.  She was noted to be significantly hyponatremic again and is currently on IV fluids. She notes her abdominal pain is better controlled with pain medications she is receiving. No fevers or chills. I was consulted to help with further evaluation and management of her newly diagnosed large B-cell lymphoma.  Patient notes abdominal and back pain and about an 8-10 pound weight loss in the last few  weeks and inability to keep much food down due to pain and anorexia. She has been living by herself and demonstrated failure to thrive.  We discussed the diagnosis, natural history, prognosis and treatment options in detail including additional workup including a PET scan, need for a Port-A-Cath and different chemotherapy options possible adverse effects and limitations 2 doses of chemotherapy that can be used as a result of her age. Patient understands and is able to repeat back the understanding of these elements of care and would like to proceed with the most appropriate treatment.   MEDICAL HISTORY:  Past Medical History:  Diagnosis Date  . Arthritis   . Hypercholesteremia   . Hypertension    FOLLOWED BY DR Katherine Roan    SURGICAL HISTORY: Past Surgical History:  Procedure Laterality Date  . ABDOMINAL HYSTERECTOMY    . JOINT REPLACEMENT    . 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 TOTAL HIP REVISION  05/02/2011   Procedure: TOTAL HIP REVISION;  Surgeon: Sharmon Revere, MD;  Location: [facility];  Service: Orthopedics;  Laterality: Left;  left total hip revision with bone grafting    SOCIAL HISTORY: Social History   Social History  . Marital status: Legally Separated    Spouse name: N/A  . Number of children: N/A  . Years of education: N/A   Occupational History  . Not on file.   Social History Main Topics  . Smoking status: Never Smoker  . Smokeless tobacco: Never Used  . Alcohol use No  . Drug use: No  . Sexual activity:  Not on file   Other Topics Concern  . Not on file   Social History Narrative  . No narrative on file    FAMILY HISTORY: No family history on file.  ALLERGIES:  has No Known Allergies.  MEDICATIONS:  Current Facility-Administered Medications  Medication Dose Route Frequency Provider Last Rate Last Dose  . acetaminophen (TYLENOL) tablet 650 mg  650 mg Oral Q6H PRN Arrien, Jimmy Picket, MD       Or  . acetaminophen (TYLENOL) suppository  650 mg  650 mg Rectal Q6H PRN Arrien, Jimmy Picket, MD      . calcium-vitamin D (OSCAL WITH D) 500-200 MG-UNIT per tablet 1 tablet  1 tablet Oral BID Arrien, Jimmy Picket, MD   1 tablet at 08/21/16 2105  . dextrose 5 %-0.9 % sodium chloride infusion   Intravenous Continuous Tawni Millers, MD 75 mL/hr at 08/22/16 0821 1,000 mL at 08/22/16 0821  . docusate sodium (COLACE) capsule 100 mg  100 mg Oral Q12H Arrien, Jimmy Picket, MD   100 mg at 08/21/16 2105  . enoxaparin (LOVENOX) injection 40 mg  40 mg Subcutaneous Q24H Tawni Millers, MD   40 mg at 08/21/16 2105  . feeding supplement (BOOST / RESOURCE BREEZE) liquid 1 Container  1 Container Oral BID BM Arrien, Jimmy Picket, MD      . feeding supplement (ENSURE ENLIVE) (ENSURE ENLIVE) liquid 237 mL  237 mL Oral BID BM Arrien, Jimmy Picket, MD      . losartan (COZAAR) tablet 50 mg  50 mg Oral Daily Arrien, Jimmy Picket, MD      . meloxicam [facility]) tablet 7.5 mg  7.5 mg Oral Daily Arrien, Jimmy Picket, MD      . morphine 4 MG/ML injection 1 mg  1 mg Intravenous Q4H PRN Arrien, Jimmy Picket, MD      . ondansetron [facility]) tablet 4 mg  4 mg Oral Q6H PRN Arrien, Jimmy Picket, MD       Or  . ondansetron [facility]) injection 4 mg  4 mg Intravenous Q6H PRN Arrien, Jimmy Picket, MD      . oxyCODONE-acetaminophen (PERCOCET/ROXICET) 5-325 MG per tablet 1 tablet  1 tablet Oral Q4H PRN Arrien, Jimmy Picket, MD   1 tablet at 08/22/16 0357  . pantoprazole (PROTONIX) EC tablet 20 mg  20 mg Oral Daily Arrien, Jimmy Picket, MD      . polyethylene glycol [facility]) packet 17 g  17 g Oral Daily Arrien, Jimmy Picket, MD      . simvastatin (ZOCOR) tablet 10 mg  10 mg Oral QHS Tawni Millers, MD   10 mg at 08/21/16 2105  . sucralfate (CARAFATE) tablet 1 g  1 g Oral QID Arrien, Jimmy Picket, MD   1 g at 08/21/16 2105    REVIEW OF SYSTEMS:    10 Point review of Systems was done is negative except  as noted above.  PHYSICAL EXAMINATION: ECOG PERFORMANCE STATUS:2-3  . Vitals:   08/21/16 2116 08/22/16 0515  BP: 124/64 136/64  Pulse: 64 70  Resp: 18 16  Temp: 98.4 F (36.9 C) 98.2 F (36.8 C)   There were no vitals filed for this visit. .There is no height or weight on file to calculate BMI.  GENERAL:alert, in no acute distress and comfortable SKIN: no acute rashes, no significant lesions EYES: conjunctiva are pink and non-injected, sclera anicteric OROPHARYNX: MMM, no exudates, no oropharyngeal erythema or ulceration NECK: supple, no JVD LYMPH:  b/l  palpable supraclavicular LN's, no palpable cervical LNadenopathy. LUNGS: clear to auscultation b/l with normal respiratory effort HEART: regular rate & rhythm, 3 x 6 systolic murmur over the sternum and aortic area ? Aortic stenosis ABDOMEN:  normoactive bowel sounds , non tender, not distended. Extremity: no pedal edema PSYCH: alert & oriented x 3 with fluent speech NEURO: no focal motor/sensory deficits  LABORATORY DATA:  I have reviewed the data as listed  . CBC Latest Ref Rng & Units 08/21/2016 08/15/2016 08/14/2016  WBC 4.0 - 10.5 K/uL 9.4 6.9 6.4  Hemoglobin 12.0 - 15.0 g/dL 12.9 10.9(L) 10.0(L)  Hematocrit 36.0 - 46.0 % 39.2 34.2(L) 31.1(L)  Platelets 150 - 400 K/uL 324 261 233    . CMP Latest Ref Rng & Units 08/22/2016 08/21/2016 08/15/2016  Glucose 65 - 99 mg/dL 99 103(H) 97  BUN 6 - 20 mg/dL 8 8 <5(L)  Creatinine 0.44 - 1.00 mg/dL 0.47 0.64 0.54  Sodium 135 - 145 mmol/L 127(L) 121(L) 134(L)  Potassium 3.5 - 5.1 mmol/L 3.5 4.1 3.9  Chloride 101 - 111 mmol/L 89(L) 80(L) 98(L)  CO2 22 - 32 mmol/L 30 29 28   Calcium 8.9 - 10.3 mg/dL 9.4 10.2 9.0  Total Protein 6.5 - 8.1 g/dL 6.1(L) 7.8 -  Total Bilirubin 0.3 - 1.2 mg/dL 1.3(H) 1.6(H) -  Alkaline Phos 38 - 126 U/L 102 121 -  AST 15 - 41 U/L 43(H) 63(H) -  ALT 14 - 54 U/L 23 31 -     RADIOGRAPHIC STUDIES: I have personally reviewed the radiological images as listed  and agreed with the findings in the report. Dg Abd 1 View  Result Date: 08/11/2016 CLINICAL DATA:  Nausea, constipation EXAM: ABDOMEN - 1 VIEW COMPARISON:  None. FINDINGS: The bowel gas pattern is normal. Phleboliths are noted within the pelvis. There is lower lumbar degenerative disc and facet arthropathy from approximately L4 caudad with osteoarthritis of the sacroiliac joints bilaterally and pubic symphysis. The partially visualized left total hip arthroplasty is noted with the acetabular component placed slightly more cephalad than the normal expected location of the left acetabulum. 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 The right hip demonstrates native joint space narrowing. IMPRESSION: No significant stool burden. No bowel obstruction. 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 Electronically Signed   By: Ashley Royalty M.D.   On: 08/11/2016 03:15   Ct Chest W Contrast  Result Date: 08/12/2016 CLINICAL DATA:  Mid abdominal pain for 1 month. Worse with eating. MRI demonstrating adenopathy. EXAM: CT CHEST, ABDOMEN, AND PELVIS WITH CONTRAST TECHNIQUE: Multidetector CT imaging of the chest, abdomen and pelvis was performed following the standard protocol during bolus administration of intravenous contrast. CONTRAST:  78mL ISOVUE-300 IOPAMIDOL (ISOVUE-300) INJECTION 61% COMPARISON:  08/09/2016 abdominal MRI. FINDINGS: CT CHEST FINDINGS Cardiovascular: Tortuous thoracic aorta. Moderate cardiomegaly, without pericardial effusion. No central pulmonary embolism, on this non-dedicated study. Mediastinum/Nodes: No supraclavicular adenopathy. Subcentimeter right thyroid nodule is nonspecific. No middle mediastinal or hilar adenopathy in. Necrotic adenopathy in the prevascular space measures 2.2 by 2.0 cm on image 20/series 2.  Lungs/Pleura: Small bilateral pleural effusions. Mild bibasilar atelectasis. Musculoskeletal: No acute osseous abnormality. Bilateral glenohumeral joint osteoarthritis. CT ABDOMEN PELVIS FINDINGS Hepatobiliary: Normal liver. Normal gallbladder, without biliary ductal dilatation. Pancreas: Pancreatic atrophy. Pancreatic head displaced anteriorly by portacaval adenopathy. No pancreatic duct dilatation. Spleen: Hypoattenuating splenic lesions, larger  of which measures 2.1 cm. No splenomegaly. Adrenals/Urinary Tract: Normal adrenal glands. Heterogeneous lower pole right renal exophytic 3.6 cm mass. Normal left kidney. No hydronephrosis. Degraded evaluation of the pelvis, secondary to beam hardening artifact from left hip arthroplasty. Grossly normal urinary bladder. Stomach/Bowel: Gastric antrum displaced anteriorly by abdominal adenopathy. No obstruction. Normal colon and terminal ileum. Normal small bowel caliber. Vascular/Lymphatic: Aortic and branch vessel atherosclerosis. Extensive abdominal adenopathy. Nodal mass in the portal caval space measures 4.4 x 5.6 cm on image 57/series 2. Retroperitoneal adenopathy, with a necrotic retrocaval node measuring 3.3 x 4.2 cm on image 66/series 2. Extensive small bowel mesenteric adenopathy. Pelvic sidewalls not well evaluated. Reproductive: Hysterectomy.  No gross adnexal mass. Other: No significant free fluid. Musculoskeletal: Left hip arthroplasty.  Right hip osteoarthritis. IMPRESSION: 1. Adenopathy within the chest and abdomen, most consistent with lymphoma. 2. Degraded evaluation of the pelvis, secondary to beam hardening artifact from left hip arthroplasty 3. Right renal mass, suspicious for renal cell carcinoma. Please see recent abdominal MRI for further description. 4. Small bilateral pleural effusions. 5.  Aortic atherosclerosis. Electronically Signed   By: Abigail Miyamoto M.D.   On: 08/12/2016 13:30   Mr Jeri Cos ZW Contrast  Result Date: 08/22/2016 CLINICAL DATA:   Non-Hodgkin's lymphoma, abdominal pain, and weight loss. EXAM: MRI HEAD WITHOUT AND WITH CONTRAST TECHNIQUE: Multiplanar, multiecho pulse sequences of the brain and surrounding structures were obtained without and with intravenous contrast. CONTRAST:  73mL MULTIHANCE GADOBENATE DIMEGLUMINE 529 MG/ML IV SOLN COMPARISON:  None. FINDINGS: Brain: No evidence for acute infarction, hemorrhage, mass lesion, hydrocephalus, or extra-axial fluid. Mild atrophy. Mild subcortical and periventricular T2 and FLAIR hyperintensities, likely chronic microvascular ischemic change. Post infusion, no abnormal enhancement of the brain or meninges. Partial empty sella. Vascular: Normal flow voids. Skull and upper cervical spine: Normal marrow signal. Cervical spondylosis. Sinuses/Orbits: Chronic sinus disease. No acute orbital findings or masses. Other: None. IMPRESSION: Mild atrophy and small vessel disease. No acute intracranial findings. No abnormal postcontrast enhancement or osseous changes to suggest lymphoma within or surrounding the visualized CNS. Electronically Signed   By: Staci Righter M.D.   On: 08/22/2016 18:47   Mr Abdomen W Or Wo Contrast  Result Date: 08/09/2016 CLINICAL DATA:  Abdominal mass. Abdominal pain. Nausea and vomiting. EXAM: MRI ABDOMEN WITHOUT AND WITH CONTRAST TECHNIQUE: Multiplanar multisequence MR imaging of the abdomen was performed both before and after the administration of intravenous contrast. CONTRAST:  64mL MULTIHANCE GADOBENATE DIMEGLUMINE 529 MG/ML IV SOLN COMPARISON:  Ultrasound 08/09/2016 FINDINGS: Lower chest: Lung bases are clear. Hepatobiliary: No focal hepatic lesion. Large nodal mass in the porta hepatis measuring 6.0 x 4.2 cm is most consistent with large periportal lymph node. There is no biliary duct dilatation. Gallbladder normal. Common bile duct is difficult to follow. Portal veins are patent. Pancreas: Pancreas is difficult to evaluate. There is bulky adenopathy within the  mesenteries adjacent the pancreas which makes defining the pancreatic tissue difficult. No discrete pancreatic lesion is identified. Spleen: Single 1.6 cm lesion within the spleen. Cannot exclude lymphoma lesion within the spleen. Spleen is normal volume.) Adrenals/urinary tract: Adrenal glands are normal. Along the lateral cortex of the inferior RIGHT kidney there is a 3.4 x 4.0 cm enhancing mass (image 55, series 1501. This is clearly depicted on T2 weighted image 34, series 3. Stomach/Bowel: Stomach, small-bowel in limited view the: Unremarkable. No bowel obstruction Vascular/Lymphatic: Abdominal aorta normal caliber. There is bulky adenopathy within the small bowel mesenteries centrally. Coronal image 12  series 10 and demonstrates no masses well. Mass lesions also demonstrated well on the eighth diffusion-weighted Ob 500 series. Example noted the LEFT upper abdomen measures 3.1 cm short axis. Course periportal nodes again demonstrated. Retroperitoneal nodes LEFT of the aorta measure 1.6 cm short axis (image 51, series 4. Likely large. Caval lymph node measuring 3.0 cm (image 54, series 4 Other: No free fluid. Musculoskeletal: No aggressive osseous lesion. IMPRESSION: 1. 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 Retroperitoneal adenopathy extends along the aorta and IVC 2. Indeterminate lesion within normal volume spleen. 3. Recommend oncology consultation and FDG PET scan for further staging and biopsy planning. 4. Additional 4 cm lesion exophytic from the inferior pole the RIGHT kidney is concerning for RENAL CELL CARCINOMA. Electronically Signed   By: Suzy Bouchard M.D.   On: 08/09/2016 12:35   US Abdomen Complete  Result Date: 08/09/2016 CLINICAL DATA:  Abdominal pain for 3 months EXAM: ABDOMEN ULTRASOUND COMPLETE COMPARISON:  None. FINDINGS: Gallbladder: No gallstones or wall thickening visualized. No sonographic Murphy sign noted by sonographer. Common bile  duct: Diameter: Normal caliber, 2 mm Liver: There is a 5.9 x 4.8 x 4.6 cm mixed echotexture solid lesion in the left hepatic lobe. Overall, the lesion is hypoechoic. This is nonspecific and warrants additional characterization. IVC: No abnormality visualized. Pancreas: Enlarged hypoechoic mass in the pancreatic tail measures 8.3 x 7.8 x 5.9 cm. Hypoechoic mass in the region the pancreatic head measures 3.2 x 2.2 x 4.5 cm. Spleen: Normal size. Hypoechoic lesion centrally within the spleen measures 1.9 x 1.5 x 1.3 cm. Right Kidney: Length: 8.1 cm. No hydronephrosis. There is a solid mass off the lower pole of the right kidney measuring 3.4 x 3.3 x 3.2 cm. 1.6 cm exophytic cyst off the midpole. Left Kidney: Length: 9.8 cm. Echogenicity within normal limits. No mass or hydronephrosis visualized. Abdominal aorta: No aneurysm visualized. Other findings: Within the right lower quadrant, there is a solid mass measuring 5.8 x 3.7 x 3.2 cm of unknown etiology or source. This is adjacent to the aorta. IMPRESSION: Numerous solid organ abnormalities. 5.9 cm hypoechoic solid mass in the left hepatic lobe. At least 2 large masses in the pancreas, both pancreatic head and pancreatic tail. Hypoechoic lesions centrally within the spleen. 3.4 cm solid mass off the lower pole of the right kidney. In addition, there is a large soft tissue mass in the right lower quadrant of the abdomen adjacent to the aorta measuring up to 5.8 cm. These areas are concerning for malignancy and warrant further evaluation. Recommend MRI of the abdomen without and with contrast for further evaluation. Electronically Signed   By: Rolm Baptise M.D.   On: 08/09/2016 09:24   Ct Abdomen Pelvis W Contrast  Result Date: 08/12/2016 CLINICAL DATA:  Mid abdominal pain for 1 month. Worse with eating. MRI demonstrating adenopathy. EXAM: CT CHEST, ABDOMEN, AND PELVIS WITH CONTRAST TECHNIQUE: Multidetector CT imaging of the chest, abdomen and pelvis was performed  following the standard protocol during bolus administration of intravenous contrast. CONTRAST:  20mL ISOVUE-300 IOPAMIDOL (ISOVUE-300) INJECTION 61% COMPARISON:  08/09/2016 abdominal MRI. FINDINGS: CT CHEST FINDINGS Cardiovascular: Tortuous thoracic aorta. Moderate cardiomegaly, without pericardial effusion. No central pulmonary embolism, on this non-dedicated study. Mediastinum/Nodes: No supraclavicular adenopathy. Subcentimeter right thyroid nodule is nonspecific. No middle mediastinal or hilar adenopathy in. Necrotic adenopathy in the prevascular space measures 2.2 by 2.0 cm on image 20/series 2. Lungs/Pleura: Small bilateral pleural effusions. Mild bibasilar atelectasis. Musculoskeletal: No acute  osseous abnormality. Bilateral glenohumeral joint osteoarthritis. CT ABDOMEN PELVIS FINDINGS Hepatobiliary: Normal liver. Normal gallbladder, without biliary ductal dilatation. Pancreas: Pancreatic atrophy. Pancreatic head displaced anteriorly by portacaval adenopathy. No pancreatic duct dilatation. Spleen: Hypoattenuating splenic lesions, larger of which measures 2.1 cm. No splenomegaly. Adrenals/Urinary Tract: Normal adrenal glands. Heterogeneous lower pole right renal exophytic 3.6 cm mass. Normal left kidney. No hydronephrosis. Degraded evaluation of the pelvis, secondary to beam hardening artifact from left hip arthroplasty. Grossly normal urinary bladder. Stomach/Bowel: Gastric antrum displaced anteriorly by abdominal adenopathy. No obstruction. Normal colon and terminal ileum. Normal small bowel caliber. Vascular/Lymphatic: Aortic and branch vessel atherosclerosis. Extensive abdominal adenopathy. Nodal mass in the portal caval space measures 4.4 x 5.6 cm on image 57/series 2. Retroperitoneal adenopathy, with a necrotic retrocaval node measuring 3.3 x 4.2 cm on image 66/series 2. Extensive small bowel mesenteric adenopathy. Pelvic sidewalls not well evaluated. Reproductive: Hysterectomy.  No gross adnexal mass.  Other: No significant free fluid. Musculoskeletal: Left hip arthroplasty.  Right hip osteoarthritis. IMPRESSION: 1. Adenopathy within the chest and abdomen, most consistent with lymphoma. 2. Degraded evaluation of the pelvis, secondary to beam hardening artifact from left hip arthroplasty 3. Right renal mass, suspicious for renal cell carcinoma. Please see recent abdominal MRI for further description. 4. Small bilateral pleural effusions. 5.  Aortic atherosclerosis. Electronically Signed   By: Abigail Miyamoto M.D.   On: 08/12/2016 13:30   Ct Biopsy  Result Date: 08/14/2016 INDICATION: No known primary, now with indeterminate right-sided renal lesion and retroperitoneal lymphadenopathy. Please perform CT-guided biopsy for tissue diagnostic purposes. EXAM: CT-GUIDED BIOPSY OF INDETERMINATE RIGHT-SIDED RETROPERITONEAL LYMPHADENOPATHY. COMPARISON:  CT the chest, abdomen and pelvis - 07/16/2016 MEDICATIONS: None. ANESTHESIA/SEDATION: Fentanyl 100 mcg IV; Versed 2 mg IV Sedation time: 15 minutes; The patient was continuously monitored during the procedure by the interventional radiology nurse under my direct supervision. CONTRAST:  None. COMPLICATIONS: None immediate. PROCEDURE: Informed consent was obtained from the patient following an explanation of the procedure, risks, benefits and alternatives. A time out was performed prior to the initiation of the procedure. The patient was positioned prone on the CT table and a limited CT was performed for procedural planning demonstrating unchanged size and appearance of the dominant right-sided retroperitoneal nodal conglomeration with dominant component measuring approximately 3.5 x 2.9 cm (image 25, series 3). The procedure was planned. The operative site was prepped and draped in the usual sterile fashion. Appropriate trajectory was confirmed with a 22 gauge spinal needle after the adjacent tissues were anesthetized with 1% Lidocaine with epinephrine. Under intermittent CT  guidance, a 17 gauge coaxial needle was advanced into the peripheral aspect of the mass. Appropriate positioning was confirmed and 5 core needle biopsy samples were obtained with an 18 gauge core needle biopsy device. The co-axial needle was removed and hemostasis was achieved with manual compression. A limited postprocedural CT was negative for hemorrhage or additional complication. A dressing was placed. The patient tolerated the procedure well without immediate postprocedural complication. IMPRESSION: Technically successful CT guided core needle biopsy of indeterminate right-sided retroperitoneal nodal conglomeration. Electronically Signed   By: Sandi Mariscal M.D.   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 Patient has extensive retroperitoneal mesenteric and periportal lymphadenopathy along with adenopathy in the chest and supraclavicular areas and a focal lesion in the spleen.  . Lab Results  Component  Value Date   LDH 321 (H) 08/22/2016    #2 right lower pole of the kidney lesion  ? Renal cell carcinoma versus involvement by lymphoma .  #3 significant fatigue and failure to thrive due to poor oral intake related lymphoma and type be constitutional symptoms .  #4 Hyponatremia thought to be related to dehydration - mainly managed with IV fluids as per hospitalist. Could have an element of SIADH related to lymphoma. MRI of the brain shows no overt evidence of lymphoma involvement.   PLAN -I discussed the new diagnosis of diffuse large B-cell lymphoma, natural history, prognosis, treatment options and possible adverse effects and alternatives in details with the patient. She had multiple questions which were answered in details. -She notes that her performance status was quite good and has deteriorated only since the last couple of months and prior to that she was  functioning completely independently in her senior housing facility living alone. -We did labs show elevated LDH, hepatitis profile and HIV test was done. -With patient's consent an echocardiogram was ordered and is currently pending. -MRI brain was done which does not show any overt evidence of involvement by lymphoma. -A Port-A-Cath has been ordered for placement while in-house. -Patient has been started on tumor lysis syndrome prophylaxis with allopurinol 100 mg by mouth twice a day. -We shall start her on prednisone 50 mg by mouth daily to help with her appetite and abdominal discomfort due to lymph node enlargement pending starting definitive therapy. -Pain management as per hospitalist. -If her echocardiogram shows normal ejection fraction we will plan to treat with R-mini CHOP with G-CSF support. -Given her burden of disease, high risk of tumor lysis syndrome, significant symptomatic disease causing recurrent admissions will work with pharmacist and nursing staff to determine if the first cycle of chemotherapy can be given as inpatient. -Nutritional consultation -Physical therapy and occupational therapy evaluation to determine discharge needs . -Management of hyponatremia and fluid as per hospitalist . -Would discontinue nonessential medications including simvastatin . -Goals of care were discussed in details . -Patient will ideally need an outpatient PET CT scan for complete staging of her lymphoma but cannot be done as inpatient and would not necessarily delay treatment for this given significantly symptomatic disease  -Would not biopsy renal lesion at this time. Would reassess this after couple of cycles of treatment for resolution that might suggest that this is a lymphoma. If persistent might need evaluation and possible consideration of treatment after a few cycles of treating her lymphoma which is more immediate concern. Depending on her clinical status at the time I need to consider  percutaneous interventions including percutaneous ablation therapy by interventional radiology vs laparoscopic surgical resection based on her clinical status. -She will need to follow-up in clinic in 7-10 days after her first cycle of chemotherapy for toxicity check and repeat labs.   All of the patients questions were answered with apparent satisfaction. The patient knows to call the clinic with any problems, questions or concerns.  I spent 60 minutes counseling the patient face to face. The total time spent in the appointment was 80 minutes and more than 50% was on counseling and direct patient cares.    Sullivan Lone MD Eleva AAHIVMS [facility] Hematology/Oncology Physician [facility]  (Office):       [phone] (Work cell):  [phone] (Fax):           [phone]  08/22/2016 9:13 AM

## 2016-08-22 NOTE — Progress Notes (Signed)
Patient has large B-cell lymphoma and was supposed to see me on 08/23/2016. I'm aware of the patients admission to the hospital and I requested Dr. Irene Limbo my partner who treats him malignancies and is an expert in [place] to see the patient in the hospital and determine if she needs to be treated urgently as an inpatient. I am on call over the weekend and would be happy to follow her through the weekend.

## 2016-08-23 ENCOUNTER — Ambulatory Visit: Payer: Medicare Other | Admitting: Hematology and Oncology

## 2016-08-23 ENCOUNTER — Inpatient Hospital Stay (HOSPITAL_COMMUNITY): Payer: Medicare Other

## 2016-08-23 ENCOUNTER — Encounter (HOSPITAL_COMMUNITY): Payer: Self-pay | Admitting: Family Medicine

## 2016-08-23 DIAGNOSIS — I369 Nonrheumatic tricuspid valve disorder, unspecified: Secondary | ICD-10-CM

## 2016-08-23 DIAGNOSIS — I340 Nonrheumatic mitral (valve) insufficiency: Secondary | ICD-10-CM | POA: Diagnosis present

## 2016-08-23 HISTORY — DX: Nonrheumatic mitral (valve) insufficiency: I34.0

## 2016-08-23 HISTORY — PX: IR FLUORO GUIDE PORT INSERTION RIGHT: IMG5741

## 2016-08-23 HISTORY — PX: IR US GUIDE VASC ACCESS RIGHT: IMG2390

## 2016-08-23 LAB — BASIC METABOLIC PANEL
ANION GAP: 7 (ref 5–15)
BUN: 7 mg/dL (ref 6–20)
CALCIUM: 9.4 mg/dL (ref 8.9–10.3)
CO2: 29 mmol/L (ref 22–32)
CREATININE: 0.53 mg/dL (ref 0.44–1.00)
Chloride: 95 mmol/L — ABNORMAL LOW (ref 101–111)
GLUCOSE: 146 mg/dL — AB (ref 65–99)
Potassium: 4.3 mmol/L (ref 3.5–5.1)
Sodium: 131 mmol/L — ABNORMAL LOW (ref 135–145)

## 2016-08-23 LAB — CBC WITH DIFFERENTIAL/PLATELET
BASOS ABS: 0 10*3/uL (ref 0.0–0.1)
BASOS PCT: 0 %
Eosinophils Absolute: 0 10*3/uL (ref 0.0–0.7)
Eosinophils Relative: 0 %
HEMATOCRIT: 33.2 % — AB (ref 36.0–46.0)
Hemoglobin: 10.4 g/dL — ABNORMAL LOW (ref 12.0–15.0)
LYMPHS ABS: 0.3 10*3/uL — AB (ref 0.7–4.0)
LYMPHS PCT: 8 %
MCH: 27.7 pg (ref 26.0–34.0)
MCHC: 31.3 g/dL (ref 30.0–36.0)
MCV: 88.5 fL (ref 78.0–100.0)
MONO ABS: 0.1 10*3/uL (ref 0.1–1.0)
Monocytes Relative: 3 %
NEUTROS ABS: 3.1 10*3/uL (ref 1.7–7.7)
Neutrophils Relative %: 89 %
Platelets: 248 10*3/uL (ref 150–400)
RBC: 3.75 MIL/uL — ABNORMAL LOW (ref 3.87–5.11)
RDW: 14.1 % (ref 11.5–15.5)
WBC: 3.5 10*3/uL — ABNORMAL LOW (ref 4.0–10.5)

## 2016-08-23 LAB — ECHOCARDIOGRAM COMPLETE: HEIGHTINCHES: 61 in

## 2016-08-23 LAB — HEPATITIS B CORE ANTIBODY, TOTAL: HEP B C TOTAL AB: NEGATIVE

## 2016-08-23 LAB — HIV ANTIBODY (ROUTINE TESTING W REFLEX): HIV Screen 4th Generation wRfx: NONREACTIVE

## 2016-08-23 LAB — HCV COMMENT:

## 2016-08-23 LAB — HEPATITIS B SURFACE ANTIGEN: HEP B S AG: NEGATIVE

## 2016-08-23 LAB — HEPATITIS C ANTIBODY (REFLEX)

## 2016-08-23 MED ORDER — HEPARIN SOD (PORK) LOCK FLUSH 100 UNIT/ML IV SOLN
INTRAVENOUS | Status: AC
  Filled 2016-08-23: qty 5

## 2016-08-23 MED ORDER — MIDAZOLAM HCL 2 MG/2ML IJ SOLN
INTRAMUSCULAR | Status: AC
  Filled 2016-08-23: qty 6

## 2016-08-23 MED ORDER — LIDOCAINE-EPINEPHRINE (PF) 2 %-1:200000 IJ SOLN
INTRAMUSCULAR | Status: AC
  Filled 2016-08-23: qty 20

## 2016-08-23 MED ORDER — FENTANYL CITRATE (PF) 100 MCG/2ML IJ SOLN
INTRAMUSCULAR | Status: AC | PRN
  Administered 2016-08-23: 25 ug via INTRAVENOUS
  Administered 2016-08-23: 50 ug via INTRAVENOUS

## 2016-08-23 MED ORDER — ASPIRIN 81 MG PO CHEW
81.0000 mg | CHEWABLE_TABLET | Freq: Every day | ORAL | Status: DC
  Administered 2016-08-23 – 2016-08-27 (×5): 81 mg via ORAL
  Filled 2016-08-23 (×5): qty 1

## 2016-08-23 MED ORDER — FENTANYL CITRATE (PF) 100 MCG/2ML IJ SOLN
INTRAMUSCULAR | Status: AC
  Filled 2016-08-23: qty 4

## 2016-08-23 MED ORDER — CEFAZOLIN SODIUM-DEXTROSE 2-4 GM/100ML-% IV SOLN
INTRAVENOUS | Status: AC
Start: 2016-08-23 — End: 2016-08-24
  Filled 2016-08-23: qty 100

## 2016-08-23 MED ORDER — CEFAZOLIN SODIUM-DEXTROSE 2-4 GM/100ML-% IV SOLN
2.0000 g | INTRAVENOUS | Status: AC
  Administered 2016-08-23: 2 g via INTRAVENOUS

## 2016-08-23 MED ORDER — MIDAZOLAM HCL 2 MG/2ML IJ SOLN
INTRAMUSCULAR | Status: AC | PRN
  Administered 2016-08-23 (×2): 0.5 mg via INTRAVENOUS
  Administered 2016-08-23: 1 mg via INTRAVENOUS

## 2016-08-23 NOTE — Progress Notes (Signed)
PROGRESS NOTE    Christina Peterson  WGN:[id number] DOB: 15-Mar-1941 DOA: 08/21/2016 PCP: Julius Bowels, MD (Inactive)    Brief Narrative:  This is a 76 year old female who was recently diagnosed with non-Hodgkin's lymphoma, presents with abdominal pain, nausea, decreased appetite and weight loss. No fevers, no diarrhea or vomiting. On initial physical examination temperature 97.7, blood pressure 148/78, heart rate 72, respiratory rate 18, oxygen saturation 100% on room air, oral mucosa is dry, her lungs are clear to auscultation, heart S1-S2 present rhythmic, her abdomen is soft, no masses palpable, no peritoneal signs, no lower extremity edema. Sodium 121, potassium 4.1, chloride 80, bicarbonate 29, glucose 103, BUN 8, creatinine 0.64, calcium 10.2, lipase 23, AST 63, ALT 31, white count 9.4, hemoglobin 12.9, hematocrit 39.2, platelets 324. Urine analysis negative for infection, specific gravity 1.012  The patient was admitted to hospital with the working diagnosis of hypovolemic hypo-osmolar hyponatremia complicated with metabolic alkalosis in the setting of newly diagnosed non-Hodgkin's lymphoma. Responding to IV fluids, follow on brain mri and oncology recommendations.     Assessment & Plan:   Principal Problem:   Hyponatremia Active Problems:   Normocytic normochromic anemia   Essential hypertension   Abdominal pain   Renal mass, right   Severe protein-calorie malnutrition (HCC)   B-cell lymphoma (HCC)   Renal cell adenocarcinoma (HCC)   Severe mitral regurgitation: Per 2 D echo 08/23/2016  #1 Hyponatremia Likely secondary to hypovolemic hyponatremia versus SIADH. Patient's serum osmolality was 259. Urine sodium was 63, urine osmolality was 354. Hyponatremia improving with hydration. Sodium now at 131 from 121 on admission. Patient with poor oral intake. Continue IV fluids. Follow.  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 Portal lymphadenopathy along with adenopathy in the chest and supraclavicular areas with focal lesion in the spleen per oncology. MRI brain done negative for any acute abnormalities are lymphoma involvement. 2-D echo with a EF of 78-46%, grade 2 diastolic dysfunction, severe mitral valvular regurgitation and mitral valvular prolapse. Patient to be started on chemotherapy per oncology Monday, 08/26/2016. Continue prednisone. Per oncology.  #3 severe mitral valvular regurgitation/mitral valvular prolapse Cardiology consultation pending. Patient to be seen 08/24/2016.  #4 right renal mass/renal lesion Consent as to whether this might be renal cell carcinoma versus lymphoma. Oncology at this time is not recommending a biopsy and recommending reassessment after couple cycles of treatment for resolution at which point in time will suggest a lymphoma. If persistent then may need further evaluation. We'll monitor for now.  #5 severe protein calorie malnutrition Secondary to problem #2. Nutritional supplementation.  #6 dyslipidemia Continue statin.  #7 hypertension Blood pressure stable. Continue decreased dose of Cozaar.  #8 metabolic alkalosis Resolved. Follow.  #9 anemia Patient with no overt bleeding. Check an anemia panel. Follow H&H.     DVT prophylaxis: Lovenox Code Status: Full Family Communication: Updated patient. No family at bedside. Disposition Plan: Home once hyponatremia has resolved, Port-A-Cath place, patient has received first dose of chemotherapy without any significant complications and per oncology.   Consultants:   Oncology: Dr.Kale 08/22/2016  Procedures:   Port-A-Cath placement pending 08/23/2016  MRI brain 08/22/2016  2-D echo 08/23/2016  Antimicrobials:   None.   Subjective: Patient denies any further nausea or emesis. Patient states some improvement with abdominal pain. Patient states she's hungry. No shortness of  breath. No chest pain. Patient with complaints of generalized weakness.  Objective: Vitals:   08/22/16 2154 08/23/16 0300 08/23/16  0535 08/23/16 1410  BP: 131/66  (!) 130/59 134/63  Pulse: 71  70 63  Resp: 16  16 17   Temp: 98.7 F (37.1 C)  98.6 F (37 C) 98.7 F (37.1 C)  TempSrc: Oral  Oral Oral  SpO2: 100%  100% 97%  Height:  5\' 1"  (1.549 m)      Intake/Output Summary (Last 24 hours) at 08/23/16 1523 Last data filed at 08/23/16 2263  Gross per 24 hour  Intake          1571.25 ml  Output                0 ml  Net          1571.25 ml   There were no vitals filed for this visit.  Examination:  General exam: Appears calm and comfortable  Respiratory system: Clear to auscultation. Respiratory effort normal. Cardiovascular system: S1 & S2 heard, 3/6 SEM. No JVD, murmurs, rubs, gallops or clicks. No pedal edema. Gastrointestinal system: Abdomen is nondistended, soft and some TTP. No organomegaly or masses felt. Normal bowel sounds heard. Central nervous system: Alert and oriented. No focal neurological deficits. Extremities: Symmetric 5 x 5 power. Skin: No rashes, lesions or ulcers Psychiatry: Judgement and insight appear normal. Mood & affect appropriate.     Data Reviewed: I have personally reviewed following labs and imaging studies  CBC:  Recent Labs Lab 08/21/16 1546 08/23/16 0347  WBC 9.4 3.5*  NEUTROABS 8.0* 3.1  HGB 12.9 10.4*  HCT 39.2 33.2*  MCV 86.0 88.5  PLT 324 335   Basic Metabolic Panel:  Recent Labs Lab 08/21/16 1546 08/22/16 0347 08/23/16 0347  NA 121* 127* 131*  K 4.1 3.5 4.3  CL 80* 89* 95*  CO2 29 30 29   GLUCOSE 103* 99 146*  BUN 8 8 7   CREATININE 0.64 0.47 0.53  CALCIUM 10.2 9.4 9.4   GFR: Estimated Creatinine Clearance: 44.4 mL/min (by C-G formula based on SCr of 0.53 mg/dL). Liver Function Tests:  Recent Labs Lab 08/21/16 1546 08/22/16 0347  AST 63* 43*  ALT 31 23  ALKPHOS 121 102  BILITOT 1.6* 1.3*  PROT 7.8 6.1*    ALBUMIN 4.2 3.4*    Recent Labs Lab 08/21/16 1546  LIPASE 23   No results for input(s): AMMONIA in the last 168 hours. Coagulation Profile: No results for input(s): INR, PROTIME in the last 168 hours. Cardiac Enzymes: No results for input(s): CKTOTAL, CKMB, CKMBINDEX, TROPONINI in the last 168 hours. BNP (last 3 results) No results for input(s): PROBNP in the last 8760 hours. HbA1C: No results for input(s): HGBA1C in the last 72 hours. CBG: No results for input(s): GLUCAP in the last 168 hours. Lipid Profile: No results for input(s): CHOL, HDL, LDLCALC, TRIG, CHOLHDL, LDLDIRECT in the last 72 hours. Thyroid Function Tests: No results for input(s): TSH, T4TOTAL, FREET4, T3FREE, THYROIDAB in the last 72 hours. Anemia Panel: No results for input(s): VITAMINB12, FOLATE, FERRITIN, TIBC, IRON, RETICCTPCT in the last 72 hours. Sepsis Labs: No results for input(s): PROCALCITON, LATICACIDVEN in the last 168 hours.  No results found for this or any previous visit (from the past 240 hour(s)).       Radiology Studies: Mr Jeri Cos Wo Contrast  Result Date: 08/22/2016 CLINICAL DATA:  Non-Hodgkin's lymphoma, abdominal pain, and weight loss. EXAM: MRI HEAD WITHOUT AND WITH CONTRAST TECHNIQUE: Multiplanar, multiecho pulse sequences of the brain and surrounding structures were obtained without and with intravenous contrast. CONTRAST:  63mL  MULTIHANCE GADOBENATE DIMEGLUMINE 529 MG/ML IV SOLN COMPARISON:  None. FINDINGS: Brain: No evidence for acute infarction, hemorrhage, mass lesion, hydrocephalus, or extra-axial fluid. Mild atrophy. Mild subcortical and periventricular T2 and FLAIR hyperintensities, likely chronic microvascular ischemic change. Post infusion, no abnormal enhancement of the brain or meninges. Partial empty sella. Vascular: Normal flow voids. Skull and upper cervical spine: Normal marrow signal. Cervical spondylosis. Sinuses/Orbits: Chronic sinus disease. No acute orbital findings  or masses. Other: None. IMPRESSION: Mild atrophy and small vessel disease. No acute intracranial findings. No abnormal postcontrast enhancement or osseous changes to suggest lymphoma within or surrounding the visualized CNS. Electronically Signed   By: Staci Righter M.D.   On: 08/22/2016 18:47        Scheduled Meds: . allopurinol  100 mg Oral BID  . aspirin  81 mg Oral Daily  . calcium-vitamin D  1 tablet Oral BID  . docusate sodium  100 mg Oral Q12H  . enoxaparin (LOVENOX) injection  40 mg Subcutaneous Q24H  . feeding supplement  1 Container Oral TID WC  . feeding supplement (ENSURE ENLIVE)  237 mL Oral BID BM  . losartan  25 mg Oral Daily  . meloxicam  7.5 mg Oral Daily  . pantoprazole  20 mg Oral Daily  . polyethylene glycol  17 g Oral Daily  . predniSONE  50 mg Oral Q breakfast  . simvastatin  10 mg Oral QHS  . sucralfate  1 g Oral QID   Continuous Infusions: .  ceFAZolin (ANCEF) IV    . dextrose 5 % and 0.9% NaCl 75 mL/hr at 08/23/16 1032     LOS: 2 days    Time spent: 25 mins    Hosie Sharman, MD [facility] Pager [phone]  If 7PM-7AM, please contact night-coverage [email or web address] Password [facility] 08/23/2016, 3:23 PM

## 2016-08-23 NOTE — Progress Notes (Signed)
[facility]   HEMATOLOGY/ONCOLOGY INPATIENT PROGRESS NOTE  Date of Service: 08/23/2016  Inpatient Attending: .Eugenie Filler, MD   SUBJECTIVE   Patient was seen with her family at bedside. 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 Patient had an ECHO this AM -- result pending. Scheduled for Port-a-cath this afternoon.  OBJECTIVE:  NAD  PHYSICAL EXAMINATION: . Vitals:   08/22/16 2154 08/23/16 0300 08/23/16 0535 08/23/16 1410  BP: 131/66  (!) 130/59 134/63  Pulse: 71  70 63  Resp: 16  16 17   Temp: 98.7 F (37.1 C)  98.6 F (37 C) 98.7 F (37.1 C)  TempSrc: Oral  Oral Oral  SpO2: 100%  100% 97%  Height:  5\' 1"  (1.549 m)     There were no vitals filed for this visit. .There is no height or weight on file to calculate BMI.  GENERAL:alert, in no acute distress and comfortable SKIN: no acute rashes, no significant lesions EYES: conjunctiva are pink and non-injected, sclera anicteric OROPHARYNX: MMM, no exudates, no oropharyngeal erythema or ulceration NECK: supple, no JVD LYMPH:  b/l palpable supraclavicular LN's, no palpable cervical LNadenopathy. LUNGS: clear to auscultation b/l with normal respiratory effort HEART: regular rate & rhythm, 3 x 6 systolic murmur over the sternum and aortic area ? Aortic stenosis ABDOMEN:  normoactive bowel sounds , non tender, not distended. Extremity: no pedal edema PSYCH: alert & oriented x 3 with fluent speech NEURO: no focal motor/sensory deficits  MEDICAL HISTORY:  Past Medical History:  Diagnosis Date  . Arthritis   . Hypercholesteremia   . Hypertension    FOLLOWED BY DR Katherine Roan    SURGICAL HISTORY: Past Surgical History:  Procedure Laterality Date  .  ABDOMINAL HYSTERECTOMY    . JOINT REPLACEMENT    . 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 TOTAL HIP REVISION  05/02/2011   Procedure: TOTAL HIP REVISION;  Surgeon: Sharmon Revere, MD;  Location: [facility];  Service: Orthopedics;  Laterality: Left;  left total hip revision with bone grafting    SOCIAL HISTORY: Social History   Social History  . Marital status: Legally Separated    Spouse name: N/A  . Number of children: N/A  . Years of education: N/A   Occupational History  . Not on file.   Social History Main Topics  . Smoking status: Never Smoker  . Smokeless tobacco: Never Used  . Alcohol use No  . Drug use: No  . Sexual activity: Not on file   Other Topics Concern  . Not on file   Social History Narrative  . No narrative on file    FAMILY HISTORY: History reviewed. No pertinent family history.  ALLERGIES:  has No Known Allergies.  MEDICATIONS:  Scheduled Meds: . allopurinol  100 mg Oral BID  . aspirin  81 mg Oral Daily  . calcium-vitamin D  1 tablet Oral BID  . docusate sodium  100 mg Oral Q12H  . enoxaparin (LOVENOX) injection  40 mg Subcutaneous Q24H  . feeding supplement  1 Container Oral TID WC  . feeding supplement (ENSURE ENLIVE)  237 mL Oral BID BM  . losartan  25 mg Oral Daily  . meloxicam  7.5 mg Oral Daily  . pantoprazole  20 mg  Oral Daily  . polyethylene glycol  17 g Oral Daily  . predniSONE  50 mg Oral Q breakfast  . simvastatin  10 mg Oral QHS  . sucralfate  1 g Oral QID   Continuous Infusions: .  ceFAZolin (ANCEF) IV    . dextrose 5 % and 0.9% NaCl 75 mL/hr at 08/23/16 1032   PRN Meds:.acetaminophen **OR** acetaminophen, morphine injection, ondansetron **OR** ondansetron (ZOFRAN) IV, oxyCODONE-acetaminophen  REVIEW OF SYSTEMS:    10 Point review of Systems was done is negative except as noted above.   LABORATORY DATA:  I have reviewed the data as listed  . CBC Latest Ref Rng & Units 08/23/2016 08/21/2016 08/15/2016  WBC 4.0 - 10.5 K/uL  3.5(L) 9.4 6.9  Hemoglobin 12.0 - 15.0 g/dL 10.4(L) 12.9 10.9(L)  Hematocrit 36.0 - 46.0 % 33.2(L) 39.2 34.2(L)  Platelets 150 - 400 K/uL 248 324 261    . CMP Latest Ref Rng & Units 08/23/2016 08/22/2016 08/21/2016  Glucose 65 - 99 mg/dL 146(H) 99 103(H)  BUN 6 - 20 mg/dL 7 8 8   Creatinine 0.44 - 1.00 mg/dL 0.53 0.47 0.64  Sodium 135 - 145 mmol/L 131(L) 127(L) 121(L)  Potassium 3.5 - 5.1 mmol/L 4.3 3.5 4.1  Chloride 101 - 111 mmol/L 95(L) 89(L) 80(L)  CO2 22 - 32 mmol/L 29 30 29   Calcium 8.9 - 10.3 mg/dL 9.4 9.4 10.2  Total Protein 6.5 - 8.1 g/dL - 6.1(L) 7.8  Total Bilirubin 0.3 - 1.2 mg/dL - 1.3(H) 1.6(H)  Alkaline Phos 38 - 126 U/L - 102 121  AST 15 - 41 U/L - 43(H) 63(H)  ALT 14 - 54 U/L - 23 31     RADIOGRAPHIC STUDIES: I have personally reviewed the radiological images as listed and agreed with the findings in the report. Dg Abd 1 View  Result Date: 08/11/2016 CLINICAL DATA:  Nausea, constipation EXAM: ABDOMEN - 1 VIEW COMPARISON:  None. FINDINGS: The bowel gas pattern is normal. Phleboliths are noted within the pelvis. There is lower lumbar degenerative disc and facet arthropathy from approximately L4 caudad with osteoarthritis of the sacroiliac joints bilaterally and pubic symphysis. The partially visualized left total hip arthroplasty is noted with the acetabular component placed slightly more cephalad than the normal expected location of the left acetabulum. 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 The right hip demonstrates native joint space narrowing. IMPRESSION: No significant stool burden. No bowel obstruction. 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 Electronically Signed   By: Ashley Royalty M.D.   On: 08/11/2016 03:15   Ct Chest W Contrast  Result Date: 08/12/2016 CLINICAL DATA:  Mid abdominal  pain for 1 month. Worse with eating. MRI demonstrating adenopathy. EXAM: CT CHEST, ABDOMEN, AND PELVIS WITH CONTRAST TECHNIQUE: Multidetector CT imaging of the chest, abdomen and pelvis was performed following the standard protocol during bolus administration of intravenous contrast. CONTRAST:  29mL ISOVUE-300 IOPAMIDOL (ISOVUE-300) INJECTION 61% COMPARISON:  08/09/2016 abdominal MRI. FINDINGS: CT CHEST FINDINGS Cardiovascular: Tortuous thoracic aorta. Moderate cardiomegaly, without pericardial effusion. No central pulmonary embolism, on this non-dedicated study. Mediastinum/Nodes: No supraclavicular adenopathy. Subcentimeter right thyroid nodule is nonspecific. No middle mediastinal or hilar adenopathy in. Necrotic adenopathy in the prevascular space measures 2.2 by 2.0 cm on image 20/series 2. Lungs/Pleura: Small bilateral pleural effusions. Mild bibasilar atelectasis. Musculoskeletal: No acute osseous abnormality. Bilateral glenohumeral joint osteoarthritis.  CT ABDOMEN PELVIS FINDINGS Hepatobiliary: Normal liver. Normal gallbladder, without biliary ductal dilatation. Pancreas: Pancreatic atrophy. Pancreatic head displaced anteriorly by portacaval adenopathy. No pancreatic duct dilatation. Spleen: Hypoattenuating splenic lesions, larger of which measures 2.1 cm. No splenomegaly. Adrenals/Urinary Tract: Normal adrenal glands. Heterogeneous lower pole right renal exophytic 3.6 cm mass. Normal left kidney. No hydronephrosis. Degraded evaluation of the pelvis, secondary to beam hardening artifact from left hip arthroplasty. Grossly normal urinary bladder. Stomach/Bowel: Gastric antrum displaced anteriorly by abdominal adenopathy. No obstruction. Normal colon and terminal ileum. Normal small bowel caliber. Vascular/Lymphatic: Aortic and branch vessel atherosclerosis. Extensive abdominal adenopathy. Nodal mass in the portal caval space measures 4.4 x 5.6 cm on image 57/series 2. Retroperitoneal adenopathy, with a  necrotic retrocaval node measuring 3.3 x 4.2 cm on image 66/series 2. Extensive small bowel mesenteric adenopathy. Pelvic sidewalls not well evaluated. Reproductive: Hysterectomy.  No gross adnexal mass. Other: No significant free fluid. Musculoskeletal: Left hip arthroplasty.  Right hip osteoarthritis. IMPRESSION: 1. Adenopathy within the chest and abdomen, most consistent with lymphoma. 2. Degraded evaluation of the pelvis, secondary to beam hardening artifact from left hip arthroplasty 3. Right renal mass, suspicious for renal cell carcinoma. Please see recent abdominal MRI for further description. 4. Small bilateral pleural effusions. 5.  Aortic atherosclerosis. Electronically Signed   By: Abigail Miyamoto M.D.   On: 08/12/2016 13:30   Mr Jeri Cos IH Contrast  Result Date: 08/22/2016 CLINICAL DATA:  Non-Hodgkin's lymphoma, abdominal pain, and weight loss. EXAM: MRI HEAD WITHOUT AND WITH CONTRAST TECHNIQUE: Multiplanar, multiecho pulse sequences of the brain and surrounding structures were obtained without and with intravenous contrast. CONTRAST:  53mL MULTIHANCE GADOBENATE DIMEGLUMINE 529 MG/ML IV SOLN COMPARISON:  None. FINDINGS: Brain: No evidence for acute infarction, hemorrhage, mass lesion, hydrocephalus, or extra-axial fluid. Mild atrophy. Mild subcortical and periventricular T2 and FLAIR hyperintensities, likely chronic microvascular ischemic change. Post infusion, no abnormal enhancement of the brain or meninges. Partial empty sella. Vascular: Normal flow voids. Skull and upper cervical spine: Normal marrow signal. Cervical spondylosis. Sinuses/Orbits: Chronic sinus disease. No acute orbital findings or masses. Other: None. IMPRESSION: Mild atrophy and small vessel disease. No acute intracranial findings. No abnormal postcontrast enhancement or osseous changes to suggest lymphoma within or surrounding the visualized CNS. Electronically Signed   By: Staci Righter M.D.   On: 08/22/2016 18:47   Mr Abdomen  W Or Wo Contrast  Result Date: 08/09/2016 CLINICAL DATA:  Abdominal mass. Abdominal pain. Nausea and vomiting. EXAM: MRI ABDOMEN WITHOUT AND WITH CONTRAST TECHNIQUE: Multiplanar multisequence MR imaging of the abdomen was performed both before and after the administration of intravenous contrast. CONTRAST:  59mL MULTIHANCE GADOBENATE DIMEGLUMINE 529 MG/ML IV SOLN COMPARISON:  Ultrasound 08/09/2016 FINDINGS: Lower chest: Lung bases are clear. Hepatobiliary: No focal hepatic lesion. Large nodal mass in the porta hepatis measuring 6.0 x 4.2 cm is most consistent with large periportal lymph node. There is no biliary duct dilatation. Gallbladder normal. Common bile duct is difficult to follow. Portal veins are patent. Pancreas: Pancreas is difficult to evaluate. There is bulky adenopathy within the mesenteries adjacent the pancreas which makes defining the pancreatic tissue difficult. No discrete pancreatic lesion is identified. Spleen: Single 1.6 cm lesion within the spleen. Cannot exclude lymphoma lesion within the spleen. Spleen is normal volume.) Adrenals/urinary tract: Adrenal glands are normal. Along the lateral cortex of the inferior RIGHT kidney there is a 3.4 x 4.0 cm enhancing mass (image 55, series 1501. This is clearly depicted on T2 weighted image  34, series 3. Stomach/Bowel: Stomach, small-bowel in limited view the: Unremarkable. No bowel obstruction Vascular/Lymphatic: Abdominal aorta normal caliber. There is bulky adenopathy within the small bowel mesenteries centrally. Coronal image 12 series 10 and demonstrates no masses well. Mass lesions also demonstrated well on the eighth diffusion-weighted Ob 500 series. Example noted the LEFT upper abdomen measures 3.1 cm short axis. Course periportal nodes again demonstrated. Retroperitoneal nodes LEFT of the aorta measure 1.6 cm short axis (image 51, series 4. Likely large. Caval lymph node measuring 3.0 cm (image 54, series 4 Other: No free fluid.  Musculoskeletal: No aggressive osseous lesion. IMPRESSION: 1. 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 Retroperitoneal adenopathy extends along the aorta and IVC 2. Indeterminate lesion within normal volume spleen. 3. Recommend oncology consultation and FDG PET scan for further staging and biopsy planning. 4. Additional 4 cm lesion exophytic from the inferior pole the RIGHT kidney is concerning for RENAL CELL CARCINOMA. Electronically Signed   By: Suzy Bouchard M.D.   On: 08/09/2016 12:35   US Abdomen Complete  Result Date: 08/09/2016 CLINICAL DATA:  Abdominal pain for 3 months EXAM: ABDOMEN ULTRASOUND COMPLETE COMPARISON:  None. FINDINGS: Gallbladder: No gallstones or wall thickening visualized. No sonographic Murphy sign noted by sonographer. Common bile duct: Diameter: Normal caliber, 2 mm Liver: There is a 5.9 x 4.8 x 4.6 cm mixed echotexture solid lesion in the left hepatic lobe. Overall, the lesion is hypoechoic. This is nonspecific and warrants additional characterization. IVC: No abnormality visualized. Pancreas: Enlarged hypoechoic mass in the pancreatic tail measures 8.3 x 7.8 x 5.9 cm. Hypoechoic mass in the region the pancreatic head measures 3.2 x 2.2 x 4.5 cm. Spleen: Normal size. Hypoechoic lesion centrally within the spleen measures 1.9 x 1.5 x 1.3 cm. Right Kidney: Length: 8.1 cm. No hydronephrosis. There is a solid mass off the lower pole of the right kidney measuring 3.4 x 3.3 x 3.2 cm. 1.6 cm exophytic cyst off the midpole. Left Kidney: Length: 9.8 cm. Echogenicity within normal limits. No mass or hydronephrosis visualized. Abdominal aorta: No aneurysm visualized. Other findings: Within the right lower quadrant, there is a solid mass measuring 5.8 x 3.7 x 3.2 cm of unknown etiology or source. This is adjacent to the aorta. IMPRESSION: Numerous solid organ abnormalities. 5.9 cm hypoechoic solid mass in the left hepatic lobe. At least 2 large masses  in the pancreas, both pancreatic head and pancreatic tail. Hypoechoic lesions centrally within the spleen. 3.4 cm solid mass off the lower pole of the right kidney. In addition, there is a large soft tissue mass in the right lower quadrant of the abdomen adjacent to the aorta measuring up to 5.8 cm. These areas are concerning for malignancy and warrant further evaluation. Recommend MRI of the abdomen without and with contrast for further evaluation. Electronically Signed   By: Rolm Baptise M.D.   On: 08/09/2016 09:24   Ct Abdomen Pelvis W Contrast  Result Date: 08/12/2016 CLINICAL DATA:  Mid abdominal pain for 1 month. Worse with eating. MRI demonstrating adenopathy. EXAM: CT CHEST, ABDOMEN, AND PELVIS WITH CONTRAST TECHNIQUE: Multidetector CT imaging of the chest, abdomen and pelvis was performed following the standard protocol during bolus administration of intravenous contrast. CONTRAST:  18mL ISOVUE-300 IOPAMIDOL (ISOVUE-300) INJECTION 61% COMPARISON:  08/09/2016 abdominal MRI. FINDINGS: CT CHEST FINDINGS Cardiovascular: Tortuous thoracic aorta. Moderate cardiomegaly, without pericardial effusion. No central pulmonary embolism, on this non-dedicated study. Mediastinum/Nodes: No supraclavicular adenopathy. Subcentimeter right thyroid nodule is nonspecific. No  middle mediastinal or hilar adenopathy in. Necrotic adenopathy in the prevascular space measures 2.2 by 2.0 cm on image 20/series 2. Lungs/Pleura: Small bilateral pleural effusions. Mild bibasilar atelectasis. Musculoskeletal: No acute osseous abnormality. Bilateral glenohumeral joint osteoarthritis. CT ABDOMEN PELVIS FINDINGS Hepatobiliary: Normal liver. Normal gallbladder, without biliary ductal dilatation. Pancreas: Pancreatic atrophy. Pancreatic head displaced anteriorly by portacaval adenopathy. No pancreatic duct dilatation. Spleen: Hypoattenuating splenic lesions, larger of which measures 2.1 cm. No splenomegaly. Adrenals/Urinary Tract: Normal  adrenal glands. Heterogeneous lower pole right renal exophytic 3.6 cm mass. Normal left kidney. No hydronephrosis. Degraded evaluation of the pelvis, secondary to beam hardening artifact from left hip arthroplasty. Grossly normal urinary bladder. Stomach/Bowel: Gastric antrum displaced anteriorly by abdominal adenopathy. No obstruction. Normal colon and terminal ileum. Normal small bowel caliber. Vascular/Lymphatic: Aortic and branch vessel atherosclerosis. Extensive abdominal adenopathy. Nodal mass in the portal caval space measures 4.4 x 5.6 cm on image 57/series 2. Retroperitoneal adenopathy, with a necrotic retrocaval node measuring 3.3 x 4.2 cm on image 66/series 2. Extensive small bowel mesenteric adenopathy. Pelvic sidewalls not well evaluated. Reproductive: Hysterectomy.  No gross adnexal mass. Other: No significant free fluid. Musculoskeletal: Left hip arthroplasty.  Right hip osteoarthritis. IMPRESSION: 1. Adenopathy within the chest and abdomen, most consistent with lymphoma. 2. Degraded evaluation of the pelvis, secondary to beam hardening artifact from left hip arthroplasty 3. Right renal mass, suspicious for renal cell carcinoma. Please see recent abdominal MRI for further description. 4. Small bilateral pleural effusions. 5.  Aortic atherosclerosis. Electronically Signed   By: Abigail Miyamoto M.D.   On: 08/12/2016 13:30   Ct Biopsy  Result Date: 08/14/2016 INDICATION: No known primary, now with indeterminate right-sided renal lesion and retroperitoneal lymphadenopathy. Please perform CT-guided biopsy for tissue diagnostic purposes. EXAM: CT-GUIDED BIOPSY OF INDETERMINATE RIGHT-SIDED RETROPERITONEAL LYMPHADENOPATHY. COMPARISON:  CT the chest, abdomen and pelvis - 07/16/2016 MEDICATIONS: None. ANESTHESIA/SEDATION: Fentanyl 100 mcg IV; Versed 2 mg IV Sedation time: 15 minutes; The patient was continuously monitored during the procedure by the interventional radiology nurse under my direct supervision.  CONTRAST:  None. COMPLICATIONS: None immediate. PROCEDURE: Informed consent was obtained from the patient following an explanation of the procedure, risks, benefits and alternatives. A time out was performed prior to the initiation of the procedure. The patient was positioned prone on the CT table and a limited CT was performed for procedural planning demonstrating unchanged size and appearance of the dominant right-sided retroperitoneal nodal conglomeration with dominant component measuring approximately 3.5 x 2.9 cm (image 25, series 3). The procedure was planned. The operative site was prepped and draped in the usual sterile fashion. Appropriate trajectory was confirmed with a 22 gauge spinal needle after the adjacent tissues were anesthetized with 1% Lidocaine with epinephrine. Under intermittent CT guidance, a 17 gauge coaxial needle was advanced into the peripheral aspect of the mass. Appropriate positioning was confirmed and 5 core needle biopsy samples were obtained with an 18 gauge core needle biopsy device. The co-axial needle was removed and hemostasis was achieved with manual compression. A limited postprocedural CT was negative for hemorrhage or additional complication. A dressing was placed. The patient tolerated the procedure well without immediate postprocedural complication. IMPRESSION: Technically successful CT guided core needle biopsy of indeterminate right-sided retroperitoneal nodal conglomeration. Electronically Signed   By: Sandi Mariscal M.D.   On: 08/14/2016 13:50    ASSESSMENT & PLAN:     RADIOGRAPHIC STUDIES: I have personally reviewed the radiological images as listed and agreed with the findings in the report.  Imaging Results  Dg Abd 1 View  Result Date: 08/11/2016 CLINICAL DATA:  Nausea, constipation EXAM: ABDOMEN - 1 VIEW COMPARISON:  None. FINDINGS: The bowel gas pattern is normal. Phleboliths are noted within the pelvis. There is lower lumbar degenerative disc and facet  arthropathy from approximately L4 caudad with osteoarthritis of the sacroiliac joints bilaterally and pubic symphysis. The partially visualized left total hip arthroplasty is noted with the acetabular component placed slightly more cephalad than the normal expected location of the left acetabulum. 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 The right hip demonstrates native joint space narrowing. IMPRESSION: No significant stool burden. No bowel obstruction. 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 Electronically Signed   By: Ashley Royalty M.D.   On: 08/11/2016 03:15   Ct Chest W Contrast  Result Date: 08/12/2016 CLINICAL DATA:  Mid abdominal pain for 1 month. Worse with eating. MRI demonstrating adenopathy. EXAM: CT CHEST, ABDOMEN, AND PELVIS WITH CONTRAST TECHNIQUE: Multidetector CT imaging of the chest, abdomen and pelvis was performed following the standard protocol during bolus administration of intravenous contrast. CONTRAST:  67mL ISOVUE-300 IOPAMIDOL (ISOVUE-300) INJECTION 61% COMPARISON:  08/09/2016 abdominal MRI. FINDINGS: CT CHEST FINDINGS Cardiovascular: Tortuous thoracic aorta. Moderate cardiomegaly, without pericardial effusion. No central pulmonary embolism, on this non-dedicated study. Mediastinum/Nodes: No supraclavicular adenopathy. Subcentimeter right thyroid nodule is nonspecific. No middle mediastinal or hilar adenopathy in. Necrotic adenopathy in the prevascular space measures 2.2 by 2.0 cm on image 20/series 2. Lungs/Pleura: Small bilateral pleural effusions. Mild bibasilar atelectasis. Musculoskeletal: No acute osseous abnormality. Bilateral glenohumeral joint osteoarthritis. CT ABDOMEN PELVIS FINDINGS Hepatobiliary: Normal liver. Normal gallbladder, without biliary ductal dilatation. Pancreas: Pancreatic atrophy.  Pancreatic head displaced anteriorly by portacaval adenopathy. No pancreatic duct dilatation. Spleen: Hypoattenuating splenic lesions, larger of which measures 2.1 cm. No splenomegaly. Adrenals/Urinary Tract: Normal adrenal glands. Heterogeneous lower pole right renal exophytic 3.6 cm mass. Normal left kidney. No hydronephrosis. Degraded evaluation of the pelvis, secondary to beam hardening artifact from left hip arthroplasty. Grossly normal urinary bladder. Stomach/Bowel: Gastric antrum displaced anteriorly by abdominal adenopathy. No obstruction. Normal colon and terminal ileum. Normal small bowel caliber. Vascular/Lymphatic: Aortic and branch vessel atherosclerosis. Extensive abdominal adenopathy. Nodal mass in the portal caval space measures 4.4 x 5.6 cm on image 57/series 2. Retroperitoneal adenopathy, with a necrotic retrocaval node measuring 3.3 x 4.2 cm on image 66/series 2. Extensive small bowel mesenteric adenopathy. Pelvic sidewalls not well evaluated. Reproductive: Hysterectomy.  No gross adnexal mass. Other: No significant free fluid. Musculoskeletal: Left hip arthroplasty.  Right hip osteoarthritis. IMPRESSION: 1. Adenopathy within the chest and abdomen, most consistent with lymphoma. 2. Degraded evaluation of the pelvis, secondary to beam hardening artifact from left hip arthroplasty 3. Right renal mass, suspicious for renal cell carcinoma. Please see recent abdominal MRI for further description. 4. Small bilateral pleural effusions. 5.  Aortic atherosclerosis. Electronically Signed   By: Abigail Miyamoto M.D.   On: 08/12/2016 13:30   Mr Jeri Cos TS Contrast  Result Date: 08/22/2016 CLINICAL DATA:  Non-Hodgkin's lymphoma, abdominal pain, and weight loss. EXAM: MRI HEAD WITHOUT AND WITH CONTRAST TECHNIQUE: Multiplanar, multiecho pulse sequences of the brain and surrounding structures were obtained without and with intravenous contrast. CONTRAST:  72mL MULTIHANCE GADOBENATE DIMEGLUMINE 529 MG/ML IV  SOLN COMPARISON:  None. FINDINGS: Brain: No evidence for acute infarction, hemorrhage, mass lesion, hydrocephalus, or extra-axial  fluid. Mild atrophy. Mild subcortical and periventricular T2 and FLAIR hyperintensities, likely chronic microvascular ischemic change. Post infusion, no abnormal enhancement of the brain or meninges. Partial empty sella. Vascular: Normal flow voids. Skull and upper cervical spine: Normal marrow signal. Cervical spondylosis. Sinuses/Orbits: Chronic sinus disease. No acute orbital findings or masses. Other: None. IMPRESSION: Mild atrophy and small vessel disease. No acute intracranial findings. No abnormal postcontrast enhancement or osseous changes to suggest lymphoma within or surrounding the visualized CNS. Electronically Signed   By: Staci Righter M.D.   On: 08/22/2016 18:47   Mr Abdomen W Or Wo Contrast  Result Date: 08/09/2016 CLINICAL DATA:  Abdominal mass. Abdominal pain. Nausea and vomiting. EXAM: MRI ABDOMEN WITHOUT AND WITH CONTRAST TECHNIQUE: Multiplanar multisequence MR imaging of the abdomen was performed both before and after the administration of intravenous contrast. CONTRAST:  31mL MULTIHANCE GADOBENATE DIMEGLUMINE 529 MG/ML IV SOLN COMPARISON:  Ultrasound 08/09/2016 FINDINGS: Lower chest: Lung bases are clear. Hepatobiliary: No focal hepatic lesion. Large nodal mass in the porta hepatis measuring 6.0 x 4.2 cm is most consistent with large periportal lymph node. There is no biliary duct dilatation. Gallbladder normal. Common bile duct is difficult to follow. Portal veins are patent. Pancreas: Pancreas is difficult to evaluate. There is bulky adenopathy within the mesenteries adjacent the pancreas which makes defining the pancreatic tissue difficult. No discrete pancreatic lesion is identified. Spleen: Single 1.6 cm lesion within the spleen. Cannot exclude lymphoma lesion within the spleen. Spleen is normal volume.) Adrenals/urinary tract: Adrenal glands are normal.  Along the lateral cortex of the inferior RIGHT kidney there is a 3.4 x 4.0 cm enhancing mass (image 55, series 1501. This is clearly depicted on T2 weighted image 34, series 3. Stomach/Bowel: Stomach, small-bowel in limited view the: Unremarkable. No bowel obstruction Vascular/Lymphatic: Abdominal aorta normal caliber. There is bulky adenopathy within the small bowel mesenteries centrally. Coronal image 12 series 10 and demonstrates no masses well. Mass lesions also demonstrated well on the eighth diffusion-weighted Ob 500 series. Example noted the LEFT upper abdomen measures 3.1 cm short axis. Course periportal nodes again demonstrated. Retroperitoneal nodes LEFT of the aorta measure 1.6 cm short axis (image 51, series 4. Likely large. Caval lymph node measuring 3.0 cm (image 54, series 4 Other: No free fluid. Musculoskeletal: No aggressive osseous lesion. IMPRESSION: 1. 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 Retroperitoneal adenopathy extends along the aorta and IVC 2. Indeterminate lesion within normal volume spleen. 3. Recommend oncology consultation and FDG PET scan for further staging and biopsy planning. 4. Additional 4 cm lesion exophytic from the inferior pole the RIGHT kidney is concerning for RENAL CELL CARCINOMA. Electronically Signed   By: Suzy Bouchard M.D.   On: 08/09/2016 12:35   US Abdomen Complete  Result Date: 08/09/2016 CLINICAL DATA:  Abdominal pain for 3 months EXAM: ABDOMEN ULTRASOUND COMPLETE COMPARISON:  None. FINDINGS: Gallbladder: No gallstones or wall thickening visualized. No sonographic Murphy sign noted by sonographer. Common bile duct: Diameter: Normal caliber, 2 mm Liver: There is a 5.9 x 4.8 x 4.6 cm mixed echotexture solid lesion in the left hepatic lobe. Overall, the lesion is hypoechoic. This is nonspecific and warrants additional characterization. IVC: No abnormality visualized. Pancreas: Enlarged hypoechoic mass in the  pancreatic tail measures 8.3 x 7.8 x 5.9 cm. Hypoechoic mass in the region the pancreatic head measures 3.2 x 2.2 x 4.5 cm. Spleen: Normal size. Hypoechoic lesion centrally within the spleen measures 1.9 x 1.5 x 1.3 cm.  Right Kidney: Length: 8.1 cm. No hydronephrosis. There is a solid mass off the lower pole of the right kidney measuring 3.4 x 3.3 x 3.2 cm. 1.6 cm exophytic cyst off the midpole. Left Kidney: Length: 9.8 cm. Echogenicity within normal limits. No mass or hydronephrosis visualized. Abdominal aorta: No aneurysm visualized. Other findings: Within the right lower quadrant, there is a solid mass measuring 5.8 x 3.7 x 3.2 cm of unknown etiology or source. This is adjacent to the aorta. IMPRESSION: Numerous solid organ abnormalities. 5.9 cm hypoechoic solid mass in the left hepatic lobe. At least 2 large masses in the pancreas, both pancreatic head and pancreatic tail. Hypoechoic lesions centrally within the spleen. 3.4 cm solid mass off the lower pole of the right kidney. In addition, there is a large soft tissue mass in the right lower quadrant of the abdomen adjacent to the aorta measuring up to 5.8 cm. These areas are concerning for malignancy and warrant further evaluation. Recommend MRI of the abdomen without and with contrast for further evaluation. Electronically Signed   By: Rolm Baptise M.D.   On: 08/09/2016 09:24   Ct Abdomen Pelvis W Contrast  Result Date: 08/12/2016 CLINICAL DATA:  Mid abdominal pain for 1 month. Worse with eating. MRI demonstrating adenopathy. EXAM: CT CHEST, ABDOMEN, AND PELVIS WITH CONTRAST TECHNIQUE: Multidetector CT imaging of the chest, abdomen and pelvis was performed following the standard protocol during bolus administration of intravenous contrast. CONTRAST:  55mL ISOVUE-300 IOPAMIDOL (ISOVUE-300) INJECTION 61% COMPARISON:  08/09/2016 abdominal MRI. FINDINGS: CT CHEST FINDINGS Cardiovascular: Tortuous thoracic aorta. Moderate cardiomegaly, without pericardial  effusion. No central pulmonary embolism, on this non-dedicated study. Mediastinum/Nodes: No supraclavicular adenopathy. Subcentimeter right thyroid nodule is nonspecific. No middle mediastinal or hilar adenopathy in. Necrotic adenopathy in the prevascular space measures 2.2 by 2.0 cm on image 20/series 2. Lungs/Pleura: Small bilateral pleural effusions. Mild bibasilar atelectasis. Musculoskeletal: No acute osseous abnormality. Bilateral glenohumeral joint osteoarthritis. CT ABDOMEN PELVIS FINDINGS Hepatobiliary: Normal liver. Normal gallbladder, without biliary ductal dilatation. Pancreas: Pancreatic atrophy. Pancreatic head displaced anteriorly by portacaval adenopathy. No pancreatic duct dilatation. Spleen: Hypoattenuating splenic lesions, larger of which measures 2.1 cm. No splenomegaly. Adrenals/Urinary Tract: Normal adrenal glands. Heterogeneous lower pole right renal exophytic 3.6 cm mass. Normal left kidney. No hydronephrosis. Degraded evaluation of the pelvis, secondary to beam hardening artifact from left hip arthroplasty. Grossly normal urinary bladder. Stomach/Bowel: Gastric antrum displaced anteriorly by abdominal adenopathy. No obstruction. Normal colon and terminal ileum. Normal small bowel caliber. Vascular/Lymphatic: Aortic and branch vessel atherosclerosis. Extensive abdominal adenopathy. Nodal mass in the portal caval space measures 4.4 x 5.6 cm on image 57/series 2. Retroperitoneal adenopathy, with a necrotic retrocaval node measuring 3.3 x 4.2 cm on image 66/series 2. Extensive small bowel mesenteric adenopathy. Pelvic sidewalls not well evaluated. Reproductive: Hysterectomy.  No gross adnexal mass. Other: No significant free fluid. Musculoskeletal: Left hip arthroplasty.  Right hip osteoarthritis. IMPRESSION: 1. Adenopathy within the chest and abdomen, most consistent with lymphoma. 2. Degraded evaluation of the pelvis, secondary to beam hardening artifact from left hip arthroplasty 3. Right  renal mass, suspicious for renal cell carcinoma. Please see recent abdominal MRI for further description. 4. Small bilateral pleural effusions. 5.  Aortic atherosclerosis. Electronically Signed   By: Abigail Miyamoto M.D.   On: 08/12/2016 13:30   Ct Biopsy  Result Date: 08/14/2016 INDICATION: No known primary, now with indeterminate right-sided renal lesion and retroperitoneal lymphadenopathy. Please perform CT-guided biopsy for tissue diagnostic purposes. EXAM: CT-GUIDED BIOPSY OF INDETERMINATE  RIGHT-SIDED RETROPERITONEAL LYMPHADENOPATHY. COMPARISON:  CT the chest, abdomen and pelvis - 07/16/2016 MEDICATIONS: None. ANESTHESIA/SEDATION: Fentanyl 100 mcg IV; Versed 2 mg IV Sedation time: 15 minutes; The patient was continuously monitored during the procedure by the interventional radiology nurse under my direct supervision. CONTRAST:  None. COMPLICATIONS: None immediate. PROCEDURE: Informed consent was obtained from the patient following an explanation of the procedure, risks, benefits and alternatives. A time out was performed prior to the initiation of the procedure. The patient was positioned prone on the CT table and a limited CT was performed for procedural planning demonstrating unchanged size and appearance of the dominant right-sided retroperitoneal nodal conglomeration with dominant component measuring approximately 3.5 x 2.9 cm (image 25, series 3). The procedure was planned. The operative site was prepped and draped in the usual sterile fashion. Appropriate trajectory was confirmed with a 22 gauge spinal needle after the adjacent tissues were anesthetized with 1% Lidocaine with epinephrine. Under intermittent CT guidance, a 17 gauge coaxial needle was advanced into the peripheral aspect of the mass. Appropriate positioning was confirmed and 5 core needle biopsy samples were obtained with an 18 gauge core needle biopsy device. The co-axial needle was removed and hemostasis was achieved with manual  compression. A limited postprocedural CT was negative for hemorrhage or additional complication. A dressing was placed. The patient tolerated the procedure well without immediate postprocedural complication. IMPRESSION: Technically successful CT guided core needle biopsy of indeterminate right-sided retroperitoneal nodal conglomeration. Electronically Signed   By: Sandi Mariscal M.D.   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 Patient has extensive retroperitoneal mesenteric and periportal lymphadenopathy along with adenopathy in the chest and supraclavicular areas and a focal lesion in the spleen.  . Recent Labs       Lab Results  Component Value Date   LDH 321 (H) 08/22/2016      #2 right lower pole of the kidney lesion  ? Renal cell carcinoma versus involvement by lymphoma .  #3 significant fatigue and failure to thrive due to poor oral intake related lymphoma and type B constitutional symptoms .  #4 Hyponatremia thought to be related to dehydration - mainly managed with IV fluids as per hospitalist. Could have an element of SIADH related to lymphoma. MRI of the brain shows no overt evidence of lymphoma involvement.   PLAN -I re-discussed in the presence of her family the new diagnosis of diffuse large B-cell lymphoma, natural history, prognosis, treatment options and possible adverse effects and alternatives in details with the patient. She had multiple questions which were answered in details. Her son in [place] was updated on the phone. --MRI brain was done which does not show any overt evidence of involvement by lymphoma. -A Port-A-Cath has been ordered for placement while in-house- scheduled for this afternoon. -ECHO -done --- results currently pending. -continue on tumor lysis syndrome prophylaxis with allopurinol 100 mg by mouth twice a  day. -continue on prednisone 50 mg by mouth daily to help with her appetite and abdominal discomfort due to lymph node enlargement pending starting definitive therapy on Monday 08/26/2016. -Pain management as per hospitalist. -If her echocardiogram shows normal ejection fraction we will plan to treat with R-mini CHOP with G-CSF support. If cardiomyopathy noted with EF<45% would need to switch her Anthracycline for gemcitabine or Etoposide. -Discussed with pharmacist and nursing staff  we shall be able to do first cycle of chemotherapy as inpatient on 08/26/2016. Orders placed -Nutritional consultation -Physical therapy and occupational therapy evaluation to determine discharge needs . -Management of hyponatremia and fluid as per hospitalist . -Would discontinue nonessential medications including simvastatin . -Goals of care were discussed in details . -Patient will ideally need an outpatient PET CT scan for complete staging of her lymphoma but cannot be done as inpatient and would not necessarily delay treatment for this given significantly symptomatic disease  -Would not biopsy renal lesion at this time. Would reassess this after couple of cycles of treatment for resolution that might suggest that this is a lymphoma. If persistent might need evaluation and possible consideration of treatment after a few cycles of treating her lymphoma which is more immediate concern. Depending on her clinical status at the time I need to consider percutaneous interventions including percutaneous ablation therapy by interventional radiology vs laparoscopic surgical resection based on her clinical status. -She will need to follow-up in clinic in 7-10 days after her first cycle of chemotherapy for toxicity check and repeat labs.  I shall f/u on Monday for continued oncologic cares.   I spent 30 minutes counseling the patient face to face. The total time spent in the appointment was 40 minutes and more than 50% was on  counseling and direct patient cares.    Sullivan Lone MD [place] AAHIVMS [facility] Hematology/Oncology Physician [facility]  (Office):       [phone] (Work cell):  [phone] (Fax):           [phone]  08/23/2016 2:28 PM

## 2016-08-23 NOTE — Progress Notes (Signed)
Referring Physician(s): Kale,G  Supervising Physician: Daryll Brod  Patient Status:  [facility] - In-pt  Chief Complaint:  lymphoma  Subjective: Familiar to IR service from prior retroperitoneal lymph node biopsy 5/2. She has a history of newly diagnosed large B-cell lymphoma and request now received for Port-A-Cath placement. Patient is currently stable. Her main complaint today is intermittent back pain.  Past Medical History:  Diagnosis Date  . Arthritis   . Hypercholesteremia   . Hypertension    FOLLOWED BY DR Katherine Roan   Past Surgical History:  Procedure Laterality Date  . ABDOMINAL HYSTERECTOMY    . JOINT REPLACEMENT    . 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 TOTAL HIP REVISION  05/02/2011   Procedure: TOTAL HIP REVISION;  Surgeon: Sharmon Revere, MD;  Location: [place];  Service: Orthopedics;  Laterality: Left;  left total hip revision with bone grafting     Allergies: Patient has no known allergies.  Medications: Prior to Admission medications   Medication Sig Start Date End Date Taking? Authorizing Provider  Calcium Citrate-Vitamin D (CITRACAL MAXIMUM PO) Take 2 tablets by mouth daily.    Yes [provider]  docusate sodium (COLACE) 100 MG capsule Take 1 capsule (100 mg total) by mouth every 12 (twelve) hours. 08/15/16  Yes Barton Dubois, MD  losartan (COZAAR) 50 MG tablet Take 1 tablet (50 mg total) by mouth daily. 08/15/16  Yes Barton Dubois, MD  meloxicam (MOBIC) 7.5 MG tablet Take 7.5 mg by mouth daily.  03/04/16  Yes [provider]  oxyCODONE-acetaminophen (PERCOCET/ROXICET) 5-325 MG tablet Take 1 tablet by mouth every 4 (four) hours as needed for severe pain. 08/15/16  Yes Barton Dubois, MD  pantoprazole (PROTONIX) 20 MG tablet Take 1 tablet (20 mg total) by mouth daily. 07/19/16  Yes Lacretia Leigh, MD  polyethylene glycol [facility] / Floria Raveling) packet Take 17 g by mouth daily. 08/15/16  Yes Barton Dubois, MD  simvastatin (ZOCOR) 10 MG tablet  Take 10 mg by mouth at bedtime.     Yes [provider]  sucralfate (CARAFATE) 1 g tablet Take 1 tablet (1 g total) by mouth 4 (four) times daily. 07/19/16  Yes Lacretia Leigh, MD  feeding supplement (BOOST / RESOURCE BREEZE) LIQD Take 1 Container by mouth 2 (two) times daily between meals. Patient not taking: Reported on 08/21/2016 08/15/16   Barton Dubois, MD  feeding supplement, ENSURE ENLIVE, (ENSURE ENLIVE) LIQD Take 237 mLs by mouth 2 (two) times daily between meals. Patient not taking: Reported on 08/21/2016 08/15/16   Barton Dubois, MD     Vital Signs: BP (!) 130/59 (BP Location: Left Arm)   Pulse 70   Temp 98.6 F (37 C) (Oral)   Resp 16   Ht 5\' 1"  (1.549 m)   SpO2 100%   Physical Exam  awake, alert. palpable supraclavicular lymph nodes bilaterally Chest with clear breath sounds bilaterally, heart with regular rate and rhythm, positive murmur; abdomen soft, positive bowel sounds, nontender. No lower extremity edema. Imaging: Mr Jeri Cos GE Contrast  Result Date: 08/22/2016 CLINICAL DATA:  Non-Hodgkin's lymphoma, abdominal pain, and weight loss. EXAM: MRI HEAD WITHOUT AND WITH CONTRAST TECHNIQUE: Multiplanar, multiecho pulse sequences of the brain and surrounding structures were obtained without and with intravenous contrast. CONTRAST:  37mL MULTIHANCE GADOBENATE DIMEGLUMINE 529 MG/ML IV SOLN COMPARISON:  None. FINDINGS: Brain: No evidence for acute infarction, hemorrhage, mass lesion, hydrocephalus, or extra-axial fluid. Mild atrophy. Mild subcortical and periventricular T2 and  FLAIR hyperintensities, likely chronic microvascular ischemic change. Post infusion, no abnormal enhancement of the brain or meninges. Partial empty sella. Vascular: Normal flow voids. Skull and upper cervical spine: Normal marrow signal. Cervical spondylosis. Sinuses/Orbits: Chronic sinus disease. No acute orbital findings or masses. Other: None. IMPRESSION: Mild atrophy and small vessel disease. No acute  intracranial findings. No abnormal postcontrast enhancement or osseous changes to suggest lymphoma within or surrounding the visualized CNS. Electronically Signed   By: Staci Righter M.D.   On: 08/22/2016 18:47    Labs:  CBC:  Recent Labs  08/14/16 0407 08/15/16 0444 08/21/16 1546 08/23/16 0347  WBC 6.4 6.9 9.4 3.5*  HGB 10.0* 10.9* 12.9 10.4*  HCT 31.1* 34.2* 39.2 33.2*  PLT 233 261 324 248    COAGS:  Recent Labs  08/13/16 0350  INR 0.99    BMP:  Recent Labs  08/15/16 0444 08/21/16 1546 08/22/16 0347 08/23/16 0347  NA 134* 121* 127* 131*  K 3.9 4.1 3.5 4.3  CL 98* 80* 89* 95*  CO2 28 29 30 29   GLUCOSE 97 103* 99 146*  BUN <5* 8 8 7   CALCIUM 9.0 10.2 9.4 9.4  CREATININE 0.54 0.64 0.47 0.53  GFRNONAA >60 >60 >60 >60  GFRAA >60 >60 >60 >60    LIVER FUNCTION TESTS:  Recent Labs  08/10/16 2232 08/12/16 0358 08/21/16 1546 08/22/16 0347  BILITOT 0.8 0.5 1.6* 1.3*  AST 31 28 63* 43*  ALT 19 15 31 23   ALKPHOS 75 63 121 102  PROT 6.7 5.6* 7.8 6.1*  ALBUMIN 3.7 3.1* 4.2 3.4*    Assessment and Plan: Patient with history of newly diagnosed large B-cell lymphoma. Request received for Port-A-Cath placement for chemotherapy.Risks and benefits discussed with the patient/family including, but not limited to bleeding, infection, pneumothorax, or fibrin sheath development and need for additional procedures. All of the patient's questions were answered, patient is agreeable to proceed. Consent signed and in chart. Procedure tent scheduled  for later today.     Electronically Signed: D. Nash Mantis 08/23/2016, 9:47 AM   I spent a total of 20 minutes at the the patient's bedside AND on the patient's hospital floor or unit, greater than 50% of which was counseling/coordinating care for port a cath placement    Patient ID: Christina Peterson, female   DOB: 01/17/1941, 76 y.o.   MRN: [id number]

## 2016-08-23 NOTE — Progress Notes (Signed)
Chemo teaching was started by Nunzio Cory RN,needs more teachings, for chemo Monday.education materials at bedside.

## 2016-08-23 NOTE — Progress Notes (Signed)
  Echocardiogram 2D Echocardiogram has been performed.  Christina Peterson 08/23/2016, 12:48 PM

## 2016-08-23 NOTE — Procedures (Signed)
Lymphoma, access for chemotherapy  Status post right IJ single lumen power port  Tip SVC/RA junction  No immediate complication  EBL 0  Access ready for use  Full report in PACs

## 2016-08-24 DIAGNOSIS — I340 Nonrheumatic mitral (valve) insufficiency: Secondary | ICD-10-CM

## 2016-08-24 DIAGNOSIS — E43 Unspecified severe protein-calorie malnutrition: Secondary | ICD-10-CM

## 2016-08-24 LAB — BASIC METABOLIC PANEL
ANION GAP: 7 (ref 5–15)
BUN: 7 mg/dL (ref 6–20)
CALCIUM: 9.5 mg/dL (ref 8.9–10.3)
CO2: 28 mmol/L (ref 22–32)
Chloride: 97 mmol/L — ABNORMAL LOW (ref 101–111)
Creatinine, Ser: 0.53 mg/dL (ref 0.44–1.00)
GLUCOSE: 88 mg/dL (ref 65–99)
POTASSIUM: 3.4 mmol/L — AB (ref 3.5–5.1)
Sodium: 132 mmol/L — ABNORMAL LOW (ref 135–145)

## 2016-08-24 LAB — CBC WITH DIFFERENTIAL/PLATELET
BASOS ABS: 0 10*3/uL (ref 0.0–0.1)
BASOS PCT: 0 %
EOS PCT: 0 %
Eosinophils Absolute: 0 10*3/uL (ref 0.0–0.7)
HEMATOCRIT: 31 % — AB (ref 36.0–46.0)
Hemoglobin: 10.1 g/dL — ABNORMAL LOW (ref 12.0–15.0)
LYMPHS PCT: 14 %
Lymphs Abs: 1.1 10*3/uL (ref 0.7–4.0)
MCH: 28.5 pg (ref 26.0–34.0)
MCHC: 32.6 g/dL (ref 30.0–36.0)
MCV: 87.3 fL (ref 78.0–100.0)
Monocytes Absolute: 0.5 10*3/uL (ref 0.1–1.0)
Monocytes Relative: 7 %
NEUTROS ABS: 6.2 10*3/uL (ref 1.7–7.7)
Neutrophils Relative %: 79 %
PLATELETS: 245 10*3/uL (ref 150–400)
RBC: 3.55 MIL/uL — AB (ref 3.87–5.11)
RDW: 14.3 % (ref 11.5–15.5)
WBC: 7.8 10*3/uL (ref 4.0–10.5)

## 2016-08-24 MED ORDER — POTASSIUM CHLORIDE 20 MEQ/15ML (10%) PO SOLN
40.0000 meq | Freq: Once | ORAL | Status: AC
  Administered 2016-08-24: 40 meq via ORAL
  Filled 2016-08-24: qty 30

## 2016-08-24 NOTE — Progress Notes (Addendum)
PROGRESS NOTE    Christina Peterson  XQJ:[id number] DOB: 1940/08/20 DOA: 08/21/2016 PCP: Julius Bowels, MD (Inactive)    Brief Narrative:  This is a 76 year old female who was recently diagnosed with non-Hodgkin's lymphoma, presents with abdominal pain, nausea, decreased appetite and weight loss. No fevers, no diarrhea or vomiting. On initial physical examination temperature 97.7, blood pressure 148/78, heart rate 72, respiratory rate 18, oxygen saturation 100% on room air, oral mucosa is dry, her lungs are clear to auscultation, heart S1-S2 present rhythmic, her abdomen is soft, no masses palpable, no peritoneal signs, no lower extremity edema. Sodium 121, potassium 4.1, chloride 80, bicarbonate 29, glucose 103, BUN 8, creatinine 0.64, calcium 10.2, lipase 23, AST 63, ALT 31, white count 9.4, hemoglobin 12.9, hematocrit 39.2, platelets 324. Urine analysis negative for infection, specific gravity 1.012  The patient wasadmitted to hospital with the working diagnosis of hypovolemic hypo-osmolar hyponatremia complicated with metabolic alkalosis in the setting of newly diagnosed non-Hodgkin's lymphoma. Responding to IV fluids. MRI with no lesions, echocardiogram with preserved LV systolic function but severe mitral regurgitation with mitral valve prolapse. Scheduled for chemotherapy 05/14.    Assessment & Plan:   Principal Problem:   Hyponatremia Active Problems:   Normocytic normochromic anemia   Essential hypertension   Abdominal pain   Renal mass, right   Severe protein-calorie malnutrition (HCC)   B-cell lymphoma (HCC)   Renal cell adenocarcinoma (HCC)   Severe mitral regurgitation: Per 2 D echo 08/23/2016   1. Hypovolemic, hypo-osmolar hyponatremia. Serum Na up to 132, will continue hydration with NS at 75 cc per hour will follow on renal panel in am, renal function continue to be preserved with cr at 0.53, K at 3,4 and cl at 97 with serum bicarbonate at 28.   2. Metabolic alkalosis.  Stable serum bicarbonate 29 and 28. Tolerating well IV fluids with no signs of volume overload, noted elevated urinary chloride suggestive chloride resistance, possible due to hypoproteinemia, continue to replete K and follow on renal panel in am.   3. Hypertension.  Blood pressure systolic 448 to 185.  Losartan has been resumed.  4. Non-Hodgkin's lymphoma. MRI negative for lesions, will continue prednisone and allopurinol, planned for chemotherapy on 05/13. Port in place.   5. Dyslipidemia. Simvastatin.   6. New diagnosed severe mitral regurgitation. Patient with preserved LV systolic function with ejection fraction of 60%, continue hydration with saline, no signs of volume overload. Clinically patient with no symptoms. Will need follow up as outpatient.   7. Severe protein calorie malnutrition. On nutritional supplements, will continue to follow on nutritional recommendations.   DVT prophylaxis: enoxaparin  Code Status: full  Family Communication: I spoke with patient's family at the bedside and all questions were addressed.  Disposition Plan: home    Consultants:   Oncology   Procedures:     Antimicrobials:   Subjective: Moderate abdominal pain, worse with movement, no radiation, improved with pain medications, no associated nausea or vomiting. No dyspnea or chest pain.   Objective: Vitals:   08/23/16 1800 08/23/16 1830 08/23/16 2138 08/24/16 0520  BP: 138/64 138/71 (!) 113/55 116/64  Pulse: 70 70 71 (!) 58  Resp: 18 18 20 20   Temp: 98.6 F (37 C) 98.5 F (36.9 C) 98.3 F (36.8 C) 98.7 F (37.1 C)  TempSrc: Oral Oral Oral Oral  SpO2: 97% 98% 98% 98%  Height:        Intake/Output Summary (Last 24 hours) at 08/24/16 0931 Last data filed at 08/24/16  0800  Gross per 24 hour  Intake           2267.5 ml  Output                0 ml  Net           2267.5 ml   There were no vitals filed for this visit.  Examination:  General exam: deconditined E ENT.  Mild pallor, no icterus, oral mucosa moist.  Respiratory system: Clear to auscultation. Respiratory effort normal. No wheezing, rales or rhonchi.  Cardiovascular system: S1 & S2 heard, RRR. No JVD, murmurs, rubs, gallops or clicks. No pedal edema. Gastrointestinal system: Abdomen is nondistended, soft and nontender. No organomegaly or masses felt. Normal bowel sounds heard. Central nervous system: Alert and oriented. No focal neurological deficits. Extremities: Symmetric 5 x 5 power. Skin: No rashes, lesions or ulcers     Data Reviewed: I have personally reviewed following labs and imaging studies  CBC:  Recent Labs Lab 08/21/16 1546 08/23/16 0347 08/24/16 0650  WBC 9.4 3.5* 7.8  NEUTROABS 8.0* 3.1 6.2  HGB 12.9 10.4* 10.1*  HCT 39.2 33.2* 31.0*  MCV 86.0 88.5 87.3  PLT 324 248 732   Basic Metabolic Panel:  Recent Labs Lab 08/21/16 1546 08/22/16 0347 08/23/16 0347 08/24/16 0650  NA 121* 127* 131* 132*  K 4.1 3.5 4.3 3.4*  CL 80* 89* 95* 97*  CO2 29 30 29 28   GLUCOSE 103* 99 146* 88  BUN 8 8 7 7   CREATININE 0.64 0.47 0.53 0.53  CALCIUM 10.2 9.4 9.4 9.5   GFR: Estimated Creatinine Clearance: 44.4 mL/min (by C-G formula based on SCr of 0.53 mg/dL). Liver Function Tests:  Recent Labs Lab 08/21/16 1546 08/22/16 0347  AST 63* 43*  ALT 31 23  ALKPHOS 121 102  BILITOT 1.6* 1.3*  PROT 7.8 6.1*  ALBUMIN 4.2 3.4*    Recent Labs Lab 08/21/16 1546  LIPASE 23   No results for input(s): AMMONIA in the last 168 hours. Coagulation Profile: No results for input(s): INR, PROTIME in the last 168 hours. Cardiac Enzymes: No results for input(s): CKTOTAL, CKMB, CKMBINDEX, TROPONINI in the last 168 hours. BNP (last 3 results) No results for input(s): PROBNP in the last 8760 hours. HbA1C: No results for input(s): HGBA1C in the last 72 hours. CBG: No results for input(s): GLUCAP in the last 168 hours. Lipid Profile: No results for input(s): CHOL, HDL, LDLCALC, TRIG,  CHOLHDL, LDLDIRECT in the last 72 hours. Thyroid Function Tests: No results for input(s): TSH, T4TOTAL, FREET4, T3FREE, THYROIDAB in the last 72 hours. Anemia Panel: No results for input(s): VITAMINB12, FOLATE, FERRITIN, TIBC, IRON, RETICCTPCT in the last 72 hours. Sepsis Labs: No results for input(s): PROCALCITON, LATICACIDVEN in the last 168 hours.  No results found for this or any previous visit (from the past 240 hour(s)).       Radiology Studies: Mr Jeri Cos Wo Contrast  Result Date: 08/22/2016 CLINICAL DATA:  Non-Hodgkin's lymphoma, abdominal pain, and weight loss. EXAM: MRI HEAD WITHOUT AND WITH CONTRAST TECHNIQUE: Multiplanar, multiecho pulse sequences of the brain and surrounding structures were obtained without and with intravenous contrast. CONTRAST:  66mL MULTIHANCE GADOBENATE DIMEGLUMINE 529 MG/ML IV SOLN COMPARISON:  None. FINDINGS: Brain: No evidence for acute infarction, hemorrhage, mass lesion, hydrocephalus, or extra-axial fluid. Mild atrophy. Mild subcortical and periventricular T2 and FLAIR hyperintensities, likely chronic microvascular ischemic change. Post infusion, no abnormal enhancement of the brain or meninges. Partial empty sella. Vascular: Normal  flow voids. Skull and upper cervical spine: Normal marrow signal. Cervical spondylosis. Sinuses/Orbits: Chronic sinus disease. No acute orbital findings or masses. Other: None. IMPRESSION: Mild atrophy and small vessel disease. No acute intracranial findings. No abnormal postcontrast enhancement or osseous changes to suggest lymphoma within or surrounding the visualized CNS. Electronically Signed   By: Staci Righter M.D.   On: 08/22/2016 18:47   Ir US Guide Vasc Access Right  Result Date: 08/23/2016 CLINICAL DATA:  Lymphoma, access for chemotherapy EXAM: RIGHT INTERNAL JUGULAR SINGLE LUMEN POWER PORT CATHETER INSERTION Date:  5/11/20185/02/2017 4:19 pm Radiologist:  M. Daryll Brod, MD Guidance:  Ultrasound and fluoroscopic  MEDICATIONS: Ancef 2 g; The antibiotic was administered within an appropriate time interval prior to skin puncture. ANESTHESIA/SEDATION: Versed 2.0 mg IV; Fentanyl 75 mcg IV; Moderate Sedation Time:  24 minutes The patient was continuously monitored during the procedure by the interventional radiology nurse under my direct supervision. FLUOROSCOPY TIME:  54 seconds (4 mGy) COMPLICATIONS: None immediate. CONTRAST:  None. PROCEDURE: Informed consent was obtained from the patient following explanation of the procedure, risks, benefits and alternatives. The patient understands, agrees and consents for the procedure. All questions were addressed. A time out was performed. Maximal barrier sterile technique utilized including caps, mask, sterile gowns, sterile gloves, large sterile drape, hand hygiene, and 2% chlorhexidine scrub. Under sterile conditions and local anesthesia, right internal jugular micropuncture venous access was performed. Access was performed with ultrasound. Images were obtained for documentation. A guide wire was inserted followed by a transitional dilator. This allowed insertion of a guide wire and catheter into the IVC. Measurements were obtained from the SVC / RA junction back to the right IJ venotomy site. In the right infraclavicular chest, a subcutaneous pocket was created over the second anterior rib. This was done under sterile conditions and local anesthesia. 1% lidocaine with epinephrine was utilized for this. A 2.5 cm incision was made in the skin. Blunt dissection was performed to create a subcutaneous pocket over the right pectoralis major muscle. The pocket was flushed with saline vigorously. There was adequate hemostasis. The port catheter was assembled and checked for leakage. The port catheter was secured in the pocket with two retention sutures. The tubing was tunneled subcutaneously to the right venotomy site and inserted into the SVC/RA junction through a valved peel-away sheath.  Position was confirmed with fluoroscopy. Images were obtained for documentation. The patient tolerated the procedure well. No immediate complications. Incisions were closed in a two layer fashion with 4 - 0 Vicryl suture. Dermabond was applied to the skin. The port catheter was accessed, blood was aspirated followed by saline and heparin flushes. Needle was removed. A dry sterile dressing was applied. IMPRESSION: Ultrasound and fluoroscopically guided right internal jugular single lumen power port catheter insertion. Tip in the SVC/RA junction. Catheter ready for use. Electronically Signed   By: Jerilynn Mages.  Shick M.D.   On: 08/23/2016 16:21   Ir Fluoro Guide Port Insertion Right  Result Date: 08/23/2016 CLINICAL DATA:  Lymphoma, access for chemotherapy EXAM: RIGHT INTERNAL JUGULAR SINGLE LUMEN POWER PORT CATHETER INSERTION Date:  5/11/20185/02/2017 4:19 pm Radiologist:  M. Daryll Brod, MD Guidance:  Ultrasound and fluoroscopic MEDICATIONS: Ancef 2 g; The antibiotic was administered within an appropriate time interval prior to skin puncture. ANESTHESIA/SEDATION: Versed 2.0 mg IV; Fentanyl 75 mcg IV; Moderate Sedation Time:  24 minutes The patient was continuously monitored during the procedure by the interventional radiology nurse under my direct supervision. FLUOROSCOPY TIME:  54 seconds (4  mGy) COMPLICATIONS: None immediate. CONTRAST:  None. PROCEDURE: Informed consent was obtained from the patient following explanation of the procedure, risks, benefits and alternatives. The patient understands, agrees and consents for the procedure. All questions were addressed. A time out was performed. Maximal barrier sterile technique utilized including caps, mask, sterile gowns, sterile gloves, large sterile drape, hand hygiene, and 2% chlorhexidine scrub. Under sterile conditions and local anesthesia, right internal jugular micropuncture venous access was performed. Access was performed with ultrasound. Images were obtained for  documentation. A guide wire was inserted followed by a transitional dilator. This allowed insertion of a guide wire and catheter into the IVC. Measurements were obtained from the SVC / RA junction back to the right IJ venotomy site. In the right infraclavicular chest, a subcutaneous pocket was created over the second anterior rib. This was done under sterile conditions and local anesthesia. 1% lidocaine with epinephrine was utilized for this. A 2.5 cm incision was made in the skin. Blunt dissection was performed to create a subcutaneous pocket over the right pectoralis major muscle. The pocket was flushed with saline vigorously. There was adequate hemostasis. The port catheter was assembled and checked for leakage. The port catheter was secured in the pocket with two retention sutures. The tubing was tunneled subcutaneously to the right venotomy site and inserted into the SVC/RA junction through a valved peel-away sheath. Position was confirmed with fluoroscopy. Images were obtained for documentation. The patient tolerated the procedure well. No immediate complications. Incisions were closed in a two layer fashion with 4 - 0 Vicryl suture. Dermabond was applied to the skin. The port catheter was accessed, blood was aspirated followed by saline and heparin flushes. Needle was removed. A dry sterile dressing was applied. IMPRESSION: Ultrasound and fluoroscopically guided right internal jugular single lumen power port catheter insertion. Tip in the SVC/RA junction. Catheter ready for use. Electronically Signed   By: Jerilynn Mages.  Shick M.D.   On: 08/23/2016 16:21        Scheduled Meds: . allopurinol  100 mg Oral BID  . aspirin  81 mg Oral Daily  . calcium-vitamin D  1 tablet Oral BID  . docusate sodium  100 mg Oral Q12H  . enoxaparin (LOVENOX) injection  40 mg Subcutaneous Q24H  . feeding supplement  1 Container Oral TID WC  . feeding supplement (ENSURE ENLIVE)  237 mL Oral BID BM  . losartan  25 mg Oral Daily  .  meloxicam  7.5 mg Oral Daily  . pantoprazole  20 mg Oral Daily  . polyethylene glycol  17 g Oral Daily  . predniSONE  50 mg Oral Q breakfast  . simvastatin  10 mg Oral QHS  . sucralfate  1 g Oral QID   Continuous Infusions: . dextrose 5 % and 0.9% NaCl 75 mL/hr at 08/23/16 1032     LOS: 3 days     Yanissa Michalsky Gerome Apley, MD [facility] Pager [phone]  If 7PM-7AM, please contact night-coverage [email or web address] Password [email or web address] 08/24/2016, 9:31 AM

## 2016-08-25 LAB — BASIC METABOLIC PANEL
Anion gap: 7 (ref 5–15)
BUN: 8 mg/dL (ref 6–20)
CO2: 28 mmol/L (ref 22–32)
CREATININE: 0.49 mg/dL (ref 0.44–1.00)
Calcium: 9.3 mg/dL (ref 8.9–10.3)
Chloride: 97 mmol/L — ABNORMAL LOW (ref 101–111)
Glucose, Bld: 111 mg/dL — ABNORMAL HIGH (ref 65–99)
Potassium: 3.9 mmol/L (ref 3.5–5.1)
SODIUM: 132 mmol/L — AB (ref 135–145)

## 2016-08-25 LAB — MAGNESIUM: MAGNESIUM: 1.4 mg/dL — AB (ref 1.7–2.4)

## 2016-08-25 NOTE — Progress Notes (Signed)
PROGRESS NOTE    Christina Peterson  MBT:[id number] DOB: [birth year] DOA: 08/21/2016 PCP: Julius Bowels, MD (Inactive)    Brief Narrative:  This is a 76 year old female who was recently diagnosed with non-Hodgkin's lymphoma, presents with abdominal pain, nausea, decreased appetite and weight loss. No fevers, no diarrhea or vomiting. On initial physical examination temperature 97.7, blood pressure 148/78, heart rate 72, respiratory rate 18, oxygen saturation 100% on room air, oral mucosa is dry, her lungs are clear to auscultation, heart S1-S2 present rhythmic, her abdomen is soft, no masses palpable, no peritoneal signs, no lower extremity edema. Sodium 121, potassium 4.1, chloride 80, bicarbonate 29, glucose 103, BUN 8, creatinine 0.64, calcium 10.2, lipase 23, AST 63, ALT 31, white count 9.4, hemoglobin 12.9, hematocrit 39.2, platelets 324. Urine analysis negative for infection, specific gravity 1.012  The patient wasadmitted to hospital with the working diagnosis of hypovolemic hypo-osmolar hyponatremia complicated with metabolic alkalosis in the setting of newly diagnosed non-Hodgkin's lymphoma. Responding to IV fluids. MRI with no lesions, echocardiogram with preserved LV systolic function but severe mitral regurgitation with mitral valve prolapse. Scheduled for chemotherapy 05/14.    Assessment & Plan:   Principal Problem:   Hyponatremia Active Problems:   Normocytic normochromic anemia   Essential hypertension   Abdominal pain   Renal mass, right   Severe protein-calorie malnutrition (HCC)   B-cell lymphoma (HCC)   Renal cell adenocarcinoma (HCC)   Severe mitral regurgitation: Per 2 D echo 08/23/2016   1. Hypovolemic, hypo-osmolar hyponatremia. Serum Na stable at 132, continue hydration with isotonic saline at 50 cc per hour. Patient tolerating po well, with no nausea or vomiting.   2. Metabolic alkalosis, chloride resistant. Suspected to be due to hypoproteinemia, will  continue to follow renal panel in am, serum bicarbonate at 28   3. Hypertension. Tolerating well losartan, with blood pressure systolic 536 to 468.   4. Non-Hodgkin's lymphoma. On prednisone and allopurinol, scheduled for first dose of chemotherapy on 5/14.    5. Dyslipidemia. Continue with simvastatin.   6. New diagnosed severe mitral regurgitation. No clinical signs of heart failure or arrhythmia, will continue IV fluids for now.  7. Severe protein calorie malnutrition. Continue  nutritional supplements.   DVT prophylaxis:enoxaparin  Code Status:full  Family Communication:I spoke with patient's family at the bedside and all questions were addressed.  Disposition Plan:home    Consultants:  Oncology   Procedures:    Antimicrobials:   Subjective: Patient feeling well, abdominal pain controlled with pain medications, no nausea or vomiting. Tolerating po well.   Objective: Vitals:   08/24/16 0520 08/24/16 1336 08/24/16 2059 08/25/16 0456  BP: 116/64 (!) 150/77 (!) 144/81 (!) 142/76  Pulse: (!) 58 61 69 64  Resp: 20 16 18 18   Temp: 98.7 F (37.1 C) 98 F (36.7 C) 98.4 F (36.9 C) 98.6 F (37 C)  TempSrc: Oral Oral Oral Oral  SpO2: 98% 99% 100% 99%  Height:        Intake/Output Summary (Last 24 hours) at 08/25/16 1138 Last data filed at 08/24/16 1857  Gross per 24 hour  Intake              990 ml  Output                0 ml  Net              990 ml   There were no vitals filed for this visit.  Examination:  General exam: Not  in pain or dyspnea E ENT: mild pallor, no icterus, oral mucosa moist.  Respiratory system: Clear to auscultation. Respiratory effort normal. No wheezing, rales or rhonchi.  Cardiovascular system: S1 & S2 heard, RRR. No JVD, murmurs, rubs, gallops or clicks. No pedal edema. Gastrointestinal system: Abdomen is nondistended, soft and nontender. No organomegaly or masses felt. Normal bowel sounds heard. Central nervous system:  Alert and oriented. No focal neurological deficits. Extremities: Symmetric 5 x 5 power. Skin: No rashes, lesions or ulcers   Data Reviewed: I have personally reviewed following labs and imaging studies  CBC:  Recent Labs Lab 08/21/16 1546 08/23/16 0347 08/24/16 0650  WBC 9.4 3.5* 7.8  NEUTROABS 8.0* 3.1 6.2  HGB 12.9 10.4* 10.1*  HCT 39.2 33.2* 31.0*  MCV 86.0 88.5 87.3  PLT 324 248 700   Basic Metabolic Panel:  Recent Labs Lab 08/21/16 1546 08/22/16 0347 08/23/16 0347 08/24/16 0650 08/25/16 0327  NA 121* 127* 131* 132* 132*  K 4.1 3.5 4.3 3.4* 3.9  CL 80* 89* 95* 97* 97*  CO2 29 30 29 28 28   GLUCOSE 103* 99 146* 88 111*  BUN 8 8 7 7 8   CREATININE 0.64 0.47 0.53 0.53 0.49  CALCIUM 10.2 9.4 9.4 9.5 9.3  MG  --   --   --   --  1.4*   GFR: CrCl cannot be calculated (Unknown ideal weight.). Liver Function Tests:  Recent Labs Lab 08/21/16 1546 08/22/16 0347  AST 63* 43*  ALT 31 23  ALKPHOS 121 102  BILITOT 1.6* 1.3*  PROT 7.8 6.1*  ALBUMIN 4.2 3.4*    Recent Labs Lab 08/21/16 1546  LIPASE 23   No results for input(s): AMMONIA in the last 168 hours. Coagulation Profile: No results for input(s): INR, PROTIME in the last 168 hours. Cardiac Enzymes: No results for input(s): CKTOTAL, CKMB, CKMBINDEX, TROPONINI in the last 168 hours. BNP (last 3 results) No results for input(s): PROBNP in the last 8760 hours. HbA1C: No results for input(s): HGBA1C in the last 72 hours. CBG: No results for input(s): GLUCAP in the last 168 hours. Lipid Profile: No results for input(s): CHOL, HDL, LDLCALC, TRIG, CHOLHDL, LDLDIRECT in the last 72 hours. Thyroid Function Tests: No results for input(s): TSH, T4TOTAL, FREET4, T3FREE, THYROIDAB in the last 72 hours. Anemia Panel: No results for input(s): VITAMINB12, FOLATE, FERRITIN, TIBC, IRON, RETICCTPCT in the last 72 hours. Sepsis Labs: No results for input(s): PROCALCITON, LATICACIDVEN in the last 168 hours.  No  results found for this or any previous visit (from the past 240 hour(s)).       Radiology Studies: Ir US Guide Vasc Access Right  Result Date: 08/23/2016 CLINICAL DATA:  Lymphoma, access for chemotherapy EXAM: RIGHT INTERNAL JUGULAR SINGLE LUMEN POWER PORT CATHETER INSERTION Date:  5/11/20185/02/2017 4:19 pm Radiologist:  M. Daryll Brod, MD Guidance:  Ultrasound and fluoroscopic MEDICATIONS: Ancef 2 g; The antibiotic was administered within an appropriate time interval prior to skin puncture. ANESTHESIA/SEDATION: Versed 2.0 mg IV; Fentanyl 75 mcg IV; Moderate Sedation Time:  24 minutes The patient was continuously monitored during the procedure by the interventional radiology nurse under my direct supervision. FLUOROSCOPY TIME:  54 seconds (4 mGy) COMPLICATIONS: None immediate. CONTRAST:  None. PROCEDURE: Informed consent was obtained from the patient following explanation of the procedure, risks, benefits and alternatives. The patient understands, agrees and consents for the procedure. All questions were addressed. A time out was performed. Maximal barrier sterile technique utilized including caps, mask, sterile gowns,  sterile gloves, large sterile drape, hand hygiene, and 2% chlorhexidine scrub. Under sterile conditions and local anesthesia, right internal jugular micropuncture venous access was performed. Access was performed with ultrasound. Images were obtained for documentation. A guide wire was inserted followed by a transitional dilator. This allowed insertion of a guide wire and catheter into the IVC. Measurements were obtained from the SVC / RA junction back to the right IJ venotomy site. In the right infraclavicular chest, a subcutaneous pocket was created over the second anterior rib. This was done under sterile conditions and local anesthesia. 1% lidocaine with epinephrine was utilized for this. A 2.5 cm incision was made in the skin. Blunt dissection was performed to create a subcutaneous  pocket over the right pectoralis major muscle. The pocket was flushed with saline vigorously. There was adequate hemostasis. The port catheter was assembled and checked for leakage. The port catheter was secured in the pocket with two retention sutures. The tubing was tunneled subcutaneously to the right venotomy site and inserted into the SVC/RA junction through a valved peel-away sheath. Position was confirmed with fluoroscopy. Images were obtained for documentation. The patient tolerated the procedure well. No immediate complications. Incisions were closed in a two layer fashion with 4 - 0 Vicryl suture. Dermabond was applied to the skin. The port catheter was accessed, blood was aspirated followed by saline and heparin flushes. Needle was removed. A dry sterile dressing was applied. IMPRESSION: Ultrasound and fluoroscopically guided right internal jugular single lumen power port catheter insertion. Tip in the SVC/RA junction. Catheter ready for use. Electronically Signed   By: Jerilynn Mages.  Shick M.D.   On: 08/23/2016 16:21   Ir Fluoro Guide Port Insertion Right  Result Date: 08/23/2016 CLINICAL DATA:  Lymphoma, access for chemotherapy EXAM: RIGHT INTERNAL JUGULAR SINGLE LUMEN POWER PORT CATHETER INSERTION Date:  5/11/20185/02/2017 4:19 pm Radiologist:  M. Daryll Brod, MD Guidance:  Ultrasound and fluoroscopic MEDICATIONS: Ancef 2 g; The antibiotic was administered within an appropriate time interval prior to skin puncture. ANESTHESIA/SEDATION: Versed 2.0 mg IV; Fentanyl 75 mcg IV; Moderate Sedation Time:  24 minutes The patient was continuously monitored during the procedure by the interventional radiology nurse under my direct supervision. FLUOROSCOPY TIME:  54 seconds (4 mGy) COMPLICATIONS: None immediate. CONTRAST:  None. PROCEDURE: Informed consent was obtained from the patient following explanation of the procedure, risks, benefits and alternatives. The patient understands, agrees and consents for the  procedure. All questions were addressed. A time out was performed. Maximal barrier sterile technique utilized including caps, mask, sterile gowns, sterile gloves, large sterile drape, hand hygiene, and 2% chlorhexidine scrub. Under sterile conditions and local anesthesia, right internal jugular micropuncture venous access was performed. Access was performed with ultrasound. Images were obtained for documentation. A guide wire was inserted followed by a transitional dilator. This allowed insertion of a guide wire and catheter into the IVC. Measurements were obtained from the SVC / RA junction back to the right IJ venotomy site. In the right infraclavicular chest, a subcutaneous pocket was created over the second anterior rib. This was done under sterile conditions and local anesthesia. 1% lidocaine with epinephrine was utilized for this. A 2.5 cm incision was made in the skin. Blunt dissection was performed to create a subcutaneous pocket over the right pectoralis major muscle. The pocket was flushed with saline vigorously. There was adequate hemostasis. The port catheter was assembled and checked for leakage. The port catheter was secured in the pocket with two retention sutures. The tubing was tunneled  subcutaneously to the right venotomy site and inserted into the SVC/RA junction through a valved peel-away sheath. Position was confirmed with fluoroscopy. Images were obtained for documentation. The patient tolerated the procedure well. No immediate complications. Incisions were closed in a two layer fashion with 4 - 0 Vicryl suture. Dermabond was applied to the skin. The port catheter was accessed, blood was aspirated followed by saline and heparin flushes. Needle was removed. A dry sterile dressing was applied. IMPRESSION: Ultrasound and fluoroscopically guided right internal jugular single lumen power port catheter insertion. Tip in the SVC/RA junction. Catheter ready for use. Electronically Signed   By: Jerilynn Mages.   Shick M.D.   On: 08/23/2016 16:21        Scheduled Meds: . allopurinol  100 mg Oral BID  . aspirin  81 mg Oral Daily  . calcium-vitamin D  1 tablet Oral BID  . docusate sodium  100 mg Oral Q12H  . enoxaparin (LOVENOX) injection  40 mg Subcutaneous Q24H  . feeding supplement  1 Container Oral TID WC  . feeding supplement (ENSURE ENLIVE)  237 mL Oral BID BM  . losartan  25 mg Oral Daily  . meloxicam  7.5 mg Oral Daily  . pantoprazole  20 mg Oral Daily  . polyethylene glycol  17 g Oral Daily  . predniSONE  50 mg Oral Q breakfast  . simvastatin  10 mg Oral QHS  . sucralfate  1 g Oral QID   Continuous Infusions: . dextrose 5 % and 0.9% NaCl 75 mL/hr at 08/25/16 0318     LOS: 4 days      Christina Newville Gerome Apley, MD [facility] Pager [phone]  If 7PM-7AM, please contact night-coverage [email or web address] Password [facility] 08/25/2016, 11:38 AM

## 2016-08-25 NOTE — Progress Notes (Signed)
PT Cancellation Note  Patient Details Name: Christina Peterson MRN: [id number] DOB: 05/03/1940   Cancelled Treatment:    Reason Eval/Treat Not Completed: PT screened, no needs identified, will sign off. Pt ambulated 400' in hall with family earlier today, reports no deficits with mobility.    Blondell Reveal Kistler 08/25/2016, 12:41 PM [phone]

## 2016-08-25 NOTE — Progress Notes (Signed)
Chemotherapy teaching reviewed with patient.  Family not at bedside at this time. Rn reviewed side effects of chemotherapy regimen including but not limited to decreased blood counts, changes in bowel habits, appetite, hair loss, bladder irritation, as well as possible infusion related reactions.  Patient has multiple resources at bedside.  All questions were answered and RN encouraged pt to read information provided when  her daughter visits tonight.  RN reinforced that staff is available to answer any questions patient or family may have.

## 2016-08-26 DIAGNOSIS — E876 Hypokalemia: Secondary | ICD-10-CM

## 2016-08-26 LAB — BASIC METABOLIC PANEL
Anion gap: 8 (ref 5–15)
BUN: 6 mg/dL (ref 6–20)
CO2: 31 mmol/L (ref 22–32)
Calcium: 9.5 mg/dL (ref 8.9–10.3)
Chloride: 94 mmol/L — ABNORMAL LOW (ref 101–111)
Creatinine, Ser: 0.44 mg/dL (ref 0.44–1.00)
GFR calc Af Amer: >60 mL/min (ref >60)
GLUCOSE: 91 mg/dL (ref 65–99)
POTASSIUM: 3.3 mmol/L — AB (ref 3.5–5.1)
Sodium: 133 mmol/L — ABNORMAL LOW (ref 135–145)

## 2016-08-26 MED ORDER — EPINEPHRINE PF 1 MG/ML IJ SOLN
0.5000 mg | Freq: Once | INTRAMUSCULAR | Status: DC | PRN
  Filled 2016-08-26: qty 1

## 2016-08-26 MED ORDER — SODIUM CHLORIDE 0.9% FLUSH: 3.0000 mL | INTRAVENOUS | Status: DC | PRN

## 2016-08-26 MED ORDER — ALBUTEROL SULFATE (2.5 MG/3ML) 0.083% IN NEBU: 2.5000 mg | INHALATION_SOLUTION | Freq: Once | RESPIRATORY_TRACT | Status: DC | PRN

## 2016-08-26 MED ORDER — SODIUM CHLORIDE 0.9% FLUSH: 10.0000 mL | INTRAVENOUS | Status: DC | PRN

## 2016-08-26 MED ORDER — HOT PACK MISC ONCOLOGY
1.0000 | Freq: Once | Status: AC | PRN
  Filled 2016-08-26: qty 1

## 2016-08-26 MED ORDER — ALTEPLASE 2 MG IJ SOLR
2.0000 mg | Freq: Once | INTRAMUSCULAR | Status: DC | PRN
  Filled 2016-08-26: qty 2

## 2016-08-26 MED ORDER — METHYLPREDNISOLONE SODIUM SUCC 125 MG IJ SOLR: 125.0000 mg | Freq: Once | INTRAMUSCULAR | Status: DC | PRN

## 2016-08-26 MED ORDER — EPINEPHRINE PF 1 MG/10ML IJ SOSY: 0.2500 mg | PREFILLED_SYRINGE | Freq: Once | INTRAMUSCULAR | Status: DC | PRN

## 2016-08-26 MED ORDER — SODIUM CHLORIDE 0.9 % IV SOLN
Freq: Once | INTRAVENOUS | Status: AC | PRN
Start: 2016-08-26 — End: 2016-08-26
  Administered 2016-08-26: 12:00:00 via INTRAVENOUS

## 2016-08-26 MED ORDER — SODIUM CHLORIDE 0.9 % IV SOLN
10.0000 mg | Freq: Once | INTRAVENOUS | Status: AC
  Administered 2016-08-26: 10 mg via INTRAVENOUS
  Filled 2016-08-26: qty 1

## 2016-08-26 MED ORDER — DIPHENHYDRAMINE HCL 50 MG/ML IJ SOLN: 25.0000 mg | Freq: Once | INTRAMUSCULAR | Status: DC | PRN

## 2016-08-26 MED ORDER — FAMOTIDINE IN NACL 20-0.9 MG/50ML-% IV SOLN
20.0000 mg | Freq: Once | INTRAVENOUS | Status: DC | PRN
  Filled 2016-08-26: qty 50

## 2016-08-26 MED ORDER — HEPARIN SOD (PORK) LOCK FLUSH 100 UNIT/ML IV SOLN
500.0000 [IU] | Freq: Once | INTRAVENOUS | Status: DC | PRN
  Filled 2016-08-26: qty 5

## 2016-08-26 MED ORDER — DIPHENHYDRAMINE HCL 50 MG PO CAPS
50.0000 mg | ORAL_CAPSULE | Freq: Once | ORAL | Status: AC
  Administered 2016-08-26: 50 mg via ORAL
  Filled 2016-08-26: qty 1

## 2016-08-26 MED ORDER — CYCLOPHOSPHAMIDE CHEMO INJECTION 1 GM
500.0000 mg/m2 | Freq: Once | INTRAMUSCULAR | Status: AC
  Administered 2016-08-26: 720 mg via INTRAVENOUS
  Filled 2016-08-26: qty 36

## 2016-08-26 MED ORDER — SODIUM CHLORIDE 0.9 % IV SOLN: Freq: Once | INTRAVENOUS | Status: AC

## 2016-08-26 MED ORDER — SODIUM CHLORIDE 0.9 % IV SOLN
2.0000 mg | Freq: Once | INTRAVENOUS | Status: AC
  Administered 2016-08-26: 2 mg via INTRAVENOUS
  Filled 2016-08-26: qty 2

## 2016-08-26 MED ORDER — RITUXIMAB CHEMO INJECTION 500 MG/50ML
375.0000 mg/m2 | Freq: Once | INTRAVENOUS | Status: AC
  Administered 2016-08-26: 500 mg via INTRAVENOUS
  Filled 2016-08-26: qty 50

## 2016-08-26 MED ORDER — HEPARIN SOD (PORK) LOCK FLUSH 100 UNIT/ML IV SOLN: 250.0000 [IU] | Freq: Once | INTRAVENOUS | Status: DC | PRN

## 2016-08-26 MED ORDER — COLD PACK MISC ONCOLOGY
1.0000 | Freq: Once | Status: AC | PRN
  Filled 2016-08-26: qty 1

## 2016-08-26 MED ORDER — POTASSIUM CHLORIDE CRYS ER 20 MEQ PO TBCR
40.0000 meq | EXTENDED_RELEASE_TABLET | Freq: Once | ORAL | Status: AC
  Administered 2016-08-26: 40 meq via ORAL
  Filled 2016-08-26: qty 2

## 2016-08-26 MED ORDER — DOXORUBICIN HCL CHEMO IV INJECTION 2 MG/ML
25.0000 mg/m2 | Freq: Once | INTRAVENOUS | Status: AC
  Administered 2016-08-26: 36 mg via INTRAVENOUS
  Filled 2016-08-26: qty 18

## 2016-08-26 MED ORDER — ACETAMINOPHEN 325 MG PO TABS
650.0000 mg | ORAL_TABLET | Freq: Once | ORAL | Status: AC
  Administered 2016-08-26: 650 mg via ORAL
  Filled 2016-08-26: qty 2

## 2016-08-26 MED ORDER — PALONOSETRON HCL INJECTION 0.25 MG/5ML
0.2500 mg | Freq: Once | INTRAVENOUS | Status: AC
  Administered 2016-08-26: 0.25 mg via INTRAVENOUS
  Filled 2016-08-26: qty 5

## 2016-08-26 MED ORDER — DIPHENHYDRAMINE HCL 50 MG/ML IJ SOLN: 50.0000 mg | Freq: Once | INTRAMUSCULAR | Status: DC | PRN

## 2016-08-26 NOTE — Progress Notes (Signed)
[name]   HEMATOLOGY/ONCOLOGY INPATIENT PROGRESS NOTE  Date of Service: 08/26/2016  Inpatient Attending: .Arrien, Jimmy Picket,*   SUBJECTIVE   Patient was seen this morning with her daughter at bedside. She notes no acute new symptoms overnight. Still with some belly pain. Eating a little better after starting on prednisone. No fevers no chills no night sweats. Had chemotherapy counseling with the nurse educator on Friday. Echocardiogram shows normal ejection fraction but does show significant mitral regurgitation. I discussed that we would recommend cardiology evaluation as outpatient.   OBJECTIVE:  NAD  PHYSICAL EXAMINATION: . Vitals:   08/25/16 1424 08/25/16 2058 08/25/16 2107 08/26/16 0420  BP: (!) 141/73 (!) 164/86  (!) 162/73  Pulse: (!) 57 90  65  Resp: 18 18  18   Temp: 98.1 F (36.7 C) 98.3 F (36.8 C)  98.7 F (37.1 C)  TempSrc: Oral Oral  Oral  SpO2: 100% 98%  99%  Weight:   118 lb (53.5 kg)   Height:       Filed Weights   08/25/16 2107  Weight: 118 lb (53.5 kg)   .Body mass index is 22.3 kg/m.  GENERAL:alert, in no acute distress and comfortable SKIN: no acute rashes, no significant lesions EYES: conjunctiva are pink and non-injected, sclera anicteric OROPHARYNX: MMM, no exudates, no oropharyngeal erythema or ulceration NECK: supple, no JVD LYMPH:  b/l palpable supraclavicular LN's, no palpable cervical LNadenopathy. LUNGS: clear to auscultation b/l with normal respiratory effort HEART: regular rate & rhythm, 3 x 6 systolic murmur over the sternum and aortic area ? Aortic stenosis ABDOMEN:  normoactive bowel sounds , non tender, not distended. Extremity: no pedal edema PSYCH: alert & oriented x 3 with fluent speech NEURO: no focal motor/sensory deficits  MEDICAL HISTORY:  Past Medical History:  Diagnosis Date  . Arthritis   . Hypercholesteremia   . Hypertension    FOLLOWED BY DR Katherine Roan    SURGICAL HISTORY: Past Surgical History:  Procedure  Laterality Date  . ABDOMINAL HYSTERECTOMY    . IR FLUORO GUIDE PORT INSERTION RIGHT  08/23/2016  . IR US GUIDE VASC ACCESS RIGHT  08/23/2016  . JOINT REPLACEMENT    . 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 TOTAL HIP REVISION  05/02/2011   Procedure: TOTAL HIP REVISION;  Surgeon: Sharmon Revere, MD;  Location: [place];  Service: Orthopedics;  Laterality: Left;  left total hip revision with bone grafting    SOCIAL HISTORY: Social History   Social History  . Marital status: Legally Separated    Spouse name: N/A  . Number of children: N/A  . Years of education: N/A   Occupational History  . Not on file.   Social History Main Topics  . Smoking status: Never Smoker  . Smokeless tobacco: Never Used  . Alcohol use No  . Drug use: No  . Sexual activity: Not on file   Other Topics Concern  . Not on file   Social History Narrative  . No narrative on file    FAMILY HISTORY: History reviewed. No pertinent family history.  ALLERGIES:  has No Known Allergies.  MEDICATIONS:  Scheduled Meds: . allopurinol  100 mg Oral BID  . aspirin  81 mg Oral Daily  . calcium-vitamin D  1 tablet Oral BID  . docusate sodium  100 mg Oral Q12H  . enoxaparin (LOVENOX) injection  40 mg Subcutaneous Q24H  . feeding supplement  1 Container Oral TID WC  . feeding supplement (ENSURE  ENLIVE)  237 mL Oral BID BM  . losartan  25 mg Oral Daily  . meloxicam  7.5 mg Oral Daily  . pantoprazole  20 mg Oral Daily  . polyethylene glycol  17 g Oral Daily  . predniSONE  50 mg Oral Q breakfast  . simvastatin  10 mg Oral QHS  . sucralfate  1 g Oral QID   Continuous Infusions: . dextrose 5 % and 0.9% NaCl 50 mL/hr at 08/25/16 1809   PRN Meds:.acetaminophen **OR** acetaminophen, morphine injection, ondansetron **OR** ondansetron (ZOFRAN) IV, oxyCODONE-acetaminophen  REVIEW OF SYSTEMS:    10 Point review of Systems was done is negative except as noted above.   LABORATORY DATA:  I have reviewed the data as  listed  . CBC Latest Ref Rng & Units 08/24/2016 08/23/2016 08/21/2016  WBC 4.0 - 10.5 K/uL 7.8 3.5(L) 9.4  Hemoglobin 12.0 - 15.0 g/dL 10.1(L) 10.4(L) 12.9  Hematocrit 36.0 - 46.0 % 31.0(L) 33.2(L) 39.2  Platelets 150 - 400 K/uL 245 248 324    . CMP Latest Ref Rng & Units 08/26/2016 08/25/2016 08/24/2016  Glucose 65 - 99 mg/dL 91 111(H) 88  BUN 6 - 20 mg/dL 6 8 7   Creatinine 0.44 - 1.00 mg/dL 0.44 0.49 0.53  Sodium 135 - 145 mmol/L 133(L) 132(L) 132(L)  Potassium 3.5 - 5.1 mmol/L 3.3(L) 3.9 3.4(L)  Chloride 101 - 111 mmol/L 94(L) 97(L) 97(L)  CO2 22 - 32 mmol/L 31 28 28   Calcium 8.9 - 10.3 mg/dL 9.5 9.3 9.5  Total Protein 6.5 - 8.1 g/dL - - -  Total Bilirubin 0.3 - 1.2 mg/dL - - -  Alkaline Phos 38 - 126 U/L - - -  AST 15 - 41 U/L - - -  ALT 14 - 54 U/L - - -     RADIOGRAPHIC STUDIES: I have personally reviewed the radiological images as listed and agreed with the findings in the report. Dg Abd 1 View  Result Date: 08/11/2016 CLINICAL DATA:  Nausea, constipation EXAM: ABDOMEN - 1 VIEW COMPARISON:  None. FINDINGS: The bowel gas pattern is normal. Phleboliths are noted within the pelvis. There is lower lumbar degenerative disc and facet arthropathy from approximately L4 caudad with osteoarthritis of the sacroiliac joints bilaterally and pubic symphysis. The partially visualized left total hip arthroplasty is noted with the acetabular component placed slightly more cephalad than the normal expected location of the left acetabulum. 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 The right hip demonstrates native joint space narrowing. IMPRESSION: No significant stool burden. No bowel obstruction. 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 Electronically Signed   By: Ashley Royalty M.D.   On: 08/11/2016 03:15   Ct Chest W  Contrast  Result Date: 08/12/2016 CLINICAL DATA:  Mid abdominal pain for 1 month. Worse with eating. MRI demonstrating adenopathy. EXAM: CT CHEST, ABDOMEN, AND PELVIS WITH CONTRAST TECHNIQUE: Multidetector CT imaging of the chest, abdomen and pelvis was performed following the standard protocol during bolus administration of intravenous contrast. CONTRAST:  83mL ISOVUE-300 IOPAMIDOL (ISOVUE-300) INJECTION 61% COMPARISON:  08/09/2016 abdominal MRI. FINDINGS: CT CHEST FINDINGS Cardiovascular: Tortuous thoracic aorta. Moderate cardiomegaly, without pericardial effusion. No central pulmonary embolism, on this non-dedicated study. Mediastinum/Nodes: No supraclavicular adenopathy. Subcentimeter right thyroid nodule is nonspecific. No middle mediastinal or hilar adenopathy in. Necrotic adenopathy in the prevascular space measures 2.2 by 2.0 cm  on image 20/series 2. Lungs/Pleura: Small bilateral pleural effusions. Mild bibasilar atelectasis. Musculoskeletal: No acute osseous abnormality. Bilateral glenohumeral joint osteoarthritis. CT ABDOMEN PELVIS FINDINGS Hepatobiliary: Normal liver. Normal gallbladder, without biliary ductal dilatation. Pancreas: Pancreatic atrophy. Pancreatic head displaced anteriorly by portacaval adenopathy. No pancreatic duct dilatation. Spleen: Hypoattenuating splenic lesions, larger of which measures 2.1 cm. No splenomegaly. Adrenals/Urinary Tract: Normal adrenal glands. Heterogeneous lower pole right renal exophytic 3.6 cm mass. Normal left kidney. No hydronephrosis. Degraded evaluation of the pelvis, secondary to beam hardening artifact from left hip arthroplasty. Grossly normal urinary bladder. Stomach/Bowel: Gastric antrum displaced anteriorly by abdominal adenopathy. No obstruction. Normal colon and terminal ileum. Normal small bowel caliber. Vascular/Lymphatic: Aortic and branch vessel atherosclerosis. Extensive abdominal adenopathy. Nodal mass in the portal caval space measures 4.4 x 5.6  cm on image 57/series 2. Retroperitoneal adenopathy, with a necrotic retrocaval node measuring 3.3 x 4.2 cm on image 66/series 2. Extensive small bowel mesenteric adenopathy. Pelvic sidewalls not well evaluated. Reproductive: Hysterectomy.  No gross adnexal mass. Other: No significant free fluid. Musculoskeletal: Left hip arthroplasty.  Right hip osteoarthritis. IMPRESSION: 1. Adenopathy within the chest and abdomen, most consistent with lymphoma. 2. Degraded evaluation of the pelvis, secondary to beam hardening artifact from left hip arthroplasty 3. Right renal mass, suspicious for renal cell carcinoma. Please see recent abdominal MRI for further description. 4. Small bilateral pleural effusions. 5.  Aortic atherosclerosis. Electronically Signed   By: Abigail Miyamoto M.D.   On: 08/12/2016 13:30   Mr Jeri Cos PN Contrast  Result Date: 08/22/2016 CLINICAL DATA:  Non-Hodgkin's lymphoma, abdominal pain, and weight loss. EXAM: MRI HEAD WITHOUT AND WITH CONTRAST TECHNIQUE: Multiplanar, multiecho pulse sequences of the brain and surrounding structures were obtained without and with intravenous contrast. CONTRAST:  57mL MULTIHANCE GADOBENATE DIMEGLUMINE 529 MG/ML IV SOLN COMPARISON:  None. FINDINGS: Brain: No evidence for acute infarction, hemorrhage, mass lesion, hydrocephalus, or extra-axial fluid. Mild atrophy. Mild subcortical and periventricular T2 and FLAIR hyperintensities, likely chronic microvascular ischemic change. Post infusion, no abnormal enhancement of the brain or meninges. Partial empty sella. Vascular: Normal flow voids. Skull and upper cervical spine: Normal marrow signal. Cervical spondylosis. Sinuses/Orbits: Chronic sinus disease. No acute orbital findings or masses. Other: None. IMPRESSION: Mild atrophy and small vessel disease. No acute intracranial findings. No abnormal postcontrast enhancement or osseous changes to suggest lymphoma within or surrounding the visualized CNS. Electronically Signed    By: Staci Righter M.D.   On: 08/22/2016 18:47   Mr Abdomen W Or Wo Contrast  Result Date: 08/09/2016 CLINICAL DATA:  Abdominal mass. Abdominal pain. Nausea and vomiting. EXAM: MRI ABDOMEN WITHOUT AND WITH CONTRAST TECHNIQUE: Multiplanar multisequence MR imaging of the abdomen was performed both before and after the administration of intravenous contrast. CONTRAST:  41mL MULTIHANCE GADOBENATE DIMEGLUMINE 529 MG/ML IV SOLN COMPARISON:  Ultrasound 08/09/2016 FINDINGS: Lower chest: Lung bases are clear. Hepatobiliary: No focal hepatic lesion. Large nodal mass in the porta hepatis measuring 6.0 x 4.2 cm is most consistent with large periportal lymph node. There is no biliary duct dilatation. Gallbladder normal. Common bile duct is difficult to follow. Portal veins are patent. Pancreas: Pancreas is difficult to evaluate. There is bulky adenopathy within the mesenteries adjacent the pancreas which makes defining the pancreatic tissue difficult. No discrete pancreatic lesion is identified. Spleen: Single 1.6 cm lesion within the spleen. Cannot exclude lymphoma lesion within the spleen. Spleen is normal volume.) Adrenals/urinary tract: Adrenal glands are normal. Along the lateral cortex of the inferior RIGHT kidney  there is a 3.4 x 4.0 cm enhancing mass (image 55, series 1501. This is clearly depicted on T2 weighted image 34, series 3. Stomach/Bowel: Stomach, small-bowel in limited view the: Unremarkable. No bowel obstruction Vascular/Lymphatic: Abdominal aorta normal caliber. There is bulky adenopathy within the small bowel mesenteries centrally. Coronal image 12 series 10 and demonstrates no masses well. Mass lesions also demonstrated well on the eighth diffusion-weighted Ob 500 series. Example noted the LEFT upper abdomen measures 3.1 cm short axis. Course periportal nodes again demonstrated. Retroperitoneal nodes LEFT of the aorta measure 1.6 cm short axis (image 51, series 4. Likely large. Caval lymph node measuring  3.0 cm (image 54, series 4 Other: No free fluid. Musculoskeletal: No aggressive osseous lesion. IMPRESSION: 1. 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 Retroperitoneal adenopathy extends along the aorta and IVC 2. Indeterminate lesion within normal volume spleen. 3. Recommend oncology consultation and FDG PET scan for further staging and biopsy planning. 4. Additional 4 cm lesion exophytic from the inferior pole the RIGHT kidney is concerning for RENAL CELL CARCINOMA. Electronically Signed   By: Suzy Bouchard M.D.   On: 08/09/2016 12:35   US Abdomen Complete  Result Date: 08/09/2016 CLINICAL DATA:  Abdominal pain for 3 months EXAM: ABDOMEN ULTRASOUND COMPLETE COMPARISON:  None. FINDINGS: Gallbladder: No gallstones or wall thickening visualized. No sonographic Murphy sign noted by sonographer. Common bile duct: Diameter: Normal caliber, 2 mm Liver: There is a 5.9 x 4.8 x 4.6 cm mixed echotexture solid lesion in the left hepatic lobe. Overall, the lesion is hypoechoic. This is nonspecific and warrants additional characterization. IVC: No abnormality visualized. Pancreas: Enlarged hypoechoic mass in the pancreatic tail measures 8.3 x 7.8 x 5.9 cm. Hypoechoic mass in the region the pancreatic head measures 3.2 x 2.2 x 4.5 cm. Spleen: Normal size. Hypoechoic lesion centrally within the spleen measures 1.9 x 1.5 x 1.3 cm. Right Kidney: Length: 8.1 cm. No hydronephrosis. There is a solid mass off the lower pole of the right kidney measuring 3.4 x 3.3 x 3.2 cm. 1.6 cm exophytic cyst off the midpole. Left Kidney: Length: 9.8 cm. Echogenicity within normal limits. No mass or hydronephrosis visualized. Abdominal aorta: No aneurysm visualized. Other findings: Within the right lower quadrant, there is a solid mass measuring 5.8 x 3.7 x 3.2 cm of unknown etiology or source. This is adjacent to the aorta. IMPRESSION: Numerous solid organ abnormalities. 5.9 cm hypoechoic solid mass in  the left hepatic lobe. At least 2 large masses in the pancreas, both pancreatic head and pancreatic tail. Hypoechoic lesions centrally within the spleen. 3.4 cm solid mass off the lower pole of the right kidney. In addition, there is a large soft tissue mass in the right lower quadrant of the abdomen adjacent to the aorta measuring up to 5.8 cm. These areas are concerning for malignancy and warrant further evaluation. Recommend MRI of the abdomen without and with contrast for further evaluation. Electronically Signed   By: Rolm Baptise M.D.   On: 08/09/2016 09:24   Ct Abdomen Pelvis W Contrast  Result Date: 08/12/2016 CLINICAL DATA:  Mid abdominal pain for 1 month. Worse with eating. MRI demonstrating adenopathy. EXAM: CT CHEST, ABDOMEN, AND PELVIS WITH CONTRAST TECHNIQUE: Multidetector CT imaging of the chest, abdomen and pelvis was performed following the standard protocol during bolus administration of intravenous contrast. CONTRAST:  6mL ISOVUE-300 IOPAMIDOL (ISOVUE-300) INJECTION 61% COMPARISON:  08/09/2016 abdominal MRI. FINDINGS: CT CHEST FINDINGS Cardiovascular: Tortuous thoracic aorta. Moderate cardiomegaly, without  pericardial effusion. No central pulmonary embolism, on this non-dedicated study. Mediastinum/Nodes: No supraclavicular adenopathy. Subcentimeter right thyroid nodule is nonspecific. No middle mediastinal or hilar adenopathy in. Necrotic adenopathy in the prevascular space measures 2.2 by 2.0 cm on image 20/series 2. Lungs/Pleura: Small bilateral pleural effusions. Mild bibasilar atelectasis. Musculoskeletal: No acute osseous abnormality. Bilateral glenohumeral joint osteoarthritis. CT ABDOMEN PELVIS FINDINGS Hepatobiliary: Normal liver. Normal gallbladder, without biliary ductal dilatation. Pancreas: Pancreatic atrophy. Pancreatic head displaced anteriorly by portacaval adenopathy. No pancreatic duct dilatation. Spleen: Hypoattenuating splenic lesions, larger of which measures 2.1 cm. No  splenomegaly. Adrenals/Urinary Tract: Normal adrenal glands. Heterogeneous lower pole right renal exophytic 3.6 cm mass. Normal left kidney. No hydronephrosis. Degraded evaluation of the pelvis, secondary to beam hardening artifact from left hip arthroplasty. Grossly normal urinary bladder. Stomach/Bowel: Gastric antrum displaced anteriorly by abdominal adenopathy. No obstruction. Normal colon and terminal ileum. Normal small bowel caliber. Vascular/Lymphatic: Aortic and branch vessel atherosclerosis. Extensive abdominal adenopathy. Nodal mass in the portal caval space measures 4.4 x 5.6 cm on image 57/series 2. Retroperitoneal adenopathy, with a necrotic retrocaval node measuring 3.3 x 4.2 cm on image 66/series 2. Extensive small bowel mesenteric adenopathy. Pelvic sidewalls not well evaluated. Reproductive: Hysterectomy.  No gross adnexal mass. Other: No significant free fluid. Musculoskeletal: Left hip arthroplasty.  Right hip osteoarthritis. IMPRESSION: 1. Adenopathy within the chest and abdomen, most consistent with lymphoma. 2. Degraded evaluation of the pelvis, secondary to beam hardening artifact from left hip arthroplasty 3. Right renal mass, suspicious for renal cell carcinoma. Please see recent abdominal MRI for further description. 4. Small bilateral pleural effusions. 5.  Aortic atherosclerosis. Electronically Signed   By: Abigail Miyamoto M.D.   On: 08/12/2016 13:30   Ir US Guide Vasc Access Right  Result Date: 08/23/2016 CLINICAL DATA:  Lymphoma, access for chemotherapy EXAM: RIGHT INTERNAL JUGULAR SINGLE LUMEN POWER PORT CATHETER INSERTION Date:  5/11/20185/02/2017 4:19 pm Radiologist:  M. Daryll Brod, MD Guidance:  Ultrasound and fluoroscopic MEDICATIONS: Ancef 2 g; The antibiotic was administered within an appropriate time interval prior to skin puncture. ANESTHESIA/SEDATION: Versed 2.0 mg IV; Fentanyl 75 mcg IV; Moderate Sedation Time:  24 minutes The patient was continuously monitored during the  procedure by the interventional radiology nurse under my direct supervision. FLUOROSCOPY TIME:  54 seconds (4 mGy) COMPLICATIONS: None immediate. CONTRAST:  None. PROCEDURE: Informed consent was obtained from the patient following explanation of the procedure, risks, benefits and alternatives. The patient understands, agrees and consents for the procedure. All questions were addressed. A time out was performed. Maximal barrier sterile technique utilized including caps, mask, sterile gowns, sterile gloves, large sterile drape, hand hygiene, and 2% chlorhexidine scrub. Under sterile conditions and local anesthesia, right internal jugular micropuncture venous access was performed. Access was performed with ultrasound. Images were obtained for documentation. A guide wire was inserted followed by a transitional dilator. This allowed insertion of a guide wire and catheter into the IVC. Measurements were obtained from the SVC / RA junction back to the right IJ venotomy site. In the right infraclavicular chest, a subcutaneous pocket was created over the second anterior rib. This was done under sterile conditions and local anesthesia. 1% lidocaine with epinephrine was utilized for this. A 2.5 cm incision was made in the skin. Blunt dissection was performed to create a subcutaneous pocket over the right pectoralis major muscle. The pocket was flushed with saline vigorously. There was adequate hemostasis. The port catheter was assembled and checked for leakage. The port catheter was secured  in the pocket with two retention sutures. The tubing was tunneled subcutaneously to the right venotomy site and inserted into the SVC/RA junction through a valved peel-away sheath. Position was confirmed with fluoroscopy. Images were obtained for documentation. The patient tolerated the procedure well. No immediate complications. Incisions were closed in a two layer fashion with 4 - 0 Vicryl suture. Dermabond was applied to the skin. The  port catheter was accessed, blood was aspirated followed by saline and heparin flushes. Needle was removed. A dry sterile dressing was applied. IMPRESSION: Ultrasound and fluoroscopically guided right internal jugular single lumen power port catheter insertion. Tip in the SVC/RA junction. Catheter ready for use. Electronically Signed   By: Jerilynn Mages.  Shick M.D.   On: 08/23/2016 16:21   Ct Biopsy  Result Date: 08/14/2016 INDICATION: No known primary, now with indeterminate right-sided renal lesion and retroperitoneal lymphadenopathy. Please perform CT-guided biopsy for tissue diagnostic purposes. EXAM: CT-GUIDED BIOPSY OF INDETERMINATE RIGHT-SIDED RETROPERITONEAL LYMPHADENOPATHY. COMPARISON:  CT the chest, abdomen and pelvis - 07/16/2016 MEDICATIONS: None. ANESTHESIA/SEDATION: Fentanyl 100 mcg IV; Versed 2 mg IV Sedation time: 15 minutes; The patient was continuously monitored during the procedure by the interventional radiology nurse under my direct supervision. CONTRAST:  None. COMPLICATIONS: None immediate. PROCEDURE: Informed consent was obtained from the patient following an explanation of the procedure, risks, benefits and alternatives. A time out was performed prior to the initiation of the procedure. The patient was positioned prone on the CT table and a limited CT was performed for procedural planning demonstrating unchanged size and appearance of the dominant right-sided retroperitoneal nodal conglomeration with dominant component measuring approximately 3.5 x 2.9 cm (image 25, series 3). The procedure was planned. The operative site was prepped and draped in the usual sterile fashion. Appropriate trajectory was confirmed with a 22 gauge spinal needle after the adjacent tissues were anesthetized with 1% Lidocaine with epinephrine. Under intermittent CT guidance, a 17 gauge coaxial needle was advanced into the peripheral aspect of the mass. Appropriate positioning was confirmed and 5 core needle biopsy samples  were obtained with an 18 gauge core needle biopsy device. The co-axial needle was removed and hemostasis was achieved with manual compression. A limited postprocedural CT was negative for hemorrhage or additional complication. A dressing was placed. The patient tolerated the procedure well without immediate postprocedural complication. IMPRESSION: Technically successful CT guided core needle biopsy of indeterminate right-sided retroperitoneal nodal conglomeration. Electronically Signed   By: Sandi Mariscal M.D.   On: 08/14/2016 13:50   Ir Fluoro Guide Port Insertion Right  Result Date: 08/23/2016 CLINICAL DATA:  Lymphoma, access for chemotherapy EXAM: RIGHT INTERNAL JUGULAR SINGLE LUMEN POWER PORT CATHETER INSERTION Date:  5/11/20185/02/2017 4:19 pm Radiologist:  M. Daryll Brod, MD Guidance:  Ultrasound and fluoroscopic MEDICATIONS: Ancef 2 g; The antibiotic was administered within an appropriate time interval prior to skin puncture. ANESTHESIA/SEDATION: Versed 2.0 mg IV; Fentanyl 75 mcg IV; Moderate Sedation Time:  24 minutes The patient was continuously monitored during the procedure by the interventional radiology nurse under my direct supervision. FLUOROSCOPY TIME:  54 seconds (4 mGy) COMPLICATIONS: None immediate. CONTRAST:  None. PROCEDURE: Informed consent was obtained from the patient following explanation of the procedure, risks, benefits and alternatives. The patient understands, agrees and consents for the procedure. All questions were addressed. A time out was performed. Maximal barrier sterile technique utilized including caps, mask, sterile gowns, sterile gloves, large sterile drape, hand hygiene, and 2% chlorhexidine scrub. Under sterile conditions and local anesthesia, right internal jugular micropuncture  venous access was performed. Access was performed with ultrasound. Images were obtained for documentation. A guide wire was inserted followed by a transitional dilator. This allowed insertion of a  guide wire and catheter into the IVC. Measurements were obtained from the SVC / RA junction back to the right IJ venotomy site. In the right infraclavicular chest, a subcutaneous pocket was created over the second anterior rib. This was done under sterile conditions and local anesthesia. 1% lidocaine with epinephrine was utilized for this. A 2.5 cm incision was made in the skin. Blunt dissection was performed to create a subcutaneous pocket over the right pectoralis major muscle. The pocket was flushed with saline vigorously. There was adequate hemostasis. The port catheter was assembled and checked for leakage. The port catheter was secured in the pocket with two retention sutures. The tubing was tunneled subcutaneously to the right venotomy site and inserted into the SVC/RA junction through a valved peel-away sheath. Position was confirmed with fluoroscopy. Images were obtained for documentation. The patient tolerated the procedure well. No immediate complications. Incisions were closed in a two layer fashion with 4 - 0 Vicryl suture. Dermabond was applied to the skin. The port catheter was accessed, blood was aspirated followed by saline and heparin flushes. Needle was removed. A dry sterile dressing was applied. IMPRESSION: Ultrasound and fluoroscopically guided right internal jugular single lumen power port catheter insertion. Tip in the SVC/RA junction. Catheter ready for use. Electronically Signed   By: Jerilynn Mages.  Shick M.D.   On: 08/23/2016 16:21    ASSESSMENT & PLAN:     RADIOGRAPHIC STUDIES: I have personally reviewed the radiological images as listed and agreed with the findings in the report.  Imaging Results  Dg Abd 1 View  Result Date: 08/11/2016 CLINICAL DATA:  Nausea, constipation EXAM: ABDOMEN - 1 VIEW COMPARISON:  None. FINDINGS: The bowel gas pattern is normal. Phleboliths are noted within the pelvis. There is lower lumbar degenerative disc and facet arthropathy from approximately L4  caudad with osteoarthritis of the sacroiliac joints bilaterally and pubic symphysis. The partially visualized left total hip arthroplasty is noted with the acetabular component placed slightly more cephalad than the normal expected location of the left acetabulum. 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 The right hip demonstrates native joint space narrowing. IMPRESSION: No significant stool burden. No bowel obstruction. 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 Electronically Signed   By: Ashley Royalty M.D.   On: 08/11/2016 03:15   Ct Chest W Contrast  Result Date: 08/12/2016 CLINICAL DATA:  Mid abdominal pain for 1 month. Worse with eating. MRI demonstrating adenopathy. EXAM: CT CHEST, ABDOMEN, AND PELVIS WITH CONTRAST TECHNIQUE: Multidetector CT imaging of the chest, abdomen and pelvis was performed following the standard protocol during bolus administration of intravenous contrast. CONTRAST:  39mL ISOVUE-300 IOPAMIDOL (ISOVUE-300) INJECTION 61% COMPARISON:  08/09/2016 abdominal MRI. FINDINGS: CT CHEST FINDINGS Cardiovascular: Tortuous thoracic aorta. Moderate cardiomegaly, without pericardial effusion. No central pulmonary embolism, on this non-dedicated study. Mediastinum/Nodes: No supraclavicular adenopathy. Subcentimeter right thyroid nodule is nonspecific. No middle mediastinal or hilar adenopathy in. Necrotic adenopathy in the prevascular space measures 2.2 by 2.0 cm on image 20/series 2. Lungs/Pleura: Small bilateral pleural effusions. Mild bibasilar atelectasis. Musculoskeletal: No acute osseous abnormality. Bilateral glenohumeral joint osteoarthritis. CT ABDOMEN PELVIS FINDINGS Hepatobiliary: Normal liver. Normal gallbladder, without biliary ductal dilatation. Pancreas: Pancreatic atrophy.  Pancreatic head displaced  anteriorly by portacaval adenopathy. No pancreatic duct dilatation. Spleen: Hypoattenuating splenic lesions, larger of which measures 2.1 cm. No splenomegaly. Adrenals/Urinary Tract: Normal adrenal glands. Heterogeneous lower pole right renal exophytic 3.6 cm mass. Normal left kidney. No hydronephrosis. Degraded evaluation of the pelvis, secondary to beam hardening artifact from left hip arthroplasty. Grossly normal urinary bladder. Stomach/Bowel: Gastric antrum displaced anteriorly by abdominal adenopathy. No obstruction. Normal colon and terminal ileum. Normal small bowel caliber. Vascular/Lymphatic: Aortic and branch vessel atherosclerosis. Extensive abdominal adenopathy. Nodal mass in the portal caval space measures 4.4 x 5.6 cm on image 57/series 2. Retroperitoneal adenopathy, with a necrotic retrocaval node measuring 3.3 x 4.2 cm on image 66/series 2. Extensive small bowel mesenteric adenopathy. Pelvic sidewalls not well evaluated. Reproductive: Hysterectomy.  No gross adnexal mass. Other: No significant free fluid. Musculoskeletal: Left hip arthroplasty.  Right hip osteoarthritis. IMPRESSION: 1. Adenopathy within the chest and abdomen, most consistent with lymphoma. 2. Degraded evaluation of the pelvis, secondary to beam hardening artifact from left hip arthroplasty 3. Right renal mass, suspicious for renal cell carcinoma. Please see recent abdominal MRI for further description. 4. Small bilateral pleural effusions. 5.  Aortic atherosclerosis. Electronically Signed   By: Abigail Miyamoto M.D.   On: 08/12/2016 13:30   Mr Jeri Cos IO Contrast  Result Date: 08/22/2016 CLINICAL DATA:  Non-Hodgkin's lymphoma, abdominal pain, and weight loss. EXAM: MRI HEAD WITHOUT AND WITH CONTRAST TECHNIQUE: Multiplanar, multiecho pulse sequences of the brain and surrounding structures were obtained without and with intravenous contrast. CONTRAST:  81mL MULTIHANCE GADOBENATE DIMEGLUMINE 529 MG/ML IV SOLN COMPARISON:  None.  FINDINGS: Brain: No evidence for acute infarction, hemorrhage, mass lesion, hydrocephalus, or extra-axial fluid. Mild atrophy. Mild subcortical and periventricular T2 and FLAIR hyperintensities, likely chronic microvascular ischemic change. Post infusion, no abnormal enhancement of the brain or meninges. Partial empty sella. Vascular: Normal flow voids. Skull and upper cervical spine: Normal marrow signal. Cervical spondylosis. Sinuses/Orbits: Chronic sinus disease. No acute orbital findings or masses. Other: None. IMPRESSION: Mild atrophy and small vessel disease. No acute intracranial findings. No abnormal postcontrast enhancement or osseous changes to suggest lymphoma within or surrounding the visualized CNS. Electronically Signed   By: Staci Righter M.D.   On: 08/22/2016 18:47   Mr Abdomen W Or Wo Contrast  Result Date: 08/09/2016 CLINICAL DATA:  Abdominal mass. Abdominal pain. Nausea and vomiting. EXAM: MRI ABDOMEN WITHOUT AND WITH CONTRAST TECHNIQUE: Multiplanar multisequence MR imaging of the abdomen was performed both before and after the administration of intravenous contrast. CONTRAST:  71mL MULTIHANCE GADOBENATE DIMEGLUMINE 529 MG/ML IV SOLN COMPARISON:  Ultrasound 08/09/2016 FINDINGS: Lower chest: Lung bases are clear. Hepatobiliary: No focal hepatic lesion. Large nodal mass in the porta hepatis measuring 6.0 x 4.2 cm is most consistent with large periportal lymph node. There is no biliary duct dilatation. Gallbladder normal. Common bile duct is difficult to follow. Portal veins are patent. Pancreas: Pancreas is difficult to evaluate. There is bulky adenopathy within the mesenteries adjacent the pancreas which makes defining the pancreatic tissue difficult. No discrete pancreatic lesion is identified. Spleen: Single 1.6 cm lesion within the spleen. Cannot exclude lymphoma lesion within the spleen. Spleen is normal volume.) Adrenals/urinary tract: Adrenal glands are normal. Along the lateral cortex  of the inferior RIGHT kidney there is a 3.4 x 4.0 cm enhancing mass (image 55, series 1501. This is clearly depicted on T2 weighted image 34, series 3. Stomach/Bowel: Stomach, small-bowel in limited view the: Unremarkable. No bowel obstruction Vascular/Lymphatic:  Abdominal aorta normal caliber. There is bulky adenopathy within the small bowel mesenteries centrally. Coronal image 12 series 10 and demonstrates no masses well. Mass lesions also demonstrated well on the eighth diffusion-weighted Ob 500 series. Example noted the LEFT upper abdomen measures 3.1 cm short axis. Course periportal nodes again demonstrated. Retroperitoneal nodes LEFT of the aorta measure 1.6 cm short axis (image 51, series 4. Likely large. Caval lymph node measuring 3.0 cm (image 54, series 4 Other: No free fluid. Musculoskeletal: No aggressive osseous lesion. IMPRESSION: 1. 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 Retroperitoneal adenopathy extends along the aorta and IVC 2. Indeterminate lesion within normal volume spleen. 3. Recommend oncology consultation and FDG PET scan for further staging and biopsy planning. 4. Additional 4 cm lesion exophytic from the inferior pole the RIGHT kidney is concerning for RENAL CELL CARCINOMA. Electronically Signed   By: Suzy Bouchard M.D.   On: 08/09/2016 12:35   US Abdomen Complete  Result Date: 08/09/2016 CLINICAL DATA:  Abdominal pain for 3 months EXAM: ABDOMEN ULTRASOUND COMPLETE COMPARISON:  None. FINDINGS: Gallbladder: No gallstones or wall thickening visualized. No sonographic Murphy sign noted by sonographer. Common bile duct: Diameter: Normal caliber, 2 mm Liver: There is a 5.9 x 4.8 x 4.6 cm mixed echotexture solid lesion in the left hepatic lobe. Overall, the lesion is hypoechoic. This is nonspecific and warrants additional characterization. IVC: No abnormality visualized. Pancreas: Enlarged hypoechoic mass in the pancreatic tail measures 8.3 x  7.8 x 5.9 cm. Hypoechoic mass in the region the pancreatic head measures 3.2 x 2.2 x 4.5 cm. Spleen: Normal size. Hypoechoic lesion centrally within the spleen measures 1.9 x 1.5 x 1.3 cm. Right Kidney: Length: 8.1 cm. No hydronephrosis. There is a solid mass off the lower pole of the right kidney measuring 3.4 x 3.3 x 3.2 cm. 1.6 cm exophytic cyst off the midpole. Left Kidney: Length: 9.8 cm. Echogenicity within normal limits. No mass or hydronephrosis visualized. Abdominal aorta: No aneurysm visualized. Other findings: Within the right lower quadrant, there is a solid mass measuring 5.8 x 3.7 x 3.2 cm of unknown etiology or source. This is adjacent to the aorta. IMPRESSION: Numerous solid organ abnormalities. 5.9 cm hypoechoic solid mass in the left hepatic lobe. At least 2 large masses in the pancreas, both pancreatic head and pancreatic tail. Hypoechoic lesions centrally within the spleen. 3.4 cm solid mass off the lower pole of the right kidney. In addition, there is a large soft tissue mass in the right lower quadrant of the abdomen adjacent to the aorta measuring up to 5.8 cm. These areas are concerning for malignancy and warrant further evaluation. Recommend MRI of the abdomen without and with contrast for further evaluation. Electronically Signed   By: Rolm Baptise M.D.   On: 08/09/2016 09:24   Ct Abdomen Pelvis W Contrast  Result Date: 08/12/2016 CLINICAL DATA:  Mid abdominal pain for 1 month. Worse with eating. MRI demonstrating adenopathy. EXAM: CT CHEST, ABDOMEN, AND PELVIS WITH CONTRAST TECHNIQUE: Multidetector CT imaging of the chest, abdomen and pelvis was performed following the standard protocol during bolus administration of intravenous contrast. CONTRAST:  109mL ISOVUE-300 IOPAMIDOL (ISOVUE-300) INJECTION 61% COMPARISON:  08/09/2016 abdominal MRI. FINDINGS: CT CHEST FINDINGS Cardiovascular: Tortuous thoracic aorta. Moderate cardiomegaly, without pericardial effusion. No central pulmonary  embolism, on this non-dedicated study. Mediastinum/Nodes: No supraclavicular adenopathy. Subcentimeter right thyroid nodule is nonspecific. No middle mediastinal or hilar adenopathy in. Necrotic adenopathy in the prevascular space measures 2.2 by  2.0 cm on image 20/series 2. Lungs/Pleura: Small bilateral pleural effusions. Mild bibasilar atelectasis. Musculoskeletal: No acute osseous abnormality. Bilateral glenohumeral joint osteoarthritis. CT ABDOMEN PELVIS FINDINGS Hepatobiliary: Normal liver. Normal gallbladder, without biliary ductal dilatation. Pancreas: Pancreatic atrophy. Pancreatic head displaced anteriorly by portacaval adenopathy. No pancreatic duct dilatation. Spleen: Hypoattenuating splenic lesions, larger of which measures 2.1 cm. No splenomegaly. Adrenals/Urinary Tract: Normal adrenal glands. Heterogeneous lower pole right renal exophytic 3.6 cm mass. Normal left kidney. No hydronephrosis. Degraded evaluation of the pelvis, secondary to beam hardening artifact from left hip arthroplasty. Grossly normal urinary bladder. Stomach/Bowel: Gastric antrum displaced anteriorly by abdominal adenopathy. No obstruction. Normal colon and terminal ileum. Normal small bowel caliber. Vascular/Lymphatic: Aortic and branch vessel atherosclerosis. Extensive abdominal adenopathy. Nodal mass in the portal caval space measures 4.4 x 5.6 cm on image 57/series 2. Retroperitoneal adenopathy, with a necrotic retrocaval node measuring 3.3 x 4.2 cm on image 66/series 2. Extensive small bowel mesenteric adenopathy. Pelvic sidewalls not well evaluated. Reproductive: Hysterectomy.  No gross adnexal mass. Other: No significant free fluid. Musculoskeletal: Left hip arthroplasty.  Right hip osteoarthritis. IMPRESSION: 1. Adenopathy within the chest and abdomen, most consistent with lymphoma. 2. Degraded evaluation of the pelvis, secondary to beam hardening artifact from left hip arthroplasty 3. Right renal mass, suspicious for renal  cell carcinoma. Please see recent abdominal MRI for further description. 4. Small bilateral pleural effusions. 5.  Aortic atherosclerosis. Electronically Signed   By: Abigail Miyamoto M.D.   On: 08/12/2016 13:30   Ct Biopsy  Result Date: 08/14/2016 INDICATION: No known primary, now with indeterminate right-sided renal lesion and retroperitoneal lymphadenopathy. Please perform CT-guided biopsy for tissue diagnostic purposes. EXAM: CT-GUIDED BIOPSY OF INDETERMINATE RIGHT-SIDED RETROPERITONEAL LYMPHADENOPATHY. COMPARISON:  CT the chest, abdomen and pelvis - 07/16/2016 MEDICATIONS: None. ANESTHESIA/SEDATION: Fentanyl 100 mcg IV; Versed 2 mg IV Sedation time: 15 minutes; The patient was continuously monitored during the procedure by the interventional radiology nurse under my direct supervision. CONTRAST:  None. COMPLICATIONS: None immediate. PROCEDURE: Informed consent was obtained from the patient following an explanation of the procedure, risks, benefits and alternatives. A time out was performed prior to the initiation of the procedure. The patient was positioned prone on the CT table and a limited CT was performed for procedural planning demonstrating unchanged size and appearance of the dominant right-sided retroperitoneal nodal conglomeration with dominant component measuring approximately 3.5 x 2.9 cm (image 25, series 3). The procedure was planned. The operative site was prepped and draped in the usual sterile fashion. Appropriate trajectory was confirmed with a 22 gauge spinal needle after the adjacent tissues were anesthetized with 1% Lidocaine with epinephrine. Under intermittent CT guidance, a 17 gauge coaxial needle was advanced into the peripheral aspect of the mass. Appropriate positioning was confirmed and 5 core needle biopsy samples were obtained with an 18 gauge core needle biopsy device. The co-axial needle was removed and hemostasis was achieved with manual compression. A limited postprocedural CT  was negative for hemorrhage or additional complication. A dressing was placed. The patient tolerated the procedure well without immediate postprocedural complication. IMPRESSION: Technically successful CT guided core needle biopsy of indeterminate right-sided retroperitoneal nodal conglomeration. Electronically Signed   By: Sandi Mariscal M.D.   On: 08/14/2016 13:50                                 *Benedict*                  *  [facility] [employer].                        [place], [place] [address]                            [phone]  ------------------------------------------------------------------- Transthoracic Echocardiography  Patient:    Penda, Venturi MR #:       [id number] Study Date: 08/23/2016 Gender:     F Age:        76 Height:     154.9 cm Weight:     49 kg BSA:        1.45 m^2 Pt. Status: Room:       [place], Inpatient  ORDERING     [place], [place], [place] Kishore  ADMITTING    Arrien, Jimmy Picket  SONOGRAPHER  Chelsea Androw  cc:  ------------------------------------------------------------------- LV EF: 60% -   65%  ------------------------------------------------------------------- Indications:      (Chemo I74.9).  ------------------------------------------------------------------- History:   PMH:  No prior cardiac history.  Risk factors: Dyslipidemia.  ------------------------------------------------------------------- Study Conclusions  - Left ventricle: The cavity size was normal. Wall thickness was   normal. Systolic function was normal. The estimated ejection   fraction was in the range of 60% to 65%. Features are consistent   with a pseudonormal left ventricular filling pattern, with   concomitant abnormal relaxation and increased filling pressure   (grade 2 diastolic dysfunction). Doppler parameters are    consistent with high ventricular filling pressure. - Aortic valve: There was trivial regurgitation. - Mitral valve: Prolapse of the posterior milral leaflet MR is   anteriorly directed and wraps aound wall of LA MR is severe. - Left atrium: The atrium was severely dilated. - Right atrium: The atrium was mildly to moderately dilated. - Pulmonary arteries: PA peak pressure: 49 mm Hg (S).    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 Patient has extensive retroperitoneal mesenteric and periportal lymphadenopathy along with adenopathy in the chest and supraclavicular areas and a focal lesion in the spleen.  . Recent Labs       Lab Results  Component Value Date   LDH 321 (H) 08/22/2016      #2 right lower pole of the kidney lesion  ? Renal cell carcinoma versus involvement by lymphoma .  #3 significant fatigue and failure to thrive due to poor oral intake related lymphoma and type B constitutional symptoms .  #4 Hyponatremia thought to be related to dehydration - mainly managed with IV fluids as per hospitalist. Could have an element of SIADH related to lymphoma. MRI of the brain shows no overt evidence of lymphoma involvement. Resolving with IVF  #5 hypokalemia and hypomagnesemia  PLAN -Electrolyte replacement as per hospitalist team. -Patient has had chemotherapy education and has provided informed consent to proceed with R mini CHOP chemotherapy today. -Would recommend G-CSF support. The patient is being discharged today or tomorrow would arrange for Neulasta as outpatient alternatively we will have to use Neupogen as inpatient for 5-7 days. -  Would recommend outpatient cardiology referral for management optimization of her severe mitral regurgitation. -PT/OT evaluation to determine discharge needs home with home care services versus SNF. -continue on tumor lysis syndrome  prophylaxis with allopurinol 100 mg by mouth twice a day. -Pain management as per hospitalist. -Would discontinue nonessential medications including simvastatin . -Goals of care were discussed in details . -Would not biopsy renal lesion at this time. Would reassess this after couple of cycles of treatment for resolution that might suggest that this is a lymphoma. If persistent might need evaluation and possible consideration of treatment after a few cycles of treating her lymphoma which is more immediate concern. Depending on her clinical status at the time I need to consider percutaneous interventions including percutaneous ablation therapy by interventional radiology vs laparoscopic surgical resection based on her clinical status. -I have sent the scheduling message to have her set up in clinic for follow-up with me in 7 days for toxicity check.   I spent 30 minutes counseling the patient face to face. The total time spent in the appointment was 35 minutes and more than 50% was on counseling and direct patient cares.    Sullivan Lone MD [place] AAHIVMS [facility] Hematology/Oncology Physician [facility]  (Office):       [phone] (Work cell):  [phone] (Fax):           [phone]  08/26/2016 8:25 AM

## 2016-08-26 NOTE — Progress Notes (Signed)
PROGRESS NOTE    Christina Peterson  IRJ:[id number] DOB: [birth year] DOA: 08/21/2016 PCP: Julius Bowels, MD (Inactive)    Brief Narrative:  This is a 76 year old female who was recently diagnosed with non-Hodgkin's lymphoma, presents with abdominal pain, nausea, decreased appetite and weight loss. No fevers, no diarrhea or vomiting. On initial physical examination temperature 97.7, blood pressure 148/78, heart rate 72, respiratory rate 18, oxygen saturation 100% on room air, oral mucosa is dry, her lungs are clear to auscultation, heart S1-S2 present rhythmic, her abdomen is soft, no masses palpable, no peritoneal signs, no lower extremity edema. Sodium 121, potassium 4.1, chloride 80, bicarbonate 29, glucose 103, BUN 8, creatinine 0.64, calcium 10.2, lipase 23, AST 63, ALT 31, white count 9.4, hemoglobin 12.9, hematocrit 39.2, platelets 324. Urine analysis negative for infection, specific gravity 1.012  The patient wasadmitted to hospital with the working diagnosis of hypovolemic hypo-osmolar hyponatremia complicated with metabolic alkalosis in the setting of newly diagnosed non-Hodgkin's lymphoma. Responding to IV fluids. MRI with no lesions, echocardiogram with preserved LV systolic function but severe mitral regurgitation with mitral valve prolapse. Scheduled for chemotherapy 05/14.   Assessment & Plan:   Principal Problem:   Hyponatremia Active Problems:   Normocytic normochromic anemia   Essential hypertension   Abdominal pain   Renal mass, right   Severe protein-calorie malnutrition (HCC)   B-cell lymphoma (HCC)   Renal cell adenocarcinoma (HCC)   Severe mitral regurgitation: Per 2 D echo 08/23/2016    1. Hypovolemic, hypo-osmolar hyponatremia. Serum Na stable at 133, hydration with normal saline at 50 cc per hour. No signs of volume overload. Tolerating po well, with no nausea or vomiting.   2. Metabolic alkalosis, chloride resistant. Serum bicarbonate at 31, serum, chloride  93, will continue saline IV, follow onr renal panel in am, avoid hypotension or nephrotoxic medications.  3. Hypertension. Continue losartan 25 mg daily.  4. Non-Hodgkin's lymphoma. On prednisone and allopurinol, started chemotherapy toady (05/14). Follow on oncology recommendations. Check uric acid.     5. Dyslipidemia. On simvastatin.   6. New diagnosed severe mitral regurgitation. Tolerating well hydration with IV fluids.  7. Severe protein calorie malnutrition. Continue  nutritional supplements, with good toleration.   DVT prophylaxis:enoxaparin  Code Status:full  Family Communication:I spoke with patient's family at the bedside and all questions were addressed.  Disposition Plan:home    Consultants:  Oncology   Procedures:    Antimicrobials:    Subjective: Patient feeling well, poor appetite, no nausea or vomiting, pain controlled, out of bed as tolerated, this am started on chemotherapy.   Objective: Vitals:   08/25/16 1424 08/25/16 2058 08/25/16 2107 08/26/16 0420  BP: (!) 141/73 (!) 164/86  (!) 162/73  Pulse: (!) 57 90  65  Resp: 18 18  18   Temp: 98.1 F (36.7 C) 98.3 F (36.8 C)  98.7 F (37.1 C)  TempSrc: Oral Oral  Oral  SpO2: 100% 98%  99%  Weight:   53.5 kg (118 lb)   Height:        Intake/Output Summary (Last 24 hours) at 08/26/16 1228 Last data filed at 08/26/16 0421  Gross per 24 hour  Intake          1013.75 ml  Output                0 ml  Net          1013.75 ml   Filed Weights   08/25/16 2107  Weight: 53.5 kg (118 lb)  Examination:  General exam: deconditioned E ENT: mild pallor, no icterus, oral mucosa moist.  Respiratory system: Clear to auscultation. Respiratory effort normal. No wheezing, rales or rhonchi.  Cardiovascular system: S1 & S2 heard, RRR. No JVD, murmurs, rubs, gallops or clicks. No pedal edema. Gastrointestinal system: Abdomen is nondistended, soft and nontender. No organomegaly or masses felt.  Normal bowel sounds heard. Central nervous system: Alert and oriented. No focal neurological deficits. Extremities: Symmetric 5 x 5 power. Skin: No rashes, lesions or ulcers     Data Reviewed: I have personally reviewed following labs and imaging studies  CBC:  Recent Labs Lab 08/21/16 1546 08/23/16 0347 08/24/16 0650  WBC 9.4 3.5* 7.8  NEUTROABS 8.0* 3.1 6.2  HGB 12.9 10.4* 10.1*  HCT 39.2 33.2* 31.0*  MCV 86.0 88.5 87.3  PLT 324 248 637   Basic Metabolic Panel:  Recent Labs Lab 08/22/16 0347 08/23/16 0347 08/24/16 0650 08/25/16 0327 08/26/16 0500  NA 127* 131* 132* 132* 133*  K 3.5 4.3 3.4* 3.9 3.3*  CL 89* 95* 97* 97* 94*  CO2 30 29 28 28 31   GLUCOSE 99 146* 88 111* 91  BUN 8 7 7 8 6   CREATININE 0.47 0.53 0.53 0.49 0.44  CALCIUM 9.4 9.4 9.5 9.3 9.5  MG  --   --   --  1.4*  --    GFR: Estimated Creatinine Clearance: 44.4 mL/min (by C-G formula based on SCr of 0.44 mg/dL). Liver Function Tests:  Recent Labs Lab 08/21/16 1546 08/22/16 0347  AST 63* 43*  ALT 31 23  ALKPHOS 121 102  BILITOT 1.6* 1.3*  PROT 7.8 6.1*  ALBUMIN 4.2 3.4*    Recent Labs Lab 08/21/16 1546  LIPASE 23   No results for input(s): AMMONIA in the last 168 hours. Coagulation Profile: No results for input(s): INR, PROTIME in the last 168 hours. Cardiac Enzymes: No results for input(s): CKTOTAL, CKMB, CKMBINDEX, TROPONINI in the last 168 hours. BNP (last 3 results) No results for input(s): PROBNP in the last 8760 hours. HbA1C: No results for input(s): HGBA1C in the last 72 hours. CBG: No results for input(s): GLUCAP in the last 168 hours. Lipid Profile: No results for input(s): CHOL, HDL, LDLCALC, TRIG, CHOLHDL, LDLDIRECT in the last 72 hours. Thyroid Function Tests: No results for input(s): TSH, T4TOTAL, FREET4, T3FREE, THYROIDAB in the last 72 hours. Anemia Panel: No results for input(s): VITAMINB12, FOLATE, FERRITIN, TIBC, IRON, RETICCTPCT in the last 72 hours. Sepsis  Labs: No results for input(s): PROCALCITON, LATICACIDVEN in the last 168 hours.  No results found for this or any previous visit (from the past 240 hour(s)).       Radiology Studies: No results found.      Scheduled Meds: . allopurinol  100 mg Oral BID  . aspirin  81 mg Oral Daily  . calcium-vitamin D  1 tablet Oral BID  . cyclophosphamide  500 mg/m2 (Treatment Plan Recorded) Intravenous Once  . docusate sodium  100 mg Oral Q12H  . enoxaparin (LOVENOX) injection  40 mg Subcutaneous Q24H  . feeding supplement  1 Container Oral TID WC  . feeding supplement (ENSURE ENLIVE)  237 mL Oral BID BM  . losartan  25 mg Oral Daily  . meloxicam  7.5 mg Oral Daily  . pantoprazole  20 mg Oral Daily  . polyethylene glycol  17 g Oral Daily  . predniSONE  50 mg Oral Q breakfast  . riTUXimab (RITUXAN) IV infusion  375 mg/m2 (Treatment Plan Recorded)  Intravenous Once  . simvastatin  10 mg Oral QHS  . sucralfate  1 g Oral QID   Continuous Infusions: . dextrose 5 % and 0.9% NaCl 50 mL/hr at 08/25/16 1809  . famotidine       LOS: 5 days      Tawni Millers, MD [facility] Pager [phone]  If 7PM-7AM, please contact night-coverage [email or web address] Password [facility] 08/26/2016, 12:28 PM

## 2016-08-26 NOTE — Progress Notes (Signed)
Pt from home and independent with ADLs prior to admission. PT signed off due to no needs identified.  Marney Doctor RN,BSN,NCM [phone]

## 2016-08-26 NOTE — Progress Notes (Signed)
Chemo teaching reinforced with patient. Consent obtained. Chemo dosages and dilutions verified with 2nd chemo RN.  Will administer chemo as ordered.

## 2016-08-27 DIAGNOSIS — D649 Anemia, unspecified: Secondary | ICD-10-CM

## 2016-08-27 DIAGNOSIS — N2889 Other specified disorders of kidney and ureter: Secondary | ICD-10-CM

## 2016-08-27 LAB — TROPONIN I: Troponin I: <0.03 ng/mL (ref <0.03)

## 2016-08-27 LAB — URIC ACID: Uric Acid, Serum: 1.6 mg/dL — ABNORMAL LOW (ref 2.3–6.6)

## 2016-08-27 MED ORDER — POLYETHYLENE GLYCOL 3350 17 G PO PACK: 17.0000 g | PACK | Freq: Every day | ORAL | Status: DC

## 2016-08-27 MED ORDER — HEPARIN SOD (PORK) LOCK FLUSH 100 UNIT/ML IV SOLN: 500.0000 [IU] | Freq: Once | INTRAVENOUS | Status: DC

## 2016-08-27 MED ORDER — ASPIRIN 81 MG PO CHEW
162.0000 mg | CHEWABLE_TABLET | Freq: Once | ORAL | Status: AC
Start: 2016-08-27 — End: 2016-08-27
  Administered 2016-08-27: 162 mg via ORAL
  Filled 2016-08-27: qty 2

## 2016-08-27 MED ORDER — NITROGLYCERIN 0.4 MG SL SUBL
0.4000 mg | SUBLINGUAL_TABLET | SUBLINGUAL | Status: DC | PRN
  Administered 2016-08-27: 0.4 mg via SUBLINGUAL
  Filled 2016-08-27: qty 1

## 2016-08-27 MED ORDER — ALLOPURINOL 100 MG PO TABS: 100.0000 mg | ORAL_TABLET | Freq: Two times a day (BID) | ORAL | 0 refills | Status: DC

## 2016-08-27 NOTE — Discharge Summary (Signed)
Physician Discharge Summary  Christina Peterson DJS:[id number] DOB: 1941/01/17 DOA: 08/21/2016  PCP: Julius Bowels, MD (Inactive)  Admit date: 08/21/2016 Discharge date: 08/27/2016  Admitted From: Home  Disposition:  Home   Recommendations for Outpatient Follow-up:  1. Follow up with PCP in 1- weeks 2. Please follow with Dr. Irene Limbo in 7 days for toxicity check  Home Health: No  Equipment/Devices: No   Discharge Condition: Stable CODE STATUS: Full  Diet recommendation: Heart Healthy    Brief/Interim Summary: This is a 76 year old female who was recently diagnosed with non-Hodgkin's lymphoma, presents with abdominal pain, nausea, decreased appetite and weight loss. No fevers, no diarrhea or vomiting. On initial physical examination temperature 97.7, blood pressure 148/78, heart rate 72, respiratory rate 18, oxygen saturation 100% on room air, oral mucosa is dry, her lungs are clear to auscultation, heart S1-S2 present rhythmic, her abdomen is soft, no masses palpable, no peritoneal signs, no lower extremity edema. Sodium 121, potassium 4.1, chloride 80, bicarbonate 29, glucose 103, BUN 8, creatinine 0.64, calcium 10.2, lipase 23, AST 63, ALT 31, white count 9.4, hemoglobin 12.9, hematocrit 39.2, platelets 324. Urine analysis negative for infection, specific gravity 1.012  The patient wasadmitted to hospital with the working diagnosis of hypovolemic hypo-osmolar hyponatremia complicated with metabolic alkalosis in the setting of newly diagnosed non-Hodgkin's lymphoma.  1. Hypovolemic, hypo-osmolar hyponatremia. Patient was admitted to the medical floor, she was placed on isotonic saline with slow progressive correction of serum sodium. Discharge sodium 133. The kidney function remained preserved. No signs of volume overload. Patient tolerated well by mouth intake by the time discharge.  2. Metabolic alkalosis, chloride resistant. Patient was hydrated with normal saline, discharge bicarbonate  31. Urinary electrolytes showed a urinary chloride of 59 with a urinary sodium of 60, osmolality 350. It is presumed that acid base disbalance is related to hypoproteinemia. Follow-up kidney function as an outpatient.  3. Hypertension. Patient was placed on losartan 25 g daily, she will resume her full dose of 50 mg daily by the time discharge.   4. 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 Patient with extensive retroperitoneal mesenteric and peritoneal lymphadenopathy along with adenopathy in the chest and supraclavicular areas plus focal lesion in the spleen. Patient received chemotherapy, (R mini CHOP) plus tumor lysis syndrome prophylaxis with allopurinol. Neulasta will be arranged as an outpatient. Positive renal lesion on the right lower pole, follow-up as an outpatient.  5. Newly diagnosed severe mitral regurgitation. Echocardiography with prolapse of the posterior mitral leaflet with severe regurgitation. Left ventricular systolic function preserved with ejection fraction 60-65%. Will refer for outpatient follow-up. Discussed over the phone with cardiology. No signs of heart failure.  6. Severe protein calorie malnutrition. Patient was placed on nutritional supplements. Will need close follow-up as an outpatient.     Discharge Diagnoses:  Principal Problem:   Hyponatremia Active Problems:   Normocytic normochromic anemia   Essential hypertension   Abdominal pain   Renal mass, right   Severe protein-calorie malnutrition (HCC)   B-cell lymphoma (HCC)   Renal cell adenocarcinoma (HCC)   Severe mitral regurgitation: Per 2 D echo 08/23/2016    Discharge Instructions  Discharge Instructions    SCHEDULING COMMUNICATION    Complete by:  As directed    Chemotherapy Appointment - 7 hr   TREATMENT CONDITIONS    Complete by:  As directed    Patient should have CBC & CMP within 7 days prior to chemotherapy administration. NOTIFY MD IF: [place]  <  1500, Hemoglobin < 8, PLT < 100,000,  Total Bili > 1.5, Creatinine > 1.5, ALT & AST > 80 or if patient has unstable vital signs: Temperature > 38.5, SBP > 180 or < 90, RR > 30 or HR > 100.     Allergies as of 08/27/2016   No Known Allergies     Medication List    TAKE these medications   allopurinol 100 MG tablet Commonly known as:  ZYLOPRIM Take 1 tablet (100 mg total) by mouth 2 (two) times daily.   CITRACAL MAXIMUM PO Take 2 tablets by mouth daily.   docusate sodium 100 MG capsule Commonly known as:  COLACE Take 1 capsule (100 mg total) by mouth every 12 (twelve) hours.   feeding supplement (ENSURE ENLIVE) Liqd Take 237 mLs by mouth 2 (two) times daily between meals.   feeding supplement Liqd Take 1 Container by mouth 2 (two) times daily between meals.   losartan 50 MG tablet Commonly known as:  COZAAR Take 1 tablet (50 mg total) by mouth daily.   meloxicam 7.5 MG tablet Commonly known as:  MOBIC Take 7.5 mg by mouth daily.   oxyCODONE-acetaminophen 5-325 MG tablet Commonly known as:  PERCOCET/ROXICET Take 1 tablet by mouth every 4 (four) hours as needed for severe pain.   pantoprazole 20 MG tablet Commonly known as:  PROTONIX Take 1 tablet (20 mg total) by mouth daily.   polyethylene glycol packet Commonly known as:  MIRALAX / GLYCOLAX Take 17 g by mouth daily.   simvastatin 10 MG tablet Commonly known as:  ZOCOR Take 10 mg by mouth at bedtime.   sucralfate 1 g tablet Commonly known as:  CARAFATE Take 1 tablet (1 g total) by mouth 4 (four) times daily.       No Known Allergies  Consultations:  Oncology    Procedures/Studies: Dg Abd 1 View  Result Date: 08/11/2016 CLINICAL DATA:  Nausea, constipation EXAM: ABDOMEN - 1 VIEW COMPARISON:  None. FINDINGS: The bowel gas pattern is normal. Phleboliths are noted within the pelvis. There is lower lumbar degenerative disc and facet arthropathy from approximately L4 caudad with osteoarthritis of the  sacroiliac joints bilaterally and pubic symphysis. The partially visualized left total hip arthroplasty is noted with the acetabular component placed slightly more cephalad than the normal expected location of the left acetabulum. 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 The right hip demonstrates native joint space narrowing. IMPRESSION: No significant stool burden. No bowel obstruction. 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 Electronically Signed   By: Ashley Royalty M.D.   On: 08/11/2016 03:15   Ct Chest W Contrast  Result Date: 08/12/2016 CLINICAL DATA:  Mid abdominal pain for 1 month. Worse with eating. MRI demonstrating adenopathy. EXAM: CT CHEST, ABDOMEN, AND PELVIS WITH CONTRAST TECHNIQUE: Multidetector CT imaging of the chest, abdomen and pelvis was performed following the standard protocol during bolus administration of intravenous contrast. CONTRAST:  22mL ISOVUE-300 IOPAMIDOL (ISOVUE-300) INJECTION 61% COMPARISON:  08/09/2016 abdominal MRI. FINDINGS: CT CHEST FINDINGS Cardiovascular: Tortuous thoracic aorta. Moderate cardiomegaly, without pericardial effusion. No central pulmonary embolism, on this non-dedicated study. Mediastinum/Nodes: No supraclavicular adenopathy. Subcentimeter right thyroid nodule is nonspecific. No middle mediastinal or hilar adenopathy in. Necrotic adenopathy in the prevascular space measures 2.2 by 2.0 cm on image 20/series 2. Lungs/Pleura: Small bilateral pleural effusions. Mild bibasilar atelectasis. Musculoskeletal:  No acute osseous abnormality. Bilateral glenohumeral joint osteoarthritis. CT ABDOMEN PELVIS FINDINGS Hepatobiliary: Normal liver. Normal gallbladder, without biliary ductal dilatation. Pancreas: Pancreatic atrophy. Pancreatic head displaced anteriorly by portacaval adenopathy. No  pancreatic duct dilatation. Spleen: Hypoattenuating splenic lesions, larger of which measures 2.1 cm. No splenomegaly. Adrenals/Urinary Tract: Normal adrenal glands. Heterogeneous lower pole right renal exophytic 3.6 cm mass. Normal left kidney. No hydronephrosis. Degraded evaluation of the pelvis, secondary to beam hardening artifact from left hip arthroplasty. Grossly normal urinary bladder. Stomach/Bowel: Gastric antrum displaced anteriorly by abdominal adenopathy. No obstruction. Normal colon and terminal ileum. Normal small bowel caliber. Vascular/Lymphatic: Aortic and branch vessel atherosclerosis. Extensive abdominal adenopathy. Nodal mass in the portal caval space measures 4.4 x 5.6 cm on image 57/series 2. Retroperitoneal adenopathy, with a necrotic retrocaval node measuring 3.3 x 4.2 cm on image 66/series 2. Extensive small bowel mesenteric adenopathy. Pelvic sidewalls not well evaluated. Reproductive: Hysterectomy.  No gross adnexal mass. Other: No significant free fluid. Musculoskeletal: Left hip arthroplasty.  Right hip osteoarthritis. IMPRESSION: 1. Adenopathy within the chest and abdomen, most consistent with lymphoma. 2. Degraded evaluation of the pelvis, secondary to beam hardening artifact from left hip arthroplasty 3. Right renal mass, suspicious for renal cell carcinoma. Please see recent abdominal MRI for further description. 4. Small bilateral pleural effusions. 5.  Aortic atherosclerosis. Electronically Signed   By: Abigail Miyamoto M.D.   On: 08/12/2016 13:30   Mr Jeri Cos SK Contrast  Result Date: 08/22/2016 CLINICAL DATA:  Non-Hodgkin's lymphoma, abdominal pain, and weight loss. EXAM: MRI HEAD WITHOUT AND WITH CONTRAST TECHNIQUE: Multiplanar, multiecho pulse sequences of the brain and surrounding structures were obtained without and with intravenous contrast. CONTRAST:  24mL MULTIHANCE GADOBENATE DIMEGLUMINE 529 MG/ML IV SOLN COMPARISON:  None. FINDINGS: Brain: No evidence for acute  infarction, hemorrhage, mass lesion, hydrocephalus, or extra-axial fluid. Mild atrophy. Mild subcortical and periventricular T2 and FLAIR hyperintensities, likely chronic microvascular ischemic change. Post infusion, no abnormal enhancement of the brain or meninges. Partial empty sella. Vascular: Normal flow voids. Skull and upper cervical spine: Normal marrow signal. Cervical spondylosis. Sinuses/Orbits: Chronic sinus disease. No acute orbital findings or masses. Other: None. IMPRESSION: Mild atrophy and small vessel disease. No acute intracranial findings. No abnormal postcontrast enhancement or osseous changes to suggest lymphoma within or surrounding the visualized CNS. Electronically Signed   By: Staci Righter M.D.   On: 08/22/2016 18:47   Mr Abdomen W Or Wo Contrast  Result Date: 08/09/2016 CLINICAL DATA:  Abdominal mass. Abdominal pain. Nausea and vomiting. EXAM: MRI ABDOMEN WITHOUT AND WITH CONTRAST TECHNIQUE: Multiplanar multisequence MR imaging of the abdomen was performed both before and after the administration of intravenous contrast. CONTRAST:  64mL MULTIHANCE GADOBENATE DIMEGLUMINE 529 MG/ML IV SOLN COMPARISON:  Ultrasound 08/09/2016 FINDINGS: Lower chest: Lung bases are clear. Hepatobiliary: No focal hepatic lesion. Large nodal mass in the porta hepatis measuring 6.0 x 4.2 cm is most consistent with large periportal lymph node. There is no biliary duct dilatation. Gallbladder normal. Common bile duct is difficult to follow. Portal veins are patent. Pancreas: Pancreas is difficult to evaluate. There is bulky adenopathy within the mesenteries adjacent the pancreas which makes defining the pancreatic tissue difficult. No discrete pancreatic lesion is identified. Spleen: Single 1.6 cm lesion within the spleen. Cannot exclude lymphoma lesion within the spleen. Spleen is normal volume.) Adrenals/urinary tract: Adrenal glands are normal. Along the lateral cortex of the inferior RIGHT kidney there is a  3.4 x 4.0 cm enhancing mass (image 55, series  1501. This is clearly depicted on T2 weighted image 34, series 3. Stomach/Bowel: Stomach, small-bowel in limited view the: Unremarkable. No bowel obstruction Vascular/Lymphatic: Abdominal aorta normal caliber. There is bulky adenopathy within the small bowel mesenteries centrally. Coronal image 12 series 10 and demonstrates no masses well. Mass lesions also demonstrated well on the eighth diffusion-weighted Ob 500 series. Example noted the LEFT upper abdomen measures 3.1 cm short axis. Course periportal nodes again demonstrated. Retroperitoneal nodes LEFT of the aorta measure 1.6 cm short axis (image 51, series 4. Likely large. Caval lymph node measuring 3.0 cm (image 54, series 4 Other: No free fluid. Musculoskeletal: No aggressive osseous lesion. IMPRESSION: 1. 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 Retroperitoneal adenopathy extends along the aorta and IVC 2. Indeterminate lesion within normal volume spleen. 3. Recommend oncology consultation and FDG PET scan for further staging and biopsy planning. 4. Additional 4 cm lesion exophytic from the inferior pole the RIGHT kidney is concerning for RENAL CELL CARCINOMA. Electronically Signed   By: Suzy Bouchard M.D.   On: 08/09/2016 12:35   US Abdomen Complete  Result Date: 08/09/2016 CLINICAL DATA:  Abdominal pain for 3 months EXAM: ABDOMEN ULTRASOUND COMPLETE COMPARISON:  None. FINDINGS: Gallbladder: No gallstones or wall thickening visualized. No sonographic Murphy sign noted by sonographer. Common bile duct: Diameter: Normal caliber, 2 mm Liver: There is a 5.9 x 4.8 x 4.6 cm mixed echotexture solid lesion in the left hepatic lobe. Overall, the lesion is hypoechoic. This is nonspecific and warrants additional characterization. IVC: No abnormality visualized. Pancreas: Enlarged hypoechoic mass in the pancreatic tail measures 8.3 x 7.8 x 5.9 cm. Hypoechoic mass in the region  the pancreatic head measures 3.2 x 2.2 x 4.5 cm. Spleen: Normal size. Hypoechoic lesion centrally within the spleen measures 1.9 x 1.5 x 1.3 cm. Right Kidney: Length: 8.1 cm. No hydronephrosis. There is a solid mass off the lower pole of the right kidney measuring 3.4 x 3.3 x 3.2 cm. 1.6 cm exophytic cyst off the midpole. Left Kidney: Length: 9.8 cm. Echogenicity within normal limits. No mass or hydronephrosis visualized. Abdominal aorta: No aneurysm visualized. Other findings: Within the right lower quadrant, there is a solid mass measuring 5.8 x 3.7 x 3.2 cm of unknown etiology or source. This is adjacent to the aorta. IMPRESSION: Numerous solid organ abnormalities. 5.9 cm hypoechoic solid mass in the left hepatic lobe. At least 2 large masses in the pancreas, both pancreatic head and pancreatic tail. Hypoechoic lesions centrally within the spleen. 3.4 cm solid mass off the lower pole of the right kidney. In addition, there is a large soft tissue mass in the right lower quadrant of the abdomen adjacent to the aorta measuring up to 5.8 cm. These areas are concerning for malignancy and warrant further evaluation. Recommend MRI of the abdomen without and with contrast for further evaluation. Electronically Signed   By: Rolm Baptise M.D.   On: 08/09/2016 09:24   Ct Abdomen Pelvis W Contrast  Result Date: 08/12/2016 CLINICAL DATA:  Mid abdominal pain for 1 month. Worse with eating. MRI demonstrating adenopathy. EXAM: CT CHEST, ABDOMEN, AND PELVIS WITH CONTRAST TECHNIQUE: Multidetector CT imaging of the chest, abdomen and pelvis was performed following the standard protocol during bolus administration of intravenous contrast. CONTRAST:  16mL ISOVUE-300 IOPAMIDOL (ISOVUE-300) INJECTION 61% COMPARISON:  08/09/2016 abdominal MRI. FINDINGS: CT CHEST FINDINGS Cardiovascular: Tortuous thoracic aorta. Moderate cardiomegaly, without pericardial effusion. No central pulmonary embolism, on this non-dedicated study.  Mediastinum/Nodes: No  supraclavicular adenopathy. Subcentimeter right thyroid nodule is nonspecific. No middle mediastinal or hilar adenopathy in. Necrotic adenopathy in the prevascular space measures 2.2 by 2.0 cm on image 20/series 2. Lungs/Pleura: Small bilateral pleural effusions. Mild bibasilar atelectasis. Musculoskeletal: No acute osseous abnormality. Bilateral glenohumeral joint osteoarthritis. CT ABDOMEN PELVIS FINDINGS Hepatobiliary: Normal liver. Normal gallbladder, without biliary ductal dilatation. Pancreas: Pancreatic atrophy. Pancreatic head displaced anteriorly by portacaval adenopathy. No pancreatic duct dilatation. Spleen: Hypoattenuating splenic lesions, larger of which measures 2.1 cm. No splenomegaly. Adrenals/Urinary Tract: Normal adrenal glands. Heterogeneous lower pole right renal exophytic 3.6 cm mass. Normal left kidney. No hydronephrosis. Degraded evaluation of the pelvis, secondary to beam hardening artifact from left hip arthroplasty. Grossly normal urinary bladder. Stomach/Bowel: Gastric antrum displaced anteriorly by abdominal adenopathy. No obstruction. Normal colon and terminal ileum. Normal small bowel caliber. Vascular/Lymphatic: Aortic and branch vessel atherosclerosis. Extensive abdominal adenopathy. Nodal mass in the portal caval space measures 4.4 x 5.6 cm on image 57/series 2. Retroperitoneal adenopathy, with a necrotic retrocaval node measuring 3.3 x 4.2 cm on image 66/series 2. Extensive small bowel mesenteric adenopathy. Pelvic sidewalls not well evaluated. Reproductive: Hysterectomy.  No gross adnexal mass. Other: No significant free fluid. Musculoskeletal: Left hip arthroplasty.  Right hip osteoarthritis. IMPRESSION: 1. Adenopathy within the chest and abdomen, most consistent with lymphoma. 2. Degraded evaluation of the pelvis, secondary to beam hardening artifact from left hip arthroplasty 3. Right renal mass, suspicious for renal cell carcinoma. Please see recent  abdominal MRI for further description. 4. Small bilateral pleural effusions. 5.  Aortic atherosclerosis. Electronically Signed   By: Abigail Miyamoto M.D.   On: 08/12/2016 13:30   Ir US Guide Vasc Access Right  Result Date: 08/23/2016 CLINICAL DATA:  Lymphoma, access for chemotherapy EXAM: RIGHT INTERNAL JUGULAR SINGLE LUMEN POWER PORT CATHETER INSERTION Date:  5/11/20185/02/2017 4:19 pm Radiologist:  M. Daryll Brod, MD Guidance:  Ultrasound and fluoroscopic MEDICATIONS: Ancef 2 g; The antibiotic was administered within an appropriate time interval prior to skin puncture. ANESTHESIA/SEDATION: Versed 2.0 mg IV; Fentanyl 75 mcg IV; Moderate Sedation Time:  24 minutes The patient was continuously monitored during the procedure by the interventional radiology nurse under my direct supervision. FLUOROSCOPY TIME:  54 seconds (4 mGy) COMPLICATIONS: None immediate. CONTRAST:  None. PROCEDURE: Informed consent was obtained from the patient following explanation of the procedure, risks, benefits and alternatives. The patient understands, agrees and consents for the procedure. All questions were addressed. A time out was performed. Maximal barrier sterile technique utilized including caps, mask, sterile gowns, sterile gloves, large sterile drape, hand hygiene, and 2% chlorhexidine scrub. Under sterile conditions and local anesthesia, right internal jugular micropuncture venous access was performed. Access was performed with ultrasound. Images were obtained for documentation. A guide wire was inserted followed by a transitional dilator. This allowed insertion of a guide wire and catheter into the IVC. Measurements were obtained from the SVC / RA junction back to the right IJ venotomy site. In the right infraclavicular chest, a subcutaneous pocket was created over the second anterior rib. This was done under sterile conditions and local anesthesia. 1% lidocaine with epinephrine was utilized for this. A 2.5 cm incision was made  in the skin. Blunt dissection was performed to create a subcutaneous pocket over the right pectoralis major muscle. The pocket was flushed with saline vigorously. There was adequate hemostasis. The port catheter was assembled and checked for leakage. The port catheter was secured in the pocket with two retention sutures. The tubing was tunneled subcutaneously  to the right venotomy site and inserted into the SVC/RA junction through a valved peel-away sheath. Position was confirmed with fluoroscopy. Images were obtained for documentation. The patient tolerated the procedure well. No immediate complications. Incisions were closed in a two layer fashion with 4 - 0 Vicryl suture. Dermabond was applied to the skin. The port catheter was accessed, blood was aspirated followed by saline and heparin flushes. Needle was removed. A dry sterile dressing was applied. IMPRESSION: Ultrasound and fluoroscopically guided right internal jugular single lumen power port catheter insertion. Tip in the SVC/RA junction. Catheter ready for use. Electronically Signed   By: Jerilynn Mages.  Shick M.D.   On: 08/23/2016 16:21   Ct Biopsy  Result Date: 08/14/2016 INDICATION: No known primary, now with indeterminate right-sided renal lesion and retroperitoneal lymphadenopathy. Please perform CT-guided biopsy for tissue diagnostic purposes. EXAM: CT-GUIDED BIOPSY OF INDETERMINATE RIGHT-SIDED RETROPERITONEAL LYMPHADENOPATHY. COMPARISON:  CT the chest, abdomen and pelvis - 07/16/2016 MEDICATIONS: None. ANESTHESIA/SEDATION: Fentanyl 100 mcg IV; Versed 2 mg IV Sedation time: 15 minutes; The patient was continuously monitored during the procedure by the interventional radiology nurse under my direct supervision. CONTRAST:  None. COMPLICATIONS: None immediate. PROCEDURE: Informed consent was obtained from the patient following an explanation of the procedure, risks, benefits and alternatives. A time out was performed prior to the initiation of the procedure. The  patient was positioned prone on the CT table and a limited CT was performed for procedural planning demonstrating unchanged size and appearance of the dominant right-sided retroperitoneal nodal conglomeration with dominant component measuring approximately 3.5 x 2.9 cm (image 25, series 3). The procedure was planned. The operative site was prepped and draped in the usual sterile fashion. Appropriate trajectory was confirmed with a 22 gauge spinal needle after the adjacent tissues were anesthetized with 1% Lidocaine with epinephrine. Under intermittent CT guidance, a 17 gauge coaxial needle was advanced into the peripheral aspect of the mass. Appropriate positioning was confirmed and 5 core needle biopsy samples were obtained with an 18 gauge core needle biopsy device. The co-axial needle was removed and hemostasis was achieved with manual compression. A limited postprocedural CT was negative for hemorrhage or additional complication. A dressing was placed. The patient tolerated the procedure well without immediate postprocedural complication. IMPRESSION: Technically successful CT guided core needle biopsy of indeterminate right-sided retroperitoneal nodal conglomeration. Electronically Signed   By: Sandi Mariscal M.D.   On: 08/14/2016 13:50   Ir Fluoro Guide Port Insertion Right  Result Date: 08/23/2016 CLINICAL DATA:  Lymphoma, access for chemotherapy EXAM: RIGHT INTERNAL JUGULAR SINGLE LUMEN POWER PORT CATHETER INSERTION Date:  5/11/20185/02/2017 4:19 pm Radiologist:  M. Daryll Brod, MD Guidance:  Ultrasound and fluoroscopic MEDICATIONS: Ancef 2 g; The antibiotic was administered within an appropriate time interval prior to skin puncture. ANESTHESIA/SEDATION: Versed 2.0 mg IV; Fentanyl 75 mcg IV; Moderate Sedation Time:  24 minutes The patient was continuously monitored during the procedure by the interventional radiology nurse under my direct supervision. FLUOROSCOPY TIME:  54 seconds (4 mGy) COMPLICATIONS:  None immediate. CONTRAST:  None. PROCEDURE: Informed consent was obtained from the patient following explanation of the procedure, risks, benefits and alternatives. The patient understands, agrees and consents for the procedure. All questions were addressed. A time out was performed. Maximal barrier sterile technique utilized including caps, mask, sterile gowns, sterile gloves, large sterile drape, hand hygiene, and 2% chlorhexidine scrub. Under sterile conditions and local anesthesia, right internal jugular micropuncture venous access was performed. Access was performed with ultrasound. Images were obtained  for documentation. A guide wire was inserted followed by a transitional dilator. This allowed insertion of a guide wire and catheter into the IVC. Measurements were obtained from the SVC / RA junction back to the right IJ venotomy site. In the right infraclavicular chest, a subcutaneous pocket was created over the second anterior rib. This was done under sterile conditions and local anesthesia. 1% lidocaine with epinephrine was utilized for this. A 2.5 cm incision was made in the skin. Blunt dissection was performed to create a subcutaneous pocket over the right pectoralis major muscle. The pocket was flushed with saline vigorously. There was adequate hemostasis. The port catheter was assembled and checked for leakage. The port catheter was secured in the pocket with two retention sutures. The tubing was tunneled subcutaneously to the right venotomy site and inserted into the SVC/RA junction through a valved peel-away sheath. Position was confirmed with fluoroscopy. Images were obtained for documentation. The patient tolerated the procedure well. No immediate complications. Incisions were closed in a two layer fashion with 4 - 0 Vicryl suture. Dermabond was applied to the skin. The port catheter was accessed, blood was aspirated followed by saline and heparin flushes. Needle was removed. A dry sterile dressing  was applied. IMPRESSION: Ultrasound and fluoroscopically guided right internal jugular single lumen power port catheter insertion. Tip in the SVC/RA junction. Catheter ready for use. Electronically Signed   By: Jerilynn Mages.  Shick M.D.   On: 08/23/2016 16:21      Subjective: Patient had chest pain last night, improved this am, troponins were negative. No nausea or vomiting, tolerating po well. Completed chemotherapy infusion.   Discharge Exam: Vitals:   08/26/16 2114 08/27/16 0449  BP: (!) 152/82 (!) 155/74  Pulse: 87 76  Resp: 18 18  Temp: 98 F (36.7 C) 98.8 F (37.1 C)   Vitals:   08/26/16 1429 08/26/16 1456 08/26/16 2114 08/27/16 0449  BP: 137/73 124/62 (!) 152/82 (!) 155/74  Pulse: 65 66 87 76  Resp:   18 18  Temp: 97.9 F (36.6 C) 98.2 F (36.8 C) 98 F (36.7 C) 98.8 F (37.1 C)  TempSrc: Oral Oral Oral Oral  SpO2: 99% 99% 98% 99%  Weight:      Height:        General: Pt is alert, awake, not in acute distress E ENT. Mild pallor, no icterus, oral mucosa moist.  Cardiovascular: RRR, S1/S2 +, no rubs, no gallops Respiratory: CTA bilaterally, no wheezing, no rhonchi Abdominal: Soft, NT, ND, bowel sounds + Extremities: no edema, no cyanosis    The results of significant diagnostics from this hospitalization (including imaging, microbiology, ancillary and laboratory) are listed below for reference.     Microbiology: No results found for this or any previous visit (from the past 240 hour(s)).   Labs: BNP (last 3 results) No results for input(s): BNP in the last 8760 hours. Basic Metabolic Panel:  Recent Labs Lab 08/22/16 0347 08/23/16 0347 08/24/16 0650 08/25/16 0327 08/26/16 0500  NA 127* 131* 132* 132* 133*  K 3.5 4.3 3.4* 3.9 3.3*  CL 89* 95* 97* 97* 94*  CO2 30 29 28 28 31   GLUCOSE 99 146* 88 111* 91  BUN 8 7 7 8 6   CREATININE 0.47 0.53 0.53 0.49 0.44  CALCIUM 9.4 9.4 9.5 9.3 9.5  MG  --   --   --  1.4*  --    Liver Function Tests:  Recent Labs Lab  08/21/16 1546 08/22/16 0347  AST 63* 43*  ALT 31 23  ALKPHOS 121 102  BILITOT 1.6* 1.3*  PROT 7.8 6.1*  ALBUMIN 4.2 3.4*    Recent Labs Lab 08/21/16 1546  LIPASE 23   No results for input(s): AMMONIA in the last 168 hours. CBC:  Recent Labs Lab 08/21/16 1546 08/23/16 0347 08/24/16 0650  WBC 9.4 3.5* 7.8  NEUTROABS 8.0* 3.1 6.2  HGB 12.9 10.4* 10.1*  HCT 39.2 33.2* 31.0*  MCV 86.0 88.5 87.3  PLT 324 248 245   Cardiac Enzymes:  Recent Labs Lab 08/27/16 0539  TROPONINI <0.03   BNP: Invalid input(s): POCBNP CBG: No results for input(s): GLUCAP in the last 168 hours. D-Dimer No results for input(s): DDIMER in the last 72 hours. Hgb A1c No results for input(s): HGBA1C in the last 72 hours. Lipid Profile No results for input(s): CHOL, HDL, LDLCALC, TRIG, CHOLHDL, LDLDIRECT in the last 72 hours. Thyroid function studies No results for input(s): TSH, T4TOTAL, T3FREE, THYROIDAB in the last 72 hours.  Invalid input(s): FREET3 Anemia work up No results for input(s): VITAMINB12, FOLATE, FERRITIN, TIBC, IRON, RETICCTPCT in the last 72 hours. Urinalysis    Component Value Date/Time   COLORURINE YELLOW 08/21/2016 1700   APPEARANCEUR CLEAR 08/21/2016 1700   LABSPEC 1.012 08/21/2016 1700   PHURINE 6.0 08/21/2016 1700   GLUCOSEU NEGATIVE 08/21/2016 1700   HGBUR MODERATE (A) 08/21/2016 1700   BILIRUBINUR NEGATIVE 08/21/2016 1700   KETONESUR 5 (A) 08/21/2016 1700   PROTEINUR NEGATIVE 08/21/2016 1700   UROBILINOGEN 0.2 04/30/2011 1145   NITRITE NEGATIVE 08/21/2016 1700   LEUKOCYTESUR NEGATIVE 08/21/2016 1700   Sepsis Labs Invalid input(s): PROCALCITONIN,  WBC,  LACTICIDVEN Microbiology No results found for this or any previous visit (from the past 240 hour(s)).   Time coordinating discharge: Over 30 minutes  SIGNED:   Tawni Millers, MD  [facility] 08/27/2016, 10:47 AM Pager   If 7PM-7AM, please contact  night-coverage [email or web address] Password [email or web address]

## 2016-08-27 NOTE — Care Management Important Message (Signed)
Important Message  Patient Details  Name: Christina Peterson MRN: [id number] Date of Birth: 09/17/1940   Medicare Important Message Given:  Yes    Kerin Salen 08/27/2016, 11:35 AMImportant Message  Patient Details  Name: Christina Peterson MRN: [id number] Date of Birth: [birth year]   Medicare Important Message Given:  Yes    Kerin Salen 08/27/2016, 11:35 AM

## 2016-08-27 NOTE — Progress Notes (Signed)
RN paged because pt awoke with central chest pain, 6/10, non radiating. States pain felt like a "nausea bubble" in center of chest. EKG being performed and RN went ahead and sent a troponin because she had to draw labs via port this am. ASA 81mg  chewable x 2 given. NP to bedside. S: Chest pain is gone. Pt states "it moved on down to my stomach". (she is here with c/o abdominal pain). No diaphoresis or radiation of pain when occurred. Denies any hx of "heart trouble".  O: Chronically ill appearing elderly AAF in NAD. Getting OOB when NP arrived to go to bathroom. Alert, oriented. Skin: dry, warm.  A/P: 1. Chest pain, atypical, resolved. Pain was resolved prior to NTG having time to work. RN went ahead and gave the ASA NP ordered. Troponin pending, but do not think necessary. Will not cycle. 12 lead EKG without acute changes.  KJKG, NP [facility]

## 2016-08-28 ENCOUNTER — Encounter: Payer: Self-pay | Admitting: Hematology

## 2016-08-28 ENCOUNTER — Other Ambulatory Visit: Payer: Self-pay | Admitting: *Deleted

## 2016-08-28 ENCOUNTER — Telehealth: Payer: Self-pay | Admitting: Hematology

## 2016-08-28 ENCOUNTER — Other Ambulatory Visit: Payer: Self-pay | Admitting: Hematology

## 2016-08-28 ENCOUNTER — Ambulatory Visit (HOSPITAL_BASED_OUTPATIENT_CLINIC_OR_DEPARTMENT_OTHER): Payer: Medicare Other

## 2016-08-28 VITALS — BP 160/80 | HR 76 | Temp 97.8°F | Resp 20

## 2016-08-28 DIAGNOSIS — Z5189 Encounter for other specified aftercare: Secondary | ICD-10-CM | POA: Diagnosis not present

## 2016-08-28 DIAGNOSIS — R112 Nausea with vomiting, unspecified: Secondary | ICD-10-CM

## 2016-08-28 DIAGNOSIS — C8338 Diffuse large B-cell lymphoma, lymph nodes of multiple sites: Secondary | ICD-10-CM

## 2016-08-28 DIAGNOSIS — R110 Nausea: Secondary | ICD-10-CM

## 2016-08-28 MED ORDER — PREDNISONE 20 MG PO TABS: 60.0000 mg | ORAL_TABLET | ORAL | 3 refills | Status: AC

## 2016-08-28 MED ORDER — ONDANSETRON 8 MG PO TBDP
8.0000 mg | ORAL_TABLET | Freq: Once | ORAL | Status: AC
  Administered 2016-08-28: 8 mg via ORAL
  Filled 2016-08-28: qty 1

## 2016-08-28 MED ORDER — ONDANSETRON HCL 8 MG PO TABS: 8.0000 mg | ORAL_TABLET | Freq: Three times a day (TID) | ORAL | 0 refills | Status: DC | PRN

## 2016-08-28 MED ORDER — ONDANSETRON 8 MG PO TBDP
8.0000 mg | ORAL_TABLET | Freq: Once | ORAL | Status: AC
  Filled 2016-08-28: qty 1

## 2016-08-28 MED ORDER — PROCHLORPERAZINE MALEATE 10 MG PO TABS: 10.0000 mg | ORAL_TABLET | Freq: Four times a day (QID) | ORAL | 0 refills | Status: DC | PRN

## 2016-08-28 MED ORDER — PEGFILGRASTIM INJECTION 6 MG/0.6ML ~~LOC~~
6.0000 mg | PREFILLED_SYRINGE | Freq: Once | SUBCUTANEOUS | Status: AC
  Administered 2016-08-28: 6 mg via SUBCUTANEOUS
  Filled 2016-08-28: qty 0.6

## 2016-08-28 MED ORDER — OXYCODONE-ACETAMINOPHEN 5-325 MG PO TABS: 1.0000 | ORAL_TABLET | ORAL | 0 refills | Status: DC | PRN

## 2016-08-28 NOTE — Patient Instructions (Signed)
Pegfilgrastim injection What is this medicine? PEGFILGRASTIM (PEG fil gra stim) is a long-acting granulocyte colony-stimulating factor that stimulates the growth of neutrophils, a type of white blood cell important in the body's fight against infection. It is used to reduce the incidence of fever and infection in patients with certain types of cancer who are receiving chemotherapy that affects the bone marrow, and to increase survival after being exposed to high doses of radiation. This medicine may be used for other purposes; ask your health care provider or pharmacist if you have questions. COMMON BRAND NAME(S): Neulasta What should I tell my health care provider before I take this medicine? They need to know if you have any of these conditions: -kidney disease -latex allergy -ongoing radiation therapy -sickle cell disease -skin reactions to acrylic adhesives (On-Body Injector only) -an unusual or allergic reaction to pegfilgrastim, filgrastim, other medicines, foods, dyes, or preservatives -pregnant or trying to get pregnant -breast-feeding How should I use this medicine? This medicine is for injection under the skin. If you get this medicine at home, you will be taught how to prepare and give the pre-filled syringe or how to use the On-body Injector. Refer to the patient Instructions for Use for detailed instructions. Use exactly as directed. Tell your healthcare provider immediately if you suspect that the On-body Injector may not have performed as intended or if you suspect the use of the On-body Injector resulted in a missed or partial dose. It is important that you put your used needles and syringes in a special sharps container. Do not put them in a trash can. If you do not have a sharps container, call your pharmacist or healthcare provider to get one. Talk to your pediatrician regarding the use of this medicine in children. While this drug may be prescribed for selected conditions,  precautions do apply. Overdosage: If you think you have taken too much of this medicine contact a poison control center or emergency room at once. NOTE: This medicine is only for you. Do not share this medicine with others. What if I miss a dose? It is important not to miss your dose. Call your doctor or health care professional if you miss your dose. If you miss a dose due to an On-body Injector failure or leakage, a new dose should be administered as soon as possible using a single prefilled syringe for manual use. What may interact with this medicine? Interactions have not been studied. Give your health care provider a list of all the medicines, herbs, non-prescription drugs, or dietary supplements you use. Also tell them if you smoke, drink alcohol, or use illegal drugs. Some items may interact with your medicine. This list may not describe all possible interactions. Give your health care provider a list of all the medicines, herbs, non-prescription drugs, or dietary supplements you use. Also tell them if you smoke, drink alcohol, or use illegal drugs. Some items may interact with your medicine. What should I watch for while using this medicine? You may need blood work done while you are taking this medicine. If you are going to need a MRI, CT scan, or other procedure, tell your doctor that you are using this medicine (On-Body Injector only). What side effects may I notice from receiving this medicine? Side effects that you should report to your doctor or health care professional as soon as possible: -allergic reactions like skin rash, itching or hives, swelling of the face, lips, or tongue -dizziness -fever -pain, redness, or irritation at site   where injected -pinpoint red spots on the skin -red or dark-brown urine -shortness of breath or breathing problems -stomach or side pain, or pain at the shoulder -swelling -tiredness -trouble passing urine or change in the amount of urine Side  effects that usually do not require medical attention (report to your doctor or health care professional if they continue or are bothersome): -bone pain -muscle pain This list may not describe all possible side effects. Call your doctor for medical advice about side effects. You may report side effects to FDA at 1-800-FDA-1088. Where should I keep my medicine? Keep out of the reach of children. Store pre-filled syringes in a refrigerator between 2 and 8 degrees C (36 and 46 degrees F). Do not freeze. Keep in carton to protect from light. Throw away this medicine if it is left out of the refrigerator for more than 48 hours. Throw away any unused medicine after the expiration date. NOTE: This sheet is a summary. It may not cover all possible information. If you have questions about this medicine, talk to your doctor, pharmacist, or health care provider.  2018 [employer] (2016-03-28 12:58:03)  

## 2016-08-28 NOTE — Telephone Encounter (Signed)
Lft the pt a vm to call back to schedule a hosp fu appt. I scheduled the pt an appt to see Dr. Irene Limbo on 5/21 at 12pm.

## 2016-09-02 ENCOUNTER — Ambulatory Visit (HOSPITAL_BASED_OUTPATIENT_CLINIC_OR_DEPARTMENT_OTHER): Payer: Medicare Other | Admitting: Hematology

## 2016-09-02 ENCOUNTER — Telehealth: Payer: Self-pay | Admitting: Cardiology

## 2016-09-02 ENCOUNTER — Telehealth: Payer: Self-pay | Admitting: Hematology

## 2016-09-02 ENCOUNTER — Encounter: Payer: Self-pay | Admitting: Hematology

## 2016-09-02 ENCOUNTER — Ambulatory Visit (HOSPITAL_BASED_OUTPATIENT_CLINIC_OR_DEPARTMENT_OTHER): Payer: Medicare Other

## 2016-09-02 VITALS — BP 162/81 | HR 61 | Temp 98.0°F | Resp 17 | Ht 61.0 in | Wt 109.7 lb

## 2016-09-02 DIAGNOSIS — R634 Abnormal weight loss: Secondary | ICD-10-CM | POA: Diagnosis not present

## 2016-09-02 DIAGNOSIS — I340 Nonrheumatic mitral (valve) insufficiency: Secondary | ICD-10-CM | POA: Diagnosis not present

## 2016-09-02 DIAGNOSIS — G893 Neoplasm related pain (acute) (chronic): Secondary | ICD-10-CM | POA: Diagnosis not present

## 2016-09-02 DIAGNOSIS — R627 Adult failure to thrive: Secondary | ICD-10-CM | POA: Diagnosis not present

## 2016-09-02 DIAGNOSIS — R630 Anorexia: Secondary | ICD-10-CM

## 2016-09-02 DIAGNOSIS — N289 Disorder of kidney and ureter, unspecified: Secondary | ICD-10-CM

## 2016-09-02 DIAGNOSIS — C8338 Diffuse large B-cell lymphoma, lymph nodes of multiple sites: Secondary | ICD-10-CM

## 2016-09-02 DIAGNOSIS — E43 Unspecified severe protein-calorie malnutrition: Secondary | ICD-10-CM

## 2016-09-02 LAB — CBC & DIFF AND RETIC
BASO%: 0.1 % (ref 0.0–2.0)
BASOS ABS: 0 10*3/uL (ref 0.0–0.1)
EOS%: 1.2 % (ref 0.0–7.0)
Eosinophils Absolute: 0.1 10*3/uL (ref 0.0–0.5)
HCT: 36.8 % (ref 34.8–46.6)
HGB: 11.9 g/dL (ref 11.6–15.9)
IMMATURE RETIC FRACT: 1.2 % — AB (ref 1.60–10.00)
LYMPH#: 0.4 10*3/uL — AB (ref 0.9–3.3)
LYMPH%: 5.3 % — AB (ref 14.0–49.7)
MCH: 28.3 pg (ref 25.1–34.0)
MCHC: 32.3 g/dL (ref 31.5–36.0)
MCV: 87.6 fL (ref 79.5–101.0)
MONO#: 0.3 10*3/uL (ref 0.1–0.9)
MONO%: 4.6 % (ref 0.0–14.0)
NEUT#: 6.1 10*3/uL (ref 1.5–6.5)
NEUT%: 88.8 % — AB (ref 38.4–76.8)
PLATELETS: 100 10*3/uL — AB (ref 145–400)
RBC: 4.2 10*6/uL (ref 3.70–5.45)
RDW: 14.4 % (ref 11.2–14.5)
RETIC CT ABS: 9.66 10*3/uL — AB (ref 33.70–90.70)
Retic %: <0.38 % — ABNORMAL LOW (ref 0.5–1.5)
WBC: 6.8 10*3/uL (ref 3.9–10.3)

## 2016-09-02 LAB — COMPREHENSIVE METABOLIC PANEL
ALK PHOS: 211 U/L — AB (ref 40–150)
ALT: 78 U/L — AB (ref 0–55)
ANION GAP: 11 meq/L (ref 3–11)
AST: 29 U/L (ref 5–34)
Albumin: 3.7 g/dL (ref 3.5–5.0)
BILIRUBIN TOTAL: 2.88 mg/dL — AB (ref 0.20–1.20)
BUN: 12.3 mg/dL (ref 7.0–26.0)
CALCIUM: 9.9 mg/dL (ref 8.4–10.4)
CO2: 34 meq/L — AB (ref 22–29)
CREATININE: 0.7 mg/dL (ref 0.6–1.1)
Chloride: 86 mEq/L — ABNORMAL LOW (ref 98–109)
Glucose: 117 mg/dl (ref 70–140)
Potassium: 3.3 mEq/L — ABNORMAL LOW (ref 3.5–5.1)
Sodium: 131 mEq/L — ABNORMAL LOW (ref 136–145)
TOTAL PROTEIN: 6.5 g/dL (ref 6.4–8.3)

## 2016-09-02 LAB — MAGNESIUM: Magnesium: 1.6 mg/dl (ref 1.5–2.5)

## 2016-09-02 MED ORDER — SENNOSIDES-DOCUSATE SODIUM 8.6-50 MG PO TABS: 2.0000 | ORAL_TABLET | Freq: Two times a day (BID) | ORAL | 2 refills | Status: AC

## 2016-09-02 MED ORDER — OXYCODONE HCL ER 10 MG PO T12A: 10.0000 mg | EXTENDED_RELEASE_TABLET | Freq: Two times a day (BID) | ORAL | 0 refills | Status: DC

## 2016-09-02 MED ORDER — OXYCODONE HCL 5 MG PO TABS: 5.0000 mg | ORAL_TABLET | ORAL | 0 refills | Status: DC | PRN

## 2016-09-02 NOTE — Telephone Encounter (Signed)
Scheduled appt per 5/21 los - Gave patient AVS and calender per 5/21 los

## 2016-09-02 NOTE — Progress Notes (Signed)
[name] Kitchen    HEMATOLOGY/ONCOLOGY CONSULTATION NOTE  Date of Service: 09/02/2016  Patient Care Team: Julius Bowels, MD (Inactive) as PCP - General (Professional Counselor)  CHIEF COMPLAINTS/PURPOSE OF CONSULTATION:  Post hospitalization f/u for DLBCL Renal cell carcinoma ?  HISTORY OF PRESENTING ILLNESS:  Christina Peterson is a wonderful 76 y.o. female who has been referred to [place] by Dr Cathlean Sauer, Jimmy Picket,*   for evaluation and management of newly diagnosed Large B cell lymphoma.  Patient has a history of hypertension, dyslipidemia, arthritis but notes that she lives independently in a senior housing facility and does all her ADLs herself.  She notes that she has been having increasing abdominal pain and mid back pain for about 3-4 weeks and has had some increased fatigue over the last couple of months. She notes that she has lost about 7-8 pounds over the last 2-3 weeks. She was recently admitted to the hospital from 08/10/2016-08/15/2016 with abdominal pain and poor oral intake. She was noted to be dehydrated with hyponatremia and CT scan of the abdomen on 08/12/2016 showed lymphadenopathy within the chest and abdomen concerning for lymphoma. She also was noted to have a right renal mass in the lower pole of the right kidney measuring 3.6 cm concerning for renal cell carcinoma.  Patient had a CT-guided biopsy of her retroperitoneal lymph node on 08/14/2016 which shows large B-cell lymphoma. She was to be seen in the oncology clinic but got readmitted on 08/21/2016 with worsening abdominal pain nausea and decreased appetite.  She was noted to be significantly hyponatremic again and is currently on IV fluids. She notes her abdominal pain is better controlled with pain medications she is receiving. No fevers or chills. I was consulted to help with further evaluation and management of her newly diagnosed large B-cell lymphoma.  Patient notes abdominal and back pain and about an 8-10 pound weight  loss in the last few weeks and inability to keep much food down due to pain and anorexia. She has been living by herself and demonstrated failure to thrive.  We discussed the diagnosis, natural history, prognosis and treatment options in detail including additional workup including a PET scan, need for a Port-A-Cath and different chemotherapy options possible adverse effects and limitations 2 doses of chemotherapy that can be used as a result of her age. Patient understands and is able to repeat back the understanding of these elements of care and would like to proceed with the most appropriate treatment.  She received her first cycle of R-mini CHOP on 08/26/2016 and had outpatient Neulasta shot since then.  INTERVAL HISTORY  Patient is here for her one-week follow-up for labs and a toxicity check. She notes she hasn't had a bowel movement in about 4-5 days despite taking MiraLAX every day. She notes that she is having to take oxycodone 5 mg every 4 hours but her abdominal pain is not well controlled. Changes were made in her pain management. No overt nausea. Notes anorexia. Has been set up for home health services. Is accompanied by her other family members were trying to help her. Notes that she has lost about 5 pounds in the last week. Was given an urgent nutritional therapy consultation. Plan the PET/CT scan has been ordered and is currently pending. Labs obtained today.  MEDICAL HISTORY:  Past Medical History:  Diagnosis Date  . Arthritis   . Hypercholesteremia   . Hypertension    FOLLOWED BY DR Katherine Roan    SURGICAL HISTORY: Past Surgical History:  Procedure Laterality Date  . ABDOMINAL HYSTERECTOMY    . IR FLUORO GUIDE PORT INSERTION RIGHT  08/23/2016  . IR US GUIDE VASC ACCESS RIGHT  08/23/2016  . JOINT REPLACEMENT    . 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 TOTAL HIP REVISION  05/02/2011   Procedure: TOTAL HIP REVISION;  Surgeon: Sharmon Revere, MD;  Location: [facility];   Service: Orthopedics;  Laterality: Left;  left total hip revision with bone grafting    SOCIAL HISTORY: Social History   Social History  . Marital status: Legally Separated    Spouse name: N/A  . Number of children: N/A  . Years of education: N/A   Occupational History  . Not on file.   Social History Main Topics  . Smoking status: Never Smoker  . Smokeless tobacco: Never Used  . Alcohol use No  . Drug use: No  . Sexual activity: Not on file   Other Topics Concern  . Not on file   Social History Narrative  . No narrative on file    FAMILY HISTORY: No family history on file.  ALLERGIES:  has No Known Allergies.  MEDICATIONS:  Current Outpatient Prescriptions  Medication Sig Dispense Refill  . allopurinol (ZYLOPRIM) 100 MG tablet Take 1 tablet (100 mg total) by mouth 2 (two) times daily. 60 tablet 0  . Calcium Citrate-Vitamin D (CITRACAL MAXIMUM PO) Take 2 tablets by mouth daily.     [name] Kitchen docusate sodium (COLACE) 100 MG capsule Take 1 capsule (100 mg total) by mouth every 12 (twelve) hours. 60 capsule 1  . feeding supplement (BOOST / RESOURCE BREEZE) LIQD Take 1 Container by mouth 2 (two) times daily between meals. (Patient not taking: Reported on 08/21/2016) 15000 mL 0  . feeding supplement, ENSURE ENLIVE, (ENSURE ENLIVE) LIQD Take 237 mLs by mouth 2 (two) times daily between meals. (Patient not taking: Reported on 08/21/2016) 4500 mL 3  . losartan (COZAAR) 50 MG tablet Take 1 tablet (50 mg total) by mouth daily. 30 tablet 1  . meloxicam (MOBIC) 7.5 MG tablet Take 7.5 mg by mouth daily.     . ondansetron (ZOFRAN) 8 MG tablet Take 1 tablet (8 mg total) by mouth every 8 (eight) hours as needed for nausea or vomiting. 30 tablet 0  . oxyCODONE-acetaminophen (PERCOCET/ROXICET) 5-325 MG tablet Take 1 tablet by mouth every 4 (four) hours as needed for severe pain. 30 tablet 0  . pantoprazole (PROTONIX) 20 MG tablet Take 1 tablet (20 mg total) by mouth daily. 30 tablet 0  .  polyethylene glycol (MIRALAX / GLYCOLAX) packet Take 17 g by mouth daily. 28 each 1  . predniSONE (DELTASONE) 20 MG tablet Take 3 tablets (60 mg total) by mouth See admin instructions. 60 mg po daily Day 1 to Day 5 of each chemotherapy cycle 30 tablet 3  . prochlorperazine (COMPAZINE) 10 MG tablet Take 1 tablet (10 mg total) by mouth every 6 (six) hours as needed for nausea or vomiting. 30 tablet 0  . simvastatin (ZOCOR) 10 MG tablet Take 10 mg by mouth at bedtime.      . sucralfate (CARAFATE) 1 g tablet Take 1 tablet (1 g total) by mouth 4 (four) times daily. 30 tablet 0   No current facility-administered medications for this visit.    Facility-Administered Medications Ordered in Other Visits  Medication Dose Route Frequency Provider Last Rate Last Dose  . ondansetron (ZOFRAN-ODT) disintegrating tablet 8 mg  8 mg Oral Once Eggleston,  Cloria Spring, MD        REVIEW OF SYSTEMS:    10 Point review of Systems was done is negative except as noted above.  PHYSICAL EXAMINATION: ECOG PERFORMANCE STATUS: 2 - Symptomatic, <50% confined to bed  . Vitals:   09/02/16 1203  BP: (!) 162/81  Pulse: 61  Resp: 17  Temp: 98 F (36.7 C)   Filed Weights   09/02/16 1203  Weight: 109 lb 11.2 oz (49.8 kg)   .Body mass index is 20.73 kg/m.  GENERAL:alert, in no acute distress and comfortable SKIN: no acute rashes, no significant lesions EYES: conjunctiva are pink and non-injected, sclera anicteric OROPHARYNX: MMM, no exudates, no oropharyngeal erythema or ulceration NECK: supple, no JVD LYMPH:  b/l palpable supraclavicular LN's, no palpable cervical LNadenopathy. LUNGS: clear to auscultation b/l with normal respiratory effort HEART: regular rate & rhythm, 3 x 6 systolic murmur over the sternum and aortic area  ABDOMEN:  normoactive bowel sounds , non tender, not distended. Extremity: no pedal edema PSYCH: alert & oriented x 3 with fluent speech NEURO: no focal motor/sensory  deficits   LABORATORY DATA:  I have reviewed the data as listed  . CBC Latest Ref Rng & Units 08/24/2016 08/23/2016 08/21/2016  WBC 4.0 - 10.5 K/uL 7.8 3.5(L) 9.4  Hemoglobin 12.0 - 15.0 g/dL 10.1(L) 10.4(L) 12.9  Hematocrit 36.0 - 46.0 % 31.0(L) 33.2(L) 39.2  Platelets 150 - 400 K/uL 245 248 324    . CMP Latest Ref Rng & Units 08/26/2016 08/25/2016 08/24/2016  Glucose 65 - 99 mg/dL 91 111(H) 88  BUN 6 - 20 mg/dL 6 8 7   Creatinine 0.44 - 1.00 mg/dL 0.44 0.49 0.53  Sodium 135 - 145 mmol/L 133(L) 132(L) 132(L)  Potassium 3.5 - 5.1 mmol/L 3.3(L) 3.9 3.4(L)  Chloride 101 - 111 mmol/L 94(L) 97(L) 97(L)  CO2 22 - 32 mmol/L 31 28 28   Calcium 8.9 - 10.3 mg/dL 9.5 9.3 9.5  Total Protein 6.5 - 8.1 g/dL - - -  Total Bilirubin 0.3 - 1.2 mg/dL - - -  Alkaline Phos 38 - 126 U/L - - -  AST 15 - 41 U/L - - -  ALT 14 - 54 U/L - - -    ------------------------------------------------------------------- LV EF: 60% - 65%  ------------------------------------------------------------------- Indications: (Chemo I74.9).  ------------------------------------------------------------------- History: PMH: No prior cardiac history. Risk factors: Dyslipidemia.  ------------------------------------------------------------------- Study Conclusions  - Left ventricle: The cavity size was normal. Wall thickness was normal. Systolic function was normal. The estimated ejection fraction was in the range of 60% to 65%. Features are consistent with a pseudonormal left ventricular filling pattern, with concomitant abnormal relaxation and increased filling pressure (grade 2 diastolic dysfunction). Doppler parameters are consistent with high ventricular filling pressure. - Aortic valve: There was trivial regurgitation. - Mitral valve: Prolapse of the posterior milral leaflet MR is anteriorly directed and wraps aound wall of LA MR is severe. - Left atrium: The atrium was severely  dilated. - Right atrium: The atrium was mildly to moderately dilated. - Pulmonary arteries: PA peak pressure: 49 mm Hg (S).  RADIOGRAPHIC STUDIES: I have personally reviewed the radiological images as listed and agreed with the findings in the report. Dg Abd 1 View  Result Date: 08/11/2016 CLINICAL DATA:  Nausea, constipation EXAM: ABDOMEN - 1 VIEW COMPARISON:  None. FINDINGS: The bowel gas pattern is normal. Phleboliths are noted within the pelvis. There is lower lumbar degenerative disc and facet arthropathy from approximately L4 caudad with osteoarthritis of the  sacroiliac joints bilaterally and pubic symphysis. The partially visualized left total hip arthroplasty is noted with the acetabular component placed slightly more cephalad than the normal expected location of the left acetabulum. 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 The right hip demonstrates native joint space narrowing. IMPRESSION: No significant stool burden. No bowel obstruction. 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 Electronically Signed   By: Ashley Royalty M.D.   On: 08/11/2016 03:15   Ct Chest W Contrast  Result Date: 08/12/2016 CLINICAL DATA:  Mid abdominal pain for 1 month. Worse with eating. MRI demonstrating adenopathy. EXAM: CT CHEST, ABDOMEN, AND PELVIS WITH CONTRAST TECHNIQUE: Multidetector CT imaging of the chest, abdomen and pelvis was performed following the standard protocol during bolus administration of intravenous contrast. CONTRAST:  34mL ISOVUE-300 IOPAMIDOL (ISOVUE-300) INJECTION 61% COMPARISON:  08/09/2016 abdominal MRI. FINDINGS: CT CHEST FINDINGS Cardiovascular: Tortuous thoracic aorta. Moderate cardiomegaly, without pericardial effusion. No central pulmonary embolism, on this non-dedicated study. Mediastinum/Nodes: No  supraclavicular adenopathy. Subcentimeter right thyroid nodule is nonspecific. No middle mediastinal or hilar adenopathy in. Necrotic adenopathy in the prevascular space measures 2.2 by 2.0 cm on image 20/series 2. Lungs/Pleura: Small bilateral pleural effusions. Mild bibasilar atelectasis. Musculoskeletal: No acute osseous abnormality. Bilateral glenohumeral joint osteoarthritis. CT ABDOMEN PELVIS FINDINGS Hepatobiliary: Normal liver. Normal gallbladder, without biliary ductal dilatation. Pancreas: Pancreatic atrophy. Pancreatic head displaced anteriorly by portacaval adenopathy. No pancreatic duct dilatation. Spleen: Hypoattenuating splenic lesions, larger of which measures 2.1 cm. No splenomegaly. Adrenals/Urinary Tract: Normal adrenal glands. Heterogeneous lower pole right renal exophytic 3.6 cm mass. Normal left kidney. No hydronephrosis. Degraded evaluation of the pelvis, secondary to beam hardening artifact from left hip arthroplasty. Grossly normal urinary bladder. Stomach/Bowel: Gastric antrum displaced anteriorly by abdominal adenopathy. No obstruction. Normal colon and terminal ileum. Normal small bowel caliber. Vascular/Lymphatic: Aortic and branch vessel atherosclerosis. Extensive abdominal adenopathy. Nodal mass in the portal caval space measures 4.4 x 5.6 cm on image 57/series 2. Retroperitoneal adenopathy, with a necrotic retrocaval node measuring 3.3 x 4.2 cm on image 66/series 2. Extensive small bowel mesenteric adenopathy. Pelvic sidewalls not well evaluated. Reproductive: Hysterectomy.  No gross adnexal mass. Other: No significant free fluid. Musculoskeletal: Left hip arthroplasty.  Right hip osteoarthritis. IMPRESSION: 1. Adenopathy within the chest and abdomen, most consistent with lymphoma. 2. Degraded evaluation of the pelvis, secondary to beam hardening artifact from left hip arthroplasty 3. Right renal mass, suspicious for renal cell carcinoma. Please see recent abdominal MRI for further  description. 4. Small bilateral pleural effusions. 5.  Aortic atherosclerosis. Electronically Signed   By: Abigail Miyamoto M.D.   On: 08/12/2016 13:30   Mr Jeri Cos WU Contrast  Result Date: 08/22/2016 CLINICAL DATA:  Non-Hodgkin's lymphoma, abdominal pain, and weight loss. EXAM: MRI HEAD WITHOUT AND WITH CONTRAST TECHNIQUE: Multiplanar, multiecho pulse sequences of the brain and surrounding structures were obtained without and with intravenous contrast. CONTRAST:  88mL MULTIHANCE GADOBENATE DIMEGLUMINE 529 MG/ML IV SOLN COMPARISON:  None. FINDINGS: Brain: No evidence for acute infarction, hemorrhage, mass lesion, hydrocephalus, or extra-axial fluid. Mild atrophy. Mild subcortical and periventricular T2 and FLAIR hyperintensities, likely chronic microvascular ischemic change. Post infusion, no abnormal enhancement of the brain or meninges. Partial empty sella. Vascular: Normal flow voids. Skull and upper cervical spine: Normal marrow signal. Cervical spondylosis. Sinuses/Orbits: Chronic sinus disease. No acute orbital findings or masses. Other: None.  IMPRESSION: Mild atrophy and small vessel disease. No acute intracranial findings. No abnormal postcontrast enhancement or osseous changes to suggest lymphoma within or surrounding the visualized CNS. Electronically Signed   By: Staci Righter M.D.   On: 08/22/2016 18:47   Mr Abdomen W Or Wo Contrast  Result Date: 08/09/2016 CLINICAL DATA:  Abdominal mass. Abdominal pain. Nausea and vomiting. EXAM: MRI ABDOMEN WITHOUT AND WITH CONTRAST TECHNIQUE: Multiplanar multisequence MR imaging of the abdomen was performed both before and after the administration of intravenous contrast. CONTRAST:  40mL MULTIHANCE GADOBENATE DIMEGLUMINE 529 MG/ML IV SOLN COMPARISON:  Ultrasound 08/09/2016 FINDINGS: Lower chest: Lung bases are clear. Hepatobiliary: No focal hepatic lesion. Large nodal mass in the porta hepatis measuring 6.0 x 4.2 cm is most consistent with large periportal lymph  node. There is no biliary duct dilatation. Gallbladder normal. Common bile duct is difficult to follow. Portal veins are patent. Pancreas: Pancreas is difficult to evaluate. There is bulky adenopathy within the mesenteries adjacent the pancreas which makes defining the pancreatic tissue difficult. No discrete pancreatic lesion is identified. Spleen: Single 1.6 cm lesion within the spleen. Cannot exclude lymphoma lesion within the spleen. Spleen is normal volume.) Adrenals/urinary tract: Adrenal glands are normal. Along the lateral cortex of the inferior RIGHT kidney there is a 3.4 x 4.0 cm enhancing mass (image 55, series 1501. This is clearly depicted on T2 weighted image 34, series 3. Stomach/Bowel: Stomach, small-bowel in limited view the: Unremarkable. No bowel obstruction Vascular/Lymphatic: Abdominal aorta normal caliber. There is bulky adenopathy within the small bowel mesenteries centrally. Coronal image 12 series 10 and demonstrates no masses well. Mass lesions also demonstrated well on the eighth diffusion-weighted Ob 500 series. Example noted the LEFT upper abdomen measures 3.1 cm short axis. Course periportal nodes again demonstrated. Retroperitoneal nodes LEFT of the aorta measure 1.6 cm short axis (image 51, series 4. Likely large. Caval lymph node measuring 3.0 cm (image 54, series 4 Other: No free fluid. Musculoskeletal: No aggressive osseous lesion. IMPRESSION: 1. 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 Retroperitoneal adenopathy extends along the aorta and IVC 2. Indeterminate lesion within normal volume spleen. 3. Recommend oncology consultation and FDG PET scan for further staging and biopsy planning. 4. Additional 4 cm lesion exophytic from the inferior pole the RIGHT kidney is concerning for RENAL CELL CARCINOMA. Electronically Signed   By: Suzy Bouchard M.D.   On: 08/09/2016 12:35   US Abdomen Complete  Result Date: 08/09/2016 CLINICAL DATA:   Abdominal pain for 3 months EXAM: ABDOMEN ULTRASOUND COMPLETE COMPARISON:  None. FINDINGS: Gallbladder: No gallstones or wall thickening visualized. No sonographic Murphy sign noted by sonographer. Common bile duct: Diameter: Normal caliber, 2 mm Liver: There is a 5.9 x 4.8 x 4.6 cm mixed echotexture solid lesion in the left hepatic lobe. Overall, the lesion is hypoechoic. This is nonspecific and warrants additional characterization. IVC: No abnormality visualized. Pancreas: Enlarged hypoechoic mass in the pancreatic tail measures 8.3 x 7.8 x 5.9 cm. Hypoechoic mass in the region the pancreatic head measures 3.2 x 2.2 x 4.5 cm. Spleen: Normal size. Hypoechoic lesion centrally within the spleen measures 1.9 x 1.5 x 1.3 cm. Right Kidney: Length: 8.1 cm. No hydronephrosis. There is a solid mass off the lower pole of the right kidney measuring 3.4 x 3.3 x 3.2 cm. 1.6 cm exophytic cyst off the midpole. Left Kidney: Length: 9.8 cm. Echogenicity within normal limits. No mass or hydronephrosis visualized. Abdominal aorta: No aneurysm visualized. Other findings:  Within the right lower quadrant, there is a solid mass measuring 5.8 x 3.7 x 3.2 cm of unknown etiology or source. This is adjacent to the aorta. IMPRESSION: Numerous solid organ abnormalities. 5.9 cm hypoechoic solid mass in the left hepatic lobe. At least 2 large masses in the pancreas, both pancreatic head and pancreatic tail. Hypoechoic lesions centrally within the spleen. 3.4 cm solid mass off the lower pole of the right kidney. In addition, there is a large soft tissue mass in the right lower quadrant of the abdomen adjacent to the aorta measuring up to 5.8 cm. These areas are concerning for malignancy and warrant further evaluation. Recommend MRI of the abdomen without and with contrast for further evaluation. Electronically Signed   By: Rolm Baptise M.D.   On: 08/09/2016 09:24   Ct Abdomen Pelvis W Contrast  Result Date: 08/12/2016 CLINICAL DATA:  Mid  abdominal pain for 1 month. Worse with eating. MRI demonstrating adenopathy. EXAM: CT CHEST, ABDOMEN, AND PELVIS WITH CONTRAST TECHNIQUE: Multidetector CT imaging of the chest, abdomen and pelvis was performed following the standard protocol during bolus administration of intravenous contrast. CONTRAST:  30mL ISOVUE-300 IOPAMIDOL (ISOVUE-300) INJECTION 61% COMPARISON:  08/09/2016 abdominal MRI. FINDINGS: CT CHEST FINDINGS Cardiovascular: Tortuous thoracic aorta. Moderate cardiomegaly, without pericardial effusion. No central pulmonary embolism, on this non-dedicated study. Mediastinum/Nodes: No supraclavicular adenopathy. Subcentimeter right thyroid nodule is nonspecific. No middle mediastinal or hilar adenopathy in. Necrotic adenopathy in the prevascular space measures 2.2 by 2.0 cm on image 20/series 2. Lungs/Pleura: Small bilateral pleural effusions. Mild bibasilar atelectasis. Musculoskeletal: No acute osseous abnormality. Bilateral glenohumeral joint osteoarthritis. CT ABDOMEN PELVIS FINDINGS Hepatobiliary: Normal liver. Normal gallbladder, without biliary ductal dilatation. Pancreas: Pancreatic atrophy. Pancreatic head displaced anteriorly by portacaval adenopathy. No pancreatic duct dilatation. Spleen: Hypoattenuating splenic lesions, larger of which measures 2.1 cm. No splenomegaly. Adrenals/Urinary Tract: Normal adrenal glands. Heterogeneous lower pole right renal exophytic 3.6 cm mass. Normal left kidney. No hydronephrosis. Degraded evaluation of the pelvis, secondary to beam hardening artifact from left hip arthroplasty. Grossly normal urinary bladder. Stomach/Bowel: Gastric antrum displaced anteriorly by abdominal adenopathy. No obstruction. Normal colon and terminal ileum. Normal small bowel caliber. Vascular/Lymphatic: Aortic and branch vessel atherosclerosis. Extensive abdominal adenopathy. Nodal mass in the portal caval space measures 4.4 x 5.6 cm on image 57/series 2. Retroperitoneal adenopathy,  with a necrotic retrocaval node measuring 3.3 x 4.2 cm on image 66/series 2. Extensive small bowel mesenteric adenopathy. Pelvic sidewalls not well evaluated. Reproductive: Hysterectomy.  No gross adnexal mass. Other: No significant free fluid. Musculoskeletal: Left hip arthroplasty.  Right hip osteoarthritis. IMPRESSION: 1. Adenopathy within the chest and abdomen, most consistent with lymphoma. 2. Degraded evaluation of the pelvis, secondary to beam hardening artifact from left hip arthroplasty 3. Right renal mass, suspicious for renal cell carcinoma. Please see recent abdominal MRI for further description. 4. Small bilateral pleural effusions. 5.  Aortic atherosclerosis. Electronically Signed   By: Abigail Miyamoto M.D.   On: 08/12/2016 13:30   Ir US Guide Vasc Access Right  Result Date: 08/23/2016 CLINICAL DATA:  Lymphoma, access for chemotherapy EXAM: RIGHT INTERNAL JUGULAR SINGLE LUMEN POWER PORT CATHETER INSERTION Date:  5/11/20185/02/2017 4:19 pm Radiologist:  M. Daryll Brod, MD Guidance:  Ultrasound and fluoroscopic MEDICATIONS: Ancef 2 g; The antibiotic was administered within an appropriate time interval prior to skin puncture. ANESTHESIA/SEDATION: Versed 2.0 mg IV; Fentanyl 75 mcg IV; Moderate Sedation Time:  24 minutes The patient was continuously monitored during the procedure by the interventional radiology  nurse under my direct supervision. FLUOROSCOPY TIME:  54 seconds (4 mGy) COMPLICATIONS: None immediate. CONTRAST:  None. PROCEDURE: Informed consent was obtained from the patient following explanation of the procedure, risks, benefits and alternatives. The patient understands, agrees and consents for the procedure. All questions were addressed. A time out was performed. Maximal barrier sterile technique utilized including caps, mask, sterile gowns, sterile gloves, large sterile drape, hand hygiene, and 2% chlorhexidine scrub. Under sterile conditions and local anesthesia, right internal jugular  micropuncture venous access was performed. Access was performed with ultrasound. Images were obtained for documentation. A guide wire was inserted followed by a transitional dilator. This allowed insertion of a guide wire and catheter into the IVC. Measurements were obtained from the SVC / RA junction back to the right IJ venotomy site. In the right infraclavicular chest, a subcutaneous pocket was created over the second anterior rib. This was done under sterile conditions and local anesthesia. 1% lidocaine with epinephrine was utilized for this. A 2.5 cm incision was made in the skin. Blunt dissection was performed to create a subcutaneous pocket over the right pectoralis major muscle. The pocket was flushed with saline vigorously. There was adequate hemostasis. The port catheter was assembled and checked for leakage. The port catheter was secured in the pocket with two retention sutures. The tubing was tunneled subcutaneously to the right venotomy site and inserted into the SVC/RA junction through a valved peel-away sheath. Position was confirmed with fluoroscopy. Images were obtained for documentation. The patient tolerated the procedure well. No immediate complications. Incisions were closed in a two layer fashion with 4 - 0 Vicryl suture. Dermabond was applied to the skin. The port catheter was accessed, blood was aspirated followed by saline and heparin flushes. Needle was removed. A dry sterile dressing was applied. IMPRESSION: Ultrasound and fluoroscopically guided right internal jugular single lumen power port catheter insertion. Tip in the SVC/RA junction. Catheter ready for use. Electronically Signed   By: Jerilynn Mages.  Shick M.D.   On: 08/23/2016 16:21   Ct Biopsy  Result Date: 08/14/2016 INDICATION: No known primary, now with indeterminate right-sided renal lesion and retroperitoneal lymphadenopathy. Please perform CT-guided biopsy for tissue diagnostic purposes. EXAM: CT-GUIDED BIOPSY OF INDETERMINATE  RIGHT-SIDED RETROPERITONEAL LYMPHADENOPATHY. COMPARISON:  CT the chest, abdomen and pelvis - 07/16/2016 MEDICATIONS: None. ANESTHESIA/SEDATION: Fentanyl 100 mcg IV; Versed 2 mg IV Sedation time: 15 minutes; The patient was continuously monitored during the procedure by the interventional radiology nurse under my direct supervision. CONTRAST:  None. COMPLICATIONS: None immediate. PROCEDURE: Informed consent was obtained from the patient following an explanation of the procedure, risks, benefits and alternatives. A time out was performed prior to the initiation of the procedure. The patient was positioned prone on the CT table and a limited CT was performed for procedural planning demonstrating unchanged size and appearance of the dominant right-sided retroperitoneal nodal conglomeration with dominant component measuring approximately 3.5 x 2.9 cm (image 25, series 3). The procedure was planned. The operative site was prepped and draped in the usual sterile fashion. Appropriate trajectory was confirmed with a 22 gauge spinal needle after the adjacent tissues were anesthetized with 1% Lidocaine with epinephrine. Under intermittent CT guidance, a 17 gauge coaxial needle was advanced into the peripheral aspect of the mass. Appropriate positioning was confirmed and 5 core needle biopsy samples were obtained with an 18 gauge core needle biopsy device. The co-axial needle was removed and hemostasis was achieved with manual compression. A limited postprocedural CT was negative for hemorrhage or  additional complication. A dressing was placed. The patient tolerated the procedure well without immediate postprocedural complication. IMPRESSION: Technically successful CT guided core needle biopsy of indeterminate right-sided retroperitoneal nodal conglomeration. Electronically Signed   By: Sandi Mariscal M.D.   On: 08/14/2016 13:50   Ir Fluoro Guide Port Insertion Right  Result Date: 08/23/2016 CLINICAL DATA:  Lymphoma, access  for chemotherapy EXAM: RIGHT INTERNAL JUGULAR SINGLE LUMEN POWER PORT CATHETER INSERTION Date:  5/11/20185/02/2017 4:19 pm Radiologist:  M. Daryll Brod, MD Guidance:  Ultrasound and fluoroscopic MEDICATIONS: Ancef 2 g; The antibiotic was administered within an appropriate time interval prior to skin puncture. ANESTHESIA/SEDATION: Versed 2.0 mg IV; Fentanyl 75 mcg IV; Moderate Sedation Time:  24 minutes The patient was continuously monitored during the procedure by the interventional radiology nurse under my direct supervision. FLUOROSCOPY TIME:  54 seconds (4 mGy) COMPLICATIONS: None immediate. CONTRAST:  None. PROCEDURE: Informed consent was obtained from the patient following explanation of the procedure, risks, benefits and alternatives. The patient understands, agrees and consents for the procedure. All questions were addressed. A time out was performed. Maximal barrier sterile technique utilized including caps, mask, sterile gowns, sterile gloves, large sterile drape, hand hygiene, and 2% chlorhexidine scrub. Under sterile conditions and local anesthesia, right internal jugular micropuncture venous access was performed. Access was performed with ultrasound. Images were obtained for documentation. A guide wire was inserted followed by a transitional dilator. This allowed insertion of a guide wire and catheter into the IVC. Measurements were obtained from the SVC / RA junction back to the right IJ venotomy site. In the right infraclavicular chest, a subcutaneous pocket was created over the second anterior rib. This was done under sterile conditions and local anesthesia. 1% lidocaine with epinephrine was utilized for this. A 2.5 cm incision was made in the skin. Blunt dissection was performed to create a subcutaneous pocket over the right pectoralis major muscle. The pocket was flushed with saline vigorously. There was adequate hemostasis. The port catheter was assembled and checked for leakage. The port catheter  was secured in the pocket with two retention sutures. The tubing was tunneled subcutaneously to the right venotomy site and inserted into the SVC/RA junction through a valved peel-away sheath. Position was confirmed with fluoroscopy. Images were obtained for documentation. The patient tolerated the procedure well. No immediate complications. Incisions were closed in a two layer fashion with 4 - 0 Vicryl suture. Dermabond was applied to the skin. The port catheter was accessed, blood was aspirated followed by saline and heparin flushes. Needle was removed. A dry sterile dressing was applied. IMPRESSION: Ultrasound and fluoroscopically guided right internal jugular single lumen power port catheter insertion. Tip in the SVC/RA junction. Catheter ready for use. Electronically Signed   By: Jerilynn Mages.  Shick M.D.   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 Patient has extensive retroperitoneal mesenteric and periportal lymphadenopathy along with adenopathy in the chest and supraclavicular areas and a focal lesion in the spleen.  . Recent Labs       Lab Results  Component Value Date   LDH 321 (H) 08/22/2016     Patient received first cycle of R-mini CHOP on 08/26/2016 and received outpatient neulasta.  Plan -Patient notes some grade 1 fatigue and grade 1-2 anorexia. -Continue same supportive medications -Labs today. -No other overt toxicities. -We'll plan to have an initial  staging PET/CT scan within the next week. -next cycle of R-CHOP scheduled for 09/16/2016 with G-CSF support.   #2 Right lower pole of the kidney lesion ? Renal cell carcinoma versus involvement by lymphoma . Plan -Will hold off on biopsy at this time. -We'll reassess response of this lesion on repeat PET/CT scan after 3 cycles of treatment. If this resolves or shrinks would suggest diagnosis of  lymphoma. If persistent or increased in size we will need to decide on appropriate management for this.  #3 significant fatigue and failure to thrive due to poor oral intake related lymphoma and type B constitutional symptoms . Plan -Optimizing pain management. -Laxatives to address constipation - continue MiraLAX added senna S. -Given referral to [facility]/dietitian to address her nutritional requirements.  #4 neoplasm related pain currently uncontrolled. Has required oxycodone around the clock every 4 hours. Plan -We'll start the patient on OxyContin 10 mg by mouth every 12 hours -Oxycodone 5-10 mg every 4 hours as needed for breakthrough pain  #5 severe mitral regurgitation normal ejection fraction -Has been given a referral for outpatient cardiology on recent discharge from the hospital.  Labs today -PET/CT in 1 week Dietician referral - Ernestene Kiel urgently RTC with rpt labs and clinic appointment with C2D1 of R-CHOP on 09/16/2016   All of the patients questions were answered with apparent satisfaction. The patient knows to call the clinic with any problems, questions or concerns.  I spent 30 minutes counseling the patient face to face. The total time spent in the appointment was 50 minutes and more than 50% was on counseling and direct patient cares.    Sullivan Lone MD [place] AAHIVMS [facility] Hematology/Oncology Physician [facility]  (Office):       [phone] (Work cell):  [phone] (Fax):           [phone]  09/02/2016 12:03 PM

## 2016-09-02 NOTE — Patient Instructions (Signed)
Thank you for choosing [facility] to provide your oncology and hematology care.  To afford each patient quality time with our providers, please arrive 30 minutes before your scheduled appointment time.  If you arrive late for your appointment, you may be asked to reschedule.  We strive to give you quality time with our providers, and arriving late affects you and other patients whose appointments are after yours.   If you are a no show for multiple scheduled visits, you may be dismissed from the clinic at the providers discretion.    Again, thank you for choosing [facility], our hope is that these requests will decrease the amount of time that you wait before being seen by our physicians.  ______________________________________________________________________  Should you have questions after your visit to the [facility], please contact our office at [phone] between the hours of 8:30 and 4:30 p.m.    Voicemails left after 4:30p.m will not be returned until the following business day.    For prescription refill requests, please have your pharmacy contact us directly.  Please also try to allow 48 hours for prescription requests.    Please contact the scheduling department for questions regarding scheduling.  For scheduling of procedures such as PET scans, CT scans, MRI, Ultrasound, etc please contact central scheduling at [phone].    Resources For Cancer Patients and Caregivers:   Oncolink.org:  A wonderful resource for patients and healthcare providers for information regarding your disease, ways to tract your treatment, what to expect, etc.     American Cancer Society:  800-227-2345  Can help patients locate various types of support and financial assistance  Cancer Care: 1-800-813-HOPE (4673) Provides financial assistance, online support groups, medication/co-pay assistance.    [place] DSS:  336-641-3447 Where to apply for food  stamps, Medicaid, and utility assistance  Medicare Rights Center: 800-333-4114 Helps people with Medicare understand their rights and benefits, navigate the Medicare system, and secure the quality healthcare they deserve  SCAT: 336-333-6589 [place] [employer]'s shared-ride transportation service for eligible riders who have a disability that prevents them from riding the fixed route bus.    For additional information on assistance programs please contact our social worker:   Grier Hock/Abigail Elmore:  [phone]            

## 2016-09-02 NOTE — Telephone Encounter (Signed)
Received records from [facility] for appointment on 09/18/16 with Dr Ellyn Hack.  Records put with Dr Allison Quarry schedule for 09/18/16. lp

## 2016-09-03 LAB — PHOSPHORUS: Phosphorus, Ser: 2.4 mg/dL — ABNORMAL LOW (ref 2.5–4.5)

## 2016-09-05 ENCOUNTER — Telehealth: Payer: Self-pay | Admitting: *Deleted

## 2016-09-05 ENCOUNTER — Other Ambulatory Visit: Payer: Self-pay | Admitting: *Deleted

## 2016-09-05 ENCOUNTER — Other Ambulatory Visit: Payer: Self-pay | Admitting: Hematology

## 2016-09-05 MED ORDER — POTASSIUM CHLORIDE CRYS ER 20 MEQ PO TBCR: 20.0000 meq | EXTENDED_RELEASE_TABLET | Freq: Every day | ORAL | 0 refills | Status: DC

## 2016-09-05 MED ORDER — MORPHINE SULFATE ER 15 MG PO TBCR: 15.0000 mg | EXTENDED_RELEASE_TABLET | Freq: Two times a day (BID) | ORAL | 0 refills | Status: DC

## 2016-09-05 MED FILL — POTASSIUM CL ER 20 MEQ TABL: 20 | 10 days supply | Qty: 10 | Fill #0

## 2016-09-05 MED FILL — MORPHINE SULF ER 15 MG TAB: 15 | 30 days supply | Qty: 60 | Fill #0

## 2016-09-05 NOTE — Telephone Encounter (Signed)
-----   Message from Brunetta Genera, MD sent at 09/04/2016 10:13 PM EDT ----- Delle Reining, COuld you please send prescription for KCL 11meq daily x 10 days to her pharmacy and let her know. Plz also have her avoid acetaminophen use. thx GK

## 2016-09-05 NOTE — Telephone Encounter (Signed)
Per staff message, LVM with patient asking for call back to discuss lab results.  Informed that rx would be sent to pharmacy.  Call back number provided.  Rx sent to pt preferred pharmacy.

## 2016-09-06 ENCOUNTER — Inpatient Hospital Stay (HOSPITAL_COMMUNITY)
Admission: EM | Admit: 2016-09-06 | Discharge: 2016-09-12 | DRG: 871 | Disposition: A | Discharge status: Home Health | Payer: Medicare Other | Attending: Internal Medicine | Admitting: Internal Medicine

## 2016-09-06 ENCOUNTER — Emergency Department (HOSPITAL_COMMUNITY): Payer: Medicare Other

## 2016-09-06 ENCOUNTER — Encounter (HOSPITAL_COMMUNITY): Payer: Self-pay | Admitting: Emergency Medicine

## 2016-09-06 ENCOUNTER — Other Ambulatory Visit: Payer: Self-pay

## 2016-09-06 DIAGNOSIS — R6520 Severe sepsis without septic shock: Secondary | ICD-10-CM

## 2016-09-06 DIAGNOSIS — Z79899 Other long term (current) drug therapy: Secondary | ICD-10-CM

## 2016-09-06 DIAGNOSIS — R748 Abnormal levels of other serum enzymes: Secondary | ICD-10-CM | POA: Diagnosis present

## 2016-09-06 DIAGNOSIS — R112 Nausea with vomiting, unspecified: Secondary | ICD-10-CM | POA: Diagnosis present

## 2016-09-06 DIAGNOSIS — E46 Unspecified protein-calorie malnutrition: Secondary | ICD-10-CM

## 2016-09-06 DIAGNOSIS — J690 Pneumonitis due to inhalation of food and vomit: Secondary | ICD-10-CM | POA: Diagnosis present

## 2016-09-06 DIAGNOSIS — D649 Anemia, unspecified: Secondary | ICD-10-CM | POA: Diagnosis present

## 2016-09-06 DIAGNOSIS — E785 Hyperlipidemia, unspecified: Secondary | ICD-10-CM | POA: Diagnosis present

## 2016-09-06 DIAGNOSIS — A419 Sepsis, unspecified organism: Secondary | ICD-10-CM | POA: Diagnosis present

## 2016-09-06 DIAGNOSIS — Y95 Nosocomial condition: Secondary | ICD-10-CM | POA: Diagnosis present

## 2016-09-06 DIAGNOSIS — E871 Hypo-osmolality and hyponatremia: Secondary | ICD-10-CM

## 2016-09-06 DIAGNOSIS — K567 Ileus, unspecified: Secondary | ICD-10-CM

## 2016-09-06 DIAGNOSIS — I340 Nonrheumatic mitral (valve) insufficiency: Secondary | ICD-10-CM | POA: Diagnosis present

## 2016-09-06 DIAGNOSIS — Z7189 Other specified counseling: Secondary | ICD-10-CM

## 2016-09-06 DIAGNOSIS — M1990 Unspecified osteoarthritis, unspecified site: Secondary | ICD-10-CM | POA: Diagnosis present

## 2016-09-06 DIAGNOSIS — N179 Acute kidney failure, unspecified: Secondary | ICD-10-CM

## 2016-09-06 DIAGNOSIS — G9340 Encephalopathy, unspecified: Secondary | ICD-10-CM | POA: Diagnosis present

## 2016-09-06 DIAGNOSIS — E860 Dehydration: Secondary | ICD-10-CM | POA: Diagnosis not present

## 2016-09-06 DIAGNOSIS — R109 Unspecified abdominal pain: Secondary | ICD-10-CM

## 2016-09-06 DIAGNOSIS — Z7952 Long term (current) use of systemic steroids: Secondary | ICD-10-CM

## 2016-09-06 DIAGNOSIS — E876 Hypokalemia: Secondary | ICD-10-CM

## 2016-09-06 DIAGNOSIS — K56609 Unspecified intestinal obstruction, unspecified as to partial versus complete obstruction: Secondary | ICD-10-CM

## 2016-09-06 DIAGNOSIS — R627 Adult failure to thrive: Secondary | ICD-10-CM | POA: Diagnosis present

## 2016-09-06 DIAGNOSIS — K5900 Constipation, unspecified: Secondary | ICD-10-CM | POA: Diagnosis present

## 2016-09-06 DIAGNOSIS — Z79891 Long term (current) use of opiate analgesic: Secondary | ICD-10-CM

## 2016-09-06 DIAGNOSIS — C8338 Diffuse large B-cell lymphoma, lymph nodes of multiple sites: Secondary | ICD-10-CM | POA: Diagnosis present

## 2016-09-06 DIAGNOSIS — I509 Heart failure, unspecified: Secondary | ICD-10-CM | POA: Diagnosis present

## 2016-09-06 DIAGNOSIS — E7800 Pure hypercholesterolemia, unspecified: Secondary | ICD-10-CM | POA: Diagnosis present

## 2016-09-06 DIAGNOSIS — E43 Unspecified severe protein-calorie malnutrition: Secondary | ICD-10-CM | POA: Diagnosis present

## 2016-09-06 DIAGNOSIS — J189 Pneumonia, unspecified organism: Secondary | ICD-10-CM

## 2016-09-06 DIAGNOSIS — E861 Hypovolemia: Secondary | ICD-10-CM | POA: Diagnosis present

## 2016-09-06 DIAGNOSIS — R0902 Hypoxemia: Secondary | ICD-10-CM

## 2016-09-06 DIAGNOSIS — I4891 Unspecified atrial fibrillation: Secondary | ICD-10-CM | POA: Diagnosis present

## 2016-09-06 DIAGNOSIS — I110 Hypertensive heart disease with heart failure: Secondary | ICD-10-CM | POA: Diagnosis present

## 2016-09-06 LAB — COMPREHENSIVE METABOLIC PANEL
ALK PHOS: 154 U/L — AB (ref 38–126)
ALT: 39 U/L (ref 14–54)
ANION GAP: 21 — AB (ref 5–15)
AST: 26 U/L (ref 15–41)
Albumin: 3.8 g/dL (ref 3.5–5.0)
BILIRUBIN TOTAL: 1.8 mg/dL — AB (ref 0.3–1.2)
BUN: 35 mg/dL — ABNORMAL HIGH (ref 6–20)
CALCIUM: 9.9 mg/dL (ref 8.9–10.3)
CO2: 28 mmol/L (ref 22–32)
Chloride: 74 mmol/L — ABNORMAL LOW (ref 101–111)
Creatinine, Ser: 2.77 mg/dL — ABNORMAL HIGH (ref 0.44–1.00)
GFR calc Af Amer: 18 mL/min — ABNORMAL LOW (ref >60)
GFR calc non Af Amer: 15 mL/min — ABNORMAL LOW (ref >60)
GLUCOSE: 121 mg/dL — AB (ref 65–99)
Potassium: 3.8 mmol/L (ref 3.5–5.1)
SODIUM: 123 mmol/L — AB (ref 135–145)
TOTAL PROTEIN: 6.9 g/dL (ref 6.5–8.1)

## 2016-09-06 LAB — DIFFERENTIAL
BASOS ABS: 0 10*3/uL (ref 0.0–0.1)
BASOS PCT: 0 %
EOS PCT: 0 %
Eosinophils Absolute: 0 10*3/uL (ref 0.0–0.7)
LYMPHS ABS: 0.3 10*3/uL — AB (ref 0.7–4.0)
Lymphocytes Relative: 2 %
MONO ABS: 0.3 10*3/uL (ref 0.1–1.0)
MONOS PCT: 2 %
Neutro Abs: 13.8 10*3/uL — ABNORMAL HIGH (ref 1.7–7.7)
Neutrophils Relative %: 96 %
WBC MORPHOLOGY: INCREASED

## 2016-09-06 LAB — I-STAT CG4 LACTIC ACID, ED: Lactic Acid, Venous: 5.63 mmol/L (ref 0.5–1.9)

## 2016-09-06 LAB — CBC
HCT: 31.8 % — ABNORMAL LOW (ref 36.0–46.0)
HEMOGLOBIN: 10.5 g/dL — AB (ref 12.0–15.0)
MCH: 27.8 pg (ref 26.0–34.0)
MCHC: 33 g/dL (ref 30.0–36.0)
MCV: 84.1 fL (ref 78.0–100.0)
Platelets: 150 10*3/uL (ref 150–400)
RBC: 3.78 MIL/uL — ABNORMAL LOW (ref 3.87–5.11)
RDW: 15.1 % (ref 11.5–15.5)
WBC: 14.4 10*3/uL — ABNORMAL HIGH (ref 4.0–10.5)

## 2016-09-06 LAB — LIPASE, BLOOD: Lipase: 19 U/L (ref 11–51)

## 2016-09-06 MED ORDER — SODIUM CHLORIDE 0.9 % IV SOLN
1000.0000 mL | INTRAVENOUS | Status: DC
  Administered 2016-09-07: 1000 mL via INTRAVENOUS

## 2016-09-06 MED ORDER — CEFEPIME HCL 2 G IJ SOLR
2.0000 g | Freq: Once | INTRAMUSCULAR | Status: AC
  Administered 2016-09-07: 2 g via INTRAVENOUS
  Filled 2016-09-06: qty 2

## 2016-09-06 MED ORDER — MORPHINE SULFATE (PF) 2 MG/ML IV SOLN
4.0000 mg | Freq: Once | INTRAVENOUS | Status: AC
  Administered 2016-09-06: 4 mg via INTRAVENOUS
  Filled 2016-09-06: qty 2

## 2016-09-06 MED ORDER — VANCOMYCIN HCL IN DEXTROSE 1-5 GM/200ML-% IV SOLN
1000.0000 mg | Freq: Once | INTRAVENOUS | Status: AC
  Administered 2016-09-07: 1000 mg via INTRAVENOUS
  Filled 2016-09-06: qty 200

## 2016-09-06 NOTE — ED Notes (Signed)
IN and OUT CATH attempt was unsuccessful.  Bed pan provided for clean catch specimen.

## 2016-09-06 NOTE — ED Notes (Signed)
Writer attempted X 2 for recollect of blood, unsuccessful, RN notified

## 2016-09-06 NOTE — ED Notes (Signed)
Writer notified EDP Plunkett of abnormal I-stat lactic 5.63.

## 2016-09-06 NOTE — ED Triage Notes (Signed)
Brought in by EMS from home with c/o generalized weakness.  Pt has stage 3 lymphoma--- has had her 1st chemo on the 14th of this month.  Since her chemotherapy, pt has been having weakness, nausea and vomiting, and also has had no bowel movement.  Arrived to ED alert but appears very weak.

## 2016-09-06 NOTE — ED Notes (Signed)
Cath attempt x1 unsuccessful

## 2016-09-06 NOTE — ED Provider Notes (Addendum)
[facility] DEPT Provider Note   CSN: [id number] Arrival date & time: 09/06/16  2037     History   Chief Complaint Chief Complaint  Patient presents with  . Generalized Weakness    HPI  Christina Peterson is a 76 y.o. female with recent diagnosis of non-Hodgkin's lymphoma currently received first dose of chemotherapy on 08/26/2016 who was recently admitted to the hospital and discharged on the 15th after having nausea vomiting and abdominal pain. Family brought patient back today because in the last 10 days she has not had a bowel movement she is not passing gas and she continues to have nausea, vomiting and worsening abdominal pain. Patient denies any fever, chest pain or shortness of breath. She is urinating but less than normal. She denies any dysuria.  Patient states her abdomen is more distended than normal. The pain is sharp and crampy in nature in her abdomen feels tight.    The history is provided by the patient, a relative and a caregiver.    Past Medical History:  Diagnosis Date  . Arthritis   . Hypercholesteremia   . Hypertension    FOLLOWED BY DR [facility]    Patient Active Problem List   Diagnosis Date Noted  . Severe mitral regurgitation: Per 2 D echo 08/23/2016 08/23/2016  . 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 B-cell lymphoma ([place])   . 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 Severe protein-calorie malnutrition ([place])   . Hyponatremia 08/11/2016  . Normocytic normochromic anemia 08/11/2016  . Essential hypertension 08/11/2016  . Constipation 08/11/2016  . Hyperlipidemia 08/11/2016  . Abdominal pain 08/11/2016  . Renal mass, right 08/11/2016  . Adenopathy 08/11/2016  . Other intra-abdominal and pelvic swelling, mass and lump 08/11/2016  . Pain in left hip 06/05/2016    Past Surgical History:  Procedure Laterality Date  . ABDOMINAL HYSTERECTOMY    . IR FLUORO GUIDE PORT INSERTION RIGHT  08/23/2016  . IR US  GUIDE VASC ACCESS RIGHT  08/23/2016  . JOINT REPLACEMENT    . 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 TOTAL HIP REVISION  05/02/2011   Procedure: TOTAL HIP REVISION;  Surgeon: Sharmon Revere, MD;  Location: [facility];  Service: Orthopedics;  Laterality: Left;  left total hip revision with bone grafting    OB History    No data available       Home Medications    Prior to Admission medications   Medication Sig Start Date End Date Taking? Authorizing Provider  allopurinol (ZYLOPRIM) 100 MG tablet Take 1 tablet (100 mg total) by mouth 2 (two) times daily. 08/27/16 09/26/16 Yes Arrien, Jimmy Picket, MD  feeding supplement, ENSURE ENLIVE, (ENSURE ENLIVE) LIQD Take 237 mLs by mouth 2 (two) times daily between meals. Patient taking differently: Take 237 mLs by mouth 2 (two) times daily as needed (nutritional supplement).  08/15/16  Yes Barton Dubois, MD  morphine (MS CONTIN) 15 MG 12 hr tablet Take 1 tablet (15 mg total) by mouth every 12 (twelve) hours. 09/05/16  Yes Brunetta Genera, MD  ondansetron (ZOFRAN) 8 MG tablet Take 1 tablet (8 mg total) by mouth every 8 (eight) hours as needed for nausea or vomiting. 08/28/16  Yes Brunetta Genera, MD  oxyCODONE (OXY IR/ROXICODONE) 5 MG immediate release tablet Take 1-2 tablets (5-10 mg total) by mouth every 4 (four) hours as needed for severe pain or breakthrough pain. 09/02/16  Yes Irene Limbo,  Cloria Spring, MD  pantoprazole (PROTONIX) 20 MG tablet Take 1 tablet (20 mg total) by mouth daily. 07/19/16  Yes Lacretia Leigh, MD  polyethylene glycol [facility] / Floria Raveling) packet Take 17 g by mouth daily. 08/15/16  Yes Barton Dubois, MD  potassium chloride SA (K-DUR,KLOR-CON) 20 MEQ tablet Take 1 tablet (20 mEq total) by mouth daily. 09/05/16  Yes Brunetta Genera, MD  predniSONE (DELTASONE) 20 MG tablet Take 3 tablets (60 mg total) by mouth See admin instructions. 60 mg po daily Day 1 to Day 5 of each chemotherapy cycle 08/28/16  Yes Brunetta Genera, MD    prochlorperazine (COMPAZINE) 10 MG tablet Take 1 tablet (10 mg total) by mouth every 6 (six) hours as needed for nausea or vomiting. 08/28/16  Yes Brunetta Genera, MD  senna-docusate (SENNA S) 8.6-50 MG tablet Take 2 tablets by mouth 2 (two) times daily. Reduce to 2tab po HS if diarrhea occurs 09/02/16  Yes Brunetta Genera, MD  sucralfate (CARAFATE) 1 g tablet Take 1 tablet (1 g total) by mouth 4 (four) times daily. 07/19/16  Yes Lacretia Leigh, MD  docusate sodium (COLACE) 100 MG capsule Take 1 capsule (100 mg total) by mouth every 12 (twelve) hours. Patient not taking: Reported on 09/06/2016 08/15/16   Barton Dubois, MD  feeding supplement (BOOST / RESOURCE BREEZE) LIQD Take 1 Container by mouth 2 (two) times daily between meals. Patient not taking: Reported on 09/06/2016 08/15/16   Barton Dubois, MD  losartan (COZAAR) 50 MG tablet Take 1 tablet (50 mg total) by mouth daily. Patient not taking: Reported on 09/06/2016 08/15/16   Barton Dubois, MD    Family History History reviewed. No pertinent family history.  Social History Social History  Substance Use Topics  . Smoking status: Never Smoker  . Smokeless tobacco: Never Used  . Alcohol use No     Allergies   Patient has no known allergies.   Review of Systems Review of Systems  All other systems reviewed and are negative.    Physical Exam Updated Vital Signs BP 102/67   Pulse 74   Temp 97.9 F (36.6 C) (Oral)   Resp 16   Ht 4\' 11"  (1.499 m)   Wt 49 kg (108 lb)   SpO2 100%   BMI 21.81 kg/m   Physical Exam  Constitutional: She is oriented to person, place, and time. She appears well-developed. She appears cachectic. She has a sickly appearance. No distress.  HENT:  Head: Normocephalic and atraumatic.  Mouth/Throat: Mucous membranes are dry.  Eyes: Conjunctivae and EOM are normal. Pupils are equal, round, and reactive to light.  Neck: Normal range of motion. Neck supple.  Cardiovascular: Normal rate, regular  rhythm and intact distal pulses.   No murmur heard. Pulmonary/Chest: Effort normal and breath sounds normal. No respiratory distress. She has no wheezes. She has no rales.  Abdominal: Soft. She exhibits distension. There is tenderness. There is no rebound and no guarding.  Bowel sounds absent  Genitourinary: Rectum normal.  Genitourinary Comments: No stool in rectal vault  Musculoskeletal: Normal range of motion. She exhibits no edema or tenderness.  Neurological: She is oriented to person, place, and time.  Skin: Skin is warm and dry. Capillary refill takes more than 3 seconds. No rash noted. No erythema.  Poor skin turgor  Psychiatric: She has a normal mood and affect. Her behavior is normal.  Nursing note and vitals reviewed.    ED Treatments / Results  Labs (all labs ordered  are listed, but only abnormal results are displayed) Labs Reviewed  COMPREHENSIVE METABOLIC PANEL - Abnormal; Notable for the following:       Result Value   Sodium 123 (*)    Chloride 74 (*)    Glucose, Bld 121 (*)    BUN 35 (*)    Creatinine, Ser 2.77 (*)    Alkaline Phosphatase 154 (*)    Total Bilirubin 1.8 (*)    GFR calc non Af Amer 15 (*)    GFR calc Af Amer 18 (*)    Anion gap 21 (*)    All other components within normal limits  CBC - Abnormal; Notable for the following:    WBC 14.4 (*)    RBC 3.78 (*)    Hemoglobin 10.5 (*)    HCT 31.8 (*)    All other components within normal limits  DIFFERENTIAL - Abnormal; Notable for the following:    Neutro Abs 13.8 (*)    Lymphs Abs 0.3 (*)    All other components within normal limits  I-STAT CG4 LACTIC ACID, ED - Abnormal; Notable for the following:    Lactic Acid, Venous 5.63 (*)    All other components within normal limits  CULTURE, BLOOD (ROUTINE X 2)  CULTURE, BLOOD (ROUTINE X 2)  URINE CULTURE  LIPASE, BLOOD  CBC WITH DIFFERENTIAL/PLATELET  URINALYSIS, ROUTINE W REFLEX MICROSCOPIC  I-STAT CG4 LACTIC ACID, ED    EKG  EKG  Interpretation None       Radiology Ct Abdomen Pelvis Wo Contrast  Result Date: 09/06/2016 CLINICAL DATA:  Generalize weakness. Abdominal pain. 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 Weakness, nausea, and vomiting since chemotherapy. EXAM: CT ABDOMEN AND PELVIS WITHOUT CONTRAST TECHNIQUE: Multidetector CT imaging of the abdomen and pelvis was performed following the standard protocol without IV contrast. COMPARISON:  08/12/2016 FINDINGS: Lower chest: Diffuse nodular consolidation throughout both lung bases. This is nonspecific in could be due to multifocal pneumonia, aspiration, or lymphoma. Cardiac enlargement. Mildly distended fluid-filled esophagus suggesting reflux or dysmotility. Hepatobiliary: No focal liver lesions identified. Gallbladder is unremarkable. Bile ducts are mildly dilated, likely due to extrinsic compression. Pancreas: Pancreas appears grossly unremarkable. Large lymph node adjacent to the pancreatic head measures 4.2 cm in diameter. This was better seen on the previous study with contrast. The lesion appears unchanged in size. Spleen: Spleen size is normal.  No focal lesions identified. Adrenals/Urinary Tract: Adrenal glands are unremarkable. Kidneys are normal, without renal calculi, focal lesion, or hydronephrosis. Bladder is unremarkable. Stomach/Bowel: The stomach is diffusely distended with fluid. No gastric wall thickening. Duodenum and proximal small bowel are mildly distended and filled with fluid and gas. No definite wall thickening. The distal small bowel is relatively decompressed. The colon is also mildly distended. Prominent stool in the cecum. Small bowel obstruction is not excluded the changes could be due to decreased motility is well. Vascular/Lymphatic: Calcification of the abdominal aorta. No aneurysm. Prominent celiac axis, mesenteric, and retroperitoneal lymphadenopathy is better depicted on previous contrast-enhanced scan but appears to be relatively  unchanged. Reproductive: Status post hysterectomy. No adnexal masses. Other: No free air or free fluid in the abdomen. Abdominal wall musculature appears intact. Musculoskeletal: Postoperative left hip arthroplasty. Degenerative changes throughout the spine. Multiple focal areas of vague lucency demonstrated in the bones probably representing metastatic disease. IMPRESSION: Diffuse nodular consolidation throughout both lung bases could indicate multifocal pneumonia, aspiration, or lymphoma involvement. This is new since previous study. Abdominal and retroperitoneal lymphadenopathy appears similar to prior study.  Comparison is difficult due to lack of IV contrast material. Probable lymphoma involvement of the bones. Fluid distention of the esophagus, stomach, proximal small bowel, and colon. Obstruction is not excluded but changes could be due to decreased motility. Electronically Signed   By: Lucienne Capers M.D.   On: 09/06/2016 23:41    Procedures Procedures (including critical care time)  Medications Ordered in ED Medications - No data to display   Initial Impression / Assessment and Plan / ED Course  I have reviewed the triage vital signs and the nursing notes.  Pertinent labs & imaging results that were available during my care of the patient were reviewed by me and considered in my medical decision making (see chart for details).     Patient is an elderly female with recent diagnosis of non-Hodgkin's lymphoma who started chemotherapy approximately 11 days ago returning today due to worsening abdominal pain, no bowel movement in the last 10 days, nausea and vomiting. Patient appears dehydrated on exam in addition to having a distended firm abdomen which is diffusely tender. On rectal exam patient has no evidence of impaction. There is no stool present in the vault. Vital signs are stable. Patient started on IV fluids and nausea medication. Initial lactic acid is 5.6. Patient denies any  infectious symptoms and low suspicion for sepsis. Concern for possible bowel obstruction or other abdominal pathology at this time. CBC, CMP, lipase, CT pending  11:29 PM Patient with leukocytosis of 14,000, new acute renal failure with a creatinine of 2.7 and hyponatremia of 123. CT pending. Lipase within normal limits. UA pending  11:50 PM CT with concern for possible multifocal pneumonia. Also lymphadenopathy appears similar in the abdomen but fluid distention throughout the abdomen which could be related to decreased motility which also goes along with patient's history. She was covered with healthcare associated pneumonia antibiotics. Blood and urine cultures were sent. Plan on getting a repeat lactate. Patient has had greater than 30/kg of fluid and was started on a rate. Vital signs remained stable. Will admit for further care.  12:20 AM Spoke with Dr. Donne Hazel who will consult on pt in the morning but does not feel she has obstruction tonight which would require an NGT.  CRITICAL CARE Performed by: Blanchie Dessert Total critical care time: 30 minutes Critical care time was exclusive of separately billable procedures and treating other patients. Critical care was necessary to treat or prevent imminent or life-threatening deterioration. Critical care was time spent personally by me on the following activities: development of treatment plan with patient and/or surrogate as well as nursing, discussions with consultants, evaluation of patient's response to treatment, examination of patient, obtaining history from patient or surrogate, ordering and performing treatments and interventions, ordering and review of laboratory studies, ordering and review of radiographic studies, pulse oximetry and re-evaluation of patient's condition.   Final Clinical Impressions(s) / ED Diagnoses   Final diagnoses:  Dehydration  Protein-calorie malnutrition, unspecified severity ([place])  Ileus ([place])  Acute  kidney injury ([place])  Hyponatremia  HCAP (healthcare-associated pneumonia)    New Prescriptions New Prescriptions   No medications on file     Blanchie Dessert, MD 09/06/16 2352    Blanchie Dessert, MD 09/07/16 0021

## 2016-09-06 NOTE — ED Notes (Signed)
Bed: DT91 Expected date:  Expected time:  Means of arrival:  Comments: 76 yo F  Cancer pt  General weakness, N/V

## 2016-09-07 ENCOUNTER — Inpatient Hospital Stay (HOSPITAL_COMMUNITY): Payer: Medicare Other

## 2016-09-07 ENCOUNTER — Other Ambulatory Visit: Payer: Self-pay

## 2016-09-07 DIAGNOSIS — A419 Sepsis, unspecified organism: Secondary | ICD-10-CM | POA: Diagnosis present

## 2016-09-07 DIAGNOSIS — R112 Nausea with vomiting, unspecified: Secondary | ICD-10-CM | POA: Diagnosis present

## 2016-09-07 DIAGNOSIS — R5383 Other fatigue: Secondary | ICD-10-CM | POA: Diagnosis not present

## 2016-09-07 DIAGNOSIS — E46 Unspecified protein-calorie malnutrition: Secondary | ICD-10-CM | POA: Diagnosis not present

## 2016-09-07 DIAGNOSIS — C8338 Diffuse large B-cell lymphoma, lymph nodes of multiple sites: Secondary | ICD-10-CM

## 2016-09-07 DIAGNOSIS — J690 Pneumonitis due to inhalation of food and vomit: Secondary | ICD-10-CM | POA: Diagnosis present

## 2016-09-07 DIAGNOSIS — I340 Nonrheumatic mitral (valve) insufficiency: Secondary | ICD-10-CM | POA: Diagnosis present

## 2016-09-07 DIAGNOSIS — I110 Hypertensive heart disease with heart failure: Secondary | ICD-10-CM | POA: Diagnosis present

## 2016-09-07 DIAGNOSIS — N289 Disorder of kidney and ureter, unspecified: Secondary | ICD-10-CM | POA: Diagnosis not present

## 2016-09-07 DIAGNOSIS — E43 Unspecified severe protein-calorie malnutrition: Secondary | ICD-10-CM | POA: Diagnosis present

## 2016-09-07 DIAGNOSIS — E861 Hypovolemia: Secondary | ICD-10-CM | POA: Diagnosis present

## 2016-09-07 DIAGNOSIS — R109 Unspecified abdominal pain: Secondary | ICD-10-CM | POA: Diagnosis not present

## 2016-09-07 DIAGNOSIS — K5900 Constipation, unspecified: Secondary | ICD-10-CM | POA: Diagnosis not present

## 2016-09-07 DIAGNOSIS — K567 Ileus, unspecified: Secondary | ICD-10-CM | POA: Diagnosis present

## 2016-09-07 DIAGNOSIS — I509 Heart failure, unspecified: Secondary | ICD-10-CM | POA: Diagnosis present

## 2016-09-07 DIAGNOSIS — R627 Adult failure to thrive: Secondary | ICD-10-CM | POA: Diagnosis present

## 2016-09-07 DIAGNOSIS — M1990 Unspecified osteoarthritis, unspecified site: Secondary | ICD-10-CM | POA: Diagnosis present

## 2016-09-07 DIAGNOSIS — R6520 Severe sepsis without septic shock: Secondary | ICD-10-CM

## 2016-09-07 DIAGNOSIS — Y95 Nosocomial condition: Secondary | ICD-10-CM | POA: Diagnosis present

## 2016-09-07 DIAGNOSIS — J189 Pneumonia, unspecified organism: Secondary | ICD-10-CM

## 2016-09-07 DIAGNOSIS — E876 Hypokalemia: Secondary | ICD-10-CM | POA: Diagnosis present

## 2016-09-07 DIAGNOSIS — E860 Dehydration: Secondary | ICD-10-CM | POA: Diagnosis present

## 2016-09-07 DIAGNOSIS — R748 Abnormal levels of other serum enzymes: Secondary | ICD-10-CM | POA: Diagnosis present

## 2016-09-07 DIAGNOSIS — N179 Acute kidney failure, unspecified: Secondary | ICD-10-CM

## 2016-09-07 DIAGNOSIS — E7800 Pure hypercholesterolemia, unspecified: Secondary | ICD-10-CM | POA: Diagnosis present

## 2016-09-07 DIAGNOSIS — G9340 Encephalopathy, unspecified: Secondary | ICD-10-CM | POA: Diagnosis present

## 2016-09-07 DIAGNOSIS — K56609 Unspecified intestinal obstruction, unspecified as to partial versus complete obstruction: Secondary | ICD-10-CM | POA: Diagnosis present

## 2016-09-07 DIAGNOSIS — E785 Hyperlipidemia, unspecified: Secondary | ICD-10-CM | POA: Diagnosis present

## 2016-09-07 DIAGNOSIS — E871 Hypo-osmolality and hyponatremia: Secondary | ICD-10-CM | POA: Diagnosis present

## 2016-09-07 DIAGNOSIS — D649 Anemia, unspecified: Secondary | ICD-10-CM | POA: Diagnosis present

## 2016-09-07 DIAGNOSIS — Z79891 Long term (current) use of opiate analgesic: Secondary | ICD-10-CM | POA: Diagnosis not present

## 2016-09-07 DIAGNOSIS — I4891 Unspecified atrial fibrillation: Secondary | ICD-10-CM | POA: Diagnosis present

## 2016-09-07 LAB — COMPREHENSIVE METABOLIC PANEL
ALT: 27 U/L (ref 14–54)
ANION GAP: 10 (ref 5–15)
AST: 21 U/L (ref 15–41)
Albumin: 2.9 g/dL — ABNORMAL LOW (ref 3.5–5.0)
Alkaline Phosphatase: 100 U/L (ref 38–126)
BUN: 32 mg/dL — ABNORMAL HIGH (ref 6–20)
CO2: 29 mmol/L (ref 22–32)
Calcium: 8.7 mg/dL — ABNORMAL LOW (ref 8.9–10.3)
Chloride: 87 mmol/L — ABNORMAL LOW (ref 101–111)
Creatinine, Ser: 1.74 mg/dL — ABNORMAL HIGH (ref 0.44–1.00)
GFR calc non Af Amer: 27 mL/min — ABNORMAL LOW (ref >60)
GFR, EST AFRICAN AMERICAN: 31 mL/min — AB (ref >60)
Glucose, Bld: 117 mg/dL — ABNORMAL HIGH (ref 65–99)
POTASSIUM: 3.8 mmol/L (ref 3.5–5.1)
SODIUM: 126 mmol/L — AB (ref 135–145)
Total Bilirubin: 1.7 mg/dL — ABNORMAL HIGH (ref 0.3–1.2)
Total Protein: 5.4 g/dL — ABNORMAL LOW (ref 6.5–8.1)

## 2016-09-07 LAB — PHOSPHORUS: Phosphorus: 4.4 mg/dL (ref 2.5–4.6)

## 2016-09-07 LAB — CBC
HCT: 34.9 % — ABNORMAL LOW (ref 36.0–46.0)
Hemoglobin: 11.6 g/dL — ABNORMAL LOW (ref 12.0–15.0)
MCH: 28.4 pg (ref 26.0–34.0)
MCHC: 33.2 g/dL (ref 30.0–36.0)
MCV: 85.3 fL (ref 78.0–100.0)
PLATELETS: 149 10*3/uL — AB (ref 150–400)
RBC: 4.09 MIL/uL (ref 3.87–5.11)
RDW: 15.3 % (ref 11.5–15.5)
WBC: 14.9 10*3/uL — ABNORMAL HIGH (ref 4.0–10.5)

## 2016-09-07 LAB — MRSA PCR SCREENING: MRSA by PCR: NEGATIVE

## 2016-09-07 LAB — MAGNESIUM: MAGNESIUM: 1.4 mg/dL — AB (ref 1.7–2.4)

## 2016-09-07 LAB — LACTIC ACID, PLASMA
LACTIC ACID, VENOUS: 0.9 mmol/L (ref 0.5–1.9)
Lactic Acid, Venous: 1.1 mmol/L (ref 0.5–1.9)

## 2016-09-07 LAB — I-STAT CG4 LACTIC ACID, ED: Lactic Acid, Venous: 1.27 mmol/L (ref 0.5–1.9)

## 2016-09-07 LAB — GLUCOSE, CAPILLARY: GLUCOSE-CAPILLARY: 101 mg/dL — AB (ref 65–99)

## 2016-09-07 MED ORDER — SODIUM CHLORIDE 0.9 % IV BOLUS (SEPSIS)
500.0000 mL | Freq: Once | INTRAVENOUS | Status: AC
  Administered 2016-09-07: 500 mL via INTRAVENOUS

## 2016-09-07 MED ORDER — SODIUM CHLORIDE 0.9 % IV BOLUS (SEPSIS)
1000.0000 mL | Freq: Once | INTRAVENOUS | Status: AC
  Administered 2016-09-07: 500 mL via INTRAVENOUS

## 2016-09-07 MED ORDER — MORPHINE SULFATE (PF) 4 MG/ML IV SOLN
2.0000 mg | INTRAVENOUS | Status: DC | PRN
  Administered 2016-09-07: 2 mg via INTRAVENOUS
  Filled 2016-09-07: qty 1

## 2016-09-07 MED ORDER — MORPHINE SULFATE (PF) 2 MG/ML IV SOLN: 2.0000 mg | INTRAVENOUS | Status: DC | PRN

## 2016-09-07 MED ORDER — SODIUM CHLORIDE 0.9 % IV SOLN
1000.0000 mL | INTRAVENOUS | Status: DC
Start: 2016-09-07 — End: 2016-09-07
  Administered 2016-09-07: 1000 mL via INTRAVENOUS

## 2016-09-07 MED ORDER — PIPERACILLIN-TAZOBACTAM 3.375 G IVPB
3.3750 g | Freq: Three times a day (TID) | INTRAVENOUS | Status: DC
  Administered 2016-09-07 – 2016-09-12 (×15): 3.375 g via INTRAVENOUS
  Filled 2016-09-07 (×16): qty 50

## 2016-09-07 MED ORDER — DEXTROSE-NACL 5-0.9 % IV SOLN
INTRAVENOUS | Status: DC
  Administered 2016-09-07: 16:00:00 via INTRAVENOUS

## 2016-09-07 MED ORDER — DEXTROSE 5 % IV SOLN
1.0000 g | INTRAVENOUS | Status: DC
  Filled 2016-09-07: qty 1

## 2016-09-07 MED ORDER — MAGNESIUM SULFATE 2 GM/50ML IV SOLN
2.0000 g | Freq: Once | INTRAVENOUS | Status: AC
  Administered 2016-09-07: 2 g via INTRAVENOUS
  Filled 2016-09-07: qty 50

## 2016-09-07 MED ORDER — VANCOMYCIN HCL 500 MG IV SOLR
500.0000 mg | INTRAVENOUS | Status: DC
  Administered 2016-09-07 – 2016-09-08 (×2): 500 mg via INTRAVENOUS
  Filled 2016-09-07 (×3): qty 500

## 2016-09-07 MED ORDER — MORPHINE SULFATE (PF) 4 MG/ML IV SOLN: 1.0000 mg | INTRAVENOUS | Status: DC | PRN

## 2016-09-07 MED ORDER — VANCOMYCIN HCL IN DEXTROSE 750-5 MG/150ML-% IV SOLN: 750.0000 mg | INTRAVENOUS | Status: DC

## 2016-09-07 MED ORDER — ONDANSETRON HCL 4 MG/2ML IJ SOLN
4.0000 mg | Freq: Four times a day (QID) | INTRAMUSCULAR | Status: DC | PRN
  Administered 2016-09-07 – 2016-09-10 (×4): 4 mg via INTRAVENOUS
  Filled 2016-09-07 (×4): qty 2

## 2016-09-07 MED ORDER — ACETAMINOPHEN 10 MG/ML IV SOLN
1000.0000 mg | Freq: Four times a day (QID) | INTRAVENOUS | Status: AC | PRN
  Filled 2016-09-07: qty 100

## 2016-09-07 MED ORDER — ORAL CARE MOUTH RINSE
15.0000 mL | Freq: Two times a day (BID) | OROMUCOSAL | Status: DC | PRN
  Administered 2016-09-09: 15 mL via OROMUCOSAL
  Filled 2016-09-07: qty 15

## 2016-09-07 MED ORDER — HEPARIN SODIUM (PORCINE) 5000 UNIT/ML IJ SOLN
5000.0000 [IU] | Freq: Three times a day (TID) | INTRAMUSCULAR | Status: DC
  Administered 2016-09-07 – 2016-09-12 (×16): 5000 [IU] via SUBCUTANEOUS
  Filled 2016-09-07 (×15): qty 1

## 2016-09-07 MED ORDER — MORPHINE SULFATE (PF) 2 MG/ML IV SOLN
1.0000 mg | INTRAVENOUS | Status: DC | PRN
  Administered 2016-09-08 – 2016-09-12 (×7): 1 mg via INTRAVENOUS
  Filled 2016-09-07 (×7): qty 1

## 2016-09-07 MED ORDER — METOPROLOL TARTRATE 5 MG/5ML IV SOLN: 2.5000 mg | Freq: Four times a day (QID) | INTRAVENOUS | Status: DC | PRN

## 2016-09-07 NOTE — Progress Notes (Signed)
MD notified of change in heart rhythm. EKG performed vitals taken.

## 2016-09-07 NOTE — Progress Notes (Signed)
Pharmacy Antibiotic Note  Christina Peterson is a 76 y.o. female admitted on 09/06/2016 with pneumonia.  Pharmacy has been consulted for Vancomycin, cefepime dosing.  Plan:  Adjust vancomycin to 500mg  IV q24h - next dose 5/27 at 10:00  Check trough at steady state  Recommend checking MRSA PCR nasal swab as a negative value can assist in ruling out MRSA pneumonia  Continue Cefepime 1g IV q24hr  Height: 4\' 11"  (149.9 cm) Weight: 116 lb 13.5 oz (53 kg) IBW/kg (Calculated) : 43.2  Temp (24hrs), Avg:97.9 F (36.6 C), Min:97.7 F (36.5 C), Max:98.3 F (36.8 C)   Recent Labs Lab 09/02/16 1319 09/02/16 1319 09/06/16 2140 09/06/16 2156 09/06/16 2255 09/07/16 0008 09/07/16 0641  WBC 6.8  --   --   --  14.4*  --  14.9*  CREATININE  --  0.7 2.77*  --   --   --  1.74*  LATICACIDVEN  --   --   --  5.63*  --  1.27  --     Estimated Creatinine Clearance: 20.1 mL/min (A) (by C-G formula based on SCr of 1.74 mg/dL (H)).    No Known Allergies  Antimicrobials this admission:  5/26 Vanc >> 5/26 Cefepime >>  Dose adjustments this admission:  ---  Microbiology results:  5/25 BCx: sent 5/25 UCx: sent  Thank you for allowing pharmacy to be a part of this patient's care.  Peggyann Juba, PharmD, BCPS Pager: [phone] 09/07/2016 9:13 AM

## 2016-09-07 NOTE — Progress Notes (Signed)
Pharmacy Antibiotic Note  BRISA AUTH is a 76 y.o. female admitted on 09/06/2016 with pneumonia.  Pharmacy has been consulted for Vancomycin, cefepime dosing. Cefepime now changed to Zosyn  Plan:  Continue vancomycin 500mg  IV q24h - next dose 5/27 at 10:00  Check trough at steady state  Recommend checking MRSA PCR nasal swab as a negative value can assist in ruling out MRSA pneumonia  Zosyn 3.375g IV q8 (extended interval infusion) for CrCl > 20 ml/min but monitor SCr closely  Height: 4\' 11"  (149.9 cm) Weight: 116 lb 13.5 oz (53 kg) IBW/kg (Calculated) : 43.2  Temp (24hrs), Avg:98 F (36.7 C), Min:97.7 F (36.5 C), Max:98.3 F (36.8 C)   Recent Labs Lab 09/02/16 1319 09/02/16 1319 09/06/16 2140 09/06/16 2156 09/06/16 2255 09/07/16 0008 09/07/16 0641  WBC 6.8  --   --   --  14.4*  --  14.9*  CREATININE  --  0.7 2.77*  --   --   --  1.74*  LATICACIDVEN  --   --   --  5.63*  --  1.27  --     Estimated Creatinine Clearance: 20.1 mL/min (A) (by C-G formula based on SCr of 1.74 mg/dL (H)).    No Known Allergies  Antimicrobials this admission:  5/26 Vanc >> 5/26 Cefepime >> 5/26 5/26 Zosyn >>  Dose adjustments this admission:  ---  Microbiology results:  5/25 BCx: sent 5/25 UCx: sent   Thank you for allowing pharmacy to be a part of this patient's care.   Adrian Saran, PharmD, BCPS Pager [phone] 09/07/2016 2:19 PM

## 2016-09-07 NOTE — Progress Notes (Addendum)
PROGRESS NOTE    [name]  OZD:[id number] DOB: 10/22/1940 DOA: 09/06/2016 PCP: Christina Bowels, MD (Inactive)   Brief Narrative: Christina Peterson is a 76 y.o. female with medical history significant of HTN, HLD, severe mitral regurgitation, newly diagnosed non-Hodgkin's lymphoma; who presents with complaints of generalized weakness. Patient was last admitted to the hospital from 5/9 - 5/15 for hypovolemic hyponatremia, abdominal pain, and generalized weakness. Patient was discharged home, but since that time has not been doing well. Therefore that the patient has not had a bowel movement in 10 days. Family notes the patient was taking MiraLAX and trying other remedies such as prune juice without relief of symptoms. For the last 4 days she has had persistent nausea and vomiting unable to keep any food, liquid, or medications down. Furthermore, they note patient complaining of having worsening abdominal pain that she describes as crampy and distention. She had received 1 chemotherapy treatment on 5/14 prior to being discharged. Family denies any significant fevers, chest pain, hematemesis, dysuria, or diarrhea. The patient's daughter wonders if the patient can receive any more assistance at home.  ED Course: Upo n admission into the emergency department the patient was found to have WBC 14.4, sodium 123, chloride 74, BUN 35, creatinine 2.77, and  lactic acid of 5.63. CT imaging shows signs of possible multifocal pneumonia and fluid distention within the esophagus, stomach, and small bowel. Severe sepsis protocol was initiated and patient was placed on broad spectrum antibiotics of Vancomycin and cefepime given recent hospitalization. General surgery was consulted given concern for possible ileus.   Assessment & Plan:   Principal Problem:   Sepsis ([place]) Active Problems:   Hyponatremia   Normocytic normochromic anemia   Constipation   Severe protein-calorie malnutrition (HCC)   Diffuse  large B-cell lymphoma of lymph nodes of multiple regions (HCC)   Nausea and vomiting   Ileus (HCC)   1-Sepsis;  Secondary to PNA vs intra-abdominal process.  Continue with IV antibiotics.  CT ; diffuse nodular opacities B/L lungs.  Follow WBC.  Lactic acid trending down.  Follow blood culture.  Will change cefepime to zosyn to cover for intra-abdominal infection and aspiration PNA>   Addendum 6 pm;  Patient is more lethargic, hypotensive BP in the 80.  Will cycle lactic acid.  IV bolus 1 L.  CCM consulted.  ABG ordered.   2-Hyponatremia; related to hypovolemia.  Improving with IV fluids.   3-Ileus vs SBO.; Surgery following.  IV fluids.  NG tube to be place.  Repeat X ray in am.  Discussed case with sx.   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 -Patient last received received chemotherapy, (R mini CHOP) plus tumor lysis syndrome prophylaxis with allopurinol on 5/14. -Follows with Dr Christina Peterson.   5-AKI; Secondary to hypovolemia, infection.  Improving with IV fluids.  Cr peak to 2.7--1.7.  Severe protein calorie malnutrition Resume diet when able to tolerate.   Hypomagnesemia; replete IV.   Anemia; follow trend.   A fib RVR;  IV bolus, PRN metoprolol.  Transfer to step down unit.   DVT prophylaxis: Heparin  Code Status: Full Code.  Family Communication: Multiples family member at bedside.  Disposition Plan: to be determine   Consultants:   Surgery    Procedures:  NG tube placement    Antimicrobials:   Vancomycin 5-25  Cefepime 5-25   Subjective: Report abdominal pain. No BM for more than 1 week. No passing gas.  She  is sleepy, wake up and answer some questions.   Objective: Vitals:   09/07/16 0045 09/07/16 0100 09/07/16 0149 09/07/16 0544  BP:  120/77 91/61 106/66  Pulse: 87 99 89 88  Resp: 20  20 20   Temp:   97.8 F (36.6 C) 98.3 F (36.8 C)  TempSrc:    Oral Axillary  SpO2: 94% 95% 97% 97%  Weight:   53 kg (116 lb 13.5 oz)   Height:   4\' 11"  (1.499 m)     Intake/Output Summary (Last 24 hours) at 09/07/16 1246 Last data filed at 09/07/16 1100  Gross per 24 hour  Intake          2858.33 ml  Output             1250 ml  Net          1608.33 ml   Filed Weights   09/06/16 2125 09/07/16 0149  Weight: 49 kg (108 lb) 53 kg (116 lb 13.5 oz)    Examination:  General exam: lethargic, open eyes answer some questions.  Respiratory system: Clear to auscultation. Respiratory effort normal. Cardiovascular system: S1 & S2 heard, RRR. No JVD, murmurs, rubs, gallops or clicks. No pedal edema. Gastrointestinal system: Abdomen is distended, NT, tenderness generalized.  Central nervous system: Alert and oriented. No focal neurological deficits. Extremities: Symmetric 5 x 5 power. Skin: No rash     Data Reviewed: I have personally reviewed following labs and imaging studies  CBC:  Recent Labs Lab 09/02/16 1319 09/06/16 2255 09/07/16 0641  WBC 6.8 14.4* 14.9*  NEUTROABS 6.1 13.8*  --   HGB 11.9 10.5* 11.6*  HCT 36.8 31.8* 34.9*  MCV 87.6 84.1 85.3  PLT 100* 150 932*   Basic Metabolic Panel:  Recent Labs Lab 09/02/16 1319 09/02/16 1319 09/06/16 2140 09/07/16 0641  NA 131*  --  123* 126*  K 3.3*  --  3.8 3.8  CL  --   --  74* 87*  CO2 34*  --  28 29  GLUCOSE 117  --  121* 117*  BUN 12.3  --  35* 32*  CREATININE 0.7  --  2.77* 1.74*  CALCIUM 9.9  --  9.9 8.7*  MG 1.6  --   --  1.4*  PHOS  --  2.4*  --  4.4   GFR: Estimated Creatinine Clearance: 20.1 mL/min (A) (by C-G formula based on SCr of 1.74 mg/dL (H)). Liver Function Tests:  Recent Labs Lab 09/02/16 1319 09/06/16 2140 09/07/16 0641  AST 29 26 21   ALT 78* 39 27  ALKPHOS 211* 154* 100  BILITOT 2.88* 1.8* 1.7*  PROT 6.5 6.9 5.4*  ALBUMIN 3.7 3.8 2.9*    Recent Labs Lab 09/06/16 2140  LIPASE 19   No results for input(s): AMMONIA in the last 168  hours. Coagulation Profile: No results for input(s): INR, PROTIME in the last 168 hours. Cardiac Enzymes: No results for input(s): CKTOTAL, CKMB, CKMBINDEX, TROPONINI in the last 168 hours. BNP (last 3 results) No results for input(s): PROBNP in the last 8760 hours. HbA1C: No results for input(s): HGBA1C in the last 72 hours. CBG:  Recent Labs Lab 09/07/16 1225  GLUCAP 101*   Lipid Profile: No results for input(s): CHOL, HDL, LDLCALC, TRIG, CHOLHDL, LDLDIRECT in the last 72 hours. Thyroid Function Tests: No results for input(s): TSH, T4TOTAL, FREET4, T3FREE, THYROIDAB in the last 72 hours. Anemia Panel: No results for input(s): VITAMINB12, FOLATE, FERRITIN, TIBC, IRON, RETICCTPCT in the last  72 hours. Sepsis Labs:  Recent Labs Lab 09/06/16 2156 09/07/16 0008  LATICACIDVEN 5.63* 1.27    No results found for this or any previous visit (from the past 240 hour(s)).       Radiology Studies: Ct Abdomen Pelvis Wo Contrast  Result Date: 09/06/2016 CLINICAL DATA:  Generalize weakness. Abdominal pain. 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 Weakness, nausea, and vomiting since chemotherapy. EXAM: CT ABDOMEN AND PELVIS WITHOUT CONTRAST TECHNIQUE: Multidetector CT imaging of the abdomen and pelvis was performed following the standard protocol without IV contrast. COMPARISON:  08/12/2016 FINDINGS: Lower chest: Diffuse nodular consolidation throughout both lung bases. This is nonspecific in could be due to multifocal pneumonia, aspiration, or lymphoma. Cardiac enlargement. Mildly distended fluid-filled esophagus suggesting reflux or dysmotility. Hepatobiliary: No focal liver lesions identified. Gallbladder is unremarkable. Bile ducts are mildly dilated, likely due to extrinsic compression. Pancreas: Pancreas appears grossly unremarkable. Large lymph node adjacent to the pancreatic head measures 4.2 cm in diameter. This was better seen on the previous study with contrast. The lesion  appears unchanged in size. Spleen: Spleen size is normal.  No focal lesions identified. Adrenals/Urinary Tract: Adrenal glands are unremarkable. Kidneys are normal, without renal calculi, focal lesion, or hydronephrosis. Bladder is unremarkable. Stomach/Bowel: The stomach is diffusely distended with fluid. No gastric wall thickening. Duodenum and proximal small bowel are mildly distended and filled with fluid and gas. No definite wall thickening. The distal small bowel is relatively decompressed. The colon is also mildly distended. Prominent stool in the cecum. Small bowel obstruction is not excluded the changes could be due to decreased motility is well. Vascular/Lymphatic: Calcification of the abdominal aorta. No aneurysm. Prominent celiac axis, mesenteric, and retroperitoneal lymphadenopathy is better depicted on previous contrast-enhanced scan but appears to be relatively unchanged. Reproductive: Status post hysterectomy. No adnexal masses. Other: No free air or free fluid in the abdomen. Abdominal wall musculature appears intact. Musculoskeletal: Postoperative left hip arthroplasty. Degenerative changes throughout the spine. Multiple focal areas of vague lucency demonstrated in the bones probably representing metastatic disease. IMPRESSION: Diffuse nodular consolidation throughout both lung bases could indicate multifocal pneumonia, aspiration, or lymphoma involvement. This is new since previous study. Abdominal and retroperitoneal lymphadenopathy appears similar to prior study. Comparison is difficult due to lack of IV contrast material. Probable lymphoma involvement of the bones. Fluid distention of the esophagus, stomach, proximal small bowel, and colon. Obstruction is not excluded but changes could be due to decreased motility. Electronically Signed   By: Lucienne Capers M.D.   On: 09/06/2016 23:41   Dg Abd 1 View  Result Date: 09/07/2016 CLINICAL DATA:  Weakness.  Nausea and vomiting. EXAM: ABDOMEN -  1 VIEW COMPARISON:  CT scan Sep 06, 2016 FINDINGS: Increased density in the upper abdomen is consistent with the fluid-filled distended stomach seen on yesterday's CT scan. Air-filled loops of large and small bowel may represent ileus. No other acute abnormalities. IMPRESSION: 1. Increased density in the upper abdomen consistent with the distended fluid-filled stomach seen on yesterday's CT scan. 2. Air-filled mildly prominent loops of large and small bowel may represent ileus. Electronically Signed   By: Dorise Bullion III M.D   On: 09/07/2016 09:14   Dg Chest Port 1 View  Result Date: 09/07/2016 CLINICAL DATA:  Acute onset of generalized weakness. Stage 3 lymphoma. Nausea and vomiting. Initial encounter. EXAM: PORTABLE CHEST 1 VIEW COMPARISON:  Chest radiograph performed 10/01/2015, and CT of the chest performed 08/12/2016 FINDINGS: Bibasilar airspace opacities raise concern for pneumonia. No  definite pleural effusion or pneumothorax is seen. The cardiomediastinal silhouette is mildly enlarged. No acute osseous abnormalities are identified. A right-sided chest port is noted ending about the distal SVC. IMPRESSION: 1. Bibasilar airspace opacities raise concern for pneumonia. 2. Mild cardiomegaly. Electronically Signed   By: Garald Balding M.D.   On: 09/07/2016 00:12        Scheduled Meds: . heparin  5,000 Units Subcutaneous Q8H   Continuous Infusions: . sodium chloride 1,000 mL (09/07/16 1211)  . acetaminophen    . ceFEPime (MAXIPIME) IV    . vancomycin Stopped (09/07/16 1119)     LOS: 0 days    Time spent: 35 minutes.     Elmarie Shiley, MD [facility] Pager [phone]  If 7PM-7AM, please contact night-coverage [email or web address] Password [facility] 09/07/2016, 12:46 PM

## 2016-09-07 NOTE — ED Notes (Signed)
Dr. Fuller Plan, hospitalist, at bedside.

## 2016-09-07 NOTE — Progress Notes (Signed)
eLink Physician-Brief Progress Note Patient Name: Christina Peterson DOB: Sep 10, 1940 MRN: [id number]   Date of Service  09/07/2016  HPI/Events of Note  76 y.o.femalewith medical history of HTN, HLD, severe mitral regurgitation, newly diagnosed non-Hodgkin's lymphoma admitted with abd pain.  Patient with ileus vs obstruction on abd imaging.  Surgery following.  This afternoon she developed hypotension which responded to volume resuscitation.  Lactic acid is 1.1.   Renal fxn improved.  eICU Interventions  Presently stable from hemodynamic standpoint and on empiric abx.   Agree with management Please call if further questions     Intervention Category Evaluation Type: New Patient Evaluation  ANAB, VIVAR, P 09/07/2016, 8:41 PM

## 2016-09-07 NOTE — H&P (Signed)
History and Physical    Christina Peterson TKW:[id number] DOB: 10/11/1940 DOA: 09/06/2016  Referring MD/NP/PA: Dr. Maryan Rued PCP: Julius Bowels, MD (Inactive)  Patient coming from: Home  Chief Complaint: Generalized weakness  HPI: Christina Peterson is a 76 y.o. female with medical history significant of HTN, HLD, severe mitral regurgitation, newly diagnosed non-Hodgkin's lymphoma; who presents with complaints of generalized weakness. Patient was last admitted to the hospital from 5/9 - 5/15 for hypovolemic hyponatremia, abdominal pain, and generalized weakness. Patient was discharged home, but since that time has not been doing well. Therefore that the patient has not had a bowel movement in 10 days. Family notes the patient was taking MiraLAX and trying other remedies such as prune juice without relief of symptoms. For the last 4 days she has had persistent nausea and vomiting unable to keep any food, liquid, or medications down. Furthermore, they note patient complaining of having worsening abdominal pain that she describes as crampy and distention. She had received 1 chemotherapy treatment on 5/14 prior to being discharged. Family denies any significant fevers, chest pain, hematemesis, dysuria, or diarrhea. The patient's daughter wonders if the patient can receive any more assistance at home.  ED Course: Upo n admission into the emergency department the patient was found to have WBC 14.4, sodium 123, chloride 74, BUN 35, creatinine 2.77, and  lactic acid of 5.63. CT imaging shows signs of possible multifocal pneumonia and fluid distention within the esophagus, stomach, and small bowel. Severe sepsis protocol was initiated and patient was placed on broad spectrum antibiotics of Vancomycin and cefepime given recent hospitalization. General surgery was consulted given concern for possible ileus.  Review of Systems: As per HPI otherwise 10 point review of systems negative.   Past Medical History:    Diagnosis Date  . Arthritis   . Hypercholesteremia   . Hypertension    FOLLOWED BY DR Katherine Roan    Past Surgical History:  Procedure Laterality Date  . ABDOMINAL HYSTERECTOMY    . IR FLUORO GUIDE PORT INSERTION RIGHT  08/23/2016  . IR US GUIDE VASC ACCESS RIGHT  08/23/2016  . JOINT REPLACEMENT    . 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 TOTAL HIP REVISION  05/02/2011   Procedure: TOTAL HIP REVISION;  Surgeon: Sharmon Revere, MD;  Location: [place];  Service: Orthopedics;  Laterality: Left;  left total hip revision with bone grafting     reports that she has never smoked. She has never used smokeless tobacco. She reports that she does not drink alcohol or use drugs.  No Known Allergies  History reviewed. No pertinent family history.  Prior to Admission medications   Medication Sig Start Date End Date Taking? Authorizing Provider  allopurinol (ZYLOPRIM) 100 MG tablet Take 1 tablet (100 mg total) by mouth 2 (two) times daily. 08/27/16 09/26/16 Yes Arrien, Jimmy Picket, MD  feeding supplement, ENSURE ENLIVE, (ENSURE ENLIVE) LIQD Take 237 mLs by mouth 2 (two) times daily between meals. Patient taking differently: Take 237 mLs by mouth 2 (two) times daily as needed (nutritional supplement).  08/15/16  Yes Barton Dubois, MD  morphine (MS CONTIN) 15 MG 12 hr tablet Take 1 tablet (15 mg total) by mouth every 12 (twelve) hours. 09/05/16  Yes Brunetta Genera, MD  ondansetron (ZOFRAN) 8 MG tablet Take 1 tablet (8 mg total) by mouth every 8 (eight) hours as needed for nausea or vomiting. 08/28/16  Yes Brunetta Genera, MD  oxyCODONE (OXY IR/ROXICODONE) 5  MG immediate release tablet Take 1-2 tablets (5-10 mg total) by mouth every 4 (four) hours as needed for severe pain or breakthrough pain. 09/02/16  Yes Brunetta Genera, MD  pantoprazole (PROTONIX) 20 MG tablet Take 1 tablet (20 mg total) by mouth daily. 07/19/16  Yes Lacretia Leigh, MD  polyethylene glycol [facility] Floria Raveling) packet  Take 17 g by mouth daily. 08/15/16  Yes Barton Dubois, MD  potassium chloride SA (K-DUR,KLOR-CON) 20 MEQ tablet Take 1 tablet (20 mEq total) by mouth daily. 09/05/16  Yes Brunetta Genera, MD  predniSONE (DELTASONE) 20 MG tablet Take 3 tablets (60 mg total) by mouth See admin instructions. 60 mg po daily Day 1 to Day 5 of each chemotherapy cycle 08/28/16  Yes Brunetta Genera, MD  prochlorperazine (COMPAZINE) 10 MG tablet Take 1 tablet (10 mg total) by mouth every 6 (six) hours as needed for nausea or vomiting. 08/28/16  Yes Brunetta Genera, MD  senna-docusate (SENNA S) 8.6-50 MG tablet Take 2 tablets by mouth 2 (two) times daily. Reduce to 2tab po HS if diarrhea occurs 09/02/16  Yes Brunetta Genera, MD  sucralfate (CARAFATE) 1 g tablet Take 1 tablet (1 g total) by mouth 4 (four) times daily. 07/19/16  Yes Lacretia Leigh, MD  docusate sodium (COLACE) 100 MG capsule Take 1 capsule (100 mg total) by mouth every 12 (twelve) hours. Patient not taking: Reported on 09/06/2016 08/15/16   Barton Dubois, MD  feeding supplement (BOOST / RESOURCE BREEZE) LIQD Take 1 Container by mouth 2 (two) times daily between meals. Patient not taking: Reported on 09/06/2016 08/15/16   Barton Dubois, MD  losartan (COZAAR) 50 MG tablet Take 1 tablet (50 mg total) by mouth daily. Patient not taking: Reported on 09/06/2016 08/15/16   Barton Dubois, MD    Physical Exam:    Constitutional:Thin elderly female who appears to be sick in moderate distress.  Vitals:   09/06/16 2200 09/06/16 2230 09/06/16 2301 09/06/16 2336  BP: (!) 110/58 115/64 116/72 123/89  Pulse:    (!) 101  Resp: 16 18 (!) 23 (!) 26  Temp:      TempSrc:      SpO2:    97%  Weight:      Height:       Eyes: PERRL, lids and conjunctivae normal ENMT: Mucous membranes are dry. Posterior pharynx clear of any exudate or lesions.Normal dentition.  Neck: normal, supple, no masses, no thyromegaly Respiratory: clear to auscultation bilaterally, no  wheezing, no crackles. Normal respiratory effort. No accessory muscle use.  Cardiovascular: Regular rate and rhythm, no murmurs / rubs / gallops. No extremity edema. 2+ pedal pulses. No carotid bruits.  Abdomen: Generalized tenderness to palpation of the abdomen, no masses palpated. No hepatosplenomegaly. Bowel soundshypoactive.  Musculoskeletal: no clubbing / cyanosis. No joint deformity upper and lower extremities. Good ROM, no contractures. Normal muscle tone.  Skin: no rashes, lesions, ulcers. No induration. Poor skin turgor. Neurologic: CN 2-12 grossly intact. Sensation intact, DTR normal. Strength 4/5 in all 4.  Psychiatric: Normal judgment and insight. Alert and oriented x 3. Normal mood.     Labs on Admission: I have personally reviewed following labs and imaging studies  CBC:  Recent Labs Lab 09/02/16 1319 09/06/16 2255  WBC 6.8 14.4*  NEUTROABS 6.1 13.8*  HGB 11.9 10.5*  HCT 36.8 31.8*  MCV 87.6 84.1  PLT 100* 161   Basic Metabolic Panel:  Recent Labs Lab 09/02/16 1319 09/02/16 1319 09/06/16 2140  NA  131*  --  123*  K 3.3*  --  3.8  CL  --   --  74*  CO2 34*  --  28  GLUCOSE 117  --  121*  BUN 12.3  --  35*  CREATININE 0.7  --  2.77*  CALCIUM 9.9  --  9.9  MG 1.6  --   --   PHOS  --  2.4*  --    GFR: Estimated Creatinine Clearance: 11.6 mL/min (A) (by C-G formula based on SCr of 2.77 mg/dL (H)). Liver Function Tests:  Recent Labs Lab 09/02/16 1319 09/06/16 2140  AST 29 26  ALT 78* 39  ALKPHOS 211* 154*  BILITOT 2.88* 1.8*  PROT 6.5 6.9  ALBUMIN 3.7 3.8    Recent Labs Lab 09/06/16 2140  LIPASE 19   No results for input(s): AMMONIA in the last 168 hours. Coagulation Profile: No results for input(s): INR, PROTIME in the last 168 hours. Cardiac Enzymes: No results for input(s): CKTOTAL, CKMB, CKMBINDEX, TROPONINI in the last 168 hours. BNP (last 3 results) No results for input(s): PROBNP in the last 8760 hours. HbA1C: No results for  input(s): HGBA1C in the last 72 hours. CBG: No results for input(s): GLUCAP in the last 168 hours. Lipid Profile: No results for input(s): CHOL, HDL, LDLCALC, TRIG, CHOLHDL, LDLDIRECT in the last 72 hours. Thyroid Function Tests: No results for input(s): TSH, T4TOTAL, FREET4, T3FREE, THYROIDAB in the last 72 hours. Anemia Panel: No results for input(s): VITAMINB12, FOLATE, FERRITIN, TIBC, IRON, RETICCTPCT in the last 72 hours. Urine analysis:    Component Value Date/Time   COLORURINE YELLOW 08/21/2016 1700   APPEARANCEUR CLEAR 08/21/2016 1700   LABSPEC 1.012 08/21/2016 1700   PHURINE 6.0 08/21/2016 1700   GLUCOSEU NEGATIVE 08/21/2016 1700   HGBUR MODERATE (A) 08/21/2016 1700   BILIRUBINUR NEGATIVE 08/21/2016 1700   KETONESUR 5 (A) 08/21/2016 1700   PROTEINUR NEGATIVE 08/21/2016 1700   UROBILINOGEN 0.2 04/30/2011 1145   NITRITE NEGATIVE 08/21/2016 1700   LEUKOCYTESUR NEGATIVE 08/21/2016 1700   Sepsis Labs: No results found for this or any previous visit (from the past 240 hour(s)).   Radiological Exams on Admission: Ct Abdomen Pelvis Wo Contrast  Result Date: 09/06/2016 CLINICAL DATA:  Generalize weakness. Abdominal pain. 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 Weakness, nausea, and vomiting since chemotherapy. EXAM: CT ABDOMEN AND PELVIS WITHOUT CONTRAST TECHNIQUE: Multidetector CT imaging of the abdomen and pelvis was performed following the standard protocol without IV contrast. COMPARISON:  08/12/2016 FINDINGS: Lower chest: Diffuse nodular consolidation throughout both lung bases. This is nonspecific in could be due to multifocal pneumonia, aspiration, or lymphoma. Cardiac enlargement. Mildly distended fluid-filled esophagus suggesting reflux or dysmotility. Hepatobiliary: No focal liver lesions identified. Gallbladder is unremarkable. Bile ducts are mildly dilated, likely due to extrinsic compression. Pancreas: Pancreas appears grossly unremarkable. Large lymph node adjacent  to the pancreatic head measures 4.2 cm in diameter. This was better seen on the previous study with contrast. The lesion appears unchanged in size. Spleen: Spleen size is normal.  No focal lesions identified. Adrenals/Urinary Tract: Adrenal glands are unremarkable. Kidneys are normal, without renal calculi, focal lesion, or hydronephrosis. Bladder is unremarkable. Stomach/Bowel: The stomach is diffusely distended with fluid. No gastric wall thickening. Duodenum and proximal small bowel are mildly distended and filled with fluid and gas. No definite wall thickening. The distal small bowel is relatively decompressed. The colon is also mildly distended. Prominent stool in the cecum. Small bowel obstruction is not excluded the  changes could be due to decreased motility is well. Vascular/Lymphatic: Calcification of the abdominal aorta. No aneurysm. Prominent celiac axis, mesenteric, and retroperitoneal lymphadenopathy is better depicted on previous contrast-enhanced scan but appears to be relatively unchanged. Reproductive: Status post hysterectomy. No adnexal masses. Other: No free air or free fluid in the abdomen. Abdominal wall musculature appears intact. Musculoskeletal: Postoperative left hip arthroplasty. Degenerative changes throughout the spine. Multiple focal areas of vague lucency demonstrated in the bones probably representing metastatic disease. IMPRESSION: Diffuse nodular consolidation throughout both lung bases could indicate multifocal pneumonia, aspiration, or lymphoma involvement. This is new since previous study. Abdominal and retroperitoneal lymphadenopathy appears similar to prior study. Comparison is difficult due to lack of IV contrast material. Probable lymphoma involvement of the bones. Fluid distention of the esophagus, stomach, proximal small bowel, and colon. Obstruction is not excluded but changes could be due to decreased motility. Electronically Signed   By: Lucienne Capers M.D.   On:  09/06/2016 23:41   Dg Chest Port 1 View  Result Date: 09/07/2016 CLINICAL DATA:  Acute onset of generalized weakness. Stage 3 lymphoma. Nausea and vomiting. Initial encounter. EXAM: PORTABLE CHEST 1 VIEW COMPARISON:  Chest radiograph performed 10/01/2015, and CT of the chest performed 08/12/2016 FINDINGS: Bibasilar airspace opacities raise concern for pneumonia. No definite pleural effusion or pneumothorax is seen. The cardiomediastinal silhouette is mildly enlarged. No acute osseous abnormalities are identified. A right-sided chest port is noted ending about the distal SVC. IMPRESSION: 1. Bibasilar airspace opacities raise concern for pneumonia. 2. Mild cardiomegaly. Electronically Signed   By: Garald Balding M.D.   On: 09/07/2016 00:12    EKG: Independently reviewed. Sinus rhythm with PVCs  Assessment/Plan Sepsis 2/2 suspect pneumonia: Patient presents with tachycardia , tachypnea, and blood pressures as low as 81/57. Labs revealed WBC 14.4 and lactic acid 5.63 admission. Imaging studies revealed signs of pneumonia as well as bowel obstruction She received 3 L while in the emergency department as well as empiric antibiotics of vancomycin and Zosyn. Question healthcare associated pneumonia versus aspiration.   - Admit to a telemetry bed - Follow up UA, blood and sputum cultures - Added on procalcitionin - Continue empiric antibiotics Vancomycin and cefepime per pharmacy - Trend lactic acid levels  Nausea and vomiting with suspected Ileus/constipation: Patient reports no bowel movement since previous hospitalization. CT imaging shows fluid distention of the esophagus and stomach, but bowel obstruction cannot be excluded. General surgery did not recommend NG tube at this time. - Gen. surgery to see in a.m. - Antiemetics prn N/V  Hypovolemic hyponatremia: Acute. Patient presents in with sodium of 123 on admission.  Acute renal failure 2/2 Dehydration: Creatinine previously had been 0.7 just 4  days ago.[name] Kitchen However, she presents with a creatinine of 2.77 and a BUN of 35. Suspect symptoms likely secondary to dehydration and poor by mouth intake. - IVF NS at 160ml/hr as tolerated - Repeat BMP in a.m.    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 Patient last received received chemotherapy, (R mini CHOP) plus tumor lysis syndrome prophylaxis with allopurinol on 5/14 - Notify oncology at the patient's ear   Elevated liver enzymes: Patient noted to have a downward trending alkaline phosphatase and total bilirubin. - Continue monitor  Normocytic normochromic anemia: Hemoglobin 10.5 on admission, previously noted to be around this level earlier this month.  - Will closely monitor given the possibility of hemoconcentration.  Severe protein calorie malnutrition - Check magnesium and phosphorus levels  DVT prophylaxis: heparin Code Status: Full  Family Communication: Discuss plan of care with patient's family present at bedside  Disposition Plan: TBD  Consults called: Surgery  Admission status: Inpatient  Norval Morton MD [facility] Pager [phone]  If 7PM-7AM, please contact night-coverage [email or web address] Password [facility]  09/07/2016, 12:33 AM

## 2016-09-07 NOTE — Progress Notes (Signed)
Pharmacy Antibiotic Note  Christina Peterson is a 76 y.o. female admitted on 09/06/2016 with pneumonia.  Pharmacy has been consulted for Vancomycin, cefepime dosing.  Plan: Vancomycin 750mg  IV every 48 hours.  Goal trough 15-20 mcg/mL.  Cefepime 1gm iv q24hr  Height: 4\' 11"  (149.9 cm) Weight: 116 lb 13.5 oz (53 kg) IBW/kg (Calculated) : 43.2  Temp (24hrs), Avg:97.8 F (36.6 C), Min:97.7 F (36.5 C), Max:97.9 F (36.6 C)   Recent Labs Lab 09/02/16 1319 09/02/16 1319 09/06/16 2140 09/06/16 2156 09/06/16 2255 09/07/16 0008  WBC 6.8  --   --   --  14.4*  --   CREATININE  --  0.7 2.77*  --   --   --   LATICACIDVEN  --   --   --  5.63*  --  1.27    Estimated Creatinine Clearance: 12.6 mL/min (A) (by C-G formula based on SCr of 2.77 mg/dL (H)).    No Known Allergies  Antimicrobials this admission: Vancomycin 09/07/2016 >> Cefepime 09/07/2016 >>   Dose adjustments this admission: -  Microbiology results: pending  Thank you for allowing pharmacy to be a part of this patient's care.  Christina Peterson 09/07/2016 4:58 AM

## 2016-09-07 NOTE — Consult Note (Signed)
Reason for Consult:ileus Referring Physician: Dr Gerrit Halls is an 76 y.o. female.  HPI: 50yof with recently diagnosed NHL who was admitted recently to hospital and discharged. Received chemotherapy on 5/14 and due in early June for next cycle.  At home she has not had a bm, was not tolerating diet, had n/v, abd pain.  No fevers. She is very weak. She is not very communicative right now. Her family is in the room. She was seen in er and found to be hyponatremic, cr 2.77, elevated lactate and ct shows possible pna as well as what appears to be ileus.   Past Medical History:  Diagnosis Date  . Arthritis   . Hypercholesteremia   . Hypertension    FOLLOWED BY DR Katherine Roan    Past Surgical History:  Procedure Laterality Date  . ABDOMINAL HYSTERECTOMY    . IR FLUORO GUIDE PORT INSERTION RIGHT  08/23/2016  . IR US GUIDE VASC ACCESS RIGHT  08/23/2016  . JOINT REPLACEMENT    . 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 TOTAL HIP REVISION  05/02/2011   Procedure: TOTAL HIP REVISION;  Surgeon: Sharmon Revere, MD;  Location: [place];  Service: Orthopedics;  Laterality: Left;  left total hip revision with bone grafting    History reviewed. No pertinent family history.  Social History:  reports that she has never smoked. She has never used smokeless tobacco. She reports that she does not drink alcohol or use drugs.  Allergies: No Known Allergies  Medications: I have reviewed the patient's current medications.  Results for orders placed or performed during the hospital encounter of 09/06/16 (from the past 48 hour(s))  Comprehensive metabolic panel     Status: Abnormal   Collection Time: 09/06/16  9:40 PM  Result Value Ref Range   Sodium 123 (L) 135 - 145 mmol/L   Potassium 3.8 3.5 - 5.1 mmol/L   Chloride 74 (L) 101 - 111 mmol/L   CO2 28 22 - 32 mmol/L   Glucose, Bld 121 (H) 65 - 99 mg/dL   BUN 35 (H) 6 - 20 mg/dL   Creatinine, Ser 2.77 (H) 0.44 - 1.00 mg/dL   Calcium 9.9  8.9 - 10.3 mg/dL   Total Protein 6.9 6.5 - 8.1 g/dL   Albumin 3.8 3.5 - 5.0 g/dL   AST 26 15 - 41 U/L   ALT 39 14 - 54 U/L   Alkaline Phosphatase 154 (H) 38 - 126 U/L   Total Bilirubin 1.8 (H) 0.3 - 1.2 mg/dL   GFR calc non Af Amer 15 (L) >60 mL/min   GFR calc Af Amer 18 (L) >60 mL/min    Comment: (NOTE) The eGFR has been calculated using the CKD EPI equation. This calculation has not been validated in all clinical situations. eGFR's persistently <60 mL/min signify possible Chronic Kidney Disease.    Anion gap 21 (H) 5 - 15  Lipase, blood     Status: None   Collection Time: 09/06/16  9:40 PM  Result Value Ref Range   Lipase 19 11 - 51 U/L  I-Stat CG4 Lactic Acid, ED     Status: Abnormal   Collection Time: 09/06/16  9:56 PM  Result Value Ref Range   Lactic Acid, Venous 5.63 (HH) 0.5 - 1.9 mmol/L   Comment NOTIFIED PHYSICIAN   CBC     Status: Abnormal   Collection Time: 09/06/16 10:55 PM  Result Value Ref Range   WBC  14.4 (H) 4.0 - 10.5 K/uL   RBC 3.78 (L) 3.87 - 5.11 MIL/uL   Hemoglobin 10.5 (L) 12.0 - 15.0 g/dL   HCT 31.8 (L) 36.0 - 46.0 %   MCV 84.1 78.0 - 100.0 fL   MCH 27.8 26.0 - 34.0 pg   MCHC 33.0 30.0 - 36.0 g/dL   RDW 15.1 11.5 - 15.5 %   Platelets 150 150 - 400 K/uL  Differential     Status: Abnormal   Collection Time: 09/06/16 10:55 PM  Result Value Ref Range   Neutrophils Relative % 96 %   Lymphocytes Relative 2 %   Monocytes Relative 2 %   Eosinophils Relative 0 %   Basophils Relative 0 %   Neutro Abs 13.8 (H) 1.7 - 7.7 K/uL   Lymphs Abs 0.3 (L) 0.7 - 4.0 K/uL   Monocytes Absolute 0.3 0.1 - 1.0 K/uL   Eosinophils Absolute 0.0 0.0 - 0.7 K/uL   Basophils Absolute 0.0 0.0 - 0.1 K/uL   WBC Morphology INCREASED BANDS (>20% BANDS)     Comment: MILD LEFT SHIFT (1-5% METAS, OCC MYELO, OCC BANDS) TOXIC GRANULATION VACUOLATED NEUTROPHILS   I-Stat CG4 Lactic Acid, ED  (not at  [facility])     Status: None   Collection Time: 09/07/16 12:08 AM  Result Value Ref Range    Lactic Acid, Venous 1.27 0.5 - 1.9 mmol/L  CBC     Status: Abnormal   Collection Time: 09/07/16  6:41 AM  Result Value Ref Range   WBC 14.9 (H) 4.0 - 10.5 K/uL   RBC 4.09 3.87 - 5.11 MIL/uL   Hemoglobin 11.6 (L) 12.0 - 15.0 g/dL   HCT 34.9 (L) 36.0 - 46.0 %   MCV 85.3 78.0 - 100.0 fL   MCH 28.4 26.0 - 34.0 pg   MCHC 33.2 30.0 - 36.0 g/dL   RDW 15.3 11.5 - 15.5 %   Platelets 149 (L) 150 - 400 K/uL  Comprehensive metabolic panel     Status: Abnormal   Collection Time: 09/07/16  6:41 AM  Result Value Ref Range   Sodium 126 (L) 135 - 145 mmol/L   Potassium 3.8 3.5 - 5.1 mmol/L   Chloride 87 (L) 101 - 111 mmol/L   CO2 29 22 - 32 mmol/L   Glucose, Bld 117 (H) 65 - 99 mg/dL   BUN 32 (H) 6 - 20 mg/dL   Creatinine, Ser 1.74 (H) 0.44 - 1.00 mg/dL    Comment: DELTA CHECK NOTED   Calcium 8.7 (L) 8.9 - 10.3 mg/dL   Total Protein 5.4 (L) 6.5 - 8.1 g/dL   Albumin 2.9 (L) 3.5 - 5.0 g/dL   AST 21 15 - 41 U/L   ALT 27 14 - 54 U/L   Alkaline Phosphatase 100 38 - 126 U/L   Total Bilirubin 1.7 (H) 0.3 - 1.2 mg/dL   GFR calc non Af Amer 27 (L) >60 mL/min   GFR calc Af Amer 31 (L) >60 mL/min    Comment: (NOTE) The eGFR has been calculated using the CKD EPI equation. This calculation has not been validated in all clinical situations. eGFR's persistently <60 mL/min signify possible Chronic Kidney Disease.    Anion gap 10 5 - 15  Phosphorus     Status: None   Collection Time: 09/07/16  6:41 AM  Result Value Ref Range   Phosphorus 4.4 2.5 - 4.6 mg/dL  Magnesium     Status: Abnormal   Collection Time: 09/07/16  6:41 AM  Result Value Ref Range   Magnesium 1.4 (L) 1.7 - 2.4 mg/dL    Ct Abdomen Pelvis Wo Contrast  Result Date: 09/06/2016 CLINICAL DATA:  Generalize weakness. Abdominal pain. 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 Weakness, nausea, and vomiting since chemotherapy. EXAM: CT ABDOMEN AND PELVIS WITHOUT CONTRAST TECHNIQUE: Multidetector CT imaging of the abdomen and pelvis  was performed following the standard protocol without IV contrast. COMPARISON:  08/12/2016 FINDINGS: Lower chest: Diffuse nodular consolidation throughout both lung bases. This is nonspecific in could be due to multifocal pneumonia, aspiration, or lymphoma. Cardiac enlargement. Mildly distended fluid-filled esophagus suggesting reflux or dysmotility. Hepatobiliary: No focal liver lesions identified. Gallbladder is unremarkable. Bile ducts are mildly dilated, likely due to extrinsic compression. Pancreas: Pancreas appears grossly unremarkable. Large lymph node adjacent to the pancreatic head measures 4.2 cm in diameter. This was better seen on the previous study with contrast. The lesion appears unchanged in size. Spleen: Spleen size is normal.  No focal lesions identified. Adrenals/Urinary Tract: Adrenal glands are unremarkable. Kidneys are normal, without renal calculi, focal lesion, or hydronephrosis. Bladder is unremarkable. Stomach/Bowel: The stomach is diffusely distended with fluid. No gastric wall thickening. Duodenum and proximal small bowel are mildly distended and filled with fluid and gas. No definite wall thickening. The distal small bowel is relatively decompressed. The colon is also mildly distended. Prominent stool in the cecum. Small bowel obstruction is not excluded the changes could be due to decreased motility is well. Vascular/Lymphatic: Calcification of the abdominal aorta. No aneurysm. Prominent celiac axis, mesenteric, and retroperitoneal lymphadenopathy is better depicted on previous contrast-enhanced scan but appears to be relatively unchanged. Reproductive: Status post hysterectomy. No adnexal masses. Other: No free air or free fluid in the abdomen. Abdominal wall musculature appears intact. Musculoskeletal: Postoperative left hip arthroplasty. Degenerative changes throughout the spine. Multiple focal areas of vague lucency demonstrated in the bones probably representing metastatic disease.  IMPRESSION: Diffuse nodular consolidation throughout both lung bases could indicate multifocal pneumonia, aspiration, or lymphoma involvement. This is new since previous study. Abdominal and retroperitoneal lymphadenopathy appears similar to prior study. Comparison is difficult due to lack of IV contrast material. Probable lymphoma involvement of the bones. Fluid distention of the esophagus, stomach, proximal small bowel, and colon. Obstruction is not excluded but changes could be due to decreased motility. Electronically Signed   By: Lucienne Capers M.D.   On: 09/06/2016 23:41   Dg Chest Port 1 View  Result Date: 09/07/2016 CLINICAL DATA:  Acute onset of generalized weakness. Stage 3 lymphoma. Nausea and vomiting. Initial encounter. EXAM: PORTABLE CHEST 1 VIEW COMPARISON:  Chest radiograph performed 10/01/2015, and CT of the chest performed 08/12/2016 FINDINGS: Bibasilar airspace opacities raise concern for pneumonia. No definite pleural effusion or pneumothorax is seen. The cardiomediastinal silhouette is mildly enlarged. No acute osseous abnormalities are identified. A right-sided chest port is noted ending about the distal SVC. IMPRESSION: 1. Bibasilar airspace opacities raise concern for pneumonia. 2. Mild cardiomegaly. Electronically Signed   By: Garald Balding M.D.   On: 09/07/2016 00:12    Review of Systems  Constitutional: Negative for chills and fever.  Cardiovascular: Negative for chest pain and palpitations.  Gastrointestinal: Positive for abdominal pain, nausea and vomiting. Negative for constipation and diarrhea.  All other systems reviewed and are negative.  Blood pressure 106/66, pulse 88, temperature 98.3 F (36.8 C), temperature source Axillary, resp. rate 20, height _0  (1.499 m), weight 53 kg (116 lb 13.5 oz), SpO2 97 %. Physical Exam  Constitutional:  She appears lethargic. She appears ill.  HENT:  Head: Normocephalic and atraumatic.  Right Ear: External ear normal.  Left  Ear: External ear normal.  Mouth/Throat: Oropharynx is clear and moist.  Eyes:  Pupils equal   Neck: Neck supple.  Cardiovascular: Normal rate, regular rhythm and normal heart sounds.   Respiratory: Effort normal and breath sounds normal.  GI: She exhibits distension. Bowel sounds are decreased. There is no tenderness.  Neurological: She appears lethargic.    Assessment/Plan: Likely ileus  I dont think has sbo.  Will plan to repeat abd  Xray this am and if distended she will need ng tube placed. I discussed this with family and patient. I dont think she has any bowel function now best I can gather.  Her lactate is now normal, na better and cr improving consistent with signficant dehydration.  Will follow with you  [facility] 09/07/2016, 8:45 AM

## 2016-09-08 ENCOUNTER — Inpatient Hospital Stay (HOSPITAL_COMMUNITY): Payer: Medicare Other

## 2016-09-08 LAB — BASIC METABOLIC PANEL
Anion gap: 7 (ref 5–15)
Anion gap: 7 (ref 5–15)
BUN: 25 mg/dL — AB (ref 6–20)
BUN: 18 mg/dL (ref 6–20)
CALCIUM: 8.4 mg/dL — AB (ref 8.9–10.3)
CO2: 30 mmol/L (ref 22–32)
CO2: 31 mmol/L (ref 22–32)
CREATININE: 1.17 mg/dL — AB (ref 0.44–1.00)
CREATININE: 0.98 mg/dL (ref 0.44–1.00)
Calcium: 8.3 mg/dL — ABNORMAL LOW (ref 8.9–10.3)
Chloride: 94 mmol/L — ABNORMAL LOW (ref 101–111)
Chloride: 94 mmol/L — ABNORMAL LOW (ref 101–111)
GFR calc Af Amer: 51 mL/min — ABNORMAL LOW (ref >60)
GFR calc Af Amer: >60 mL/min (ref >60)
GFR, EST NON AFRICAN AMERICAN: 44 mL/min — AB (ref >60)
GFR, EST NON AFRICAN AMERICAN: 54 mL/min — AB (ref >60)
GLUCOSE: 132 mg/dL — AB (ref 65–99)
GLUCOSE: 137 mg/dL — AB (ref 65–99)
Potassium: 2.5 mmol/L — CL (ref 3.5–5.1)
Potassium: 3.1 mmol/L — ABNORMAL LOW (ref 3.5–5.1)
SODIUM: 132 mmol/L — AB (ref 135–145)
Sodium: 131 mmol/L — ABNORMAL LOW (ref 135–145)

## 2016-09-08 LAB — CBC
HCT: 28.4 % — ABNORMAL LOW (ref 36.0–46.0)
Hemoglobin: 9.6 g/dL — ABNORMAL LOW (ref 12.0–15.0)
MCH: 28.7 pg (ref 26.0–34.0)
MCHC: 33.8 g/dL (ref 30.0–36.0)
MCV: 84.8 fL (ref 78.0–100.0)
Platelets: 147 10*3/uL — ABNORMAL LOW (ref 150–400)
RBC: 3.35 MIL/uL — ABNORMAL LOW (ref 3.87–5.11)
RDW: 15.4 % (ref 11.5–15.5)
WBC: 15 10*3/uL — ABNORMAL HIGH (ref 4.0–10.5)

## 2016-09-08 LAB — MAGNESIUM: MAGNESIUM: 1.9 mg/dL (ref 1.7–2.4)

## 2016-09-08 MED ORDER — KCL IN DEXTROSE-NACL 20-5-0.9 MEQ/L-%-% IV SOLN
INTRAVENOUS | Status: DC
  Administered 2016-09-08 – 2016-09-11 (×7): via INTRAVENOUS
  Filled 2016-09-08 (×9): qty 1000

## 2016-09-08 MED ORDER — POTASSIUM CHLORIDE 2 MEQ/ML IV SOLN: INTRAVENOUS | Status: DC

## 2016-09-08 MED ORDER — POTASSIUM CHLORIDE 10 MEQ/100ML IV SOLN
10.0000 meq | INTRAVENOUS | Status: AC
  Administered 2016-09-08 (×5): 10 meq via INTRAVENOUS
  Filled 2016-09-08 (×5): qty 100

## 2016-09-08 MED ORDER — PANTOPRAZOLE SODIUM 40 MG IV SOLR
40.0000 mg | Freq: Two times a day (BID) | INTRAVENOUS | Status: DC
  Administered 2016-09-08 – 2016-09-11 (×8): 40 mg via INTRAVENOUS
  Filled 2016-09-08 (×8): qty 40

## 2016-09-08 NOTE — Progress Notes (Signed)
Initial Nutrition Assessment  DOCUMENTATION CODES:   Severe malnutrition in context of acute illness/injury  INTERVENTION:   RD will order supplements when diet advanced  Recommend Nutrition Support if unable to advance diet in 1-2 days. Pt is at high refeeding risk.   NUTRITION DIAGNOSIS:   Malnutrition (severe) related to cancer and cancer related treatments, acute illness (PNA with sepsis) as evidenced by moderate depletion of body fat, severe depletion of muscle mass, energy intake < or equal to 50% for > or equal to 5 days.  GOAL:   Patient will meet greater than or equal to 90% of their needs  MONITOR:   Diet advancement, Labs, Weight trends, I & O's  REASON FOR ASSESSMENT:   Malnutrition Screening Tool    ASSESSMENT:   76 y.o. female with medical history significant of HTN, HLD, severe mitral regurgitation, newly diagnosed non-Hodgkin's lymphoma; who presents with complaints of generalized weakness. Patient was last admitted to the hospital from 5/9 - 5/15 for hypovolemic hyponatremia, abdominal pain, and generalized weakness. Patient was discharged home, but since that time has not been doing well. Therefore that the patient has not had a bowel movement in 10 days. Family notes the patient was taking MiraLAX and trying other remedies such as prune juice without relief of symptoms. For the last 4 days she has had persistent nausea and vomiting unable to keep any food, liquid, or medications down. Furthermore, they note patient complaining of having worsening abdominal pain that she describes as crampy and distention. She had received 1 chemotherapy treatment on 5/14 prior to being discharged. Pt admitted with sepsis, ileus vs SBO, and AKI   Unable to communicate with pt, pt lethargic at time of visit. Pt with NGT tube in place. Per chart, pt with many weight fluctuations but appears to be weight stable since February. Pt not meeting estimated needs for > 5 days now. Recommend  nutrition support if unable to advance diet in 1-2 days. Pt with low potassium; continue to monitor and supplement as needed per MD discretion. Pt is at high refeeding risk.   Medications reviewed and include: heparin, dextrose and NaCl w/ KCl, zosyn, KCl, vancomycin  Labs reviewed: Na 131(L), K 2.5(L), Cl 94(L), BUN 25(H), creat 1.17(H), Ca 8.4(L), P 4.4 wnl, Mg 1.9 wnl, Alb 2.9 Wbc- 15(H), Hgb 9.6(L), Hct 28.4(L)  Nutrition-Focused physical exam completed. Findings are moderate fat depletion in orbital regions and chest, moderate to severe muscle depletions over entire body, and no edema.   Diet Order:  Diet NPO time specified Except for: Sips with Meds  Skin:  Reviewed, no issues  Last BM:  5/15  Height:   Ht Readings from Last 1 Encounters:  09/07/16 5\' 5"  (1.651 m)    Weight:   Wt Readings from Last 1 Encounters:  09/08/16 117 lb 11.2 oz (53.4 kg)    Ideal Body Weight:  56.8 kg  BMI:  Body mass index is 19.59 kg/m.  Estimated Nutritional Needs:   Kcal:  1600-1800kcal/day   Protein:  80-90g/day   Fluid:  >1.6L/day   EDUCATION NEEDS:   Education needs no appropriate at this time  Koleen Distance MS, RD, LDN Pager #- [phone]

## 2016-09-08 NOTE — Clinical Social Work Note (Signed)
Clinical Social Work Assessment  Patient Details  Name: Christina Peterson MRN: [id number] Date of Birth: [birth year]  Date of referral:  09/08/16               Reason for consult:  Facility Placement, [employer], Discharge Planning                Permission sought to share information with:  Case Freight forwarder, Family Supports Permission granted to share information::  Yes, Verbal Permission Granted  Name::        Agency::     Relationship::  family at bedside: granddaughter Pharmacist, hospital Information:     Housing/Transportation Living arrangements for the past 2 months:  Single Family Home Source of Information:  Medical Team, Case Manager, Adult Children, Other (Comment Required) (other family) Patient Interpreter Needed:  None Criminal Activity/Legal Involvement Pertinent to Current Situation/Hospitalization:  No - Comment as needed Significant Relationships:  Other Family Members, Adult Children, Friend Lives with:  Self, Adult Children Do you feel safe going back to the place where you live?  Yes Need for family participation in patient care:  Yes (Comment)  Care giving concerns:  At the current time, no concerns noted by family at the bedside.  More concerned about next steps and the amount of fluid coming out of the NG tube/surgical plans.  Patient lives with her daughter and granddaughter with home health coming into home: [place].  Reports patient main problem was eating and being able to eat/keep food down.  Reports patient has a CNA and RN coming to visit weekly.  Reports she uses a walker and cane for DME at home.  Family anticipates she will want to come home at discharge as she has been to SNF in the past and did not like it.  Unclear of what she will need and want to see how she advances before working towards plan for disposition.   Social Worker assessment / plan:  Assessment completed with family by the bedside. Patient from home, consult for dc planning/SNF.   Currently patient too medically acute to address disposition, but family leaning more towards home if possible since patient was not happy with SNF in the past.  Explained to family role of SW in hospital and assistance with disposition of needs.  Appreciative and agreeable to work with SW and assist as patient progresses.  Plan: TBA at this time.  Employment status:  Retired Forensic scientist:  Medicare PT Recommendations:  Not assessed at this time Information / Referral to community resources:     Patient/Family's Response to care:  Understanding and agreeable.  Patient/Family's Understanding of and Emotional Response to Diagnosis, Current Treatment, and Prognosis:  Family in agreement and had many questions regarding course of care and needs.  Emotional Assessment Appearance:  Appears stated age Attitude/Demeanor/Rapport:  Unresponsive Affect (typically observed):  Pleasant, Quiet Orientation:  Oriented to Self, Oriented to Place Alcohol / Substance use:  Not Applicable Psych involvement (Current and /or in the community):  No (Comment)  Discharge Needs  Concerns to be addressed:  Adjustment to Illness, Care Coordination Readmission within the last 30 days:  Yes Current discharge risk:  Chronically ill Barriers to Discharge:  Continued Medical Work up   Marshell Garfinkel 09/08/2016, 12:42 PM

## 2016-09-08 NOTE — Progress Notes (Signed)
[facility] Office:  [phone] General Surgery Progress Note   LOS: 1 day  POD -     Chief Complaint: Abdominal pain, does not feel good  Assessment and Plan: 1.  Ilues with significant gastric distention  Has NGT - 1,450 cc recorded last 24 hours  KUB today pending.  Her abdomen still hurts, but better than yesterday?  2.  NHL - followed by Dr. Irene Limbo 3.  Mitral regurgitation 4.  Sepsis  WBC - 15,000 - 09/08/2016  On Zosyn/Vanc 5.  Suspected pneumonia by CT 6.  Hypokalemia  K+ - 2.5 - 09/08/2016  Getting 5 runs of K+ today 7.  DVT prophylaxis - SQ heparin   Principal Problem:   Sepsis ([place]) Active Problems:   Hyponatremia   Normocytic normochromic anemia   Constipation   Severe protein-calorie malnutrition (HCC)   Diffuse large B-cell lymphoma of lymph nodes of multiple regions (HCC)   Nausea and vomiting   Ileus (HCC)  Subjective:  Mumbles and a little hard to understand.  Abdomen better.  No hungry.  Objective:   Vitals:   09/08/16 0528 09/08/16 0600  BP:    Pulse:    Resp:  (!) 22  Temp: 98.2 F (36.8 C)      Intake/Output from previous day:  05/26 0701 - 05/27 0700 In: 2576.7 [I.V.:1446.7; NG/GT:30; IV Piggyback:1100] Out: 1450 [Emesis/NG output:1450]  Intake/Output this shift:  No intake/output data recorded.   Physical Exam:   General: Older AA F who is alert.  Has NGT   HEENT: Normal. Pupils equal. .   Lungs: Some rhonchi.   Abdomen: Rare BS.  No localized tednerness.   Extremities:  Has changes of RA in her hands   Lab Results:    Recent Labs  09/07/16 0641 09/08/16 0617  WBC 14.9* 15.0*  HGB 11.6* 9.6*  HCT 34.9* 28.4*  PLT 149* 147*    BMET   Recent Labs  09/07/16 0641 09/08/16 0617  NA 126* 131*  K 3.8 2.5*  CL 87* 94*  CO2 29 30  GLUCOSE 117* 132*  BUN 32* 25*  CREATININE 1.74* 1.17*  CALCIUM 8.7* 8.4*    PT/INR  No results for input(s): LABPROT, INR in the last 72 hours.  ABG  No results for  input(s): PHART, HCO3 in the last 72 hours.  Invalid input(s): PCO2, PO2   Studies/Results:  Ct Abdomen Pelvis Wo Contrast  Result Date: 09/06/2016 CLINICAL DATA:  Generalize weakness. Abdominal pain. 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 Weakness, nausea, and vomiting since chemotherapy. EXAM: CT ABDOMEN AND PELVIS WITHOUT CONTRAST TECHNIQUE: Multidetector CT imaging of the abdomen and pelvis was performed following the standard protocol without IV contrast. COMPARISON:  08/12/2016 FINDINGS: Lower chest: Diffuse nodular consolidation throughout both lung bases. This is nonspecific in could be due to multifocal pneumonia, aspiration, or lymphoma. Cardiac enlargement. Mildly distended fluid-filled esophagus suggesting reflux or dysmotility. Hepatobiliary: No focal liver lesions identified. Gallbladder is unremarkable. Bile ducts are mildly dilated, likely due to extrinsic compression. Pancreas: Pancreas appears grossly unremarkable. Large lymph node adjacent to the pancreatic head measures 4.2 cm in diameter. This was better seen on the previous study with contrast. The lesion appears unchanged in size. Spleen: Spleen size is normal.  No focal lesions identified. Adrenals/Urinary Tract: Adrenal glands are unremarkable. Kidneys are normal, without renal calculi, focal lesion, or hydronephrosis. Bladder is unremarkable. Stomach/Bowel: The stomach is diffusely distended with fluid. No gastric wall thickening. Duodenum and proximal small bowel are mildly  distended and filled with fluid and gas. No definite wall thickening. The distal small bowel is relatively decompressed. The colon is also mildly distended. Prominent stool in the cecum. Small bowel obstruction is not excluded the changes could be due to decreased motility is well. Vascular/Lymphatic: Calcification of the abdominal aorta. No aneurysm. Prominent celiac axis, mesenteric, and retroperitoneal lymphadenopathy is better depicted on previous  contrast-enhanced scan but appears to be relatively unchanged. Reproductive: Status post hysterectomy. No adnexal masses. Other: No free air or free fluid in the abdomen. Abdominal wall musculature appears intact. Musculoskeletal: Postoperative left hip arthroplasty. Degenerative changes throughout the spine. Multiple focal areas of vague lucency demonstrated in the bones probably representing metastatic disease. IMPRESSION: Diffuse nodular consolidation throughout both lung bases could indicate multifocal pneumonia, aspiration, or lymphoma involvement. This is new since previous study. Abdominal and retroperitoneal lymphadenopathy appears similar to prior study. Comparison is difficult due to lack of IV contrast material. Probable lymphoma involvement of the bones. Fluid distention of the esophagus, stomach, proximal small bowel, and colon. Obstruction is not excluded but changes could be due to decreased motility. Electronically Signed   By: Lucienne Capers M.D.   On: 09/06/2016 23:41   Dg Abd 1 View  Result Date: 09/07/2016 CLINICAL DATA:  Post NG tube placement EXAM: ABDOMEN - 1 VIEW COMPARISON:  Earlier same day FINDINGS: Enteric tube tip and side port projected the expected location of the gastric antrum. Worsening gas distention of multiple loops of large and small bowel with index loop of small bowel measuring approximately 2.5 cm in diameter. No supine evidence of pneumoperitoneum. No definite pneumatosis or portal venous gas. Multiple phleboliths overlie the lower pelvis bilaterally, left greater than right. Limited visualization of lower thorax demonstrates air bronchograms within the image right lower lung. Post left total hip replacement. Moderate degenerative change the right hip with joint space loss, subchondral sclerosis and osteophytosis. IMPRESSION: 1. Enteric tube tip and side port project over the expected location of the gastric antrum. 2. Findings suggestive of worsening ileus. 3.  Findings worrisome for right lower lung pneumonia. Further evaluation dedicated chest radiograph could be performed as indicated. Electronically Signed   By: Sandi Mariscal M.D.   On: 09/07/2016 13:32   Dg Abd 1 View  Result Date: 09/07/2016 CLINICAL DATA:  Weakness.  Nausea and vomiting. EXAM: ABDOMEN - 1 VIEW COMPARISON:  CT scan Sep 06, 2016 FINDINGS: Increased density in the upper abdomen is consistent with the fluid-filled distended stomach seen on yesterday's CT scan. Air-filled loops of large and small bowel may represent ileus. No other acute abnormalities. IMPRESSION: 1. Increased density in the upper abdomen consistent with the distended fluid-filled stomach seen on yesterday's CT scan. 2. Air-filled mildly prominent loops of large and small bowel may represent ileus. Electronically Signed   By: Dorise Bullion III M.D   On: 09/07/2016 09:14   Dg Chest Port 1 View  Result Date: 09/07/2016 CLINICAL DATA:  Hypoxia. EXAM: PORTABLE CHEST 1 VIEW COMPARISON:  Radiograph of Sep 06, 2016. FINDINGS: Stable cardiomegaly. No pneumothorax is noted. Nasogastric tube is seen entering stomach with distal tip in distal stomach. Right internal jugular Port-A-Cath is noted with distal tip in expected position of cavoatrial junction. Bilateral lower lobe opacities are noted, right greater than left, consistent with edema or pneumonia. Minimal bilateral pleural effusions cannot be excluded. Bony thorax is unremarkable. IMPRESSION: Stable bibasilar opacities are noted most consistent with pneumonia or possibly edema. Minimal bilateral pleural effusions may be present. Interval placement  of nasogastric tube with distal tip in expected position of distal stomach. Electronically Signed   By: Marijo Conception, M.D.   On: 09/07/2016 14:12   Dg Chest Port 1 View  Result Date: 09/07/2016 CLINICAL DATA:  Acute onset of generalized weakness. Stage 3 lymphoma. Nausea and vomiting. Initial encounter. EXAM: PORTABLE CHEST 1 VIEW  COMPARISON:  Chest radiograph performed 10/01/2015, and CT of the chest performed 08/12/2016 FINDINGS: Bibasilar airspace opacities raise concern for pneumonia. No definite pleural effusion or pneumothorax is seen. The cardiomediastinal silhouette is mildly enlarged. No acute osseous abnormalities are identified. A right-sided chest port is noted ending about the distal SVC. IMPRESSION: 1. Bibasilar airspace opacities raise concern for pneumonia. 2. Mild cardiomegaly. Electronically Signed   By: Garald Balding M.D.   On: 09/07/2016 00:12     Anti-infectives:   Anti-infectives    Start     Dose/Rate Route Frequency Ordered Stop   09/08/16 2200  vancomycin (VANCOCIN) IVPB 750 mg/150 ml premix  Status:  Discontinued     750 mg 150 mL/hr over 60 Minutes Intravenous Every 48 hours 09/07/16 0457 09/07/16 0917   09/07/16 2200  ceFEPIme (MAXIPIME) 1 g in dextrose 5 % 50 mL IVPB  Status:  Discontinued     1 g 100 mL/hr over 30 Minutes Intravenous Every 24 hours 09/07/16 0457 09/07/16 1404   09/07/16 1600  piperacillin-tazobactam (ZOSYN) IVPB 3.375 g     3.375 g 12.5 mL/hr over 240 Minutes Intravenous Every 8 hours 09/07/16 1421     09/07/16 1000  vancomycin (VANCOCIN) 500 mg in sodium chloride 0.9 % 100 mL IVPB     500 mg 100 mL/hr over 60 Minutes Intravenous Every 24 hours 09/07/16 0917     09/07/16 0000  ceFEPIme (MAXIPIME) 2 g in dextrose 5 % 50 mL IVPB     2 g 100 mL/hr over 30 Minutes Intravenous  Once 09/06/16 2349 09/07/16 0155   09/07/16 0000  vancomycin (VANCOCIN) IVPB 1000 mg/200 mL premix     1,000 mg 200 mL/hr over 60 Minutes Intravenous  Once 09/06/16 2349 09/07/16 0155      Alphonsa Overall, MD, FACS Pager: [facility] Office: [phone] 09/08/2016

## 2016-09-08 NOTE — Progress Notes (Addendum)
PROGRESS NOTE    Christina Peterson  SWN:[id number] DOB: [birth year] DOA: 09/06/2016 PCP: Christina Bowels, MD (Inactive)   Brief Narrative: Christina Peterson is a 76 y.o. female with medical history significant of HTN, HLD, severe mitral regurgitation, newly diagnosed non-Hodgkin's lymphoma; who presents with complaints of generalized weakness. Patient was last admitted to the hospital from 5/9 - 5/15 for hypovolemic hyponatremia, abdominal pain, and generalized weakness. Patient was discharged home, but since that time has not been doing well. Therefore that the patient has not had a bowel movement in 10 days. Family notes the patient was taking MiraLAX and trying other remedies such as prune juice without relief of symptoms. For the last 4 days she has had persistent nausea and vomiting unable to keep any food, liquid, or medications down. Furthermore, they note patient complaining of having worsening abdominal pain that she describes as crampy and distention. She had received 1 chemotherapy treatment on 5/14 prior to being discharged. Family denies any significant fevers, chest pain, hematemesis, dysuria, or diarrhea. The patient's daughter wonders if the patient can receive any more assistance at home.  ED Course: Upo n admission into the emergency department the patient was found to have WBC 14.4, sodium 123, chloride 74, BUN 35, creatinine 2.77, and  lactic acid of 5.63. CT imaging shows signs of possible multifocal pneumonia and fluid distention within the esophagus, stomach, and small bowel. Severe sepsis protocol was initiated and patient was placed on broad spectrum antibiotics of Vancomycin and cefepime given recent hospitalization. General surgery was consulted given concern for possible ileus.   Assessment & Plan:   Principal Problem:   Sepsis ([place]) Active Problems:   Hyponatremia   Normocytic normochromic anemia   Constipation   Severe protein-calorie malnutrition (HCC)   Diffuse  large B-cell lymphoma of lymph nodes of multiple regions (HCC)   Nausea and vomiting   Ileus (HCC)   1-Sepsis;  Secondary to PNA vs intra-abdominal process.  CT ; diffuse nodular opacities B/L lungs.  WBC 14--15.  Lactic acid trending down.  Follow blood culture.  Continue with Cefepime and Vancomycin.  BP improved with IV fluids.   2-Hyponatremia; related to hypovolemia.  Improving with IV fluids.   3-Ileus vs SBO.; -Surgery following.  -IV fluids.  -NG tube in place. She had 1.4 L from NG tube on insertion.  -KUB pending.  -700 cc from NG.   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 -Patient last received received chemotherapy, (R mini CHOP) plus tumor lysis syndrome prophylaxis with allopurinol on 5/14. -Follows with Christina Peterson. Added to rounding team.   5-AKI; Secondary to hypovolemia, infection.  Improving with IV fluids.  Cr peak to 2.7--1.7.---1.1  Acute encephalopathy; confused, lethargic.  She is more alert than yesterday. Able to answer some questions.   Hypokalemia; IV KCL 5 runs.  KCL added to IV fluids.  Repeat labs tonight.   Severe protein calorie malnutrition Resume diet when able to tolerate.   Hypomagnesemia; replete IV.   Anemia; follow trend.   A fib RVR;  Rate controlled.  IV PRN metoprolol.  Suspect related to acute illness.  HR improved with IV fluids./   DVT prophylaxis: Heparin  Code Status: Full Code.  Family Communication: no family at bedside.  Disposition Plan: to be determine   Consultants:   Surgery    Procedures:  NG tube placement    Antimicrobials:   Vancomycin 5-25  Cefepime 5-25   Subjective: She is  not feeling well, report some abdominal pain. Passing gas.  Last BM 1 week ago. She is able to answer some questions today. Confused.  Appears more alert.   Objective: Vitals:   09/08/16 0500 09/08/16 0528 09/08/16 0600  09/08/16 0700  BP: 134/68   (!) 149/57  Pulse:      Resp: (!) 9  (!) 22 19  Temp:  98.2 F (36.8 C)    TempSrc:  Oral    SpO2: 98%  98% 99%  Weight:      Height:        Intake/Output Summary (Last 24 hours) at 09/08/16 0824 Last data filed at 09/08/16 0800  Gross per 24 hour  Intake          2889.17 ml  Output             1450 ml  Net          1439.17 ml   Filed Weights   09/06/16 2125 09/07/16 0149 09/07/16 1600  Weight: 49 kg (108 lb) 53 kg (116 lb 13.5 oz) 52.5 kg (115 lb 11.9 oz)    Examination:  General exam: NAD. Follows some command  Respiratory system: CTA.  Cardiovascular system: S1 & S2 heard, RRR. No JVD, murmurs, rubs, gallops or clicks. No pedal edema. Gastrointestinal system: Soft, BS present. Mild tenderness on palpation.  Central nervous system: Alert and oriented. No focal neurological deficits. Extremities: moves extremities passively Skin: No rash.      Data Reviewed: I have personally reviewed following labs and imaging studies  CBC:  Recent Labs Lab 09/02/16 1319 09/06/16 2255 09/07/16 0641 09/08/16 0617  WBC 6.8 14.4* 14.9* 15.0*  NEUTROABS 6.1 13.8*  --   --   HGB 11.9 10.5* 11.6* 9.6*  HCT 36.8 31.8* 34.9* 28.4*  MCV 87.6 84.1 85.3 84.8  PLT 100* 150 149* 825*   Basic Metabolic Panel:  Recent Labs Lab 09/02/16 1319 09/02/16 1319 09/06/16 2140 09/07/16 0641 09/08/16 0617  NA 131*  --  123* 126* 131*  K 3.3*  --  3.8 3.8 2.5*  CL  --   --  74* 87* 94*  CO2 34*  --  28 29 30   GLUCOSE 117  --  121* 117* 132*  BUN 12.3  --  35* 32* 25*  CREATININE 0.7  --  2.77* 1.74* 1.17*  CALCIUM 9.9  --  9.9 8.7* 8.4*  MG 1.6  --   --  1.4* 1.9  PHOS  --  2.4*  --  4.4  --    GFR: Estimated Creatinine Clearance: 33.4 mL/min (A) (by C-G formula based on SCr of 1.17 mg/dL (H)). Liver Function Tests:  Recent Labs Lab 09/02/16 1319 09/06/16 2140 09/07/16 0641  AST 29 26 21   ALT 78* 39 27  ALKPHOS 211* 154* 100  BILITOT 2.88* 1.8*  1.7*  PROT 6.5 6.9 5.4*  ALBUMIN 3.7 3.8 2.9*    Recent Labs Lab 09/06/16 2140  LIPASE 19   No results for input(s): AMMONIA in the last 168 hours. Coagulation Profile: No results for input(s): INR, PROTIME in the last 168 hours. Cardiac Enzymes: No results for input(s): CKTOTAL, CKMB, CKMBINDEX, TROPONINI in the last 168 hours. BNP (last 3 results) No results for input(s): PROBNP in the last 8760 hours. HbA1C: No results for input(s): HGBA1C in the last 72 hours. CBG:  Recent Labs Lab 09/07/16 1225  GLUCAP 101*   Lipid Profile: No results for input(s): CHOL, HDL, LDLCALC, TRIG, CHOLHDL,  LDLDIRECT in the last 72 hours. Thyroid Function Tests: No results for input(s): TSH, T4TOTAL, FREET4, T3FREE, THYROIDAB in the last 72 hours. Anemia Panel: No results for input(s): VITAMINB12, FOLATE, FERRITIN, TIBC, IRON, RETICCTPCT in the last 72 hours. Sepsis Labs:  Recent Labs Lab 09/06/16 2156 09/07/16 0008 09/07/16 1726 09/07/16 2133  LATICACIDVEN 5.63* 1.27 1.1 0.9    Recent Results (from the past 240 hour(s))  MRSA PCR Screening     Status: None   Collection Time: 09/07/16  4:00 PM  Result Value Ref Range Status   MRSA by PCR NEGATIVE NEGATIVE Final    Comment:        The GeneXpert MRSA Assay (FDA approved for NASAL specimens only), is one component of a comprehensive MRSA colonization surveillance program. It is not intended to diagnose MRSA infection nor to guide or monitor treatment for MRSA infections.          Radiology Studies: Ct Abdomen Pelvis Wo Contrast  Result Date: 09/06/2016 CLINICAL DATA:  Generalize weakness. Abdominal pain. 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 Weakness, nausea, and vomiting since chemotherapy. EXAM: CT ABDOMEN AND PELVIS WITHOUT CONTRAST TECHNIQUE: Multidetector CT imaging of the abdomen and pelvis was performed following the standard protocol without IV contrast. COMPARISON:  08/12/2016 FINDINGS: Lower chest:  Diffuse nodular consolidation throughout both lung bases. This is nonspecific in could be due to multifocal pneumonia, aspiration, or lymphoma. Cardiac enlargement. Mildly distended fluid-filled esophagus suggesting reflux or dysmotility. Hepatobiliary: No focal liver lesions identified. Gallbladder is unremarkable. Bile ducts are mildly dilated, likely due to extrinsic compression. Pancreas: Pancreas appears grossly unremarkable. Large lymph node adjacent to the pancreatic head measures 4.2 cm in diameter. This was better seen on the previous study with contrast. The lesion appears unchanged in size. Spleen: Spleen size is normal.  No focal lesions identified. Adrenals/Urinary Tract: Adrenal glands are unremarkable. Kidneys are normal, without renal calculi, focal lesion, or hydronephrosis. Bladder is unremarkable. Stomach/Bowel: The stomach is diffusely distended with fluid. No gastric wall thickening. Duodenum and proximal small bowel are mildly distended and filled with fluid and gas. No definite wall thickening. The distal small bowel is relatively decompressed. The colon is also mildly distended. Prominent stool in the cecum. Small bowel obstruction is not excluded the changes could be due to decreased motility is well. Vascular/Lymphatic: Calcification of the abdominal aorta. No aneurysm. Prominent celiac axis, mesenteric, and retroperitoneal lymphadenopathy is better depicted on previous contrast-enhanced scan but appears to be relatively unchanged. Reproductive: Status post hysterectomy. No adnexal masses. Other: No free air or free fluid in the abdomen. Abdominal wall musculature appears intact. Musculoskeletal: Postoperative left hip arthroplasty. Degenerative changes throughout the spine. Multiple focal areas of vague lucency demonstrated in the bones probably representing metastatic disease. IMPRESSION: Diffuse nodular consolidation throughout both lung bases could indicate multifocal pneumonia,  aspiration, or lymphoma involvement. This is new since previous study. Abdominal and retroperitoneal lymphadenopathy appears similar to prior study. Comparison is difficult due to lack of IV contrast material. Probable lymphoma involvement of the bones. Fluid distention of the esophagus, stomach, proximal small bowel, and colon. Obstruction is not excluded but changes could be due to decreased motility. Electronically Signed   By: Lucienne Capers M.D.   On: 09/06/2016 23:41   Dg Abd 1 View  Result Date: 09/07/2016 CLINICAL DATA:  Post NG tube placement EXAM: ABDOMEN - 1 VIEW COMPARISON:  Earlier same day FINDINGS: Enteric tube tip and side port projected the expected location of the gastric antrum. Worsening gas  distention of multiple loops of large and small bowel with index loop of small bowel measuring approximately 2.5 cm in diameter. No supine evidence of pneumoperitoneum. No definite pneumatosis or portal venous gas. Multiple phleboliths overlie the lower pelvis bilaterally, left greater than right. Limited visualization of lower thorax demonstrates air bronchograms within the image right lower lung. Post left total hip replacement. Moderate degenerative change the right hip with joint space loss, subchondral sclerosis and osteophytosis. IMPRESSION: 1. Enteric tube tip and side port project over the expected location of the gastric antrum. 2. Findings suggestive of worsening ileus. 3. Findings worrisome for right lower lung pneumonia. Further evaluation dedicated chest radiograph could be performed as indicated. Electronically Signed   By: Sandi Mariscal M.D.   On: 09/07/2016 13:32   Dg Abd 1 View  Result Date: 09/07/2016 CLINICAL DATA:  Weakness.  Nausea and vomiting. EXAM: ABDOMEN - 1 VIEW COMPARISON:  CT scan Sep 06, 2016 FINDINGS: Increased density in the upper abdomen is consistent with the fluid-filled distended stomach seen on yesterday's CT scan. Air-filled loops of large and small bowel may  represent ileus. No other acute abnormalities. IMPRESSION: 1. Increased density in the upper abdomen consistent with the distended fluid-filled stomach seen on yesterday's CT scan. 2. Air-filled mildly prominent loops of large and small bowel may represent ileus. Electronically Signed   By: Dorise Bullion III M.D   On: 09/07/2016 09:14   Dg Chest Port 1 View  Result Date: 09/07/2016 CLINICAL DATA:  Hypoxia. EXAM: PORTABLE CHEST 1 VIEW COMPARISON:  Radiograph of Sep 06, 2016. FINDINGS: Stable cardiomegaly. No pneumothorax is noted. Nasogastric tube is seen entering stomach with distal tip in distal stomach. Right internal jugular Port-A-Cath is noted with distal tip in expected position of cavoatrial junction. Bilateral lower lobe opacities are noted, right greater than left, consistent with edema or pneumonia. Minimal bilateral pleural effusions cannot be excluded. Bony thorax is unremarkable. IMPRESSION: Stable bibasilar opacities are noted most consistent with pneumonia or possibly edema. Minimal bilateral pleural effusions may be present. Interval placement of nasogastric tube with distal tip in expected position of distal stomach. Electronically Signed   By: Marijo Conception, M.D.   On: 09/07/2016 14:12   Dg Chest Port 1 View  Result Date: 09/07/2016 CLINICAL DATA:  Acute onset of generalized weakness. Stage 3 lymphoma. Nausea and vomiting. Initial encounter. EXAM: PORTABLE CHEST 1 VIEW COMPARISON:  Chest radiograph performed 10/01/2015, and CT of the chest performed 08/12/2016 FINDINGS: Bibasilar airspace opacities raise concern for pneumonia. No definite pleural effusion or pneumothorax is seen. The cardiomediastinal silhouette is mildly enlarged. No acute osseous abnormalities are identified. A right-sided chest port is noted ending about the distal SVC. IMPRESSION: 1. Bibasilar airspace opacities raise concern for pneumonia. 2. Mild cardiomegaly. Electronically Signed   By: Garald Balding M.D.   On:  09/07/2016 00:12        Scheduled Meds: . heparin  5,000 Units Subcutaneous Q8H   Continuous Infusions: . acetaminophen    . dextrose 5 %-0.9% NaCl with KCl Pediatric custom IV fluid    . piperacillin-tazobactam (ZOSYN)  IV 3.375 g (09/08/16 0800)  . potassium chloride 10 mEq (09/08/16 0800)  . vancomycin Stopped (09/07/16 1119)     LOS: 1 day    Time spent: 35 minutes.     Elmarie Shiley, MD [facility] Pager [phone]  If 7PM-7AM, please contact night-coverage [email or web address] Password [facility] 09/08/2016, 8:24 AM

## 2016-09-09 LAB — CBC
HEMATOCRIT: 28.6 % — AB (ref 36.0–46.0)
HEMOGLOBIN: 9.5 g/dL — AB (ref 12.0–15.0)
MCH: 28.8 pg (ref 26.0–34.0)
MCHC: 33.2 g/dL (ref 30.0–36.0)
MCV: 86.7 fL (ref 78.0–100.0)
Platelets: 183 10*3/uL (ref 150–400)
RBC: 3.3 MIL/uL — AB (ref 3.87–5.11)
RDW: 15.4 % (ref 11.5–15.5)
WBC: 17.9 10*3/uL — ABNORMAL HIGH (ref 4.0–10.5)

## 2016-09-09 LAB — BASIC METABOLIC PANEL
Anion gap: 5 (ref 5–15)
BUN: 13 mg/dL (ref 6–20)
CHLORIDE: 95 mmol/L — AB (ref 101–111)
CO2: 34 mmol/L — AB (ref 22–32)
CREATININE: 0.89 mg/dL (ref 0.44–1.00)
Calcium: 8.4 mg/dL — ABNORMAL LOW (ref 8.9–10.3)
GFR calc Af Amer: >60 mL/min (ref >60)
GFR calc non Af Amer: >60 mL/min (ref >60)
Glucose, Bld: 131 mg/dL — ABNORMAL HIGH (ref 65–99)
POTASSIUM: 3.1 mmol/L — AB (ref 3.5–5.1)
Sodium: 134 mmol/L — ABNORMAL LOW (ref 135–145)

## 2016-09-09 LAB — MAGNESIUM: MAGNESIUM: 1.5 mg/dL — AB (ref 1.7–2.4)

## 2016-09-09 MED ORDER — POTASSIUM CHLORIDE 10 MEQ/50ML IV SOLN
10.0000 meq | INTRAVENOUS | Status: AC
  Administered 2016-09-09: 10 meq via INTRAVENOUS
  Filled 2016-09-09 (×2): qty 50

## 2016-09-09 MED ORDER — POTASSIUM CHLORIDE 10 MEQ/100ML IV SOLN
10.0000 meq | INTRAVENOUS | Status: AC
  Administered 2016-09-09 (×2): 10 meq via INTRAVENOUS
  Filled 2016-09-09 (×2): qty 100

## 2016-09-09 MED ORDER — VANCOMYCIN HCL 500 MG IV SOLR
500.0000 mg | Freq: Two times a day (BID) | INTRAVENOUS | Status: DC
  Administered 2016-09-09: 500 mg via INTRAVENOUS
  Filled 2016-09-09: qty 500

## 2016-09-09 MED ORDER — MAGNESIUM SULFATE 2 GM/50ML IV SOLN
2.0000 g | Freq: Once | INTRAVENOUS | Status: AC
  Administered 2016-09-09: 2 g via INTRAVENOUS
  Filled 2016-09-09: qty 50

## 2016-09-09 NOTE — Evaluation (Signed)
Physical Therapy Evaluation Patient Details Name: Christina Peterson MRN: [id number] DOB: 1941-01-29 Today's Date: 09/09/2016   History of Present Illness  Christina Peterson is a 76 y.o. female with medical history significant of HTN, HLD, severe mitral regurgitation, newly diagnosed non-Hodgkin's lymphoma; who presents with complaints of generalized weakness. Patient was last admitted to the hospital from 5/9 - 5/15 for hypovolemic hyponatremia, abdominal pain, and generalized weakness. Patient was discharged home, but since that time has not been doing well., adm for sepsis/pna, ileus  Clinical Impression  Pt admitted with above diagnosis. Pt currently with functional limitations due to the deficits listed below (see PT Problem List). * Pt will benefit from skilled PT to increase their independence and safety with mobility to allow discharge to the venue listed below.        Follow Up Recommendations SNF (vs HHPT depending on home support/progress/LOS)    Equipment Recommendations  None recommended by PT    Recommendations for Other Services       Precautions / Restrictions Precautions Precautions: Fall Restrictions Weight Bearing Restrictions: No NGT      Mobility  Bed Mobility Overal bed mobility: Needs Assistance Bed Mobility: Supine to Sit     Supine to sit: Min assist;+2 for safety/equipment     General bed mobility comments: light assist for trunk, partial roll, use of bed rail  Transfers Overall transfer level: Needs assistance Equipment used: None;Rolling walker (2 wheeled) Transfers: Sit to/from [facility] Sit to Stand: Min assist;+2 safety/equipment Stand pivot transfers: +2 safety/equipment;Min assist       General transfer comment: multi-modal cues for  hand placement and safe techniques, stand pivot x2  Ambulation/Gait                Stairs            Wheelchair Mobility    Modified Rankin (Stroke Patients Only)        Balance Overall balance assessment: Needs assistance   Sitting balance-Leahy Scale: Fair       Standing balance-Leahy Scale: Poor Standing balance comment: reliant on UEs                             Pertinent Vitals/Pain Pain Assessment: Faces Pain Location: does not specify Pain Descriptors / Indicators: Grimacing Pain Intervention(s): Monitored during session    Home Living Family/patient expects to be discharged to:: Private residence Living Arrangements: Alone Available Help at Discharge: Home health (aide and RN 1x per wk (per family)) Type of Home: Apartment Home Access: Elevator     Home Layout: One level Home Equipment: Mining engineer - 4 wheels Additional Comments: family reports they have been helping pt more since last admission to hospital a few weeks ago    Prior Function Level of Independence: Independent with assistive device(s)         Comments: amb with cane or RW at times more recently     Hand Dominance        Extremity/Trunk Assessment   Upper Extremity Assessment Upper Extremity Assessment: Generalized weakness    Lower Extremity Assessment Lower Extremity Assessment: Generalized weakness       Communication   Communication: No difficulties  Cognition Arousal/Alertness: Awake/alert Behavior During Therapy: Flat affect Overall Cognitive Status: Difficult to assess  General Comments: pt difficult to understand at times, incr time to follow commands, falls asleep easily      General Comments      Exercises     Assessment/Plan    PT Assessment Patient needs continued PT services  PT Problem List Decreased strength;Decreased activity tolerance;Decreased balance;Decreased mobility       PT Treatment Interventions DME instruction;Gait training;Functional mobility training;Therapeutic exercise;Therapeutic activities;Patient/family education    PT Goals (Current  goals can be found in the Care Plan section)  Acute Rehab PT Goals Patient Stated Goal: none stated PT Goal Formulation: With patient Time For Goal Achievement: 09/23/16 Potential to Achieve Goals: Good    Frequency Min 3X/week   Barriers to discharge        Co-evaluation               AM-PAC PT "6 Clicks" Daily Activity  Outcome Measure Difficulty turning over in bed (including adjusting bedclothes, sheets and blankets)?: A Little Difficulty moving from lying on back to sitting on the side of the bed? : A Little Difficulty sitting down on and standing up from a chair with arms (e.g., wheelchair, bedside commode, etc,.)?: A Lot Help needed moving to and from a bed to chair (including a wheelchair)?: A Lot Help needed walking in hospital room?: A Lot Help needed climbing 3-5 steps with a railing? : A Lot 6 Click Score: 14    End of Session Equipment Utilized During Treatment: Gait belt Activity Tolerance: Patient limited by fatigue;Other (comment) (nausea) Patient left: in chair;with call bell/phone within reach;with family/visitor present Nurse Communication: Mobility status PT Visit Diagnosis: Other abnormalities of gait and mobility (R26.89);Muscle weakness (generalized) (M62.81)    Time: 4287-6811 PT Time Calculation (min) (ACUTE ONLY): 23 min   Charges:   PT Evaluation $PT Eval Moderate Complexity: 1 Procedure PT Treatments $Therapeutic Activity: 8-22 mins   PT G Codes:          Bruchy Mikel Oct 03, 2016, 3:50 PM

## 2016-09-09 NOTE — Progress Notes (Signed)
PROGRESS NOTE    Christina Peterson  ZOX:[id number] DOB: [birth year] DOA: 09/06/2016 PCP: Julius Bowels, MD (Inactive)   Brief Narrative: Christina Peterson is a 76 y.o. female with medical history significant of HTN, HLD, severe mitral regurgitation, newly diagnosed non-Hodgkin's lymphoma; who presents with complaints of generalized weakness. Patient was last admitted to the hospital from 5/9 - 5/15 for hypovolemic hyponatremia, abdominal pain, and generalized weakness. Patient was discharged home, but since that time has not been doing well. Therefore that the patient has not had a bowel movement in 10 days. Family notes the patient was taking MiraLAX and trying other remedies such as prune juice without relief of symptoms. For the last 4 days she has had persistent nausea and vomiting unable to keep any food, liquid, or medications down. Furthermore, they note patient complaining of having worsening abdominal pain that she describes as crampy and distention. She had received 1 chemotherapy treatment on 5/14 prior to being discharged. Family denies any significant fevers, chest pain, hematemesis, dysuria, or diarrhea. The patient's daughter wonders if the patient can receive any more assistance at home.  ED Course: Upo n admission into the emergency department the patient was found to have WBC 14.4, sodium 123, chloride 74, BUN 35, creatinine 2.77, and  lactic acid of 5.63. CT imaging shows signs of possible multifocal pneumonia and fluid distention within the esophagus, stomach, and small bowel. Severe sepsis protocol was initiated and patient was placed on broad spectrum antibiotics of Vancomycin and cefepime given recent hospitalization. General surgery was consulted given concern for possible ileus.   Assessment & Plan:   Principal Problem:   Sepsis ([place]) Active Problems:   Hyponatremia   Normocytic normochromic anemia   Constipation   Severe protein-calorie malnutrition (HCC)   Diffuse  large B-cell lymphoma of lymph nodes of multiple regions (HCC)   Nausea and vomiting   Ileus (HCC)   1-Sepsis;  Secondary to PNA vs intra-abdominal process.  CT ; diffuse nodular opacities B/L lungs.  WBC 14--15-17 Lactic acid trending down.  Follow blood culture no growth in 24 hours.  Continue with Cefepime and Vancomycin day 2, WBC increasing.    2-Hyponatremia; related to hypovolemia.  Improving with IV fluids.   3-Ileus vs SBO.; -Surgery following. Considering ileus./  -IV fluids.  -NG tube in place. She had 1.4 L from NG tube on insertion. No further output.  -KUB; no evidence of Bowel obstruction.    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 -Patient last received received chemotherapy, (R mini CHOP) plus tumor lysis syndrome prophylaxis with allopurinol on 5/14. -Follows with Dr Irene Limbo. Added to rounding team.   5-AKI; Secondary to hypovolemia, infection.  Improving with IV fluids.  Cr peak to 2.7--1.7.---1.1 Stable.   Acute encephalopathy; confused, lethargic.  She is more alert than yesterday. Able to answer some questions.   Hypokalemia; replete with IV kcl.   Severe protein calorie malnutrition Resume diet when able to tolerate.   Hypomagnesemia; IV magnesium ordered. Repeat labs in am.   Anemia; follow trend.   A fib RVR;  Rate controlled.  IV PRN metoprolol.  Suspect related to acute illness.  HR improved with IV fluids./   DVT prophylaxis: Heparin  Code Status: Full Code.  Family Communication: no family at bedside.  Disposition Plan: to be determine   Consultants:   Surgery    Procedures:  NG tube placement    Antimicrobials:   Vancomycin 5-25  Cefepime 5-25  Subjective: Sleepy, wake up answer questions. Follows command.  Relates feeling better today. Abdominal pain persist , but is better   Objective: Vitals:   09/09/16 0200 09/09/16 0323  09/09/16 0400 09/09/16 0500  BP: (!) 146/77  132/80 (!) 151/89  Pulse:      Resp: (!) 21     Temp:  98.5 F (36.9 C)    TempSrc:  Oral    SpO2: 99%  97%   Weight:      Height:        Intake/Output Summary (Last 24 hours) at 09/09/16 0737 Last data filed at 09/09/16 0649  Gross per 24 hour  Intake           1787.5 ml  Output             1775 ml  Net             12.5 ml   Filed Weights   09/07/16 0149 09/07/16 1600 09/08/16 1154  Weight: 53 kg (116 lb 13.5 oz) 52.5 kg (115 lb 11.9 oz) 53.4 kg (117 lb 11.2 oz)    Examination:  General exam: NAD, NGT in place.  Respiratory system: Bilateral air movement, few ronchus Cardiovascular system: S1 & S2 heard, RRR. No JVD, murmurs, rubs, gallops or clicks. No pedal edema. Gastrointestinal system; soft, nd, mild tender.  Central nervous system: Alert and oriented. No focal neurological deficits. Extremities: moves extremities passively Skin: No rash.      Data Reviewed: I have personally reviewed following labs and imaging studies  CBC:  Recent Labs Lab 09/02/16 1319 09/06/16 2255 09/07/16 0641 09/08/16 0617 09/09/16 0300  WBC 6.8 14.4* 14.9* 15.0* 17.9*  NEUTROABS 6.1 13.8*  --   --   --   HGB 11.9 10.5* 11.6* 9.6* 9.5*  HCT 36.8 31.8* 34.9* 28.4* 28.6*  MCV 87.6 84.1 85.3 84.8 86.7  PLT 100* 150 149* 147* 629   Basic Metabolic Panel:  Recent Labs Lab 09/02/16 1319 09/02/16 1319 09/06/16 2140 09/07/16 0641 09/08/16 0617 09/08/16 1833 09/09/16 0252 09/09/16 0300  NA 131*  --  123* 126* 131* 132*  --  134*  K 3.3*  --  3.8 3.8 2.5* 3.1*  --  3.1*  CL  --   --  74* 87* 94* 94*  --  95*  CO2 34*  --  28 29 30 31   --  34*  GLUCOSE 117  --  121* 117* 132* 137*  --  131*  BUN 12.3  --  35* 32* 25* 18  --  13  CREATININE 0.7  --  2.77* 1.74* 1.17* 0.98  --  0.89  CALCIUM 9.9  --  9.9 8.7* 8.4* 8.3*  --  8.4*  MG 1.6  --   --  1.4* 1.9  --  1.5*  --   PHOS  --  2.4*  --  4.4  --   --   --   --     GFR: Estimated Creatinine Clearance: 44.6 mL/min (by C-G formula based on SCr of 0.89 mg/dL). Liver Function Tests:  Recent Labs Lab 09/02/16 1319 09/06/16 2140 09/07/16 0641  AST 29 26 21   ALT 78* 39 27  ALKPHOS 211* 154* 100  BILITOT 2.88* 1.8* 1.7*  PROT 6.5 6.9 5.4*  ALBUMIN 3.7 3.8 2.9*    Recent Labs Lab 09/06/16 2140  LIPASE 19   No results for input(s): AMMONIA in the last 168 hours. Coagulation Profile: No results for input(s): INR, PROTIME  in the last 168 hours. Cardiac Enzymes: No results for input(s): CKTOTAL, CKMB, CKMBINDEX, TROPONINI in the last 168 hours. BNP (last 3 results) No results for input(s): PROBNP in the last 8760 hours. HbA1C: No results for input(s): HGBA1C in the last 72 hours. CBG:  Recent Labs Lab 09/07/16 1225  GLUCAP 101*   Lipid Profile: No results for input(s): CHOL, HDL, LDLCALC, TRIG, CHOLHDL, LDLDIRECT in the last 72 hours. Thyroid Function Tests: No results for input(s): TSH, T4TOTAL, FREET4, T3FREE, THYROIDAB in the last 72 hours. Anemia Panel: No results for input(s): VITAMINB12, FOLATE, FERRITIN, TIBC, IRON, RETICCTPCT in the last 72 hours. Sepsis Labs:  Recent Labs Lab 09/06/16 2156 09/07/16 0008 09/07/16 1726 09/07/16 2133  LATICACIDVEN 5.63* 1.27 1.1 0.9    Recent Results (from the past 240 hour(s))  Blood Culture (routine x 2)     Status: None (Preliminary result)   Collection Time: 09/06/16 11:01 PM  Result Value Ref Range Status   Specimen Description BLOOD RIGHT ANTECUBITAL  Final   Special Requests IN PEDIATRIC BOTTLE Blood Culture adequate volume  Final   Culture   Final    NO GROWTH 1 DAY Performed at [facility], [place] [address], [place], [place] [address]    Report Status PENDING  Incomplete  Blood Culture (routine x 2)     Status: None (Preliminary result)   Collection Time: 09/06/16 11:07 PM  Result Value Ref Range Status   Specimen Description BLOOD PORT  Final   Special Requests    Final    BOTTLES DRAWN AEROBIC AND ANAEROBIC Blood Culture adequate volume   Culture   Final    NO GROWTH 1 DAY Performed at [facility], [place] [address] [place], [place] [address]    Report Status PENDING  Incomplete  MRSA PCR Screening     Status: None   Collection Time: 09/07/16  4:00 PM  Result Value Ref Range Status   MRSA by PCR NEGATIVE NEGATIVE Final    Comment:        The GeneXpert MRSA Assay (FDA approved for NASAL specimens only), is one component of a comprehensive MRSA colonization surveillance program. It is not intended to diagnose MRSA infection nor to guide or monitor treatment for MRSA infections.          Radiology Studies: Dg Abd 1 View  Result Date: 09/07/2016 CLINICAL DATA:  Post NG tube placement EXAM: ABDOMEN - 1 VIEW COMPARISON:  Earlier same day FINDINGS: Enteric tube tip and side port projected the expected location of the gastric antrum. Worsening gas distention of multiple loops of large and small bowel with index loop of small bowel measuring approximately 2.5 cm in diameter. No supine evidence of pneumoperitoneum. No definite pneumatosis or portal venous gas. Multiple phleboliths overlie the lower pelvis bilaterally, left greater than right. Limited visualization of lower thorax demonstrates air bronchograms within the image right lower lung. Post left total hip replacement. Moderate degenerative change the right hip with joint space loss, subchondral sclerosis and osteophytosis. IMPRESSION: 1. Enteric tube tip and side port project over the expected location of the gastric antrum. 2. Findings suggestive of worsening ileus. 3. Findings worrisome for right lower lung pneumonia. Further evaluation dedicated chest radiograph could be performed as indicated. Electronically Signed   By: Sandi Mariscal M.D.   On: 09/07/2016 13:32   Dg Abd 1 View  Result Date: 09/07/2016 CLINICAL DATA:  Weakness.  Nausea and vomiting. EXAM: ABDOMEN - 1 VIEW COMPARISON:  CT scan Sep 06, 2016 FINDINGS: Increased density in the upper abdomen is consistent with the fluid-filled distended stomach seen on yesterday's CT scan. Air-filled loops of large and small bowel may represent ileus. No other acute abnormalities. IMPRESSION: 1. Increased density in the upper abdomen consistent with the distended fluid-filled stomach seen on yesterday's CT scan. 2. Air-filled mildly prominent loops of large and small bowel may represent ileus. Electronically Signed   By: Dorise Bullion III M.D   On: 09/07/2016 09:14   Dg Chest Port 1 View  Result Date: 09/07/2016 CLINICAL DATA:  Hypoxia. EXAM: PORTABLE CHEST 1 VIEW COMPARISON:  Radiograph of Sep 06, 2016. FINDINGS: Stable cardiomegaly. No pneumothorax is noted. Nasogastric tube is seen entering stomach with distal tip in distal stomach. Right internal jugular Port-A-Cath is noted with distal tip in expected position of cavoatrial junction. Bilateral lower lobe opacities are noted, right greater than left, consistent with edema or pneumonia. Minimal bilateral pleural effusions cannot be excluded. Bony thorax is unremarkable. IMPRESSION: Stable bibasilar opacities are noted most consistent with pneumonia or possibly edema. Minimal bilateral pleural effusions may be present. Interval placement of nasogastric tube with distal tip in expected position of distal stomach. Electronically Signed   By: Marijo Conception, M.D.   On: 09/07/2016 14:12   Dg Abd 2 Views  Result Date: 09/08/2016 CLINICAL DATA:  Lethargy, abdominal pain, distension EXAM: ABDOMEN - 2 VIEW COMPARISON:  09/07/2016 FINDINGS: Nasogastric tube with the tip projecting over the antrum of the stomach. There is no bowel dilatation to suggest obstruction. There is no evidence of pneumoperitoneum, portal venous gas or pneumatosis. There are no pathologic calcifications along the expected course of the ureters. Total left hip arthroplasty without failure or complication. Moderate  osteoarthritis of the right hip. No aggressive osseous lesion. IMPRESSION: 1. Nasogastric tube with the tip projecting over the antrum of the stomach. 2. No evidence of bowel obstruction. Electronically Signed   By: Kathreen Devoid   On: 09/08/2016 11:29        Scheduled Meds: . heparin  5,000 Units Subcutaneous Q8H  . pantoprazole (PROTONIX) IV  40 mg Intravenous Q12H   Continuous Infusions: . dextrose 5 % and 0.9 % NaCl with KCl 20 mEq/L 100 mL/hr at 09/09/16 0526  . piperacillin-tazobactam (ZOSYN)  IV Stopped (09/09/16 0300)  . potassium chloride    . potassium chloride    . vancomycin       LOS: 2 days    Time spent: 35 minutes.     Elmarie Shiley, MD [facility] Pager [phone]  If 7PM-7AM, please contact night-coverage [email or web address] Password [facility] 09/09/2016, 7:37 AM

## 2016-09-09 NOTE — Progress Notes (Signed)
Pharmacy Antibiotic Note  Christina Peterson is a 76 y.o. female admitted on 09/06/2016 with pneumonia.  Pharmacy has been consulted for Vancomycin, cefepime dosing. Cefepime changed to Zosyn  Today, 09/09/2016  Day #3 antibiotics  WBC trending up but received pefilgrastim 5/16  Afebrile  BCx NGTD and MRSA PCR Negative  Plan:  Negative MRSA PCR correlates with 98-99% negative predictive value for MRSA pneumonia, consider stop vancomycin.  With improved renal function, if vancomycin to continue, dose should be increased to 500mg  IV q12h  Check trough at steady state  Zosyn 3.375g IV q8 (extended interval infusion) for CrCl > 20 ml/min but monitor SCr closely  Check daily SCr with increased risk of nephrotoxicity with concomitant vancomycin/zosyn   Height: 5\' 5"  (165.1 cm) Weight: 117 lb 11.2 oz (53.4 kg) (bed weight) IBW/kg (Calculated) : 57  Temp (24hrs), Avg:98.4 F (36.9 C), Min:98.1 F (36.7 C), Max:98.6 F (37 C)   Recent Labs Lab 09/02/16 1319  09/06/16 2140 09/06/16 2156 09/06/16 2255 09/07/16 0008 09/07/16 0641 09/07/16 1726 09/07/16 2133 09/08/16 0617 09/08/16 1833 09/09/16 0300  WBC 6.8  --   --   --  14.4*  --  14.9*  --   --  15.0*  --  17.9*  CREATININE  --   < > 2.77*  --   --   --  1.74*  --   --  1.17* 0.98 0.89  LATICACIDVEN  --   --   --  5.63*  --  1.27  --  1.1 0.9  --   --   --   < > = values in this interval not displayed.  Estimated Creatinine Clearance: 44.6 mL/min (by C-G formula based on SCr of 0.89 mg/dL).    No Known Allergies  Antimicrobials this admission:  5/26 Vanc >> 5/26 Cefepime >> 5/26 5/26 Zosyn >>  Dose adjustments this admission:  ---  Microbiology results:  5/25 BCx: NGTD 5/26 MRSA PCR: neg  Thank you for allowing pharmacy to be a part of this patient's care.  Doreene Eland, PharmD, BCPS.   Pager: [phone] 09/09/2016 7:25 AM

## 2016-09-09 NOTE — Care Management Note (Signed)
Case Management Note  Patient Details  Name: Christina Peterson MRN: [id number] Date of Birth: [birth year]  Subjective/Objective:                  Upo n admission into the emergency department the patient was found to have WBC 14.4, sodium 123, chloride 74, BUN 35, creatinine 2.77, and lactic acid of 5.63.CT imaging shows signs of possible multifocal pneumonia and fluid distention within the esophagus,stomach, and small bowel. Severe sepsis protocol was initiated and patient was placed on broad spectrum antibiotics of Vancomycin and cefepime given recent hospitalization. General surgery was consulted given concern for possible ileus.  Action/Plan: From home with adult children Date:  Sep 09, 2016 Chart reviewed for concurrent status and case management needs. Will continue to follow patient progress. Discharge Planning: following for needs Expected discharge date: [id number] [name], BSN, [place], [place] Expected Discharge Date:                  Expected Discharge Plan:  Home/Self Care  In-House Referral:     Discharge planning Services  CM Consult  Post Acute Care Choice:    Choice offered to:     DME Arranged:    DME Agency:     HH Arranged:    HH Agency:     Status of Service:  In process, will continue to follow  If discussed at Long Length of Stay Meetings, dates discussed:    Additional Comments:  Leeroy Cha, RN 09/09/2016, 8:33 AM

## 2016-09-09 NOTE — Progress Notes (Signed)
Subjective/Chief Complaint: Somnolent, not sure about bowel function   Objective: Vital signs in last 24 hours: Temp:  [98.1 F (36.7 C)-98.6 F (37 C)] 98.5 F (36.9 C) (05/28 0323) Resp:  [17-27] 21 (05/28 0200) BP: (130-156)/(57-91) 151/89 (05/28 0500) SpO2:  [90 %-100 %] 97 % (05/28 0400) Weight:  [53.4 kg (117 lb 11.2 oz)] 53.4 kg (117 lb 11.2 oz) (05/27 1154) Last BM Date:  (1 week ago per family )  Intake/Output from previous day: 05/27 0701 - 05/28 0700 In: 1787.5 [I.V.:1100; IV Piggyback:687.5] Out: 1400 [Emesis/NG output:1400] Intake/Output this shift: Total I/O In: -  Out: 400 [Emesis/NG output:400]  GI: much softer, some bs present, nontender  Lab Results:   Recent Labs  09/08/16 0617 09/09/16 0300  WBC 15.0* 17.9*  HGB 9.6* 9.5*  HCT 28.4* 28.6*  PLT 147* 183   BMET  Recent Labs  09/08/16 1833 09/09/16 0300  NA 132* 134*  K 3.1* 3.1*  CL 94* 95*  CO2 31 34*  GLUCOSE 137* 131*  BUN 18 13  CREATININE 0.98 0.89  CALCIUM 8.3* 8.4*   PT/INR No results for input(s): LABPROT, INR in the last 72 hours. ABG No results for input(s): PHART, HCO3 in the last 72 hours.  Invalid input(s): PCO2, PO2  Studies/Results: Dg Abd 1 View  Result Date: 09/07/2016 CLINICAL DATA:  Post NG tube placement EXAM: ABDOMEN - 1 VIEW COMPARISON:  Earlier same day FINDINGS: Enteric tube tip and side port projected the expected location of the gastric antrum. Worsening gas distention of multiple loops of large and small bowel with index loop of small bowel measuring approximately 2.5 cm in diameter. No supine evidence of pneumoperitoneum. No definite pneumatosis or portal venous gas. Multiple phleboliths overlie the lower pelvis bilaterally, left greater than right. Limited visualization of lower thorax demonstrates air bronchograms within the image right lower lung. Post left total hip replacement. Moderate degenerative change the right hip with joint space loss,  subchondral sclerosis and osteophytosis. IMPRESSION: 1. Enteric tube tip and side port project over the expected location of the gastric antrum. 2. Findings suggestive of worsening ileus. 3. Findings worrisome for right lower lung pneumonia. Further evaluation dedicated chest radiograph could be performed as indicated. Electronically Signed   By: Sandi Mariscal M.D.   On: 09/07/2016 13:32   Dg Abd 1 View  Result Date: 09/07/2016 CLINICAL DATA:  Weakness.  Nausea and vomiting. EXAM: ABDOMEN - 1 VIEW COMPARISON:  CT scan Sep 06, 2016 FINDINGS: Increased density in the upper abdomen is consistent with the fluid-filled distended stomach seen on yesterday's CT scan. Air-filled loops of large and small bowel may represent ileus. No other acute abnormalities. IMPRESSION: 1. Increased density in the upper abdomen consistent with the distended fluid-filled stomach seen on yesterday's CT scan. 2. Air-filled mildly prominent loops of large and small bowel may represent ileus. Electronically Signed   By: Dorise Bullion III M.D   On: 09/07/2016 09:14   Dg Chest Port 1 View  Result Date: 09/07/2016 CLINICAL DATA:  Hypoxia. EXAM: PORTABLE CHEST 1 VIEW COMPARISON:  Radiograph of Sep 06, 2016. FINDINGS: Stable cardiomegaly. No pneumothorax is noted. Nasogastric tube is seen entering stomach with distal tip in distal stomach. Right internal jugular Port-A-Cath is noted with distal tip in expected position of cavoatrial junction. Bilateral lower lobe opacities are noted, right greater than left, consistent with edema or pneumonia. Minimal bilateral pleural effusions cannot be excluded. Bony thorax is unremarkable. IMPRESSION: Stable bibasilar opacities are noted  most consistent with pneumonia or possibly edema. Minimal bilateral pleural effusions may be present. Interval placement of nasogastric tube with distal tip in expected position of distal stomach. Electronically Signed   By: Marijo Conception, M.D.   On: 09/07/2016 14:12    Dg Abd 2 Views  Result Date: 09/08/2016 CLINICAL DATA:  Lethargy, abdominal pain, distension EXAM: ABDOMEN - 2 VIEW COMPARISON:  09/07/2016 FINDINGS: Nasogastric tube with the tip projecting over the antrum of the stomach. There is no bowel dilatation to suggest obstruction. There is no evidence of pneumoperitoneum, portal venous gas or pneumatosis. There are no pathologic calcifications along the expected course of the ureters. Total left hip arthroplasty without failure or complication. Moderate osteoarthritis of the right hip. No aggressive osseous lesion. IMPRESSION: 1. Nasogastric tube with the tip projecting over the antrum of the stomach. 2. No evidence of bowel obstruction. Electronically Signed   By: Kathreen Devoid   On: 09/08/2016 11:29    Anti-infectives: Anti-infectives    Start     Dose/Rate Route Frequency Ordered Stop   09/08/16 2200  vancomycin (VANCOCIN) IVPB 750 mg/150 ml premix  Status:  Discontinued     750 mg 150 mL/hr over 60 Minutes Intravenous Every 48 hours 09/07/16 0457 09/07/16 0917   09/07/16 2200  ceFEPIme (MAXIPIME) 1 g in dextrose 5 % 50 mL IVPB  Status:  Discontinued     1 g 100 mL/hr over 30 Minutes Intravenous Every 24 hours 09/07/16 0457 09/07/16 1404   09/07/16 1600  piperacillin-tazobactam (ZOSYN) IVPB 3.375 g     3.375 g 12.5 mL/hr over 240 Minutes Intravenous Every 8 hours 09/07/16 1421     09/07/16 1000  vancomycin (VANCOCIN) 500 mg in sodium chloride 0.9 % 100 mL IVPB     500 mg 100 mL/hr over 60 Minutes Intravenous Every 24 hours 09/07/16 0917     09/07/16 0000  ceFEPIme (MAXIPIME) 2 g in dextrose 5 % 50 mL IVPB     2 g 100 mL/hr over 30 Minutes Intravenous  Once 09/06/16 2349 09/07/16 0155   09/07/16 0000  vancomycin (VANCOCIN) IVPB 1000 mg/200 mL premix     1,000 mg 200 mL/hr over 60 Minutes Intravenous  Once 09/06/16 2349 09/07/16 0155      Assessment/Plan: 1.Likely ileus- ng output last 24 is 1400 but most of that early in day, abd exam  much better and films yesterday much better, would leave ng until tomorrow, repeat films and may be able to be removed.  2. NHL- Dr Irene Limbo  3. PNA- on zosyn/vanc, wbc 17.9 (I do not think this is from abdomen)  4. Hypokalemia,- needs continued replacement     [facility] 09/09/2016

## 2016-09-10 ENCOUNTER — Inpatient Hospital Stay (HOSPITAL_COMMUNITY): Payer: Medicare Other

## 2016-09-10 LAB — BASIC METABOLIC PANEL
Anion gap: 4 — ABNORMAL LOW (ref 5–15)
BUN: 6 mg/dL (ref 6–20)
CALCIUM: 8.3 mg/dL — AB (ref 8.9–10.3)
CO2: 35 mmol/L — AB (ref 22–32)
CREATININE: 0.69 mg/dL (ref 0.44–1.00)
Chloride: 94 mmol/L — ABNORMAL LOW (ref 101–111)
GFR calc non Af Amer: >60 mL/min (ref >60)
Glucose, Bld: 120 mg/dL — ABNORMAL HIGH (ref 65–99)
Potassium: 3.1 mmol/L — ABNORMAL LOW (ref 3.5–5.1)
Sodium: 133 mmol/L — ABNORMAL LOW (ref 135–145)

## 2016-09-10 LAB — CBC
HEMATOCRIT: 31.1 % — AB (ref 36.0–46.0)
Hemoglobin: 9.7 g/dL — ABNORMAL LOW (ref 12.0–15.0)
MCH: 27.1 pg (ref 26.0–34.0)
MCHC: 31.2 g/dL (ref 30.0–36.0)
MCV: 86.9 fL (ref 78.0–100.0)
Platelets: 221 10*3/uL (ref 150–400)
RBC: 3.58 MIL/uL — ABNORMAL LOW (ref 3.87–5.11)
RDW: 15 % (ref 11.5–15.5)
WBC: 17.7 10*3/uL — ABNORMAL HIGH (ref 4.0–10.5)

## 2016-09-10 LAB — GLUCOSE, CAPILLARY: GLUCOSE-CAPILLARY: 120 mg/dL — AB (ref 65–99)

## 2016-09-10 LAB — MAGNESIUM: Magnesium: 1.3 mg/dL — ABNORMAL LOW (ref 1.7–2.4)

## 2016-09-10 MED ORDER — MILK AND MOLASSES ENEMA
1.0000 | Freq: Once | RECTAL | Status: AC
  Administered 2016-09-10: 250 mL via RECTAL
  Filled 2016-09-10: qty 250

## 2016-09-10 MED ORDER — PROCHLORPERAZINE EDISYLATE 5 MG/ML IJ SOLN: 2.5000 mg | Freq: Four times a day (QID) | INTRAMUSCULAR | Status: DC | PRN

## 2016-09-10 MED ORDER — MAGNESIUM SULFATE 2 GM/50ML IV SOLN
2.0000 g | Freq: Once | INTRAVENOUS | Status: AC
  Administered 2016-09-10: 2 g via INTRAVENOUS
  Filled 2016-09-10: qty 50

## 2016-09-10 MED ORDER — POTASSIUM CHLORIDE 10 MEQ/100ML IV SOLN
10.0000 meq | INTRAVENOUS | Status: AC
  Administered 2016-09-10 (×6): 10 meq via INTRAVENOUS
  Filled 2016-09-10 (×6): qty 100

## 2016-09-10 MED ORDER — CHLORHEXIDINE GLUCONATE CLOTH 2 % EX PADS
6.0000 | MEDICATED_PAD | Freq: Every day | CUTANEOUS | Status: DC
  Administered 2016-09-11: 6 via TOPICAL

## 2016-09-10 NOTE — Progress Notes (Signed)
Subjective: Arousable.  Normal conversation.  Says her abdomen hurts like it always does.  No worse and no  better  no stool or flatus NG output only 300 mL. Potassium 3.1.  Creatinine 0.69.  Hemoglobin 9.7, stable.  WBC 17,700. Abdominal x-ray pending this morning  on vancomycin and Zosyn for presumed pneumonia.   On heparin for DVT prophylaxis  Objective: Vital signs in last 24 hours: Temp:  [98 F (36.7 C)-98.6 F (37 C)] 98.2 F (36.8 C) (05/29 0301) Resp:  [17-26] 26 (05/29 0400) BP: (132-150)/(71-94) 138/71 (05/29 0400) SpO2:  [88 %-99 %] 97 % (05/29 0400) Last BM Date: 08/18/16  Intake/Output from previous day: 05/28 0701 - 05/29 0700 In: 3960 [I.V.:3400; NG/GT:60; IV Piggyback:500] Out: 0174 [Urine:1375; Emesis/NG output:300] Intake/Output this shift: Total I/O In: 2130 [I.V.:1900; NG/GT:30; IV Piggyback:200] Out: 1350 [Urine:1050; Emesis/NG output:300]  General appearance: Alert.  Low BMI.  Deconditioned. Resp: Moving air reasonably well.  Occasional rhonchi bases. GI: Abdomen fairly soft.  Mostly nontender.  No mass.  No hernia.  Hypoactive bowel sounds.  No significant distention  Lab Results:   Recent Labs  09/09/16 0300 09/10/16 0452  WBC 17.9* 17.7*  HGB 9.5* 9.7*  HCT 28.6* 31.1*  PLT 183 221   BMET  Recent Labs  09/09/16 0300 09/10/16 0452  NA 134* 133*  K 3.1* 3.1*  CL 95* 94*  CO2 34* 35*  GLUCOSE 131* 120*  BUN 13 6  CREATININE 0.89 0.69  CALCIUM 8.4* 8.3*   PT/INR No results for input(s): LABPROT, INR in the last 72 hours. ABG No results for input(s): PHART, HCO3 in the last 72 hours.  Invalid input(s): PCO2, PO2  Studies/Results: Dg Abd 2 Views  Result Date: 09/08/2016 CLINICAL DATA:  Lethargy, abdominal pain, distension EXAM: ABDOMEN - 2 VIEW COMPARISON:  09/07/2016 FINDINGS: Nasogastric tube with the tip projecting over the antrum of the stomach. There is no bowel dilatation to suggest obstruction. There is no evidence of  pneumoperitoneum, portal venous gas or pneumatosis. There are no pathologic calcifications along the expected course of the ureters. Total left hip arthroplasty without failure or complication. Moderate osteoarthritis of the right hip. No aggressive osseous lesion. IMPRESSION: 1. Nasogastric tube with the tip projecting over the antrum of the stomach. 2. No evidence of bowel obstruction. Electronically Signed   By: Kathreen Devoid   On: 09/08/2016 11:29    Anti-infectives: Anti-infectives    Start     Dose/Rate Route Frequency Ordered Stop   09/09/16 1000  vancomycin (VANCOCIN) 500 mg in sodium chloride 0.9 % 100 mL IVPB  Status:  Discontinued     500 mg 100 mL/hr over 60 Minutes Intravenous Every 12 hours 09/09/16 0726 09/09/16 1138   09/08/16 2200  vancomycin (VANCOCIN) IVPB 750 mg/150 ml premix  Status:  Discontinued     750 mg 150 mL/hr over 60 Minutes Intravenous Every 48 hours 09/07/16 0457 09/07/16 0917   09/07/16 2200  ceFEPIme (MAXIPIME) 1 g in dextrose 5 % 50 mL IVPB  Status:  Discontinued     1 g 100 mL/hr over 30 Minutes Intravenous Every 24 hours 09/07/16 0457 09/07/16 1404   09/07/16 1600  piperacillin-tazobactam (ZOSYN) IVPB 3.375 g     3.375 g 12.5 mL/hr over 240 Minutes Intravenous Every 8 hours 09/07/16 1421     09/07/16 1000  vancomycin (VANCOCIN) 500 mg in sodium chloride 0.9 % 100 mL IVPB  Status:  Discontinued     500 mg 100 mL/hr over  60 Minutes Intravenous Every 24 hours 09/07/16 0917 09/09/16 0726   09/07/16 0000  ceFEPIme (MAXIPIME) 2 g in dextrose 5 % 50 mL IVPB     2 g 100 mL/hr over 30 Minutes Intravenous  Once 09/06/16 2349 09/07/16 0155   09/07/16 0000  vancomycin (VANCOCIN) IVPB 1000 mg/200 mL premix     1,000 mg 200 mL/hr over 60 Minutes Intravenous  Once 09/06/16 2349 09/07/16 0155      Assessment/Plan:   Likely ileus.  NG output declining no bowel function yet.   Continue NG  Check belly films  Replace potassium   Hypokalemia, persistent.  This  can also contribute to ileus.  I have ordered 6 runs of KCl IV   Pneumonia.  On Zosyn/Vanc.    Leukocytosis probably due to pneumonia   NHL  Dr. Irene Limbo -  Acute encephalopathy.  Somewhat better  Severe protein calorie malnutrition  Hypomagnesemia.  Being treated  Atrial fibrillation with RVR.  Rate controlled.  Per medicine Anemia.  Stable.    LOS: 3 days    Christina Peterson 09/10/2016

## 2016-09-10 NOTE — Progress Notes (Signed)
PROGRESS NOTE    Christina Peterson  CWC:[id number] DOB: 09/15/1940 DOA: 09/06/2016 PCP: Julius Bowels, MD (Inactive)   Brief Narrative: Christina Peterson is a 76 y.o. female with medical history significant of HTN, HLD, severe mitral regurgitation, newly diagnosed non-Hodgkin's lymphoma; who presents with complaints of generalized weakness. Patient was last admitted to the hospital from 5/9 - 5/15 for hypovolemic hyponatremia, abdominal pain, and generalized weakness. Patient was discharged home, but since that time has not been doing well. Therefore that the patient has not had a bowel movement in 10 days. Family notes the patient was taking MiraLAX and trying other remedies such as prune juice without relief of symptoms. For the last 4 days she has had persistent nausea and vomiting unable to keep any food, liquid, or medications down. Furthermore, they note patient complaining of having worsening abdominal pain that she describes as crampy and distention. She had received 1 chemotherapy treatment on 5/14 prior to being discharged. Family denies any significant fevers, chest pain, hematemesis, dysuria, or diarrhea. The patient's daughter wonders if the patient can receive any more assistance at home.  ED Course: Upo n admission into the emergency department the patient was found to have WBC 14.4, sodium 123, chloride 74, BUN 35, creatinine 2.77, and  lactic acid of 5.63. CT imaging shows signs of possible multifocal pneumonia and fluid distention within the esophagus, stomach, and small bowel. Severe sepsis protocol was initiated and patient was placed on broad spectrum antibiotics of Vancomycin and cefepime given recent hospitalization. General surgery was consulted given concern for possible ileus.  Patient admitted with sepsis secondary to PNA. Also with hyponatremia, renal failure and Ileus vs SBO.  Patient was started on IV fluids. She had NG tube placed that yield 1.4 L fluid. Surgery was  consulted and has been helping with patient care.    Assessment & Plan:   Principal Problem:   Sepsis ([place]) Active Problems:   Hyponatremia   Normocytic normochromic anemia   Constipation   Severe protein-calorie malnutrition (HCC)   Diffuse large B-cell lymphoma of lymph nodes of multiple regions (HCC)   Nausea and vomiting   Ileus (HCC)   1-Sepsis;  Secondary to PNA vs intra-abdominal process.  CT ; diffuse nodular opacities B/L lungs.  WBC 14--15-17-17 received neulasta 5-16 Lactic acid trending down.  Follow blood culture no growth in 48 hours.  Continue with Cefepime 3.  Vancomycin stopped 5-28. MRSA PCR negative    2-Hyponatremia; related to hypovolemia.  Improving with IV fluids.   3-Ileus vs SBO.; -Surgery following. Considering ileus./  -IV fluids.  -NG tube in place. She had 1.4 L from NG tube on insertion. No further output.  -KUB; no evidence of Bowel obstruction.  -will try enema.    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 -Patient last received received chemotherapy, (R mini CHOP) plus tumor lysis syndrome prophylaxis with allopurinol on 5/14. -Follows with Dr Irene Limbo. Added to rounding team.   5-AKI; Secondary to hypovolemia, infection.  Improving with IV fluids.  Cr peak to 2.7--1.7.---1.1 Stable.   Acute encephalopathy; confused, lethargic.  More alert.   Hypokalemia; replete with IV kcl.    Severe protein calorie malnutrition Resume diet when able to tolerate.   Hypomagnesemia; IV mag ordered.   Anemia; follow trend.   A fib RVR;  Rate controlled.  IV PRN metoprolol.  Suspect related to acute illness.  HR improved with IV fluids./   DVT prophylaxis: Heparin  Code Status: Full Code.  Family Communication: daughter at bedside.  Disposition Plan: to be determine   Consultants:   Surgery    Procedures:  NG tube placement    Antimicrobials:     Vancomycin 5-25  Cefepime 5-25   Subjective: Sitting recliner. Abdominal pain still present. Passing gas.  No BM.  Report nausea.    Objective: Vitals:   09/10/16 0000 09/10/16 0301 09/10/16 0400 09/10/16 0700  BP: (!) 142/73  138/71   Pulse:      Resp: 20  (!) 26   Temp:  98.2 F (36.8 C)  98.4 F (36.9 C)  TempSrc:  Oral  Oral  SpO2: (!) 88%  97%   Weight:      Height:        Intake/Output Summary (Last 24 hours) at 09/10/16 0732 Last data filed at 09/10/16 0400  Gross per 24 hour  Intake             3960 ml  Output             1775 ml  Net             2185 ml   Filed Weights   09/07/16 0149 09/07/16 1600 09/08/16 1154  Weight: 53 kg (116 lb 13.5 oz) 52.5 kg (115 lb 11.9 oz) 53.4 kg (117 lb 11.2 oz)    Examination:  General exam: NAD, NG tube in place.  Respiratory system:  Bilateral air movement.  Cardiovascular system: S1 & S2 heard, RRR. No JVD, murmurs, rubs, gallops or clicks. No pedal edema. Gastrointestinal system; soft, mild tenderness, no rigidity  Central nervous system: Alert and oriented. No focal neurological deficits. Extremities: moves extremities passively Skin: No rash.      Data Reviewed: I have personally reviewed following labs and imaging studies  CBC:  Recent Labs Lab 09/06/16 2255 09/07/16 0641 09/08/16 0617 09/09/16 0300 09/10/16 0452  WBC 14.4* 14.9* 15.0* 17.9* 17.7*  NEUTROABS 13.8*  --   --   --   --   HGB 10.5* 11.6* 9.6* 9.5* 9.7*  HCT 31.8* 34.9* 28.4* 28.6* 31.1*  MCV 84.1 85.3 84.8 86.7 86.9  PLT 150 149* 147* 183 321   Basic Metabolic Panel:  Recent Labs Lab 09/07/16 0641 09/08/16 0617 09/08/16 1833 09/09/16 0252 09/09/16 0300 09/10/16 0452  NA 126* 131* 132*  --  134* 133*  K 3.8 2.5* 3.1*  --  3.1* 3.1*  CL 87* 94* 94*  --  95* 94*  CO2 29 30 31   --  34* 35*  GLUCOSE 117* 132* 137*  --  131* 120*  BUN 32* 25* 18  --  13 6  CREATININE 1.74* 1.17* 0.98  --  0.89 0.69  CALCIUM 8.7* 8.4* 8.3*   --  8.4* 8.3*  MG 1.4* 1.9  --  1.5*  --  1.3*  PHOS 4.4  --   --   --   --   --    GFR: Estimated Creatinine Clearance: 49.6 mL/min (by C-G formula based on SCr of 0.69 mg/dL). Liver Function Tests:  Recent Labs Lab 09/06/16 2140 09/07/16 0641  AST 26 21  ALT 39 27  ALKPHOS 154* 100  BILITOT 1.8* 1.7*  PROT 6.9 5.4*  ALBUMIN 3.8 2.9*    Recent Labs Lab 09/06/16 2140  LIPASE 19   No results for input(s): AMMONIA in the last 168 hours. Coagulation Profile: No results for input(s): INR, PROTIME in the last 168 hours. Cardiac Enzymes:  No results for input(s): CKTOTAL, CKMB, CKMBINDEX, TROPONINI in the last 168 hours. BNP (last 3 results) No results for input(s): PROBNP in the last 8760 hours. HbA1C: No results for input(s): HGBA1C in the last 72 hours. CBG:  Recent Labs Lab 09/07/16 1225  GLUCAP 101*   Lipid Profile: No results for input(s): CHOL, HDL, LDLCALC, TRIG, CHOLHDL, LDLDIRECT in the last 72 hours. Thyroid Function Tests: No results for input(s): TSH, T4TOTAL, FREET4, T3FREE, THYROIDAB in the last 72 hours. Anemia Panel: No results for input(s): VITAMINB12, FOLATE, FERRITIN, TIBC, IRON, RETICCTPCT in the last 72 hours. Sepsis Labs:  Recent Labs Lab 09/06/16 2156 09/07/16 0008 09/07/16 1726 09/07/16 2133  LATICACIDVEN 5.63* 1.27 1.1 0.9    Recent Results (from the past 240 hour(s))  Blood Culture (routine x 2)     Status: None (Preliminary result)   Collection Time: 09/06/16 11:01 PM  Result Value Ref Range Status   Specimen Description BLOOD RIGHT ANTECUBITAL  Final   Special Requests IN PEDIATRIC BOTTLE Blood Culture adequate volume  Final   Culture   Final    NO GROWTH 2 DAYS Performed at [facility], [place] [address] [place], [place] [address]    Report Status PENDING  Incomplete  Blood Culture (routine x 2)     Status: None (Preliminary result)   Collection Time: 09/06/16 11:07 PM  Result Value Ref Range Status   Specimen  Description BLOOD PORT  Final   Special Requests   Final    BOTTLES DRAWN AEROBIC AND ANAEROBIC Blood Culture adequate volume   Culture   Final    NO GROWTH 2 DAYS Performed at [facility], [place] [address] [place], [place] [address]    Report Status PENDING  Incomplete  MRSA PCR Screening     Status: None   Collection Time: 09/07/16  4:00 PM  Result Value Ref Range Status   MRSA by PCR NEGATIVE NEGATIVE Final    Comment:        The GeneXpert MRSA Assay (FDA approved for NASAL specimens only), is one component of a comprehensive MRSA colonization surveillance program. It is not intended to diagnose MRSA infection nor to guide or monitor treatment for MRSA infections.          Radiology Studies: Dg Abd 2 Views  Result Date: 09/08/2016 CLINICAL DATA:  Lethargy, abdominal pain, distension EXAM: ABDOMEN - 2 VIEW COMPARISON:  09/07/2016 FINDINGS: Nasogastric tube with the tip projecting over the antrum of the stomach. There is no bowel dilatation to suggest obstruction. There is no evidence of pneumoperitoneum, portal venous gas or pneumatosis. There are no pathologic calcifications along the expected course of the ureters. Total left hip arthroplasty without failure or complication. Moderate osteoarthritis of the right hip. No aggressive osseous lesion. IMPRESSION: 1. Nasogastric tube with the tip projecting over the antrum of the stomach. 2. No evidence of bowel obstruction. Electronically Signed   By: Kathreen Devoid   On: 09/08/2016 11:29        Scheduled Meds: . heparin  5,000 Units Subcutaneous Q8H  . pantoprazole (PROTONIX) IV  40 mg Intravenous Q12H   Continuous Infusions: . dextrose 5 % and 0.9 % NaCl with KCl 20 mEq/L 100 mL/hr at 09/10/16 0400  . magnesium sulfate 1 - 4 g bolus IVPB    . piperacillin-tazobactam (ZOSYN)  IV 3.375 g (09/10/16 0714)  . potassium chloride 10 mEq (09/10/16 0714)     LOS: 3 days    Time  spent: 35 minutes.     Elmarie Shiley, MD [facility] Pager [phone]  If 7PM-7AM, please contact night-coverage [email or web address] Password [email or web address] 09/10/2016, 7:32 AM

## 2016-09-10 NOTE — Evaluation (Signed)
Occupational Therapy Evaluation Patient Details Name: Christina Peterson MRN: [id number] DOB: 05/25/1940 Today's Date: 09/10/2016    History of Present Illness Christina Peterson is a 76 y.o. female with medical history significant of HTN, HLD, severe mitral regurgitation, newly diagnosed non-Hodgkin's lymphoma; who presents with complaints of generalized weakness. Patient was last admitted to the hospital from 5/9 - 5/15 for hypovolemic hyponatremia, abdominal pain, and generalized weakness. Patient was discharged home, but since that time has not been doing well., adm for sepsis/pna, ileus   Clinical Impression   Pt admitted with weakness. Pt currently with functional limitations due to the deficits listed below (see OT Problem List). Pt will benefit from skilled OT to increase their safety and independence with ADL and functional mobility for ADL to facilitate discharge to venue listed below.      Follow Up Recommendations  Home health OT;SNF;Supervision/Assistance - 24 hour    Equipment Recommendations  3 in 1 bedside commode       Precautions / Restrictions Precautions Precautions: Fall      Mobility Bed Mobility Overal bed mobility: Needs Assistance Bed Mobility: Supine to Sit     Supine to sit: Min assist     General bed mobility comments: light assist for trunk, partial roll, use of bed rail  Transfers Overall transfer level: Needs assistance Equipment used: None;Rolling walker (2 wheeled) Transfers: Sit to/from [facility] Sit to Stand: Min assist Stand pivot transfers: Min assist       General transfer comment: multi-modal cues for  hand placement and safe techniques        ADL either performed or assessed with clinical judgement   ADL Overall ADL's : Needs assistance/impaired Eating/Feeding: Set up;Sitting   Grooming: Minimal assistance;Sitting   Upper Body Bathing: Minimal assistance;Sitting   Lower Body Bathing: Maximal assistance;Sit  to/from stand;Cueing for safety;Cueing for sequencing   Upper Body Dressing : Minimal assistance;Sitting   Lower Body Dressing: Maximal assistance;Sit to/from stand;Cueing for safety;Cueing for sequencing;Cueing for compensatory techniques   Toilet Transfer: Moderate assistance;BSC   Toileting- Clothing Manipulation and Hygiene: Moderate assistance;Sit to/from stand;Cueing for safety;Cueing for sequencing         General ADL Comments: pt agreed to get to [facility] as well as to the chair     Vision Patient Visual Report: No change from baseline       Perception     Praxis      Pertinent Vitals/Pain Faces Pain Scale: Hurts a little bit Pain Location: does not specify Pain Descriptors / Indicators: Grimacing        Extremity/Trunk Assessment Upper Extremity Assessment Upper Extremity Assessment: Generalized weakness           Communication Communication Communication: No difficulties   Cognition Arousal/Alertness: Awake/alert Behavior During Therapy: Flat affect Overall Cognitive Status: Difficult to assess                                 General Comments: pt hard to understand   General Comments       Exercises     Shoulder Instructions      Home Living Family/patient expects to be discharged to:: Private residence Living Arrangements: Alone Available Help at Discharge: Home health (aide and RN 1x per wk (per family)) Type of Home: Apartment Home Access: Elevator     Home Layout: One level  Home Equipment: Latina Craver - 4 wheels   Additional Comments: family reports they have been helping pt more since last admission to hospital a few weeks ago      Prior Functioning/Environment Level of Independence: Independent with assistive device(s)        Comments: amb with cane or RW at times more recently        OT Problem List: Decreased strength;Decreased activity tolerance;Decreased safety awareness;Decreased  knowledge of use of DME or AE      OT Treatment/Interventions: Self-care/ADL training;Patient/family education;DME and/or AE instruction    OT Goals(Current goals can be found in the care plan section)    OT Frequency: Min 2X/week              AM-PAC PT "6 Clicks" Daily Activity     Outcome Measure   Help from another person taking care of personal grooming?: A Little Help from another person toileting, which includes using toliet, bedpan, or urinal?: A Lot Help from another person bathing (including washing, rinsing, drying)?: A Lot Help from another person to put on and taking off regular upper body clothing?: A Little Help from another person to put on and taking off regular lower body clothing?: A Lot 6 Click Score: 12   End of Session Equipment Utilized During Treatment: Rolling walker Nurse Communication: Mobility status  Activity Tolerance: Patient limited by fatigue Patient left: in chair  OT Visit Diagnosis: Unsteadiness on feet (R26.81);Muscle weakness (generalized) (M62.81)                Time: 8563-1497 OT Time Calculation (min): 15 min Charges:  OT General Charges $OT Visit: 1 Procedure OT Evaluation $OT Eval Moderate Complexity: 1 Procedure G-Codes:     Kari Baars, [place]  Payton Mccallum D 09/10/2016, 1:40 PM

## 2016-09-11 ENCOUNTER — Encounter: Payer: Medicare Other | Admitting: Nutrition

## 2016-09-11 ENCOUNTER — Encounter: Payer: Self-pay | Admitting: Pharmacist

## 2016-09-11 DIAGNOSIS — R5383 Other fatigue: Secondary | ICD-10-CM

## 2016-09-11 DIAGNOSIS — N289 Disorder of kidney and ureter, unspecified: Secondary | ICD-10-CM

## 2016-09-11 DIAGNOSIS — R627 Adult failure to thrive: Secondary | ICD-10-CM

## 2016-09-11 DIAGNOSIS — K567 Ileus, unspecified: Secondary | ICD-10-CM

## 2016-09-11 DIAGNOSIS — E46 Unspecified protein-calorie malnutrition: Secondary | ICD-10-CM

## 2016-09-11 DIAGNOSIS — E43 Unspecified severe protein-calorie malnutrition: Secondary | ICD-10-CM

## 2016-09-11 DIAGNOSIS — I340 Nonrheumatic mitral (valve) insufficiency: Secondary | ICD-10-CM

## 2016-09-11 LAB — BASIC METABOLIC PANEL
Anion gap: 5 (ref 5–15)
BUN: 6 mg/dL (ref 6–20)
CHLORIDE: 97 mmol/L — AB (ref 101–111)
CO2: 31 mmol/L (ref 22–32)
Calcium: 8.5 mg/dL — ABNORMAL LOW (ref 8.9–10.3)
Creatinine, Ser: 0.68 mg/dL (ref 0.44–1.00)
GFR calc Af Amer: >60 mL/min (ref >60)
GFR calc non Af Amer: >60 mL/min (ref >60)
Glucose, Bld: 118 mg/dL — ABNORMAL HIGH (ref 65–99)
POTASSIUM: 3.9 mmol/L (ref 3.5–5.1)
SODIUM: 133 mmol/L — AB (ref 135–145)

## 2016-09-11 LAB — CBC
HCT: 30.4 % — ABNORMAL LOW (ref 36.0–46.0)
HEMOGLOBIN: 9.9 g/dL — AB (ref 12.0–15.0)
MCH: 28 pg (ref 26.0–34.0)
MCHC: 32.6 g/dL (ref 30.0–36.0)
MCV: 86.1 fL (ref 78.0–100.0)
Platelets: 225 10*3/uL (ref 150–400)
RBC: 3.53 MIL/uL — AB (ref 3.87–5.11)
RDW: 15.2 % (ref 11.5–15.5)
WBC: 19.7 10*3/uL — ABNORMAL HIGH (ref 4.0–10.5)

## 2016-09-11 LAB — MAGNESIUM: MAGNESIUM: 1.4 mg/dL — AB (ref 1.7–2.4)

## 2016-09-11 MED ORDER — OXYCODONE HCL 5 MG PO TABS
5.0000 mg | ORAL_TABLET | ORAL | Status: DC | PRN
  Administered 2016-09-11: 5 mg via ORAL
  Administered 2016-09-12: 10 mg via ORAL
  Filled 2016-09-11: qty 1
  Filled 2016-09-11 (×2): qty 2

## 2016-09-11 MED ORDER — BOOST / RESOURCE BREEZE PO LIQD
1.0000 | Freq: Three times a day (TID) | ORAL | Status: DC
  Administered 2016-09-11 (×3): 1 via ORAL

## 2016-09-11 MED ORDER — POLYETHYLENE GLYCOL 3350 17 G PO PACK: 17.0000 g | PACK | Freq: Every day | ORAL | Status: DC

## 2016-09-11 MED ORDER — ENSURE ENLIVE PO LIQD
237.0000 mL | Freq: Two times a day (BID) | ORAL | Status: DC
  Administered 2016-09-11: 237 mL via ORAL

## 2016-09-11 MED ORDER — ALLOPURINOL 100 MG PO TABS
100.0000 mg | ORAL_TABLET | Freq: Two times a day (BID) | ORAL | Status: DC
  Administered 2016-09-11 – 2016-09-12 (×2): 100 mg via ORAL
  Filled 2016-09-11 (×2): qty 1

## 2016-09-11 MED ORDER — SUCRALFATE 1 G PO TABS
1.0000 g | ORAL_TABLET | Freq: Four times a day (QID) | ORAL | Status: DC
  Administered 2016-09-11 – 2016-09-12 (×3): 1 g via ORAL
  Filled 2016-09-11 (×3): qty 1

## 2016-09-11 NOTE — Progress Notes (Signed)
[name]   HEMATOLOGY/ONCOLOGY INPATIENT PROGRESS NOTE  Date of Service: 09/11/2016  Inpatient Attending: .Debbe Odea, MD  SUBJECTIVE  Christina Peterson was readmitted to hospital with generalized weakness and noted to have nausea vomiting and abdominal discomfort. Imaging concerning for ileus that appears to have resolved after conservative management. Has been evaluated by surgery. Also was dehydrated and hyponatremic and hypokalemic. Electrolyte issues are being addressed by hospitalist. Notes poor by mouth intake. Currently on a liquid diet. Concern for aspiration pneumonia on antibiotics. Was previously also admitted from 11 05/20/2013 for hyponatremia and dehydration as well as weakness.  I had a detailed discussion with her and her family regarding the importance of being able to maintain her nutrition and physical performance status in order to benefit from any additional treatment. We discussed that she'll probably will need a swallow evaluation and consideration for cycle nursing facility. I also tried to engage the patient in a frank discussion regarding her goals of care along with support from her family.  Patient is in favor of dealing her next cycle of chemotherapy by one week to try to reestablish oral intake and improve her functional status.    OBJECTIVE:  NAD  PHYSICAL EXAMINATION: . Vitals:   09/11/16 1130 09/11/16 1200 09/11/16 1559 09/11/16 1600  BP:  139/80  (!) 161/82  Pulse:      Resp:  (!) 25  18  Temp: 98.1 F (36.7 C)  98 F (36.7 C)   TempSrc: Oral  Oral   SpO2:  98%  98%  Weight:      Height:       Filed Weights   09/07/16 1600 09/08/16 1154 09/11/16 0522  Weight: 115 lb 11.9 oz (52.5 kg) 117 lb 11.2 oz (53.4 kg) 117 lb 8.1 oz (53.3 kg)   .Body mass index is 19.55 kg/m.  GENERAL:alert, Frail and somewhat cachectic appearing. SKIN: skin color, texture, turgor are normal, no rashes or significant lesions EYES: normal, conjunctiva are pink and  non-injected, sclera clear OROPHARYNX:no exudate, no erythema and lips, buccal mucosa, and tongue normal  NECK: supple, +JVD, LYMPH:  no palpable lymphadenopathy in the cervical, axillary or inguinal LUNGS: Bilateral few scattered rales in the bases . HEART: regular rate & rhythm, no murmurs and trace b/l pedal edema 3/6 SM ABDOMEN: abdomen soft, minimal tenderness to palpation, hypoactive bowel sounds. Musculoskeletal: no cyanosis of digits and no clubbing  PSYCH: alert & oriented x 3, depressed affect NEURO: no focal motor/sensory deficits  MEDICAL HISTORY:  Past Medical History:  Diagnosis Date  . Arthritis   . Hypercholesteremia   . Hypertension    FOLLOWED BY DR Katherine Roan    SURGICAL HISTORY: Past Surgical History:  Procedure Laterality Date  . ABDOMINAL HYSTERECTOMY    . IR FLUORO GUIDE PORT INSERTION RIGHT  08/23/2016  . IR US GUIDE VASC ACCESS RIGHT  08/23/2016  . JOINT REPLACEMENT    . 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 TOTAL HIP REVISION  05/02/2011   Procedure: TOTAL HIP REVISION;  Surgeon: Sharmon Revere, MD;  Location: [facility];  Service: Orthopedics;  Laterality: Left;  left total hip revision with bone grafting    SOCIAL HISTORY: Social History   Social History  . Marital status: Legally Separated    Spouse name: N/A  . Number of children: N/A  . Years of education: N/A   Occupational History  . Not on file.   Social History Main Topics  . Smoking status: Never  Smoker  . Smokeless tobacco: Never Used  . Alcohol use No  . Drug use: No  . Sexual activity: Not on file   Other Topics Concern  . Not on file   Social History Narrative  . No narrative on file    FAMILY HISTORY: History reviewed. No pertinent family history.  ALLERGIES:  has No Known Allergies.  MEDICATIONS:  Scheduled Meds: . allopurinol  100 mg Oral BID  . Chlorhexidine Gluconate Cloth  6 each Topical Q0600  . feeding supplement  1 Container Oral TID BM  . heparin  5,000  Units Subcutaneous Q8H  . pantoprazole (PROTONIX) IV  40 mg Intravenous Q12H  . polyethylene glycol  17 g Oral Daily  . sucralfate  1 g Oral QID   Continuous Infusions: . dextrose 5 % and 0.9 % NaCl with KCl 20 mEq/L 75 mL/hr at 09/11/16 1509  . piperacillin-tazobactam (ZOSYN)  IV 3.375 g (09/11/16 1622)   PRN Meds:.mouth rinse, metoprolol tartrate, morphine injection, ondansetron (ZOFRAN) IV, oxyCODONE, prochlorperazine  REVIEW OF SYSTEMS:    10 Point review of Systems was done is negative except as noted above.   LABORATORY DATA:  I have reviewed the data as listed  . CBC Latest Ref Rng & Units 09/12/2016 09/11/2016 09/10/2016  WBC 4.0 - 10.5 K/uL 20.9(H) 19.7(H) 17.7(H)  Hemoglobin 12.0 - 15.0 g/dL 9.6(L) 9.9(L) 9.7(L)  Hematocrit 36.0 - 46.0 % 29.4(L) 30.4(L) 31.1(L)  Platelets 150 - 400 K/uL 287 225 221    . CMP Latest Ref Rng & Units 09/12/2016 09/11/2016 09/10/2016  Glucose 65 - 99 mg/dL 103(H) 118(H) 120(H)  BUN 6 - 20 mg/dL 6 6 6   Creatinine 0.44 - 1.00 mg/dL 0.76 0.68 0.69  Sodium 135 - 145 mmol/L 134(L) 133(L) 133(L)  Potassium 3.5 - 5.1 mmol/L 3.4(L) 3.9 3.1(L)  Chloride 101 - 111 mmol/L 98(L) 97(L) 94(L)  CO2 22 - 32 mmol/L 29 31 35(H)  Calcium 8.9 - 10.3 mg/dL 8.4(L) 8.5(L) 8.3(L)  Total Protein 6.5 - 8.1 g/dL - - -  Total Bilirubin 0.3 - 1.2 mg/dL - - -  Alkaline Phos 38 - 126 U/L - - -  AST 15 - 41 U/L - - -  ALT 14 - 54 U/L - - -     RADIOGRAPHIC STUDIES: I have personally reviewed the radiological images as listed and agreed with the findings in the report. Ct Abdomen Pelvis Wo Contrast  Result Date: 09/06/2016 CLINICAL DATA:  Generalize weakness. Abdominal pain. 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 Weakness, nausea, and vomiting since chemotherapy. EXAM: CT ABDOMEN AND PELVIS WITHOUT CONTRAST TECHNIQUE: Multidetector CT imaging of the abdomen and pelvis was performed following the standard protocol without IV contrast. COMPARISON:  08/12/2016  FINDINGS: Lower chest: Diffuse nodular consolidation throughout both lung bases. This is nonspecific in could be due to multifocal pneumonia, aspiration, or lymphoma. Cardiac enlargement. Mildly distended fluid-filled esophagus suggesting reflux or dysmotility. Hepatobiliary: No focal liver lesions identified. Gallbladder is unremarkable. Bile ducts are mildly dilated, likely due to extrinsic compression. Pancreas: Pancreas appears grossly unremarkable. Large lymph node adjacent to the pancreatic head measures 4.2 cm in diameter. This was better seen on the previous study with contrast. The lesion appears unchanged in size. Spleen: Spleen size is normal.  No focal lesions identified. Adrenals/Urinary Tract: Adrenal glands are unremarkable. Kidneys are normal, without renal calculi, focal lesion, or hydronephrosis. Bladder is unremarkable. Stomach/Bowel: The stomach is diffusely distended with fluid. No gastric wall thickening. Duodenum and proximal small bowel  are mildly distended and filled with fluid and gas. No definite wall thickening. The distal small bowel is relatively decompressed. The colon is also mildly distended. Prominent stool in the cecum. Small bowel obstruction is not excluded the changes could be due to decreased motility is well. Vascular/Lymphatic: Calcification of the abdominal aorta. No aneurysm. Prominent celiac axis, mesenteric, and retroperitoneal lymphadenopathy is better depicted on previous contrast-enhanced scan but appears to be relatively unchanged. Reproductive: Status post hysterectomy. No adnexal masses. Other: No free air or free fluid in the abdomen. Abdominal wall musculature appears intact. Musculoskeletal: Postoperative left hip arthroplasty. Degenerative changes throughout the spine. Multiple focal areas of vague lucency demonstrated in the bones probably representing metastatic disease. IMPRESSION: Diffuse nodular consolidation throughout both lung bases could indicate  multifocal pneumonia, aspiration, or lymphoma involvement. This is new since previous study. Abdominal and retroperitoneal lymphadenopathy appears similar to prior study. Comparison is difficult due to lack of IV contrast material. Probable lymphoma involvement of the bones. Fluid distention of the esophagus, stomach, proximal small bowel, and colon. Obstruction is not excluded but changes could be due to decreased motility. Electronically Signed   By: Lucienne Capers M.D.   On: 09/06/2016 23:41   Dg Abd 1 View  Result Date: 09/07/2016 CLINICAL DATA:  Post NG tube placement EXAM: ABDOMEN - 1 VIEW COMPARISON:  Earlier same day FINDINGS: Enteric tube tip and side port projected the expected location of the gastric antrum. Worsening gas distention of multiple loops of large and small bowel with index loop of small bowel measuring approximately 2.5 cm in diameter. No supine evidence of pneumoperitoneum. No definite pneumatosis or portal venous gas. Multiple phleboliths overlie the lower pelvis bilaterally, left greater than right. Limited visualization of lower thorax demonstrates air bronchograms within the image right lower lung. Post left total hip replacement. Moderate degenerative change the right hip with joint space loss, subchondral sclerosis and osteophytosis. IMPRESSION: 1. Enteric tube tip and side port project over the expected location of the gastric antrum. 2. Findings suggestive of worsening ileus. 3. Findings worrisome for right lower lung pneumonia. Further evaluation dedicated chest radiograph could be performed as indicated. Electronically Signed   By: Sandi Mariscal M.D.   On: 09/07/2016 13:32   Dg Abd 1 View  Result Date: 09/07/2016 CLINICAL DATA:  Weakness.  Nausea and vomiting. EXAM: ABDOMEN - 1 VIEW COMPARISON:  CT scan Sep 06, 2016 FINDINGS: Increased density in the upper abdomen is consistent with the fluid-filled distended stomach seen on yesterday's CT scan. Air-filled loops of large  and small bowel may represent ileus. No other acute abnormalities. IMPRESSION: 1. Increased density in the upper abdomen consistent with the distended fluid-filled stomach seen on yesterday's CT scan. 2. Air-filled mildly prominent loops of large and small bowel may represent ileus. Electronically Signed   By: Dorise Bullion III M.D   On: 09/07/2016 09:14   Mr Brain W Wo Contrast  Result Date: 08/22/2016 CLINICAL DATA:  Non-Hodgkin's lymphoma, abdominal pain, and weight loss. EXAM: MRI HEAD WITHOUT AND WITH CONTRAST TECHNIQUE: Multiplanar, multiecho pulse sequences of the brain and surrounding structures were obtained without and with intravenous contrast. CONTRAST:  61mL MULTIHANCE GADOBENATE DIMEGLUMINE 529 MG/ML IV SOLN COMPARISON:  None. FINDINGS: Brain: No evidence for acute infarction, hemorrhage, mass lesion, hydrocephalus, or extra-axial fluid. Mild atrophy. Mild subcortical and periventricular T2 and FLAIR hyperintensities, likely chronic microvascular ischemic change. Post infusion, no abnormal enhancement of the brain or meninges. Partial empty sella. Vascular: Normal flow voids. Skull and  upper cervical spine: Normal marrow signal. Cervical spondylosis. Sinuses/Orbits: Chronic sinus disease. No acute orbital findings or masses. Other: None. IMPRESSION: Mild atrophy and small vessel disease. No acute intracranial findings. No abnormal postcontrast enhancement or osseous changes to suggest lymphoma within or surrounding the visualized CNS. Electronically Signed   By: Staci Righter M.D.   On: 08/22/2016 18:47   Ir US Guide Vasc Access Right  Result Date: 08/23/2016 CLINICAL DATA:  Lymphoma, access for chemotherapy EXAM: RIGHT INTERNAL JUGULAR SINGLE LUMEN POWER PORT CATHETER INSERTION Date:  5/11/20185/02/2017 4:19 pm Radiologist:  M. Daryll Brod, MD Guidance:  Ultrasound and fluoroscopic MEDICATIONS: Ancef 2 g; The antibiotic was administered within an appropriate time interval prior to skin  puncture. ANESTHESIA/SEDATION: Versed 2.0 mg IV; Fentanyl 75 mcg IV; Moderate Sedation Time:  24 minutes The patient was continuously monitored during the procedure by the interventional radiology nurse under my direct supervision. FLUOROSCOPY TIME:  54 seconds (4 mGy) COMPLICATIONS: None immediate. CONTRAST:  None. PROCEDURE: Informed consent was obtained from the patient following explanation of the procedure, risks, benefits and alternatives. The patient understands, agrees and consents for the procedure. All questions were addressed. A time out was performed. Maximal barrier sterile technique utilized including caps, mask, sterile gowns, sterile gloves, large sterile drape, hand hygiene, and 2% chlorhexidine scrub. Under sterile conditions and local anesthesia, right internal jugular micropuncture venous access was performed. Access was performed with ultrasound. Images were obtained for documentation. A guide wire was inserted followed by a transitional dilator. This allowed insertion of a guide wire and catheter into the IVC. Measurements were obtained from the SVC / RA junction back to the right IJ venotomy site. In the right infraclavicular chest, a subcutaneous pocket was created over the second anterior rib. This was done under sterile conditions and local anesthesia. 1% lidocaine with epinephrine was utilized for this. A 2.5 cm incision was made in the skin. Blunt dissection was performed to create a subcutaneous pocket over the right pectoralis major muscle. The pocket was flushed with saline vigorously. There was adequate hemostasis. The port catheter was assembled and checked for leakage. The port catheter was secured in the pocket with two retention sutures. The tubing was tunneled subcutaneously to the right venotomy site and inserted into the SVC/RA junction through a valved peel-away sheath. Position was confirmed with fluoroscopy. Images were obtained for documentation. The patient tolerated the  procedure well. No immediate complications. Incisions were closed in a two layer fashion with 4 - 0 Vicryl suture. Dermabond was applied to the skin. The port catheter was accessed, blood was aspirated followed by saline and heparin flushes. Needle was removed. A dry sterile dressing was applied. IMPRESSION: Ultrasound and fluoroscopically guided right internal jugular single lumen power port catheter insertion. Tip in the SVC/RA junction. Catheter ready for use. Electronically Signed   By: Jerilynn Mages.  Shick M.D.   On: 08/23/2016 16:21   Ct Biopsy  Result Date: 08/14/2016 INDICATION: No known primary, now with indeterminate right-sided renal lesion and retroperitoneal lymphadenopathy. Please perform CT-guided biopsy for tissue diagnostic purposes. EXAM: CT-GUIDED BIOPSY OF INDETERMINATE RIGHT-SIDED RETROPERITONEAL LYMPHADENOPATHY. COMPARISON:  CT the chest, abdomen and pelvis - 07/16/2016 MEDICATIONS: None. ANESTHESIA/SEDATION: Fentanyl 100 mcg IV; Versed 2 mg IV Sedation time: 15 minutes; The patient was continuously monitored during the procedure by the interventional radiology nurse under my direct supervision. CONTRAST:  None. COMPLICATIONS: None immediate. PROCEDURE: Informed consent was obtained from the patient following an explanation of the procedure, risks, benefits and alternatives. A  time out was performed prior to the initiation of the procedure. The patient was positioned prone on the CT table and a limited CT was performed for procedural planning demonstrating unchanged size and appearance of the dominant right-sided retroperitoneal nodal conglomeration with dominant component measuring approximately 3.5 x 2.9 cm (image 25, series 3). The procedure was planned. The operative site was prepped and draped in the usual sterile fashion. Appropriate trajectory was confirmed with a 22 gauge spinal needle after the adjacent tissues were anesthetized with 1% Lidocaine with epinephrine. Under intermittent CT  guidance, a 17 gauge coaxial needle was advanced into the peripheral aspect of the mass. Appropriate positioning was confirmed and 5 core needle biopsy samples were obtained with an 18 gauge core needle biopsy device. The co-axial needle was removed and hemostasis was achieved with manual compression. A limited postprocedural CT was negative for hemorrhage or additional complication. A dressing was placed. The patient tolerated the procedure well without immediate postprocedural complication. IMPRESSION: Technically successful CT guided core needle biopsy of indeterminate right-sided retroperitoneal nodal conglomeration. Electronically Signed   By: Sandi Mariscal M.D.   On: 08/14/2016 13:50   Dg Chest Port 1 View  Result Date: 09/07/2016 CLINICAL DATA:  Hypoxia. EXAM: PORTABLE CHEST 1 VIEW COMPARISON:  Radiograph of Sep 06, 2016. FINDINGS: Stable cardiomegaly. No pneumothorax is noted. Nasogastric tube is seen entering stomach with distal tip in distal stomach. Right internal jugular Port-A-Cath is noted with distal tip in expected position of cavoatrial junction. Bilateral lower lobe opacities are noted, right greater than left, consistent with edema or pneumonia. Minimal bilateral pleural effusions cannot be excluded. Bony thorax is unremarkable. IMPRESSION: Stable bibasilar opacities are noted most consistent with pneumonia or possibly edema. Minimal bilateral pleural effusions may be present. Interval placement of nasogastric tube with distal tip in expected position of distal stomach. Electronically Signed   By: Marijo Conception, M.D.   On: 09/07/2016 14:12   Dg Chest Port 1 View  Result Date: 09/07/2016 CLINICAL DATA:  Acute onset of generalized weakness. Stage 3 lymphoma. Nausea and vomiting. Initial encounter. EXAM: PORTABLE CHEST 1 VIEW COMPARISON:  Chest radiograph performed 10/01/2015, and CT of the chest performed 08/12/2016 FINDINGS: Bibasilar airspace opacities raise concern for pneumonia. No  definite pleural effusion or pneumothorax is seen. The cardiomediastinal silhouette is mildly enlarged. No acute osseous abnormalities are identified. A right-sided chest port is noted ending about the distal SVC. IMPRESSION: 1. Bibasilar airspace opacities raise concern for pneumonia. 2. Mild cardiomegaly. Electronically Signed   By: Garald Balding M.D.   On: 09/07/2016 00:12   Dg Abd 2 Views  Result Date: 09/08/2016 CLINICAL DATA:  Lethargy, abdominal pain, distension EXAM: ABDOMEN - 2 VIEW COMPARISON:  09/07/2016 FINDINGS: Nasogastric tube with the tip projecting over the antrum of the stomach. There is no bowel dilatation to suggest obstruction. There is no evidence of pneumoperitoneum, portal venous gas or pneumatosis. There are no pathologic calcifications along the expected course of the ureters. Total left hip arthroplasty without failure or complication. Moderate osteoarthritis of the right hip. No aggressive osseous lesion. IMPRESSION: 1. Nasogastric tube with the tip projecting over the antrum of the stomach. 2. No evidence of bowel obstruction. Electronically Signed   By: Kathreen Devoid   On: 09/08/2016 11:29   Dg Abd Portable 1v  Result Date: 09/10/2016 CLINICAL DATA:  Ileus EXAM: PORTABLE ABDOMEN - 1 VIEW COMPARISON:  09/08/2016 abdominal radiographs FINDINGS: Enteric tube terminates in the distal stomach. No dilated small bowel loops.  Mild colonic gas and stool. No evidence of pneumatosis or pneumoperitoneum. No radiopaque urolithiasis. Partially visualized left total hip arthroplasty. Patchy bibasilar lung opacities. IMPRESSION: 1. Enteric tube terminates in the distal stomach . 2. Nonobstructive bowel gas pattern. 3. Patchy bibasilar lung opacities, correlate with chest radiograph as clinically warranted. Electronically Signed   By: Ilona Sorrel M.D.   On: 09/10/2016 08:00   Ir Fluoro Guide Port Insertion Right  Result Date: 08/23/2016 CLINICAL DATA:  Lymphoma, access for chemotherapy  EXAM: RIGHT INTERNAL JUGULAR SINGLE LUMEN POWER PORT CATHETER INSERTION Date:  5/11/20185/02/2017 4:19 pm Radiologist:  M. Daryll Brod, MD Guidance:  Ultrasound and fluoroscopic MEDICATIONS: Ancef 2 g; The antibiotic was administered within an appropriate time interval prior to skin puncture. ANESTHESIA/SEDATION: Versed 2.0 mg IV; Fentanyl 75 mcg IV; Moderate Sedation Time:  24 minutes The patient was continuously monitored during the procedure by the interventional radiology nurse under my direct supervision. FLUOROSCOPY TIME:  54 seconds (4 mGy) COMPLICATIONS: None immediate. CONTRAST:  None. PROCEDURE: Informed consent was obtained from the patient following explanation of the procedure, risks, benefits and alternatives. The patient understands, agrees and consents for the procedure. All questions were addressed. A time out was performed. Maximal barrier sterile technique utilized including caps, mask, sterile gowns, sterile gloves, large sterile drape, hand hygiene, and 2% chlorhexidine scrub. Under sterile conditions and local anesthesia, right internal jugular micropuncture venous access was performed. Access was performed with ultrasound. Images were obtained for documentation. A guide wire was inserted followed by a transitional dilator. This allowed insertion of a guide wire and catheter into the IVC. Measurements were obtained from the SVC / RA junction back to the right IJ venotomy site. In the right infraclavicular chest, a subcutaneous pocket was created over the second anterior rib. This was done under sterile conditions and local anesthesia. 1% lidocaine with epinephrine was utilized for this. A 2.5 cm incision was made in the skin. Blunt dissection was performed to create a subcutaneous pocket over the right pectoralis major muscle. The pocket was flushed with saline vigorously. There was adequate hemostasis. The port catheter was assembled and checked for leakage. The port catheter was secured in  the pocket with two retention sutures. The tubing was tunneled subcutaneously to the right venotomy site and inserted into the SVC/RA junction through a valved peel-away sheath. Position was confirmed with fluoroscopy. Images were obtained for documentation. The patient tolerated the procedure well. No immediate complications. Incisions were closed in a two layer fashion with 4 - 0 Vicryl suture. Dermabond was applied to the skin. The port catheter was accessed, blood was aspirated followed by saline and heparin flushes. Needle was removed. A dry sterile dressing was applied. IMPRESSION: Ultrasound and fluoroscopically guided right internal jugular single lumen power port catheter insertion. Tip in the SVC/RA junction. Catheter ready for use. Electronically Signed   By: Jerilynn Mages.  Shick M.D.   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 Patient has extensive retroperitoneal mesenteric and periportal lymphadenopathy along with adenopathy in the chest and supraclavicular areas and a focal lesion in the spleen.  Patient received first cycle of R-mini CHOP on 08/26/2016 and received outpatient neulasta. Patient notes some grade 1 fatigue and grade 1-2 anorexia. Plan -She was scheduled for PET/CT scan on 09/12/2016 would likely need to be rescheduled in the Hospital. -next cycle of  R-CHOP scheduled for 09/16/2016 with G-CSF support. Might need to delay by a week. The patient continues to remain fatigued with poor oral intake and recent aspiration pneumonia.  #2 Right lower pole of the kidney lesion ? Renal cell carcinoma versus involvement by lymphoma . Plan -Will hold off on biopsy at this time. -We'll reassess response of this lesion on repeat PET/CT scan after 3 cycles of treatment. If this resolves or shrinks would suggest diagnosis of lymphoma. If persistent or increased in size we  will need to decide on appropriate management for this.  #3 significant fatigue and failure to thrive due to poor oral intake related lymphoma and type B constitutional symptoms . #4 moderate to severe routine calorie malnutrition. #5 possible dysphagia with aspiration pneumonia. #6 Ileus- likely multifactorial pain medications plus chemotherapy plus lymphoma affecting retroperitoneum and mesentery plus electrolyte abnormalities. Now resolving. Plan -Nutritional consultation. -Swallow evaluation for aspiration risk and appropriate dietary modifications. -Antibiotics for aspiration pneumonia as per hospitalist team. -Optimizing pain management with attempted to minimize narcotics. -IV fluids and electrolyte replacement as appropriate. -Laxatives to address constipation - continue MiraLAX added senna S. -Dietitian consultation -Physical therapy and occupational therapy input. -Patient would likely need to have discharged to skilled nursing facility to optimize her nutritional status and performance status if she would like to continue to pursue definitive treatments for her lymphoma. -Ongoing goals of care discussion.  #7 severe mitral regurgitation normal ejection fraction -Outpatient cardiology follow-up.  We'll continue to follow while in the hospital.  I spent 30 minutes counseling the patient face to face and in discussion with the patient and family. The total time spent in the appointment was 40 minutes and more than 50% was on counseling and direct patient cares.    Sullivan Lone MD [place] AAHIVMS [facility] Hematology/Oncology Physician [facility]  (Office):       [phone] (Work cell):  [phone] (Fax):           [phone]  09/11/2016 7:06 PM

## 2016-09-11 NOTE — Progress Notes (Signed)
Subjective: Doing better.  NG tube out.  Passing stool and flatus.  Denies abdominal pain.  Denies nausea. Potassium up to 3.9.  Glucose 118.  WBC 19,700.  Objective: Vital signs in last 24 hours: Temp:  [97.4 F (36.3 C)-98.2 F (36.8 C)] 98 F (36.7 C) (05/30 0751) Resp:  [7-25] 7 (05/30 0428) BP: (145-160)/(74-86) 145/74 (05/30 0428) SpO2:  [95 %-99 %] 97 % (05/30 0428) Weight:  [53.3 kg (117 lb 8.1 oz)] 53.3 kg (117 lb 8.1 oz) (05/30 0522) Last BM Date: 09/10/16  Intake/Output from previous day: 05/29 0701 - 05/30 0700 In: 930 [I.V.:900; NG/GT:30] Out: 2550 [Urine:2050; Emesis/NG output:300; Stool:200] Intake/Output this shift: No intake/output data recorded.   General appearance: Alert.  Low BMI.  Deconditioned. Resp: Moving air reasonably well.  Occasional rhonchi bases. GI: Abdomen fairly soft.   nontender.  No mass.  No hernia.  Hypoactive bowel sounds.  No significant distention   Lab Results:   Recent Labs  09/10/16 0452 09/11/16 0158  WBC 17.7* 19.7*  HGB 9.7* 9.9*  HCT 31.1* 30.4*  PLT 221 225   BMET  Recent Labs  09/10/16 0452 09/11/16 0158  NA 133* 133*  K 3.1* 3.9  CL 94* 97*  CO2 35* 31  GLUCOSE 120* 118*  BUN 6 6  CREATININE 0.69 0.68  CALCIUM 8.3* 8.5*   PT/INR No results for input(s): LABPROT, INR in the last 72 hours. ABG No results for input(s): PHART, HCO3 in the last 72 hours.  Invalid input(s): PCO2, PO2  Studies/Results: Dg Abd Portable 1v  Result Date: 09/10/2016 CLINICAL DATA:  Ileus EXAM: PORTABLE ABDOMEN - 1 VIEW COMPARISON:  09/08/2016 abdominal radiographs FINDINGS: Enteric tube terminates in the distal stomach. No dilated small bowel loops. Mild colonic gas and stool. No evidence of pneumatosis or pneumoperitoneum. No radiopaque urolithiasis. Partially visualized left total hip arthroplasty. Patchy bibasilar lung opacities. IMPRESSION: 1. Enteric tube terminates in the distal stomach . 2. Nonobstructive bowel gas  pattern. 3. Patchy bibasilar lung opacities, correlate with chest radiograph as clinically warranted. Electronically Signed   By: Ilona Sorrel M.D.   On: 09/10/2016 08:00    Anti-infectives: Anti-infectives    Start     Dose/Rate Route Frequency Ordered Stop   09/09/16 1000  vancomycin (VANCOCIN) 500 mg in sodium chloride 0.9 % 100 mL IVPB  Status:  Discontinued     500 mg 100 mL/hr over 60 Minutes Intravenous Every 12 hours 09/09/16 0726 09/09/16 1138   09/08/16 2200  vancomycin (VANCOCIN) IVPB 750 mg/150 ml premix  Status:  Discontinued     750 mg 150 mL/hr over 60 Minutes Intravenous Every 48 hours 09/07/16 0457 09/07/16 0917   09/07/16 2200  ceFEPIme (MAXIPIME) 1 g in dextrose 5 % 50 mL IVPB  Status:  Discontinued     1 g 100 mL/hr over 30 Minutes Intravenous Every 24 hours 09/07/16 0457 09/07/16 1404   09/07/16 1600  piperacillin-tazobactam (ZOSYN) IVPB 3.375 g     3.375 g 12.5 mL/hr over 240 Minutes Intravenous Every 8 hours 09/07/16 1421     09/07/16 1000  vancomycin (VANCOCIN) 500 mg in sodium chloride 0.9 % 100 mL IVPB  Status:  Discontinued     500 mg 100 mL/hr over 60 Minutes Intravenous Every 24 hours 09/07/16 0917 09/09/16 0726   09/07/16 0000  ceFEPIme (MAXIPIME) 2 g in dextrose 5 % 50 mL IVPB     2 g 100 mL/hr over 30 Minutes Intravenous  Once 09/06/16 2349 09/07/16  0155   09/07/16 0000  vancomycin (VANCOCIN) IVPB 1000 mg/200 mL premix     1,000 mg 200 mL/hr over 60 Minutes Intravenous  Once 09/06/16 2349 09/07/16 0155      Assessment/Plan:  Likely ileus.  Likely multifactorial Resolving Start clear liquid diet Replace potassium   Hypokalemia,  improved  Pneumonia.  On Zosyn/Vanc.    Leukocytosis probably due to pneumonia   NHL  Dr. Irene Limbo -  Acute encephalopathy.  Somewhat better  Severe protein calorie malnutrition  Hypomagnesemia.  Being treated  Atrial fibrillation with RVR.  Rate controlled.  Per medicine Anemia.  Stable.   LOS: 4 days     Tyeesha Riker M 09/11/2016

## 2016-09-11 NOTE — Progress Notes (Signed)
CSW met with pt's daughter / granddaughter to assist with d/c planning. Daughter reports that pt will return home, with her / granddaughter at d/c. " My mother wants to go home. ". Pt / family are interested in [facility] services. RNCM will assist with d/c planning. CSW will remain available in case plan changes and [facility] is needed.  Werner Lean LCSW [phone]

## 2016-09-11 NOTE — Progress Notes (Addendum)
PROGRESS NOTE    ISABELA NARDELLI   YHC:[id number]  DOB: September 23, 1940  DOA: 09/06/2016 PCP: Julius Bowels, MD (Inactive)   Brief Narrative:  Chavon Lucarelli Smithis a 76 y.o.femalewith medical history significant of HTN, HLD, severe mitral regurgitation, newly diagnosed non-Hodgkin's lymphoma who presents with complaints of generalized weakness. She was last admitted to the hospital from 5/9 - 5/15 for hypovolemic hyponatremia, abdominal pain, and generalized weakness. She was discharged home but continued to have a poor appetite and was noted to be constipated. She presented with nausea, vomiting and abdominal pain. She was found to have multifocal pneumonia and ileus vs SBO on Ct scan. WBC count was 14.4, sodium, 123, Cr 74, BUN 35, Cr 2.77 and LA of 5.63. NG tube was placed yielding 1.4 L of fluid. Surgery team was consulted as well.   Subjective: No abdominal pain or nausea. She is having BMs. No cough or dyspnea.   Assessment & Plan:   Principal Problem:   Sepsis  - likely due to PNA (which is likely aspiration) - CT on admission   "Diffuse nodular consolidation throughout both lung bases could indicate multifocal pneumonia, aspiration, or lymphoma involvement." - currently on Zosyn - Vancomycin discontinued on 5/28 - WBC still rising but this may be secondary to Neulasta which was given on 5/16  Active Problems:   Hyponatremia - suspected to be hypovolemia and has improved with IVF - has had a poor oral intake at home per family which likely has contributed to hyponatremia - would recommend close f/u with lab work as outpt  Ileus vs SBO - likely ileus- possibly related to hyponatremia - has resolved, NG removed and patient placed on clear liquids - follow   Acute encephalopathy characterized by lethargy - improved- quite alert this AM    Hypokalemia - has been replaced- cont to follow    Normocytic normochromic anemia     Severe protein-calorie malnutrition  - have  spoken with patient and family about dietry supplements and have ordered them    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)  - per oncology, Dr Irene Limbo - lact chemo was 5/14   A fib RVR - likely related to acute illness and dehydration - ECHO on 5/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 - on IV PRN metoprolol and currently in NSR   - follow - will not start anticoagulation as of yet  Chronic pain - cont PRN Morphine- MS Contin on hold- can resume oxycodone which she takes PRN at home as well  HTN - Losartan on hold- (per med rec, she was not taking it at home)- follow   DVT prophylaxis: Heparin Code Status: Full code Family Communication: daughters Disposition Plan: home tomorrow if stable Consultants:   gen surgery Procedures:    Antimicrobials:  Anti-infectives    Start     Dose/Rate Route Frequency Ordered Stop   09/09/16 1000  vancomycin (VANCOCIN) 500 mg in sodium chloride 0.9 % 100 mL IVPB  Status:  Discontinued     500 mg 100 mL/hr over 60 Minutes Intravenous Every 12 hours 09/09/16 0726 09/09/16 1138   09/08/16 2200  vancomycin (VANCOCIN) IVPB 750 mg/150 ml premix  Status:  Discontinued     750 mg 150 mL/hr over 60 Minutes Intravenous Every 48 hours 09/07/16 0457 09/07/16 0917   09/07/16 2200  ceFEPIme (MAXIPIME) 1 g in dextrose 5 % 50 mL IVPB  Status:  Discontinued     1 g  100 mL/hr over 30 Minutes Intravenous Every 24 hours 09/07/16 0457 09/07/16 1404   09/07/16 1600  piperacillin-tazobactam (ZOSYN) IVPB 3.375 g     3.375 g 12.5 mL/hr over 240 Minutes Intravenous Every 8 hours 09/07/16 1421     09/07/16 1000  vancomycin (VANCOCIN) 500 mg in sodium chloride 0.9 % 100 mL IVPB  Status:  Discontinued     500 mg 100 mL/hr over 60 Minutes Intravenous Every 24 hours 09/07/16 0917 09/09/16 0726   09/07/16 0000  ceFEPIme (MAXIPIME) 2 g in dextrose 5 % 50 mL IVPB     2 g 100 mL/hr over 30 Minutes Intravenous  Once 09/06/16  2349 09/07/16 0155   09/07/16 0000  vancomycin (VANCOCIN) IVPB 1000 mg/200 mL premix     1,000 mg 200 mL/hr over 60 Minutes Intravenous  Once 09/06/16 2349 09/07/16 0155       Objective: Vitals:   09/11/16 0522 09/11/16 0751 09/11/16 0800 09/11/16 1130  BP:   (!) 158/75   Pulse:      Resp:   18   Temp:  98 F (36.7 C)  98.1 F (36.7 C)  TempSrc:  Oral  Oral  SpO2:   94%   Weight: 53.3 kg (117 lb 8.1 oz)     Height:        Intake/Output Summary (Last 24 hours) at 09/11/16 1328 Last data filed at 09/11/16 0900  Gross per 24 hour  Intake              900 ml  Output             1400 ml  Net             -500 ml   Filed Weights   09/07/16 1600 09/08/16 1154 09/11/16 0522  Weight: 52.5 kg (115 lb 11.9 oz) 53.4 kg (117 lb 11.2 oz) 53.3 kg (117 lb 8.1 oz)    Examination: General exam: Appears comfortable  HEENT: PERRLA, oral mucosa moist, no sclera icterus or thrush Respiratory system: Clear to auscultation. Respiratory effort normal. Cardiovascular system: S1 & S2 heard, RRR.  No murmurs  Gastrointestinal system: Abdomen soft, non-tender, nondistended. Normal bowel sound. No organomegaly Central nervous system: Alert and oriented. No focal neurological deficits. Extremities: No cyanosis, clubbing or edema Skin: No rashes or ulcers Psychiatry:  Flat affect     Data Reviewed: I have personally reviewed following labs and imaging studies  CBC:  Recent Labs Lab 09/06/16 2255 09/07/16 0641 09/08/16 0617 09/09/16 0300 09/10/16 0452 09/11/16 0158  WBC 14.4* 14.9* 15.0* 17.9* 17.7* 19.7*  NEUTROABS 13.8*  --   --   --   --   --   HGB 10.5* 11.6* 9.6* 9.5* 9.7* 9.9*  HCT 31.8* 34.9* 28.4* 28.6* 31.1* 30.4*  MCV 84.1 85.3 84.8 86.7 86.9 86.1  PLT 150 149* 147* 183 221 720   Basic Metabolic Panel:  Recent Labs Lab 09/07/16 0641 09/08/16 0617 09/08/16 1833 09/09/16 0252 09/09/16 0300 09/10/16 0452 09/11/16 0158  NA 126* 131* 132*  --  134* 133* 133*  K 3.8  2.5* 3.1*  --  3.1* 3.1* 3.9  CL 87* 94* 94*  --  95* 94* 97*  CO2 29 30 31   --  34* 35* 31  GLUCOSE 117* 132* 137*  --  131* 120* 118*  BUN 32* 25* 18  --  13 6 6   CREATININE 1.74* 1.17* 0.98  --  0.89 0.69 0.68  CALCIUM 8.7* 8.4* 8.3*  --  8.4* 8.3* 8.5*  MG 1.4* 1.9  --  1.5*  --  1.3* 1.4*  PHOS 4.4  --   --   --   --   --   --    GFR: Estimated Creatinine Clearance: 49.6 mL/min (by C-G formula based on SCr of 0.68 mg/dL). Liver Function Tests:  Recent Labs Lab 09/06/16 2140 09/07/16 0641  AST 26 21  ALT 39 27  ALKPHOS 154* 100  BILITOT 1.8* 1.7*  PROT 6.9 5.4*  ALBUMIN 3.8 2.9*    Recent Labs Lab 09/06/16 2140  LIPASE 19   No results for input(s): AMMONIA in the last 168 hours. Coagulation Profile: No results for input(s): INR, PROTIME in the last 168 hours. Cardiac Enzymes: No results for input(s): CKTOTAL, CKMB, CKMBINDEX, TROPONINI in the last 168 hours. BNP (last 3 results) No results for input(s): PROBNP in the last 8760 hours. HbA1C: No results for input(s): HGBA1C in the last 72 hours. CBG:  Recent Labs Lab 09/07/16 1225 09/10/16 1826  GLUCAP 101* 120*   Lipid Profile: No results for input(s): CHOL, HDL, LDLCALC, TRIG, CHOLHDL, LDLDIRECT in the last 72 hours. Thyroid Function Tests: No results for input(s): TSH, T4TOTAL, FREET4, T3FREE, THYROIDAB in the last 72 hours. Anemia Panel: No results for input(s): VITAMINB12, FOLATE, FERRITIN, TIBC, IRON, RETICCTPCT in the last 72 hours. Urine analysis:    Component Value Date/Time   COLORURINE YELLOW 08/21/2016 1700   APPEARANCEUR CLEAR 08/21/2016 1700   LABSPEC 1.012 08/21/2016 1700   PHURINE 6.0 08/21/2016 1700   GLUCOSEU NEGATIVE 08/21/2016 1700   HGBUR MODERATE (A) 08/21/2016 1700   BILIRUBINUR NEGATIVE 08/21/2016 1700   KETONESUR 5 (A) 08/21/2016 1700   PROTEINUR NEGATIVE 08/21/2016 1700   UROBILINOGEN 0.2 04/30/2011 1145   NITRITE NEGATIVE 08/21/2016 1700   LEUKOCYTESUR NEGATIVE  08/21/2016 1700   Sepsis Labs: @LABRCNTIP (procalcitonin:4,lacticidven:4) ) Recent Results (from the past 240 hour(s))  Blood Culture (routine x 2)     Status: None (Preliminary result)   Collection Time: 09/06/16 11:01 PM  Result Value Ref Range Status   Specimen Description BLOOD RIGHT ANTECUBITAL  Final   Special Requests IN PEDIATRIC BOTTLE Blood Culture adequate volume  Final   Culture   Final    NO GROWTH 3 DAYS Performed at [facility], [place] [address], [place], [place] [address]    Report Status PENDING  Incomplete  Blood Culture (routine x 2)     Status: None (Preliminary result)   Collection Time: 09/06/16 11:07 PM  Result Value Ref Range Status   Specimen Description BLOOD PORT  Final   Special Requests   Final    BOTTLES DRAWN AEROBIC AND ANAEROBIC Blood Culture adequate volume   Culture   Final    NO GROWTH 3 DAYS Performed at [facility] [address] [place], [place] [address]    Report Status PENDING  Incomplete  MRSA PCR Screening     Status: None   Collection Time: 09/07/16  4:00 PM  Result Value Ref Range Status   MRSA by PCR NEGATIVE NEGATIVE Final    Comment:        The GeneXpert MRSA Assay (FDA approved for NASAL specimens only), is one component of a comprehensive MRSA colonization surveillance program. It is not intended to diagnose MRSA infection nor to guide or monitor treatment for MRSA infections.          Radiology Studies: Dg Abd Portable 1v  Result Date: 09/10/2016 CLINICAL DATA:  Ileus EXAM: PORTABLE ABDOMEN - 1 VIEW COMPARISON:  09/08/2016 abdominal radiographs FINDINGS: Enteric tube terminates in the distal stomach. No dilated small bowel loops. Mild colonic gas and stool. No evidence of pneumatosis or pneumoperitoneum. No radiopaque urolithiasis. Partially visualized left total hip arthroplasty. Patchy bibasilar lung opacities. IMPRESSION: 1. Enteric tube terminates in the distal stomach . 2. Nonobstructive bowel gas  pattern. 3. Patchy bibasilar lung opacities, correlate with chest radiograph as clinically warranted. Electronically Signed   By: Ilona Sorrel M.D.   On: 09/10/2016 08:00      Scheduled Meds: . Chlorhexidine Gluconate Cloth  6 each Topical Q0600  . feeding supplement  1 Container Oral TID BM  . feeding supplement (ENSURE ENLIVE)  237 mL Oral BID BM  . heparin  5,000 Units Subcutaneous Q8H  . pantoprazole (PROTONIX) IV  40 mg Intravenous Q12H   Continuous Infusions: . dextrose 5 % and 0.9 % NaCl with KCl 20 mEq/L 75 mL/hr at 09/10/16 1418  . piperacillin-tazobactam (ZOSYN)  IV Stopped (09/11/16 1309)     LOS: 4 days    Time spent in minutes: 35    Debbe Odea, MD [facility] Pager: [email or web address] Password [email or web address] 09/11/2016, 1:28 PM

## 2016-09-11 NOTE — Progress Notes (Addendum)
Physical Therapy Treatment Patient Details Name: Christina Peterson MRN: [id number] DOB: 1940-10-19 Today's Date: 09/11/2016    History of Present Illness Christina Peterson is a 76 y.o. female with medical history significant of HTN, HLD, severe mitral regurgitation, newly diagnosed non-Hodgkin's lymphoma; who presents with complaints of generalized weakness. Patient was last admitted to the hospital from 5/9 - 5/15 for hypovolemic hyponatremia, abdominal pain, and generalized weakness. Patient was discharged home, but since that time has not been doing well., adm for sepsis/pna, ileus    PT Comments    Pt OOB in recliner eating breakfast with family in room.  BP 158/75(102), RA 98%, HR 73, RR 19 Assisted with amb 1/2 unit using B platform EVA walker for increased support/safety for first attempt gait.  Tolerated well. Min c/o weakness.  Vitals after BP 151/74997), RA 99%, HR 71, RR 16  Follow Up Recommendations  SNF or 24/7 family support with [facility]     Equipment Recommendations  None recommended by PT    Recommendations for Other Services       Precautions / Restrictions Precautions Precautions: Fall Restrictions Weight Bearing Restrictions: No    Mobility  Bed Mobility               General bed mobility comments: OOB in recliner  Transfers Overall transfer level: Needs assistance Equipment used: None;Rolling walker (2 wheeled) Transfers: Sit to/from [facility] Sit to Stand: Min assist;Min guard Stand pivot transfers: Min guard;Min assist       General transfer comment: 50% VC's on proper hand placement and safety with turn completion.  Also assisted on/off BSC  Ambulation/Gait Ambulation/Gait assistance: Min assist Ambulation Distance (Feet): 225 Feet Assistive device: Bilateral platform walker   Gait velocity: decreased   General Gait Details: used B platform EVA for increased support/safety attempted amb for first time.  RN assisted by following  with chair.  Tolerated well.  Mild weakness.     Stairs            Wheelchair Mobility    Modified Rankin (Stroke Patients Only)       Balance                                            Cognition Arousal/Alertness: Awake/alert Behavior During Therapy: WFL for tasks assessed/performed Overall Cognitive Status: Within Functional Limits for tasks assessed                                        Exercises      General Comments        Pertinent Vitals/Pain Pain Assessment: No/denies pain    Home Living                      Prior Function            PT Goals (current goals can now be found in the care plan section) Progress towards PT goals: Progressing toward goals    Frequency    Min 3X/week      PT Plan Current plan remains appropriate    Co-evaluation              AM-PAC PT "6 Clicks" Daily Activity  Outcome Measure  Difficulty turning over in bed (including adjusting  bedclothes, sheets and blankets)?: A Little Difficulty moving from lying on back to sitting on the side of the bed? : A Little Difficulty sitting down on and standing up from a chair with arms (e.g., wheelchair, bedside commode, etc,.)?: A Lot Help needed moving to and from a bed to chair (including a wheelchair)?: A Lot Help needed walking in hospital room?: A Lot Help needed climbing 3-5 steps with a railing? : A Lot 6 Click Score: 14    End of Session Equipment Utilized During Treatment: Gait belt Activity Tolerance: Patient tolerated treatment well Patient left: in chair;with call bell/phone within reach;with family/visitor present Nurse Communication: Mobility status (RN assisted by following with recliner) PT Visit Diagnosis: Other abnormalities of gait and mobility (R26.89);Muscle weakness (generalized) (M62.81)     Time: 0940-1010 PT Time Calculation (min) (ACUTE ONLY): 30 min  Charges:  $Gait Training: 8-22  mins $Therapeutic Activity: 8-22 mins                    G Codes:       {Allsion [place]  PTA [facility]  Acute  Rehab Pager      [phone]

## 2016-09-12 ENCOUNTER — Inpatient Hospital Stay (HOSPITAL_COMMUNITY): Payer: Medicare Other

## 2016-09-12 ENCOUNTER — Telehealth: Payer: Self-pay | Admitting: *Deleted

## 2016-09-12 DIAGNOSIS — E876 Hypokalemia: Secondary | ICD-10-CM

## 2016-09-12 DIAGNOSIS — Z7189 Other specified counseling: Secondary | ICD-10-CM

## 2016-09-12 DIAGNOSIS — K56609 Unspecified intestinal obstruction, unspecified as to partial versus complete obstruction: Secondary | ICD-10-CM

## 2016-09-12 DIAGNOSIS — J690 Pneumonitis due to inhalation of food and vomit: Secondary | ICD-10-CM

## 2016-09-12 LAB — CULTURE, BLOOD (ROUTINE X 2)
CULTURE: NO GROWTH
Culture: NO GROWTH
SPECIAL REQUESTS: ADEQUATE
SPECIAL REQUESTS: ADEQUATE

## 2016-09-12 LAB — BASIC METABOLIC PANEL
ANION GAP: 7 (ref 5–15)
BUN: 6 mg/dL (ref 6–20)
CO2: 29 mmol/L (ref 22–32)
Calcium: 8.4 mg/dL — ABNORMAL LOW (ref 8.9–10.3)
Chloride: 98 mmol/L — ABNORMAL LOW (ref 101–111)
Creatinine, Ser: 0.76 mg/dL (ref 0.44–1.00)
GFR calc Af Amer: >60 mL/min (ref >60)
GFR calc non Af Amer: >60 mL/min (ref >60)
GLUCOSE: 103 mg/dL — AB (ref 65–99)
Potassium: 3.4 mmol/L — ABNORMAL LOW (ref 3.5–5.1)
Sodium: 134 mmol/L — ABNORMAL LOW (ref 135–145)

## 2016-09-12 LAB — MAGNESIUM: Magnesium: 1.1 mg/dL — ABNORMAL LOW (ref 1.7–2.4)

## 2016-09-12 LAB — CBC
HEMATOCRIT: 29.4 % — AB (ref 36.0–46.0)
HEMOGLOBIN: 9.6 g/dL — AB (ref 12.0–15.0)
MCH: 28.3 pg (ref 26.0–34.0)
MCHC: 32.7 g/dL (ref 30.0–36.0)
MCV: 86.7 fL (ref 78.0–100.0)
Platelets: 287 10*3/uL (ref 150–400)
RBC: 3.39 MIL/uL — ABNORMAL LOW (ref 3.87–5.11)
RDW: 15.4 % (ref 11.5–15.5)
WBC: 20.9 10*3/uL — ABNORMAL HIGH (ref 4.0–10.5)

## 2016-09-12 MED ORDER — POTASSIUM CHLORIDE CRYS ER 20 MEQ PO TBCR: 20.0000 meq | EXTENDED_RELEASE_TABLET | Freq: Two times a day (BID) | ORAL | Status: DC

## 2016-09-12 MED ORDER — PANTOPRAZOLE SODIUM 40 MG PO TBEC
40.0000 mg | DELAYED_RELEASE_TABLET | Freq: Every day | ORAL | Status: DC
  Administered 2016-09-12: 40 mg via ORAL
  Filled 2016-09-12: qty 1

## 2016-09-12 MED ORDER — SODIUM CHLORIDE 0.9% FLUSH
10.0000 mL | INTRAVENOUS | Status: DC | PRN
  Administered 2016-09-12: 10 mL
  Filled 2016-09-12: qty 40

## 2016-09-12 MED ORDER — POTASSIUM CHLORIDE CRYS ER 20 MEQ PO TBCR
40.0000 meq | EXTENDED_RELEASE_TABLET | Freq: Once | ORAL | Status: AC
  Administered 2016-09-12: 40 meq via ORAL
  Filled 2016-09-12: qty 2

## 2016-09-12 MED ORDER — METOPROLOL TARTRATE 12.5 MG HALF TABLET
12.5000 mg | ORAL_TABLET | Freq: Two times a day (BID) | ORAL | Status: DC
  Administered 2016-09-12: 12.5 mg via ORAL
  Filled 2016-09-12: qty 1

## 2016-09-12 MED ORDER — METOPROLOL TARTRATE 25 MG PO TABS: 12.5000 mg | ORAL_TABLET | Freq: Two times a day (BID) | ORAL | 0 refills | Status: DC

## 2016-09-12 MED ORDER — HEPARIN SOD (PORK) LOCK FLUSH 100 UNIT/ML IV SOLN
500.0000 [IU] | INTRAVENOUS | Status: AC | PRN
  Administered 2016-09-12: 500 [IU]

## 2016-09-12 MED ORDER — POTASSIUM CHLORIDE CRYS ER 20 MEQ PO TBCR: 20.0000 meq | EXTENDED_RELEASE_TABLET | Freq: Two times a day (BID) | ORAL | 0 refills | Status: AC

## 2016-09-12 MED ORDER — AMOXICILLIN-POT CLAVULANATE 875-125 MG PO TABS: 1.0000 | ORAL_TABLET | Freq: Two times a day (BID) | ORAL | 0 refills | Status: AC

## 2016-09-12 NOTE — Progress Notes (Signed)
Discharge planning, spoke with patient and daughter at beside. Has [place] for [facility] services prior to admission, contacted [facility] for referral. Has all needed equipment.  [phone]

## 2016-09-12 NOTE — Progress Notes (Addendum)
Physical Therapy Treatment Patient Details Name: Christina Peterson MRN: [id number] DOB: [birth year] Today's Date: 09/12/2016    History of Present Illness Christina Peterson is a 76 y.o. female with medical history significant of HTN, HLD, severe mitral regurgitation, newly diagnosed non-Hodgkin's lymphoma; who presents with complaints of generalized weakness. Patient was last admitted to the hospital from 5/9 - 5/15 for hypovolemic hyponatremia, abdominal pain, and generalized weakness. Patient was discharged home, but since that time has not been doing well., adm for sepsis/pna, ileus    PT Comments    Pt progressing well with mobility, she ambulated 200' with rollator. PT now recommending HHPT rather than SNF.   Follow Up Recommendations  HHPT, supervision for mobility     Equipment Recommendations  None recommended by PT    Recommendations for Other Services       Precautions / Restrictions Precautions Precautions: Fall Restrictions Weight Bearing Restrictions: No    Mobility  Bed Mobility   Bed Mobility: Rolling;Sidelying to Sit Rolling: Supervision Sidelying to sit: Supervision       General bed mobility comments: VCs for technique  Transfers Overall transfer level: Needs assistance Equipment used: 4-wheeled walker Transfers: Sit to/from Stand Sit to Stand: Min guard         General transfer comment: cues for UE placement  Ambulation/Gait Ambulation/Gait assistance: Supervision Ambulation Distance (Feet): 200 Feet Assistive device: 4-wheeled walker Gait Pattern/deviations: WFL(Within Functional Limits)   Gait velocity interpretation: at or above normal speed for age/gender General Gait Details: steady, no LOB, distance limited by abdominal pain   Stairs            Wheelchair Mobility    Modified Rankin (Stroke Patients Only)       Balance Overall balance assessment: Needs assistance   Sitting balance-Leahy Scale: Good       Standing  balance-Leahy Scale: Fair Standing balance comment: reliant on UEs                            Cognition Arousal/Alertness: Awake/alert Behavior During Therapy: WFL for tasks assessed/performed Overall Cognitive Status: Within Functional Limits for tasks assessed                                        Exercises      General Comments        Pertinent Vitals/Pain Pain Score: 8  Pain Location: abdomen Pain Descriptors / Indicators: Sore Pain Intervention(s): Limited activity within patient's tolerance;Monitored during session;Patient requesting pain meds-RN notified    Home Living                      Prior Function            PT Goals (current goals can now be found in the care plan section) Acute Rehab PT Goals Patient Stated Goal: likes to cook and clean PT Goal Formulation: With patient/family Time For Goal Achievement: 09/23/16 Potential to Achieve Goals: Good Progress towards PT goals: Goals met and updated - see care plan    Frequency    Min 3X/week      PT Plan Current plan remains appropriate    Co-evaluation              AM-PAC PT "6 Clicks" Daily Activity  Outcome Measure  Difficulty turning over in bed (including adjusting bedclothes,  sheets and blankets)?: A Little Difficulty moving from lying on back to sitting on the side of the bed? : A Little Difficulty sitting down on and standing up from a chair with arms (e.g., wheelchair, bedside commode, etc,.)?: A Little Help needed moving to and from a bed to chair (including a wheelchair)?: A Little Help needed walking in hospital room?: A Little Help needed climbing 3-5 steps with a railing? : A Lot 6 Click Score: 17    End of Session Equipment Utilized During Treatment: Gait belt Activity Tolerance: Patient tolerated treatment well Patient left: with call bell/phone within reach;with family/visitor present;in bed Nurse Communication: Mobility status (RN  assisted by following with recliner) PT Visit Diagnosis: Other abnormalities of gait and mobility (R26.89);Muscle weakness (generalized) (M62.81)     Time: 8251-8984 PT Time Calculation (min) (ACUTE ONLY): 9 min  Charges:  $Gait Training: 8-22 mins                    G Codes:          Christina Peterson 09/12/2016, 2:24 PM [phone]

## 2016-09-12 NOTE — Progress Notes (Signed)
Occupational Therapy Treatment Patient Details Name: Christina Peterson MRN: [id number] DOB: [birth year] Today's Date: 09/12/2016    History of present illness Christina Peterson is a 76 y.o. female with medical history significant of HTN, HLD, severe mitral regurgitation, newly diagnosed non-Hodgkin's lymphoma; who presents with complaints of generalized weakness. Patient was last admitted to the hospital from 5/9 - 5/15 for hypovolemic hyponatremia, abdominal pain, and generalized weakness. Patient was discharged home, but since that time has not been doing well., adm for sepsis/pna, ileus   OT comments  Performed ADL and toilet transfer. Daughter present. Pt does not want SNF and wants to return home at time of d/c.  Will continue to work with her and recommend [place]  Follow Up Recommendations  Home health OT;Supervision/Assistance - 24 hour    Equipment Recommendations  None recommended by OT (has BSC)    Recommendations for Other Services      Precautions / Restrictions Precautions Precautions: Fall Restrictions Weight Bearing Restrictions: No       Mobility Bed Mobility         Supine to sit: Min assist     General bed mobility comments: assist for trunk  Transfers   Equipment used: None;Rolling walker (2 wheeled)   Sit to Stand: Min assist Stand pivot transfers: Min guard       General transfer comment: cues for UE placement    Balance                                           ADL either performed or assessed with clinical judgement   ADL Overall ADL's : Needs assistance/impaired     Grooming: Wash/dry hands;Sitting   Upper Body Bathing: Set up;Sitting   Lower Body Bathing: Moderate assistance;Sit to/from stand   Upper Body Dressing : Minimal assistance;Sitting (lines)   Lower Body Dressing: Moderate assistance;Sit to/from stand   Toilet Transfer: Minimal assistance;Stand-pivot;BSC;RW   Toileting- Clothing Manipulation and Hygiene:  Minimal assistance;Sit to/from stand         General ADL Comments: pt performed ADL from EOB. Transferred to [facility] then to chair.  Fatiques quickly. Rest breaks encouraged.  Pt has a BSC at home     Vision       Perception     Praxis      Cognition Arousal/Alertness: Awake/alert Behavior During Therapy: WFL for tasks assessed/performed Overall Cognitive Status: Within Functional Limits for tasks assessed                                          Exercises     Shoulder Instructions       General Comments      Pertinent Vitals/ Pain       Pain Assessment: No/denies pain  Home Living                                          Prior Functioning/Environment              Frequency  Min 2X/week        Progress Toward Goals  OT Goals(current goals can now be found in the care plan section)  Progress towards OT goals:  Progressing toward goals  Acute Rehab OT Goals Patient Stated Goal: none stated  Plan Discharge plan needs to be updated    Co-evaluation                 AM-PAC PT "6 Clicks" Daily Activity     Outcome Measure   Help from another person eating meals?: None Help from another person taking care of personal grooming?: A Little Help from another person toileting, which includes using toliet, bedpan, or urinal?: A Little Help from another person bathing (including washing, rinsing, drying)?: A Lot Help from another person to put on and taking off regular upper body clothing?: A Little Help from another person to put on and taking off regular lower body clothing?: A Lot 6 Click Score: 17    End of Session    OT Visit Diagnosis: Unsteadiness on feet (R26.81);Muscle weakness (generalized) (M62.81)   Activity Tolerance Patient limited by fatigue   Patient Left in chair;with call bell/phone within reach;with family/visitor present   Nurse Communication Mobility status        Time: 5015-8682 OT Time  Calculation (min): 29 min  Charges: OT General Charges $OT Visit: 1 Procedure OT Treatments $Self Care/Home Management : 23-37 mins  Lesle Chris, OTR/L [phone] 09/12/2016   Katy 09/12/2016, 11:02 AM

## 2016-09-12 NOTE — Consult Note (Signed)
   [facility] CM Inpatient Consult   09/12/2016  Christina Peterson 1940-10-02 [id number]     Patient screened for [place] Management services. Went to bedside to speak with patient and family prior to discharge. Briefly discussed [place] Management services. However, after further discussion, discovered patient's Primary Care MD is not at [facility] Provider. Primary Care Provider is Kathe Becton, FNP with [facility].  Made inpatient RNCM aware that patient is unfortunately not eligible for [facility] Care Management services at this time.    Marthenia Rolling, MSN-Ed, RN,BSN [facility] Liaison [phone]

## 2016-09-12 NOTE — Telephone Encounter (Signed)
"  This is Terrie RN [facility] 5th floor.  This patient will be discharged.  Calling to make an appointment for this patient as requested by Dr. Wynelle Cleveland."  Informed of the F/U and treatment scheduled on 09-26-2016.  Terrie will "check with Dr. Wynelle Cleveland to make sure he is aware of current appointments.  I think he wants lab draw Monday or Tuesday.  I will call back if needed.  Patient is being discharged to her home with home health."

## 2016-09-12 NOTE — Discharge Summary (Signed)
Physician Discharge Summary  Christina Peterson YQM:[id number] DOB: 19-Oct-1940 DOA: 09/06/2016  PCP: Julius Bowels, MD (Inactive)  Admit date: 09/06/2016 Discharge date: 09/12/2016  Admitted From: home Disposition:  home   Recommendations for Outpatient Follow-up:  1. F/u Bmet, CBC and oral intake 2. Cardiology follow up for A-fib-   Home Health:  ordered     Discharge Condition: stable  CODE STATUS:  Full code   Consultations:  Gen surgery  oncology    Discharge Diagnoses:  Principal Problem:   Sepsis ([place]) Active Problems:   Hyponatremia   Ileus ([place])   Aspiration pneumonia due to inhalation of vomitus (H[facility])   Hypokalemia   Normocytic normochromic anemia   Constipation/   Nausea and vomiting   Severe protein-calorie malnutrition (HCC)   Diffuse large B-cell lymphoma of lymph nodes of multiple regions (HCC)     Subjective: Tolerating solid food. Had 2 BMs yesterday.No nausea or abdominal pain.  No cough or dyspnea.   Brief Summary: Christina Viruet Smithis a 76 y.o.femalewith medical history significant of HTN, HLD, severe mitral regurgitation, newly diagnosed non-Hodgkin's lymphoma who presents with complaints of generalized weakness. She was last admitted to the hospital from 5/9 - 5/15 for hypovolemic hyponatremia, abdominal pain, and generalized weakness. She was discharged home but continued to have a poor appetite and was noted to be constipated. She presented with nausea, vomiting and abdominal pain. She was found to have multifocal pneumonia and ileus vs SBO on Ct scan. WBC count was 14.4, sodium, 123, Cr 74, BUN 35, Cr 2.77 and LA of 5.63. NG tube was placed yielding 1.4 L of fluid. Surgery team was consulted as well.   Hospital Course:  Principal Problem:   Sepsis - due to PNA (which is likely aspiration) - CT on admission   "Diffuse nodular consolidation throughout both lung bases could indicate multifocal pneumonia, aspiration, or lymphoma involvement." -  treated as HCAP - Vancomycin discontinued on 5/28 - essentially asymptomatic by the time she was transitioned to our care on 5/30 - WBC still rising but this may be secondary to Neulasta which was given on 5/16 - will transition to Augmentin today    Active Problems:   Hyponatremia - suspected to be hypovolemia and has improved with IVF - has had a poor oral intake at home per family which likely has contributed to hyponatremia - I have had an extensive discussion today with patient and son about maintaining adequate hydration - would recommend close f/u with lab work as outpt  Ileus vs SBO - likely ileus- possibly related to hyponatremia and ongoing hypokalemia - has resolve- 5/29 NG removed and patient placed on clear liquids - now tolerating solid food without complaints - will need to ensure she has an adequate  Acute encephalopathy characterized by lethargy - improved- quite alert now  Hypokalemia - K as low as 2.5- has been replaced- will increase daily KCL dose  from 20 mg daily to BID - cont to follow as outpt      Severe protein-calorie malnutrition  - have spoken with patient and family about dietry supplements and have ordered them    Diffuse large B-cell lymphoma of lymph nodes of multiple regions (with retroperitoneal lymphadenopathy)  - per oncology, Dr Irene Limbo - lact chemo was 5/14   A fib RVR - likely related to acute illness and dehydration - ECHO on 5/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 - given IV PRN metoprolol and now started on low dose Metoprolol - CHA2DS2-VASc Score  at least 5 - I have not started oral anticoagulation based on this episode which could have been prompted by electrolyte abnormalities in setting of ileus and sepsis - she has a f/u appt with Cardiology later this week- the family does not know who she is following up with- I would recommend an event monitor   Chronic pain - MS Contin on hold which is reasonable for now as she did present with  constipation and ileus- she is not in constant pain- can resume oxycodone which she takes PRN at home   HTN - Losartan      Normocytic normochromic anemia   Discharge Instructions  Discharge Instructions    Diet general    Complete by:  As directed    Increase activity slowly    Complete by:  As directed      Allergies as of 09/12/2016   No Known Allergies     Medication List    STOP taking these medications   morphine 15 MG 12 hr tablet Commonly known as:  MS CONTIN     TAKE these medications   allopurinol 100 MG tablet Commonly known as:  ZYLOPRIM Take 1 tablet (100 mg total) by mouth 2 (two) times daily.   amoxicillin-clavulanate 875-125 MG tablet Commonly known as:  AUGMENTIN Take 1 tablet by mouth every 12 (twelve) hours.   docusate sodium 100 MG capsule Commonly known as:  COLACE Take 1 capsule (100 mg total) by mouth every 12 (twelve) hours.   feeding supplement (ENSURE ENLIVE) Liqd Take 237 mLs by mouth 2 (two) times daily between meals. What changed:  when to take this  reasons to take this   feeding supplement Liqd Take 1 Container by mouth 2 (two) times daily between meals. What changed:  Another medication with the same name was changed. Make sure you understand how and when to take each.   losartan 50 MG tablet Commonly known as:  COZAAR Take 1 tablet (50 mg total) by mouth daily.   metoprolol tartrate 25 MG tablet Commonly known as:  LOPRESSOR Take 0.5 tablets (12.5 mg total) by mouth 2 (two) times daily.   ondansetron 8 MG tablet Commonly known as:  ZOFRAN Take 1 tablet (8 mg total) by mouth every 8 (eight) hours as needed for nausea or vomiting.   oxyCODONE 5 MG immediate release tablet Commonly known as:  Oxy IR/ROXICODONE Take 1-2 tablets (5-10 mg total) by mouth every 4 (four) hours as needed for severe pain or breakthrough pain.   pantoprazole 20 MG tablet Commonly known as:  PROTONIX Take 1 tablet (20 mg total) by mouth  daily.   polyethylene glycol packet Commonly known as:  MIRALAX / GLYCOLAX Take 17 g by mouth daily.   potassium chloride SA 20 MEQ tablet Commonly known as:  K-DUR,KLOR-CON Take 1 tablet (20 mEq total) by mouth 2 (two) times daily. What changed:  when to take this   predniSONE 20 MG tablet Commonly known as:  DELTASONE Take 3 tablets (60 mg total) by mouth See admin instructions. 60 mg po daily Day 1 to Day 5 of each chemotherapy cycle   prochlorperazine 10 MG tablet Commonly known as:  COMPAZINE Take 1 tablet (10 mg total) by mouth every 6 (six) hours as needed for nausea or vomiting.   senna-docusate 8.6-50 MG tablet Commonly known as:  SENNA S Take 2 tablets by mouth 2 (two) times daily. Reduce to 2tab po HS if diarrhea occurs   sucralfate 1 g tablet  Commonly known as:  CARAFATE Take 1 tablet (1 g total) by mouth 4 (four) times daily.       No Known Allergies   Procedures/Studies:    Ct Abdomen Pelvis Wo Contrast  Result Date: 09/06/2016 CLINICAL DATA:  Generalize weakness. Abdominal pain. 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 Weakness, nausea, and vomiting since chemotherapy. EXAM: CT ABDOMEN AND PELVIS WITHOUT CONTRAST TECHNIQUE: Multidetector CT imaging of the abdomen and pelvis was performed following the standard protocol without IV contrast. COMPARISON:  08/12/2016 FINDINGS: Lower chest: Diffuse nodular consolidation throughout both lung bases. This is nonspecific in could be due to multifocal pneumonia, aspiration, or lymphoma. Cardiac enlargement. Mildly distended fluid-filled esophagus suggesting reflux or dysmotility. Hepatobiliary: No focal liver lesions identified. Gallbladder is unremarkable. Bile ducts are mildly dilated, likely due to extrinsic compression. Pancreas: Pancreas appears grossly unremarkable. Large lymph node adjacent to the pancreatic head measures 4.2 cm in diameter. This was better seen on the previous study with contrast. The lesion  appears unchanged in size. Spleen: Spleen size is normal.  No focal lesions identified. Adrenals/Urinary Tract: Adrenal glands are unremarkable. Kidneys are normal, without renal calculi, focal lesion, or hydronephrosis. Bladder is unremarkable. Stomach/Bowel: The stomach is diffusely distended with fluid. No gastric wall thickening. Duodenum and proximal small bowel are mildly distended and filled with fluid and gas. No definite wall thickening. The distal small bowel is relatively decompressed. The colon is also mildly distended. Prominent stool in the cecum. Small bowel obstruction is not excluded the changes could be due to decreased motility is well. Vascular/Lymphatic: Calcification of the abdominal aorta. No aneurysm. Prominent celiac axis, mesenteric, and retroperitoneal lymphadenopathy is better depicted on previous contrast-enhanced scan but appears to be relatively unchanged. Reproductive: Status post hysterectomy. No adnexal masses. Other: No free air or free fluid in the abdomen. Abdominal wall musculature appears intact. Musculoskeletal: Postoperative left hip arthroplasty. Degenerative changes throughout the spine. Multiple focal areas of vague lucency demonstrated in the bones probably representing metastatic disease. IMPRESSION: Diffuse nodular consolidation throughout both lung bases could indicate multifocal pneumonia, aspiration, or lymphoma involvement. This is new since previous study. Abdominal and retroperitoneal lymphadenopathy appears similar to prior study. Comparison is difficult due to lack of IV contrast material. Probable lymphoma involvement of the bones. Fluid distention of the esophagus, stomach, proximal small bowel, and colon. Obstruction is not excluded but changes could be due to decreased motility. Electronically Signed   By: Lucienne Capers M.D.   On: 09/06/2016 23:41   Dg Abd 1 View  Result Date: 09/07/2016 CLINICAL DATA:  Post NG tube placement EXAM: ABDOMEN - 1 VIEW  COMPARISON:  Earlier same day FINDINGS: Enteric tube tip and side port projected the expected location of the gastric antrum. Worsening gas distention of multiple loops of large and small bowel with index loop of small bowel measuring approximately 2.5 cm in diameter. No supine evidence of pneumoperitoneum. No definite pneumatosis or portal venous gas. Multiple phleboliths overlie the lower pelvis bilaterally, left greater than right. Limited visualization of lower thorax demonstrates air bronchograms within the image right lower lung. Post left total hip replacement. Moderate degenerative change the right hip with joint space loss, subchondral sclerosis and osteophytosis. IMPRESSION: 1. Enteric tube tip and side port project over the expected location of the gastric antrum. 2. Findings suggestive of worsening ileus. 3. Findings worrisome for right lower lung pneumonia. Further evaluation dedicated chest radiograph could be performed as indicated. Electronically Signed   By: Eldridge Abrahams.D.  On: 09/07/2016 13:32   Dg Abd 1 View  Result Date: 09/07/2016 CLINICAL DATA:  Weakness.  Nausea and vomiting. EXAM: ABDOMEN - 1 VIEW COMPARISON:  CT scan Sep 06, 2016 FINDINGS: Increased density in the upper abdomen is consistent with the fluid-filled distended stomach seen on yesterday's CT scan. Air-filled loops of large and small bowel may represent ileus. No other acute abnormalities. IMPRESSION: 1. Increased density in the upper abdomen consistent with the distended fluid-filled stomach seen on yesterday's CT scan. 2. Air-filled mildly prominent loops of large and small bowel may represent ileus. Electronically Signed   By: Dorise Bullion III M.D   On: 09/07/2016 09:14   Mr Brain W Wo Contrast  Result Date: 08/22/2016 CLINICAL DATA:  Non-Hodgkin's lymphoma, abdominal pain, and weight loss. EXAM: MRI HEAD WITHOUT AND WITH CONTRAST TECHNIQUE: Multiplanar, multiecho pulse sequences of the brain and surrounding  structures were obtained without and with intravenous contrast. CONTRAST:  39mL MULTIHANCE GADOBENATE DIMEGLUMINE 529 MG/ML IV SOLN COMPARISON:  None. FINDINGS: Brain: No evidence for acute infarction, hemorrhage, mass lesion, hydrocephalus, or extra-axial fluid. Mild atrophy. Mild subcortical and periventricular T2 and FLAIR hyperintensities, likely chronic microvascular ischemic change. Post infusion, no abnormal enhancement of the brain or meninges. Partial empty sella. Vascular: Normal flow voids. Skull and upper cervical spine: Normal marrow signal. Cervical spondylosis. Sinuses/Orbits: Chronic sinus disease. No acute orbital findings or masses. Other: None. IMPRESSION: Mild atrophy and small vessel disease. No acute intracranial findings. No abnormal postcontrast enhancement or osseous changes to suggest lymphoma within or surrounding the visualized CNS. Electronically Signed   By: Staci Righter M.D.   On: 08/22/2016 18:47   Ir US Guide Vasc Access Right  Result Date: 08/23/2016 CLINICAL DATA:  Lymphoma, access for chemotherapy EXAM: RIGHT INTERNAL JUGULAR SINGLE LUMEN POWER PORT CATHETER INSERTION Date:  5/11/20185/02/2017 4:19 pm Radiologist:  M. Daryll Brod, MD Guidance:  Ultrasound and fluoroscopic MEDICATIONS: Ancef 2 g; The antibiotic was administered within an appropriate time interval prior to skin puncture. ANESTHESIA/SEDATION: Versed 2.0 mg IV; Fentanyl 75 mcg IV; Moderate Sedation Time:  24 minutes The patient was continuously monitored during the procedure by the interventional radiology nurse under my direct supervision. FLUOROSCOPY TIME:  54 seconds (4 mGy) COMPLICATIONS: None immediate. CONTRAST:  None. PROCEDURE: Informed consent was obtained from the patient following explanation of the procedure, risks, benefits and alternatives. The patient understands, agrees and consents for the procedure. All questions were addressed. A time out was performed. Maximal barrier sterile technique  utilized including caps, mask, sterile gowns, sterile gloves, large sterile drape, hand hygiene, and 2% chlorhexidine scrub. Under sterile conditions and local anesthesia, right internal jugular micropuncture venous access was performed. Access was performed with ultrasound. Images were obtained for documentation. A guide wire was inserted followed by a transitional dilator. This allowed insertion of a guide wire and catheter into the IVC. Measurements were obtained from the SVC / RA junction back to the right IJ venotomy site. In the right infraclavicular chest, a subcutaneous pocket was created over the second anterior rib. This was done under sterile conditions and local anesthesia. 1% lidocaine with epinephrine was utilized for this. A 2.5 cm incision was made in the skin. Blunt dissection was performed to create a subcutaneous pocket over the right pectoralis major muscle. The pocket was flushed with saline vigorously. There was adequate hemostasis. The port catheter was assembled and checked for leakage. The port catheter was secured in the pocket with two retention sutures. The tubing was tunneled  subcutaneously to the right venotomy site and inserted into the SVC/RA junction through a valved peel-away sheath. Position was confirmed with fluoroscopy. Images were obtained for documentation. The patient tolerated the procedure well. No immediate complications. Incisions were closed in a two layer fashion with 4 - 0 Vicryl suture. Dermabond was applied to the skin. The port catheter was accessed, blood was aspirated followed by saline and heparin flushes. Needle was removed. A dry sterile dressing was applied. IMPRESSION: Ultrasound and fluoroscopically guided right internal jugular single lumen power port catheter insertion. Tip in the SVC/RA junction. Catheter ready for use. Electronically Signed   By: Jerilynn Mages.  Shick M.D.   On: 08/23/2016 16:21   Ct Biopsy  Result Date: 08/14/2016 INDICATION: No known primary,  now with indeterminate right-sided renal lesion and retroperitoneal lymphadenopathy. Please perform CT-guided biopsy for tissue diagnostic purposes. EXAM: CT-GUIDED BIOPSY OF INDETERMINATE RIGHT-SIDED RETROPERITONEAL LYMPHADENOPATHY. COMPARISON:  CT the chest, abdomen and pelvis - 07/16/2016 MEDICATIONS: None. ANESTHESIA/SEDATION: Fentanyl 100 mcg IV; Versed 2 mg IV Sedation time: 15 minutes; The patient was continuously monitored during the procedure by the interventional radiology nurse under my direct supervision. CONTRAST:  None. COMPLICATIONS: None immediate. PROCEDURE: Informed consent was obtained from the patient following an explanation of the procedure, risks, benefits and alternatives. A time out was performed prior to the initiation of the procedure. The patient was positioned prone on the CT table and a limited CT was performed for procedural planning demonstrating unchanged size and appearance of the dominant right-sided retroperitoneal nodal conglomeration with dominant component measuring approximately 3.5 x 2.9 cm (image 25, series 3). The procedure was planned. The operative site was prepped and draped in the usual sterile fashion. Appropriate trajectory was confirmed with a 22 gauge spinal needle after the adjacent tissues were anesthetized with 1% Lidocaine with epinephrine. Under intermittent CT guidance, a 17 gauge coaxial needle was advanced into the peripheral aspect of the mass. Appropriate positioning was confirmed and 5 core needle biopsy samples were obtained with an 18 gauge core needle biopsy device. The co-axial needle was removed and hemostasis was achieved with manual compression. A limited postprocedural CT was negative for hemorrhage or additional complication. A dressing was placed. The patient tolerated the procedure well without immediate postprocedural complication. IMPRESSION: Technically successful CT guided core needle biopsy of indeterminate right-sided retroperitoneal  nodal conglomeration. Electronically Signed   By: Sandi Mariscal M.D.   On: 08/14/2016 13:50   Dg Chest Port 1 View  Result Date: 09/07/2016 CLINICAL DATA:  Hypoxia. EXAM: PORTABLE CHEST 1 VIEW COMPARISON:  Radiograph of Sep 06, 2016. FINDINGS: Stable cardiomegaly. No pneumothorax is noted. Nasogastric tube is seen entering stomach with distal tip in distal stomach. Right internal jugular Port-A-Cath is noted with distal tip in expected position of cavoatrial junction. Bilateral lower lobe opacities are noted, right greater than left, consistent with edema or pneumonia. Minimal bilateral pleural effusions cannot be excluded. Bony thorax is unremarkable. IMPRESSION: Stable bibasilar opacities are noted most consistent with pneumonia or possibly edema. Minimal bilateral pleural effusions may be present. Interval placement of nasogastric tube with distal tip in expected position of distal stomach. Electronically Signed   By: Marijo Conception, M.D.   On: 09/07/2016 14:12   Dg Chest Port 1 View  Result Date: 09/07/2016 CLINICAL DATA:  Acute onset of generalized weakness. Stage 3 lymphoma. Nausea and vomiting. Initial encounter. EXAM: PORTABLE CHEST 1 VIEW COMPARISON:  Chest radiograph performed 10/01/2015, and CT of the chest performed 08/12/2016 FINDINGS: Bibasilar airspace  opacities raise concern for pneumonia. No definite pleural effusion or pneumothorax is seen. The cardiomediastinal silhouette is mildly enlarged. No acute osseous abnormalities are identified. A right-sided chest port is noted ending about the distal SVC. IMPRESSION: 1. Bibasilar airspace opacities raise concern for pneumonia. 2. Mild cardiomegaly. Electronically Signed   By: Garald Balding M.D.   On: 09/07/2016 00:12   Dg Abd 2 Views  Result Date: 09/08/2016 CLINICAL DATA:  Lethargy, abdominal pain, distension EXAM: ABDOMEN - 2 VIEW COMPARISON:  09/07/2016 FINDINGS: Nasogastric tube with the tip projecting over the antrum of the stomach.  There is no bowel dilatation to suggest obstruction. There is no evidence of pneumoperitoneum, portal venous gas or pneumatosis. There are no pathologic calcifications along the expected course of the ureters. Total left hip arthroplasty without failure or complication. Moderate osteoarthritis of the right hip. No aggressive osseous lesion. IMPRESSION: 1. Nasogastric tube with the tip projecting over the antrum of the stomach. 2. No evidence of bowel obstruction. Electronically Signed   By: Kathreen Devoid   On: 09/08/2016 11:29   Dg Abd Portable 1v  Result Date: 09/10/2016 CLINICAL DATA:  Ileus EXAM: PORTABLE ABDOMEN - 1 VIEW COMPARISON:  09/08/2016 abdominal radiographs FINDINGS: Enteric tube terminates in the distal stomach. No dilated small bowel loops. Mild colonic gas and stool. No evidence of pneumatosis or pneumoperitoneum. No radiopaque urolithiasis. Partially visualized left total hip arthroplasty. Patchy bibasilar lung opacities. IMPRESSION: 1. Enteric tube terminates in the distal stomach . 2. Nonobstructive bowel gas pattern. 3. Patchy bibasilar lung opacities, correlate with chest radiograph as clinically warranted. Electronically Signed   By: Ilona Sorrel M.D.   On: 09/10/2016 08:00   Ir Fluoro Guide Port Insertion Right  Result Date: 08/23/2016 CLINICAL DATA:  Lymphoma, access for chemotherapy EXAM: RIGHT INTERNAL JUGULAR SINGLE LUMEN POWER PORT CATHETER INSERTION Date:  5/11/20185/02/2017 4:19 pm Radiologist:  M. Daryll Brod, MD Guidance:  Ultrasound and fluoroscopic MEDICATIONS: Ancef 2 g; The antibiotic was administered within an appropriate time interval prior to skin puncture. ANESTHESIA/SEDATION: Versed 2.0 mg IV; Fentanyl 75 mcg IV; Moderate Sedation Time:  24 minutes The patient was continuously monitored during the procedure by the interventional radiology nurse under my direct supervision. FLUOROSCOPY TIME:  54 seconds (4 mGy) COMPLICATIONS: None immediate. CONTRAST:  None. PROCEDURE:  Informed consent was obtained from the patient following explanation of the procedure, risks, benefits and alternatives. The patient understands, agrees and consents for the procedure. All questions were addressed. A time out was performed. Maximal barrier sterile technique utilized including caps, mask, sterile gowns, sterile gloves, large sterile drape, hand hygiene, and 2% chlorhexidine scrub. Under sterile conditions and local anesthesia, right internal jugular micropuncture venous access was performed. Access was performed with ultrasound. Images were obtained for documentation. A guide wire was inserted followed by a transitional dilator. This allowed insertion of a guide wire and catheter into the IVC. Measurements were obtained from the SVC / RA junction back to the right IJ venotomy site. In the right infraclavicular chest, a subcutaneous pocket was created over the second anterior rib. This was done under sterile conditions and local anesthesia. 1% lidocaine with epinephrine was utilized for this. A 2.5 cm incision was made in the skin. Blunt dissection was performed to create a subcutaneous pocket over the right pectoralis major muscle. The pocket was flushed with saline vigorously. There was adequate hemostasis. The port catheter was assembled and checked for leakage. The port catheter was secured in the pocket with two retention sutures.  The tubing was tunneled subcutaneously to the right venotomy site and inserted into the SVC/RA junction through a valved peel-away sheath. Position was confirmed with fluoroscopy. Images were obtained for documentation. The patient tolerated the procedure well. No immediate complications. Incisions were closed in a two layer fashion with 4 - 0 Vicryl suture. Dermabond was applied to the skin. The port catheter was accessed, blood was aspirated followed by saline and heparin flushes. Needle was removed. A dry sterile dressing was applied. IMPRESSION: Ultrasound and  fluoroscopically guided right internal jugular single lumen power port catheter insertion. Tip in the SVC/RA junction. Catheter ready for use. Electronically Signed   By: Jerilynn Mages.  Shick M.D.   On: 08/23/2016 16:21       Discharge Exam: Vitals:   09/12/16 0445 09/12/16 1224  BP: 121/68 137/75  Pulse: 69 86  Resp: 18   Temp: 98.3 F (36.8 C)    Vitals:   09/11/16 1600 09/11/16 2108 09/12/16 0445 09/12/16 1224  BP: (!) 161/82 117/62 121/68 137/75  Pulse:  82 69 86  Resp: 18 18 18    Temp:  98.6 F (37 C) 98.3 F (36.8 C)   TempSrc:  Oral Oral   SpO2: 98% 98% 98% 100%  Weight:      Height:        General: Pt is alert, awake, not in acute distress Cardiovascular: RRR, S1/S2 +, no rubs, no gallops Respiratory: CTA bilaterally, no wheezing, no rhonchi Abdominal: Soft, NT, ND, bowel sounds + Extremities: no edema, no cyanosis    The results of significant diagnostics from this hospitalization (including imaging, microbiology, ancillary and laboratory) are listed below for reference.     Microbiology: Recent Results (from the past 240 hour(s))  Blood Culture (routine x 2)     Status: None (Preliminary result)   Collection Time: 09/06/16 11:01 PM  Result Value Ref Range Status   Specimen Description BLOOD RIGHT ANTECUBITAL  Final   Special Requests IN PEDIATRIC BOTTLE Blood Culture adequate volume  Final   Culture   Final    NO GROWTH 4 DAYS Performed at [facility], [address], [place], [place] [address]    Report Status PENDING  Incomplete  Blood Culture (routine x 2)     Status: None (Preliminary result)   Collection Time: 09/06/16 11:07 PM  Result Value Ref Range Status   Specimen Description BLOOD PORT  Final   Special Requests   Final    BOTTLES DRAWN AEROBIC AND ANAEROBIC Blood Culture adequate volume   Culture   Final    NO GROWTH 4 DAYS Performed at [facility], [place] [address], [place], [place] [address]    Report Status PENDING  Incomplete   MRSA PCR Screening     Status: None   Collection Time: 09/07/16  4:00 PM  Result Value Ref Range Status   MRSA by PCR NEGATIVE NEGATIVE Final    Comment:        The GeneXpert MRSA Assay (FDA approved for NASAL specimens only), is one component of a comprehensive MRSA colonization surveillance program. It is not intended to diagnose MRSA infection nor to guide or monitor treatment for MRSA infections.      Labs: BNP (last 3 results) No results for input(s): BNP in the last 8760 hours. Basic Metabolic Panel:  Recent Labs Lab 09/07/16 6168 09/08/16 0617 09/08/16 1833 09/09/16 0252 09/09/16 0300 09/10/16 0452 09/11/16 0158 09/12/16 0333  NA 126* 131* 132*  --  134*  133* 133* 134*  K 3.8 2.5* 3.1*  --  3.1* 3.1* 3.9 3.4*  CL 87* 94* 94*  --  95* 94* 97* 98*  CO2 29 30 31   --  34* 35* 31 29  GLUCOSE 117* 132* 137*  --  131* 120* 118* 103*  BUN 32* 25* 18  --  13 6 6 6   CREATININE 1.74* 1.17* 0.98  --  0.89 0.69 0.68 0.76  CALCIUM 8.7* 8.4* 8.3*  --  8.4* 8.3* 8.5* 8.4*  MG 1.4* 1.9  --  1.5*  --  1.3* 1.4* 1.1*  PHOS 4.4  --   --   --   --   --   --   --    Liver Function Tests:  Recent Labs Lab 09/06/16 2140 09/07/16 0641  AST 26 21  ALT 39 27  ALKPHOS 154* 100  BILITOT 1.8* 1.7*  PROT 6.9 5.4*  ALBUMIN 3.8 2.9*    Recent Labs Lab 09/06/16 2140  LIPASE 19   No results for input(s): AMMONIA in the last 168 hours. CBC:  Recent Labs Lab 09/06/16 2255  09/08/16 0617 09/09/16 0300 09/10/16 0452 09/11/16 0158 09/12/16 0333  WBC 14.4*  < > 15.0* 17.9* 17.7* 19.7* 20.9*  NEUTROABS 13.8*  --   --   --   --   --   --   HGB 10.5*  < > 9.6* 9.5* 9.7* 9.9* 9.6*  HCT 31.8*  < > 28.4* 28.6* 31.1* 30.4* 29.4*  MCV 84.1  < > 84.8 86.7 86.9 86.1 86.7  PLT 150  < > 147* 183 221 225 287  < > = values in this interval not displayed. Cardiac Enzymes: No results for input(s): CKTOTAL, CKMB, CKMBINDEX, TROPONINI in the last 168 hours. BNP: Invalid input(s):  POCBNP CBG:  Recent Labs Lab 09/07/16 1225 09/10/16 1826  GLUCAP 101* 120*   D-Dimer No results for input(s): DDIMER in the last 72 hours. Hgb A1c No results for input(s): HGBA1C in the last 72 hours. Lipid Profile No results for input(s): CHOL, HDL, LDLCALC, TRIG, CHOLHDL, LDLDIRECT in the last 72 hours. Thyroid function studies No results for input(s): TSH, T4TOTAL, T3FREE, THYROIDAB in the last 72 hours.  Invalid input(s): FREET3 Anemia work up No results for input(s): VITAMINB12, FOLATE, FERRITIN, TIBC, IRON, RETICCTPCT in the last 72 hours. Urinalysis    Component Value Date/Time   COLORURINE YELLOW 08/21/2016 1700   APPEARANCEUR CLEAR 08/21/2016 1700   LABSPEC 1.012 08/21/2016 1700   PHURINE 6.0 08/21/2016 1700   GLUCOSEU NEGATIVE 08/21/2016 1700   HGBUR MODERATE (A) 08/21/2016 1700   BILIRUBINUR NEGATIVE 08/21/2016 1700   KETONESUR 5 (A) 08/21/2016 1700   PROTEINUR NEGATIVE 08/21/2016 1700   UROBILINOGEN 0.2 04/30/2011 1145   NITRITE NEGATIVE 08/21/2016 1700   LEUKOCYTESUR NEGATIVE 08/21/2016 1700   Sepsis Labs Invalid input(s): PROCALCITONIN,  WBC,  LACTICIDVEN Microbiology Recent Results (from the past 240 hour(s))  Blood Culture (routine x 2)     Status: None (Preliminary result)   Collection Time: 09/06/16 11:01 PM  Result Value Ref Range Status   Specimen Description BLOOD RIGHT ANTECUBITAL  Final   Special Requests IN PEDIATRIC BOTTLE Blood Culture adequate volume  Final   Culture   Final    NO GROWTH 4 DAYS Performed at [facility], [place] [address], [place], [place] [address]    Report Status PENDING  Incomplete  Blood Culture (routine x 2)     Status: None (Preliminary result)  Collection Time: 09/06/16 11:07 PM  Result Value Ref Range Status   Specimen Description BLOOD PORT  Final   Special Requests   Final    BOTTLES DRAWN AEROBIC AND ANAEROBIC Blood Culture adequate volume   Culture   Final    NO GROWTH 4 DAYS Performed at [facility], [address], [place], [place] [address]    Report Status PENDING  Incomplete  MRSA PCR Screening     Status: None   Collection Time: 09/07/16  4:00 PM  Result Value Ref Range Status   MRSA by PCR NEGATIVE NEGATIVE Final    Comment:        The GeneXpert MRSA Assay (FDA approved for NASAL specimens only), is one component of a comprehensive MRSA colonization surveillance program. It is not intended to diagnose MRSA infection nor to guide or monitor treatment for MRSA infections.      Time coordinating discharge: Over 30 minutes  SIGNED:   Debbe Odea, MD  [facility] 09/12/2016, 2:21 PM Pager   If 7PM-7AM, please contact night-coverage [email or web address] Password [email or web address]

## 2016-09-12 NOTE — Progress Notes (Signed)
Subjective: Transferred to MedSurg unit yesterday Stable without new problems Tolerating clear liquids without nausea.  Cannot eat everything on the tray but ready to advance diet Had 2 bowel movements WBC 20,900.  Out this is related to abdominal problems. Potassium 3.4.  Recommend we correct this. Objective: Vital signs in last 24 hours: Temp:  [98 F (36.7 C)-98.6 F (37 C)] 98.3 F (36.8 C) (05/31 0445) Pulse Rate:  [69-82] 69 (05/31 0445) Resp:  [18-25] 18 (05/31 0445) BP: (117-161)/(62-82) 121/68 (05/31 0445) SpO2:  [94 %-98 %] 98 % (05/31 0445) Last BM Date: 09/11/16  Intake/Output from previous day: 05/30 0701 - 05/31 0700 In: 1736.3 [P.O.:480; I.V.:1256.3] Out: 775 [Urine:775] Intake/Output this shift: Total I/O In: 610 [I.V.:610] Out: 300 [Urine:300]    EXAM: General appearance: Alert. Low BMI. Deconditioned. Resp: Moving air reasonably well. Occasional rhonchi bases. GI: Abdomen fairly soft.  nontender. No mass. No hernia. Hypoactive bowel sounds. No significant distention   Lab Results:   Recent Labs  09/11/16 0158 09/12/16 0333  WBC 19.7* 20.9*  HGB 9.9* 9.6*  HCT 30.4* 29.4*  PLT 225 287   BMET  Recent Labs  09/11/16 0158 09/12/16 0333  NA 133* 134*  K 3.9 3.4*  CL 97* 98*  CO2 31 29  GLUCOSE 118* 103*  BUN 6 6  CREATININE 0.68 0.76  CALCIUM 8.5* 8.4*   PT/INR No results for input(s): LABPROT, INR in the last 72 hours. ABG No results for input(s): PHART, HCO3 in the last 72 hours.  Invalid input(s): PCO2, PO2  Studies/Results: Dg Abd Portable 1v  Result Date: 09/10/2016 CLINICAL DATA:  Ileus EXAM: PORTABLE ABDOMEN - 1 VIEW COMPARISON:  09/08/2016 abdominal radiographs FINDINGS: Enteric tube terminates in the distal stomach. No dilated small bowel loops. Mild colonic gas and stool. No evidence of pneumatosis or pneumoperitoneum. No radiopaque urolithiasis. Partially visualized left total hip arthroplasty. Patchy  bibasilar lung opacities. IMPRESSION: 1. Enteric tube terminates in the distal stomach . 2. Nonobstructive bowel gas pattern. 3. Patchy bibasilar lung opacities, correlate with chest radiograph as clinically warranted. Electronically Signed   By: Ilona Sorrel M.D.   On: 09/10/2016 08:00    Anti-infectives: Anti-infectives    Start     Dose/Rate Route Frequency Ordered Stop   09/09/16 1000  vancomycin (VANCOCIN) 500 mg in sodium chloride 0.9 % 100 mL IVPB  Status:  Discontinued     500 mg 100 mL/hr over 60 Minutes Intravenous Every 12 hours 09/09/16 0726 09/09/16 1138   09/08/16 2200  vancomycin (VANCOCIN) IVPB 750 mg/150 ml premix  Status:  Discontinued     750 mg 150 mL/hr over 60 Minutes Intravenous Every 48 hours 09/07/16 0457 09/07/16 0917   09/07/16 2200  ceFEPIme (MAXIPIME) 1 g in dextrose 5 % 50 mL IVPB  Status:  Discontinued     1 g 100 mL/hr over 30 Minutes Intravenous Every 24 hours 09/07/16 0457 09/07/16 1404   09/07/16 1600  piperacillin-tazobactam (ZOSYN) IVPB 3.375 g     3.375 g 12.5 mL/hr over 240 Minutes Intravenous Every 8 hours 09/07/16 1421     09/07/16 1000  vancomycin (VANCOCIN) 500 mg in sodium chloride 0.9 % 100 mL IVPB  Status:  Discontinued     500 mg 100 mL/hr over 60 Minutes Intravenous Every 24 hours 09/07/16 0917 09/09/16 0726   09/07/16 0000  ceFEPIme (MAXIPIME) 2 g in dextrose 5 % 50 mL IVPB     2 g 100 mL/hr over 30 Minutes Intravenous  Once 09/06/16 2349 09/07/16 0155   09/07/16 0000  vancomycin (VANCOCIN) IVPB 1000 mg/200 mL premix     1,000 mg 200 mL/hr over 60 Minutes Intravenous  Once 09/06/16 2349 09/07/16 0155      Assessment/Plan:  Likely ileus. Likely multifactorial - Resolved Advance diet Replace potassium  Surgery will sign off as there is no apparent surgical problem.  Please reconsult if surgical problems arise  Hypokalemia,  I have ordered K Dur 20 mEq twice a day with meals.  Hypokalemia can contribute to  ileus.  Pneumonia.On Zosyn/Vanc. Leukocytosis probably due to pneumonia   [place] -  Acute encephalopathy. Somewhat better  Severe protein calorie malnutrition  Hypomagnesemia.Being treated  Atrial fibrillation with RVR.Rate controlled. Per medicine Anemia. Stable.   LOS: 5 days    Lenoir Facchini M 09/12/2016

## 2016-09-12 NOTE — Progress Notes (Signed)
Pharmacy Antibiotic Note  Christina Peterson is a 76 y.o. female admitted on 09/06/2016 with pneumonia.  Pharmacy has been consulted for Zosyn dosing.  Today, 09/12/2016  Day #6 antibiotics  WBC trending up, received pefilgrastim 5/16  Afebrile  BCx NGTD and MRSA PCR Negative  Plan: Zosyn 3.375g IV q8 (extended interval infusion) for CrCl > 20 ml/min.  SCr stable. Current dosage is appropriate and need for further dosage adjustment appears unlikely at present. Will sign off at this time.  Please reconsult if a change in clinical status warrants re-evaluation of dosage.  Height: 5\' 5"  (165.1 cm) Weight: 117 lb 8.1 oz (53.3 kg) IBW/kg (Calculated) : 57  Temp (24hrs), Avg:98.3 F (36.8 C), Min:98 F (36.7 C), Max:98.6 F (37 C)   Recent Labs Lab 09/06/16 2156  09/07/16 0008  09/07/16 1726 09/07/16 2133 09/08/16 0617 09/08/16 1833 09/09/16 0300 09/10/16 0452 09/11/16 0158 09/12/16 0333  WBC  --   < >  --   < >  --   --  15.0*  --  17.9* 17.7* 19.7* 20.9*  CREATININE  --   --   --   < >  --   --  1.17* 0.98 0.89 0.69 0.68 0.76  LATICACIDVEN 5.63*  --  1.27  --  1.1 0.9  --   --   --   --   --   --   < > = values in this interval not displayed.  Estimated Creatinine Clearance: 49.6 mL/min (by C-G formula based on SCr of 0.76 mg/dL).    No Known Allergies  Antimicrobials this admission:  5/26 Vanc >> 5/28 5/26 Cefepime >> 5/26 5/26 Zosyn >>  Dose adjustments this admission:  ---  Microbiology results:  5/25 BCx: NGTD 5/26 MRSA PCR: neg  Thank you for allowing pharmacy to be a part of this patient's care.  Hershal Coria, PharmD, BCPS Pager: [phone] 09/12/2016 1:16 PM

## 2016-09-12 NOTE — Care Management Important Message (Signed)
Important Message  Patient Details  Name: ZENIAH BRINEY MRN: [id number] Date of Birth: 1940/12/03   Medicare Important Message Given:  Yes    Kerin Salen 09/12/2016, 11:29 [place] Message  Patient Details  Name: ATHZIRI FREUNDLICH MRN: [id number] Date of Birth: [birth year]   Medicare Important Message Given:  Yes    Kerin Salen 09/12/2016, 11:29 AM

## 2016-09-13 ENCOUNTER — Encounter: Payer: Self-pay | Admitting: Pharmacist

## 2016-09-14 ENCOUNTER — Other Ambulatory Visit: Payer: Self-pay | Admitting: Hematology

## 2016-09-16 ENCOUNTER — Ambulatory Visit: Payer: Medicare Other | Admitting: Hematology

## 2016-09-16 ENCOUNTER — Ambulatory Visit: Payer: Medicare Other

## 2016-09-16 ENCOUNTER — Other Ambulatory Visit: Payer: Medicare Other

## 2016-09-18 ENCOUNTER — Ambulatory Visit: Payer: Medicare Other | Admitting: Cardiology

## 2016-09-18 ENCOUNTER — Encounter: Payer: Self-pay | Admitting: Cardiology

## 2016-09-18 ENCOUNTER — Ambulatory Visit (INDEPENDENT_AMBULATORY_CARE_PROVIDER_SITE_OTHER): Payer: Medicare Other | Admitting: Cardiology

## 2016-09-18 VITALS — BP 100/70 | HR 70 | Ht 59.0 in | Wt 101.0 lb

## 2016-09-18 DIAGNOSIS — I700 Atherosclerosis of aorta: Secondary | ICD-10-CM

## 2016-09-18 DIAGNOSIS — I10 Essential (primary) hypertension: Secondary | ICD-10-CM

## 2016-09-18 DIAGNOSIS — Z8679 Personal history of other diseases of the circulatory system: Secondary | ICD-10-CM | POA: Diagnosis not present

## 2016-09-18 DIAGNOSIS — I340 Nonrheumatic mitral (valve) insufficiency: Secondary | ICD-10-CM | POA: Diagnosis not present

## 2016-09-18 DIAGNOSIS — I341 Nonrheumatic mitral (valve) prolapse: Secondary | ICD-10-CM

## 2016-09-18 DIAGNOSIS — Z7189 Other specified counseling: Secondary | ICD-10-CM

## 2016-09-18 DIAGNOSIS — E43 Unspecified severe protein-calorie malnutrition: Secondary | ICD-10-CM

## 2016-09-18 NOTE — Progress Notes (Signed)
PCP: Smothers, Andree Elk, NP  Oncologist: Dr. Irene Limbo.  Clinic Note: Chief Complaint  Patient presents with  . New Evaluation    follow up from ECHO  . Hospitalization Follow-up  . Cardiac Valve Problem    Mitral Regurgitation  . Atrial Fibrillation    HPI: Christina Peterson is a 76 y.o. female who is being seen today for the evaluation of Mitral Regurgitation / Hospital f/u with Afib RVR at the request of Smothers, Andree Elk, NP. She has diffuse large B-cell lymphoma ->  has already started chemotherapy -  While in the hospital in May. She has a Port-A-Cath in place.;   Overall, acute by severe protein malnutrition   Recent Hospitalizations: 09/06/2016 admitted for dehydration - with aspiration pneumonia.hospital course was complicated by ileus/bowel obstruction. Noted to be hyponatremic.  While in the hospital she had atrial fibrillation with RVR started on low-dose metoprolol.  Studies Personally Reviewed - (if available, images/films reviewed: From Epic Chart or Care Everywhere)  2-D echo 08/23/2013: EF 60-65%. GR 2 DD.posterior leaflet prolapse of mitral valve with anteriorly directed mitral regurgitation. Severe. Severely dilated left atrium.  Interval History:  Christina Peterson presents today with a young woman who I believe is her granddaughter.  She herself Is a very poor historian. She barely speaks and minimally answers questions. Very difficult Much of the history is obtained via chart review.   From the best I can tell, she has been doing relatively well from the time she left hospital. She says that  She will get short of breath simply taking her medications. She has been extremely weak and fatigued, simply going to the bathroom is quite an ordeal for her. She does get about with a walker,  But is not very active.  She denies any sensation of rapid irregular heartbeats or palpitations. Nothing to suggest recurrence of A. Fib.  Unfortunately, I don't think she remembers having A. Fib  while in the hospital.   Similarly, she denies any chest tightness or pressure with rest or exertion. No PND, or edema- but she does sleep sitting up some, cannot really tell me why though Global weakness, but no syncope/near syncope, or TIA/amaurosis fugax symptoms. No melena, hematochezia, hematuria, or epstaxis. No claudication.  ROS: A comprehensive was performed. Review of Systems  Constitutional: Positive for malaise/fatigue and weight loss. Negative for chills and fever.       Walks with a walker. Not active.  HENT: Positive for hearing loss. Negative for congestion and nosebleeds.   Eyes: Negative for blurred vision.  Respiratory: Positive for shortness of breath (When taking pills).   Gastrointestinal: Positive for abdominal pain and blood in stool (Blood on toilet paper but not in stool). Negative for constipation, melena, nausea and vomiting.  Genitourinary: Negative for hematuria.  Musculoskeletal: Positive for back pain. Negative for falls, joint pain and myalgias.       Pain related to metastatic cancer in her back and abdomen.  Neurological: Positive for dizziness and weakness.  Endo/Heme/Allergies:       Both cold and hot intolerance  Psychiatric/Behavioral: Positive for memory loss. The patient has insomnia (Trouble sleeping).        Very flat affect. I am not sure if she has any dementia  All other systems reviewed and are negative.  I have reviewed and (if needed) personally updated the patient's problem list, medications, allergies, past medical and surgical history, social and family history.   Past Medical History:  Diagnosis Date  .  Arthritis   . Diffuse large B-cell lymphoma of lymph nodes of multiple regions ([place]) 08/22/2016  . Hypercholesteremia   . Hypertension    FOLLOWED BY DR Katherine Roan  . Mitral valve prolapse -  severe MR 09/20/2016   Posterior Leaflet  . Severe mitral regurgitation: Per 2 D echo 08/23/2016 08/23/2016   .posterior leaflet prolapse of  mitral valve with anteriorly directed mitral regurgitation. Severe. Severely dilated left atrium.  . Severe protein-calorie malnutrition ([place])     Past Surgical History:  Procedure Laterality Date  . ABDOMINAL HYSTERECTOMY    . IR FLUORO GUIDE PORT INSERTION RIGHT  08/23/2016  . IR US GUIDE VASC ACCESS RIGHT  08/23/2016  . JOINT REPLACEMENT    . 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 TOTAL HIP REVISION  05/02/2011   Procedure: TOTAL HIP REVISION;  Surgeon: Sharmon Revere, MD;  Location: [facility];  Service: Orthopedics;  Laterality: Left;  left total hip revision with bone grafting  . TRANSTHORACIC ECHOCARDIOGRAM  08/23/2013    EF 60-65%. GR 2 DD.posterior leaflet prolapse of mitral valve with anteriorly directed mitral regurgitation. Severe. Severely dilated left atrium.    Current Meds  Medication Sig  . allopurinol (ZYLOPRIM) 100 MG tablet Take 1 tablet (100 mg total) by mouth 2 (two) times daily.  [name] Kitchen amoxicillin-clavulanate (AUGMENTIN) 875-125 MG tablet Take 1 tablet by mouth every 12 (twelve) hours.  . docusate sodium (COLACE) 100 MG capsule Take 1 capsule (100 mg total) by mouth every 12 (twelve) hours.  . feeding supplement, ENSURE ENLIVE, (ENSURE ENLIVE) LIQD Take 237 mLs by mouth 2 (two) times daily between meals. (Patient taking differently: Take 237 mLs by mouth 2 (two) times daily as needed (nutritional supplement). )  . losartan (COZAAR) 50 MG tablet Take 1 tablet (50 mg total) by mouth daily.  . metoprolol tartrate (LOPRESSOR) 25 MG tablet Take 0.5 tablets (12.5 mg total) by mouth 2 (two) times daily.  . ondansetron (ZOFRAN) 8 MG tablet Take 1 tablet (8 mg total) by mouth every 8 (eight) hours as needed for nausea or vomiting.  [name] Kitchen oxyCODONE (OXY IR/ROXICODONE) 5 MG immediate release tablet Take 1-2 tablets (5-10 mg total) by mouth every 4 (four) hours as needed for severe pain or breakthrough pain.  . pantoprazole (PROTONIX) 20 MG tablet Take 1 tablet (20 mg total) by mouth daily.    . polyethylene glycol (MIRALAX / GLYCOLAX) packet Take 17 g by mouth daily.  . potassium chloride SA (K-DUR,KLOR-CON) 20 MEQ tablet Take 1 tablet (20 mEq total) by mouth 2 (two) times daily.  . predniSONE (DELTASONE) 20 MG tablet Take 3 tablets (60 mg total) by mouth See admin instructions. 60 mg po daily Day 1 to Day 5 of each chemotherapy cycle  . prochlorperazine (COMPAZINE) 10 MG tablet Take 1 tablet (10 mg total) by mouth every 6 (six) hours as needed for nausea or vomiting.  . senna-docusate (SENNA S) 8.6-50 MG tablet Take 2 tablets by mouth 2 (two) times daily. Reduce to 2tab po HS if diarrhea occurs  . sucralfate (CARAFATE) 1 g tablet Take 1 tablet (1 g total) by mouth 4 (four) times daily.    No Known Allergies  Social History   Social History  . Marital status: Legally Separated    Spouse name: N/A  . Number of children: 4  . Years of education: 40   Social History Main Topics  . Smoking status: Never Smoker  . Smokeless tobacco: Never Used  .  Alcohol use No  . Drug use: No  . Sexual activity: Not Asked   Other Topics Concern  . None   Social History Narrative   She lives alone in an apartment.  She relies on family members for transportation & helping her figure out her medications.   She has listed married, but reportedly she is legally separated.   They have 4 children and 5 grandchildren and 5 great-grandchildren.   Has Social Security & DSS assistance with food stamps & food banks.     Family History family history includes Alcohol abuse in her brother; Brain cancer in her father and sister; Depression in her daughter; Heart attack (age of onset: 76) in her mother; Heart disease in her mother; Hypertension in her daughter, father, mother, and sister; Migraines in her daughter; Stroke in her sister; Thyroid disease in her daughter.  Wt Readings from Last 3 Encounters:  09/18/16 101 lb (45.8 kg)  09/11/16 117 lb 8.1 oz (53.3 kg)  09/02/16 109 lb 11.2 oz (49.8  kg)    PHYSICAL EXAM BP 100/70 (BP Location: Left Arm, Patient Position: Sitting, Cuff Size: Normal)   Pulse 70   Ht 4\' 11"  (1.499 m)   Wt 101 lb (45.8 kg)   BMI 20.40 kg/m  General appearance: alert, cooperative, appears older that stated age, no distress.   She is sitting in a wheelchair, minimally responsive to questions, but in no acute distress.   Well-groomed. Thin and frail - borderline cachectic HEENT: [place]/AT, EOMI, MMM, anicteric sclera -  Arcus senilis Neck: no adenopathy, no carotid bruit and no JVD Lungs: clear to auscultation bilaterally, normal percussion bilaterally and non-labored -  Weak effort Heart:  RRR with normal S1and S2.No rubs or gallops. 3-4/6 blowing HSM @ apex & LLSB.  Right chest Port-A-Cath Abdomen: scaphoid. 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 Pulses: 2+ and symmetric radial; diminished pedal pulses bilaterally Skin: thin, frail / dry skin most notable on Bilateral LE (shins mostly) Neurologic: Mental status: Alert & oriented x 3, thought content appropriate; non-focal exam.  Flat mood & affect.  Currently has a walker that she has been using. Also uses a cane at home. Cranial nerves: - grossly intact (but she is not very eager to follow commands).  Quiet / reserved.     Adult ECG Report n/a  Other studies Reviewed: Additional studies/ records that were reviewed today include:  Recent Labs:   Lab Results  Component Value Date   CREATININE 0.76 09/12/2016   BUN 6 09/12/2016   NA 134 (L) 09/12/2016   K 3.4 (L) 09/12/2016   CL 98 (L) 09/12/2016   CO2 29 09/12/2016   CBC Latest Ref Rng & Units 09/12/2016 09/11/2016 09/10/2016  WBC 4.0 - 10.5 K/uL 20.9(H) 19.7(H) 17.7(H)  Hemoglobin 12.0 - 15.0 g/dL 9.6(L) 9.9(L) 9.7(L)  Hematocrit 36.0 - 46.0 % 29.4(L) 30.4(L)  31.1(L)  Platelets 150 - 400 K/uL 287 225 221     ASSESSMENT / PLAN: Problem List Items Addressed This Visit    Abdominal aortic atherosclerosis ([place]) - noted on CT scan (Chronic)    Added risk factor for stroke.      Counseling regarding advanced care planning and goals of care   Essential hypertension (Chronic)    Actually borderline hypotensive. With her poor appetite and likely worsening symptoms with chemotherapy as well  as hyponatremia etc., I would recommend reducing her ARB. For now we will simply recommend reducing losartan to 25 mg daily for 2 days leading up to and 2 days post chemotherapy. If she is nauseated and not hydrating adequately, would also recommend holding losartan.      History of peristent atrial fibrillation (Chronic)    She certainly is a set up for atrial fibrillation with a severely dilated left atrium. With her extent of MR, atrial fibrillation would not be well tolerated. It does not appear to she's had any recurrence since she was sick in the hospital. She is relatively rate controlled now on low-dose Lopressor. With hypotension, I would not titrate further. I would hesitate to use an antiarrhythmic agent based on her other comorbidities. If necessary, amiodarone would probably be the best option - although not great.  She does certainly have risk factors. This patients CHA2DS2-VASc Score and unadjusted Ischemic Stroke Rate (% per year) is equal to 7.2 % stroke rate/year from a score of 5  Above score calculated as 1 point each if present [CHF, HTN, DM, Vascular=MI/PAD/Aortic Plaque, Age if 65-74, or Female] Above score calculated as 2 points each if present [Age > 75, or Stroke/TIA/TE]  ** At this point, with her anemia and other active issues, I am reluctant to start her on anticoagulation as was the case in her hospital stay. I actually would defer to her oncologist to determine risk of bleeding versus the noted risk of stroke.  Would probably consider  using a DOAC, likely low-dose ELIQUIS. -- I would ask her oncologist to please review and help decide.        Mitral valve prolapse -  severe MR (Chronic)    Interesting that she has prolapse of the mitral valve posterior leaflet as opposed anterior leaflet. There was no suggestion of tethering of the chordae which would be associated with ischemic MR.  At this point, I would see the prefer to reassess with an echocardiogram this fall (in October) to determine if the severity was simply related to her ongoing illness. Hopefully at that time we will have a better assessment of her lymphoma. Depending on her status at that time, we could potentially consider TEE at least to fully visualize the valve.      Severe mitral regurgitation: Per 2 D echo 08/23/2016 - Primary (Chronic)    Echocardiogram certainly did say severe MR. Her exam would suggest MR, but she is relatively asymptomatic. She doesn't have any heart failure symptoms. While usually the treatment of choice for mitral regurgitation is after reduction, she is borderline hypotensive. I'm a bit concerned with her getting hypotensive during chemotherapy.  Certainly with severe MR, we need to monitor her for symptoms and would potentially consider referral for surgical repair. However with her ongoing diffuse metastatic lymphoma and protein malnutrition, she would not be considered an adequate candidate at this time. If symptoms do occur, we could potentially consider referral to [facility] for mitral clip procedure, but this would only be if her symptoms worsen. She will likely require a TEE as well as right and left heart catheterization as part of preoperative evaluation. She is not a candidate for invasive evaluation at this time.  We need to see how she does with her lymphoma treatment and nutrition status. Essentially we need to be having goals of care talks prior to considering further evaluation. Otherwise would simply treat medically.       Relevant Orders   ECHOCARDIOGRAM COMPLETE  Severe protein-calorie malnutrition ([facility]) (Chronic)    This is a major factor for risk stratification for any type of valvular procedure.  Realistic goals of care discussions need to be had with the patient but also with family as I'm not overly sure that she understands was happening. She needs to know likelihood of a meaningful recovery from her lymphoma. If indeed this can be cured, then we could potentially consider in the future going forward with mitral valve repair.         Well over 45 minutes spent with the patient and additional 30+ minutes in chart review.  > 50% of patient time was spent in counseling and/or coordination of care and explaining pathophysiology of her condition and plan of care.  Current medicines are reviewed at length with the patient today. (+/- concerns) n/a The following changes have been made: n/a  Patient Instructions  Schedule at [facility]  [address] in Oct 2018 Your physician has requested that you have an echocardiogram. Echocardiography is a painless test that uses sound waves to create images of your heart. It provides your doctor with information about the size and shape of your heart and how well your heart's chambers and valves are working. This procedure takes approximately one hour. There are no restrictions for this procedure.      Next week  2 days before chemo starts reduce Losartan 25 mg daily and 2 days after stopping chemo. But if you are nausea and not eating or drinking do not take losartan at all and restart after chemo.   Will discuss with cancer doctor about starting an medication - new anticoagulation medication     Your physician recommends that you schedule a follow-up appointment in Oct 2018 with DR [facility] AFTER ECHO IS COMPLETED.    Studies Ordered:   Orders Placed This Encounter  Procedures  . ECHOCARDIOGRAM COMPLETE      Glenetta Hew, M.D.,  M.S. Interventional Cardiologist   Pager # [phone] Phone # [phone] [address] [place], [place] [address]

## 2016-09-18 NOTE — Patient Instructions (Signed)
Schedule at [facility]  [address] in Oct 2018 Your physician has requested that you have an echocardiogram. Echocardiography is a painless test that uses sound waves to create images of your heart. It provides your doctor with information about the size and shape of your heart and how well your heart's chambers and valves are working. This procedure takes approximately one hour. There are no restrictions for this procedure.      Next week  2 days before chemo starts reduce Losartan 25 mg daily and 2 days after stopping chemo. But if you are nausea and not eating or drinking do not take losartan at all and restart after chemo.   Will discuss with cancer doctor about starting an medication - new anticoagulation medication     Your physician recommends that you schedule a follow-up appointment in Oct 2018 with DR [facility] AFTER ECHO IS COMPLETED.

## 2016-09-20 ENCOUNTER — Encounter: Payer: Self-pay | Admitting: Cardiology

## 2016-09-20 DIAGNOSIS — I341 Nonrheumatic mitral (valve) prolapse: Secondary | ICD-10-CM

## 2016-09-20 HISTORY — DX: Nonrheumatic mitral (valve) prolapse: I34.1

## 2016-09-20 NOTE — Assessment & Plan Note (Signed)
Actually borderline hypotensive. With her poor appetite and likely worsening symptoms with chemotherapy as well as hyponatremia etc., I would recommend reducing her ARB. For now we will simply recommend reducing losartan to 25 mg daily for 2 days leading up to and 2 days post chemotherapy. If she is nauseated and not hydrating adequately, would also recommend holding losartan.

## 2016-09-20 NOTE — Assessment & Plan Note (Signed)
Interesting that she has prolapse of the mitral valve posterior leaflet as opposed anterior leaflet. There was no suggestion of tethering of the chordae which would be associated with ischemic MR.  At this point, I would see the prefer to reassess with an echocardiogram this fall (in October) to determine if the severity was simply related to her ongoing illness. Hopefully at that time we will have a better assessment of her lymphoma. Depending on her status at that time, we could potentially consider TEE at least to fully visualize the valve.

## 2016-09-20 NOTE — Assessment & Plan Note (Signed)
Added risk factor for stroke.

## 2016-09-20 NOTE — Assessment & Plan Note (Addendum)
She certainly is a set up for atrial fibrillation with a severely dilated left atrium. With her extent of MR, atrial fibrillation would not be well tolerated. It does not appear to she's had any recurrence since she was sick in the hospital. She is relatively rate controlled now on low-dose Lopressor. With hypotension, I would not titrate further. I would hesitate to use an antiarrhythmic agent based on her other comorbidities. If necessary, amiodarone would probably be the best option - although not great.  She does certainly have risk factors. This patients CHA2DS2-VASc Score and unadjusted Ischemic Stroke Rate (% per year) is equal to 7.2 % stroke rate/year from a score of 5  Above score calculated as 1 point each if present [CHF, HTN, DM, Vascular=MI/PAD/Aortic Plaque, Age if 65-74, or Female] Above score calculated as 2 points each if present [Age > 75, or Stroke/TIA/TE]  ** At this point, with her anemia and other active issues, I am reluctant to start her on anticoagulation as was the case in her hospital stay. I actually would defer to her oncologist to determine risk of bleeding versus the noted risk of stroke.  Would probably consider using a DOAC, likely low-dose ELIQUIS. -- I would ask her oncologist to please review and help decide.

## 2016-09-20 NOTE — Assessment & Plan Note (Addendum)
Echocardiogram certainly did say severe MR. Her exam would suggest MR, but she is relatively asymptomatic. She doesn't have any heart failure symptoms. While usually the treatment of choice for mitral regurgitation is after reduction, she is borderline hypotensive. I'm a bit concerned with her getting hypotensive during chemotherapy.  Certainly with severe MR, we need to monitor her for symptoms and would potentially consider referral for surgical repair. However with her ongoing diffuse metastatic lymphoma and protein malnutrition, she would not be considered an adequate candidate at this time. If symptoms do occur, we could potentially consider referral to [facility] for mitral clip procedure, but this would only be if her symptoms worsen. She will likely require a TEE as well as right and left heart catheterization as part of preoperative evaluation. She is not a candidate for invasive evaluation at this time.  We need to see how she does with her lymphoma treatment and nutrition status. Essentially we need to be having goals of care talks prior to considering further evaluation. Otherwise would simply treat medically.

## 2016-09-20 NOTE — Assessment & Plan Note (Signed)
This is a major factor for risk stratification for any type of valvular procedure.  Realistic goals of care discussions need to be had with the patient but also with family as I'm not overly sure that she understands was happening. She needs to know likelihood of a meaningful recovery from her lymphoma. If indeed this can be cured, then we could potentially consider in the future going forward with mitral valve repair.

## 2016-09-24 ENCOUNTER — Other Ambulatory Visit: Payer: Medicare Other

## 2016-09-25 ENCOUNTER — Telehealth: Payer: Self-pay | Admitting: Hematology

## 2016-09-25 ENCOUNTER — Other Ambulatory Visit: Payer: Self-pay | Admitting: *Deleted

## 2016-09-25 NOTE — Telephone Encounter (Signed)
Sch appt per 6/13 sch message from [facility] - patient did not answer - will be here tomorrow - to get new schedule tomorrow.

## 2016-09-26 ENCOUNTER — Other Ambulatory Visit (HOSPITAL_BASED_OUTPATIENT_CLINIC_OR_DEPARTMENT_OTHER): Payer: Medicare Other

## 2016-09-26 ENCOUNTER — Ambulatory Visit (HOSPITAL_BASED_OUTPATIENT_CLINIC_OR_DEPARTMENT_OTHER): Payer: Medicare Other

## 2016-09-26 ENCOUNTER — Encounter: Payer: Self-pay | Admitting: Hematology

## 2016-09-26 ENCOUNTER — Ambulatory Visit (HOSPITAL_BASED_OUTPATIENT_CLINIC_OR_DEPARTMENT_OTHER): Payer: Medicare Other | Admitting: Hematology

## 2016-09-26 ENCOUNTER — Telehealth: Payer: Self-pay | Admitting: Hematology

## 2016-09-26 VITALS — BP 100/56 | HR 65 | Temp 97.8°F | Resp 18 | Ht 59.0 in | Wt 100.8 lb

## 2016-09-26 DIAGNOSIS — G893 Neoplasm related pain (acute) (chronic): Secondary | ICD-10-CM | POA: Diagnosis not present

## 2016-09-26 DIAGNOSIS — R630 Anorexia: Secondary | ICD-10-CM

## 2016-09-26 DIAGNOSIS — C8338 Diffuse large B-cell lymphoma, lymph nodes of multiple sites: Secondary | ICD-10-CM

## 2016-09-26 DIAGNOSIS — I340 Nonrheumatic mitral (valve) insufficiency: Secondary | ICD-10-CM | POA: Diagnosis not present

## 2016-09-26 DIAGNOSIS — Z5111 Encounter for antineoplastic chemotherapy: Secondary | ICD-10-CM

## 2016-09-26 DIAGNOSIS — R5383 Other fatigue: Secondary | ICD-10-CM | POA: Diagnosis not present

## 2016-09-26 DIAGNOSIS — Z5112 Encounter for antineoplastic immunotherapy: Secondary | ICD-10-CM | POA: Diagnosis not present

## 2016-09-26 DIAGNOSIS — R627 Adult failure to thrive: Secondary | ICD-10-CM

## 2016-09-26 DIAGNOSIS — N289 Disorder of kidney and ureter, unspecified: Secondary | ICD-10-CM

## 2016-09-26 DIAGNOSIS — E43 Unspecified severe protein-calorie malnutrition: Secondary | ICD-10-CM

## 2016-09-26 LAB — CBC & DIFF AND RETIC
BASO%: 0.4 % (ref 0.0–2.0)
Basophils Absolute: 0 10e3/uL (ref 0.0–0.1)
EOS%: 5.2 % (ref 0.0–7.0)
Eosinophils Absolute: 0.3 10e3/uL (ref 0.0–0.5)
HCT: 31.7 % — ABNORMAL LOW (ref 34.8–46.6)
HGB: 9.9 g/dL — ABNORMAL LOW (ref 11.6–15.9)
Immature Retic Fract: 5.6 % (ref 1.60–10.00)
LYMPH%: 12.9 % — ABNORMAL LOW (ref 14.0–49.7)
MCH: 28 pg (ref 25.1–34.0)
MCHC: 31.2 g/dL — ABNORMAL LOW (ref 31.5–36.0)
MCV: 89.5 fL (ref 79.5–101.0)
MONO#: 0.5 10e3/uL (ref 0.1–0.9)
MONO%: 8.8 % (ref 0.0–14.0)
NEUT#: 4.1 10e3/uL (ref 1.5–6.5)
NEUT%: 72.7 % (ref 38.4–76.8)
Platelets: 169 10e3/uL (ref 145–400)
RBC: 3.54 10e6/uL — ABNORMAL LOW (ref 3.70–5.45)
RDW: 16.6 % — ABNORMAL HIGH (ref 11.2–14.5)
Retic %: 1.45 % (ref 0.70–2.10)
Retic Ct Abs: 51.33 10e3/uL (ref 33.70–90.70)
WBC: 5.6 10e3/uL (ref 3.9–10.3)
lymph#: 0.7 10e3/uL — ABNORMAL LOW (ref 0.9–3.3)

## 2016-09-26 LAB — COMPREHENSIVE METABOLIC PANEL
ALBUMIN: 3.2 g/dL — AB (ref 3.5–5.0)
ALK PHOS: 83 U/L (ref 40–150)
ALT: 8 U/L (ref 0–55)
AST: 18 U/L (ref 5–34)
Anion Gap: 9 mEq/L (ref 3–11)
BILIRUBIN TOTAL: 0.79 mg/dL (ref 0.20–1.20)
BUN: 7.3 mg/dL (ref 7.0–26.0)
CALCIUM: 9.7 mg/dL (ref 8.4–10.4)
CO2: 27 mEq/L (ref 22–29)
CREATININE: 0.7 mg/dL (ref 0.6–1.1)
Chloride: 102 mEq/L (ref 98–109)
EGFR: >90 mL/min/{1.73_m2} (ref >90)
GLUCOSE: 94 mg/dL (ref 70–140)
Potassium: 4.5 mEq/L (ref 3.5–5.1)
SODIUM: 138 meq/L (ref 136–145)
TOTAL PROTEIN: 5.9 g/dL — AB (ref 6.4–8.3)

## 2016-09-26 LAB — LACTATE DEHYDROGENASE: LDH: 242 U/L (ref 125–245)

## 2016-09-26 LAB — MAGNESIUM: MAGNESIUM: 0.9 mg/dL — AB (ref 1.5–2.5)

## 2016-09-26 MED ORDER — DEXAMETHASONE SODIUM PHOSPHATE 10 MG/ML IJ SOLN
INTRAMUSCULAR | Status: AC
  Filled 2016-09-26: qty 1

## 2016-09-26 MED ORDER — MAGNESIUM OXIDE 400 (241.3 MG) MG PO TABS: 400.0000 mg | ORAL_TABLET | Freq: Two times a day (BID) | ORAL | 0 refills | Status: DC

## 2016-09-26 MED ORDER — SODIUM CHLORIDE 0.9 % IV SOLN
375.0000 mg/m2 | Freq: Once | INTRAVENOUS | Status: AC
  Administered 2016-09-26: 500 mg via INTRAVENOUS
  Filled 2016-09-26: qty 50

## 2016-09-26 MED ORDER — SODIUM CHLORIDE 0.9 % IV SOLN
Freq: Once | INTRAVENOUS | Status: AC
  Administered 2016-09-26: 10:00:00 via INTRAVENOUS

## 2016-09-26 MED ORDER — ACETAMINOPHEN 325 MG PO TABS
ORAL_TABLET | ORAL | Status: AC
  Filled 2016-09-26: qty 2

## 2016-09-26 MED ORDER — SODIUM CHLORIDE 0.9 % IV SOLN
2.0000 g | Freq: Once | INTRAVENOUS | Status: AC
  Administered 2016-09-26: 2 g via INTRAVENOUS
  Filled 2016-09-26: qty 4

## 2016-09-26 MED ORDER — HEPARIN SOD (PORK) LOCK FLUSH 100 UNIT/ML IV SOLN
500.0000 [IU] | Freq: Once | INTRAVENOUS | Status: AC | PRN
  Administered 2016-09-26: 500 [IU]
  Filled 2016-09-26: qty 5

## 2016-09-26 MED ORDER — SODIUM CHLORIDE 0.9% FLUSH
10.0000 mL | INTRAVENOUS | Status: DC | PRN
  Administered 2016-09-26: 10 mL
  Filled 2016-09-26: qty 10

## 2016-09-26 MED ORDER — DOXORUBICIN HCL CHEMO IV INJECTION 2 MG/ML
25.0000 mg/m2 | Freq: Once | INTRAVENOUS | Status: AC
  Administered 2016-09-26: 36 mg via INTRAVENOUS
  Filled 2016-09-26: qty 18

## 2016-09-26 MED ORDER — ACETAMINOPHEN 325 MG PO TABS
650.0000 mg | ORAL_TABLET | Freq: Once | ORAL | Status: AC
  Administered 2016-09-26: 650 mg via ORAL

## 2016-09-26 MED ORDER — DIPHENHYDRAMINE HCL 25 MG PO CAPS
ORAL_CAPSULE | ORAL | Status: AC
  Filled 2016-09-26: qty 2

## 2016-09-26 MED ORDER — PALONOSETRON HCL INJECTION 0.25 MG/5ML
0.2500 mg | Freq: Once | INTRAVENOUS | Status: AC
  Administered 2016-09-26: 0.25 mg via INTRAVENOUS

## 2016-09-26 MED ORDER — DIPHENHYDRAMINE HCL 25 MG PO CAPS
50.0000 mg | ORAL_CAPSULE | Freq: Once | ORAL | Status: AC
  Administered 2016-09-26: 50 mg via ORAL

## 2016-09-26 MED ORDER — SODIUM CHLORIDE 0.9 % IV SOLN
1.0000 mg | Freq: Once | INTRAVENOUS | Status: AC
  Administered 2016-09-26: 1 mg via INTRAVENOUS
  Filled 2016-09-26: qty 1

## 2016-09-26 MED ORDER — DRONABINOL 2.5 MG PO CAPS: 2.5000 mg | ORAL_CAPSULE | Freq: Two times a day (BID) | ORAL | 0 refills | Status: DC

## 2016-09-26 MED ORDER — PALONOSETRON HCL INJECTION 0.25 MG/5ML
INTRAVENOUS | Status: AC
  Filled 2016-09-26: qty 5

## 2016-09-26 MED ORDER — DEXAMETHASONE SODIUM PHOSPHATE 10 MG/ML IJ SOLN
10.0000 mg | Freq: Once | INTRAMUSCULAR | Status: AC
  Administered 2016-09-26: 10 mg via INTRAVENOUS

## 2016-09-26 MED ORDER — CYCLOPHOSPHAMIDE CHEMO INJECTION 1 GM
500.0000 mg/m2 | Freq: Once | INTRAMUSCULAR | Status: AC
  Administered 2016-09-26: 720 mg via INTRAVENOUS
  Filled 2016-09-26: qty 36

## 2016-09-26 NOTE — Patient Instructions (Signed)
Thank you for choosing [facility] to provide your oncology and hematology care.  To afford each patient quality time with our providers, please arrive 30 minutes before your scheduled appointment time.  If you arrive late for your appointment, you may be asked to reschedule.  We strive to give you quality time with our providers, and arriving late affects you and other patients whose appointments are after yours.  If you are a no show for multiple scheduled visits, you may be dismissed from the clinic at the providers discretion.   Again, thank you for choosing [facility], our hope is that these requests will decrease the amount of time that you wait before being seen by our physicians.  ______________________________________________________________________ Should you have questions after your visit to the [facility], please contact our office at [phone] between the hours of 8:30 and 4:30 p.m.    Voicemails left after 4:30p.m will not be returned until the following business day.   For prescription refill requests, please have your pharmacy contact us directly.  Please also try to allow 48 hours for prescription requests.   Please contact the scheduling department for questions regarding scheduling.  For scheduling of procedures such as PET scans, CT scans, MRI, Ultrasound, etc please contact central scheduling at [phone].   Resources For Cancer Patients and Caregivers:  American Cancer Society:  800-227-2345  Can help patients locate various types of support and financial assistance Cancer Care: 1-800-813-HOPE (4673) Provides financial assistance, online support groups, medication/co-pay assistance.   [place] DSS:  336-641-3447 Where to apply for food stamps, Medicaid, and utility assistance Medicare Rights Center: 800-333-4114 Helps people with Medicare understand their rights and benefits, navigate the Medicare system, and secure the  quality healthcare they deserve SCAT: 336-333-6589 [place] [employer]'s shared-ride transportation service for eligible riders who have a disability that prevents them from riding the fixed route bus.   For additional information on assistance programs please contact our social worker:   Grier Hock/Abigail Elmore:  [phone] 

## 2016-09-26 NOTE — Patient Instructions (Signed)
[facility] Discharge Instructions for Patients Receiving Chemotherapy  Today you received the following chemotherapy agents Adriamycin,Vincristin, Cytoxan, Rituxan  To help prevent nausea and vomiting after your treatment, we encourage you to take your nausea medication    If you develop nausea and vomiting that is not controlled by your nausea medication, call the clinic.   BELOW ARE SYMPTOMS THAT SHOULD BE REPORTED IMMEDIATELY:  *FEVER GREATER THAN 100.5 F  *CHILLS WITH OR WITHOUT FEVER  NAUSEA AND VOMITING THAT IS NOT CONTROLLED WITH YOUR NAUSEA MEDICATION  *UNUSUAL SHORTNESS OF BREATH  *UNUSUAL BRUISING OR BLEEDING  TENDERNESS IN MOUTH AND THROAT WITH OR WITHOUT PRESENCE OF ULCERS  *URINARY PROBLEMS  *BOWEL PROBLEMS  UNUSUAL RASH Items with * indicate a potential emergency and should be followed up as soon as possible.  Feel free to call the clinic you have any questions or concerns. The clinic phone number is [phone].  Please show the [place] at check-in to the Emergency Department and triage nurse.   Magnesium Sulfate injection What is this medicine? MAGNESIUM SULFATE (mag NEE zee um SUL fate) is an electrolyte injection commonly used to treat low magnesium levels in your blood. It is also used to prevent or control seizures in women with preeclampsia or eclampsia. This medicine may be used for other purposes; ask your health care provider or pharmacist if you have questions. What should I tell my health care provider before I take this medicine? They need to know if you have any of these conditions: -heart disease -history of irregular heart beat -kidney disease -an unusual or allergic reaction to magnesium sulfate, medicines, foods, dyes, or preservatives -pregnant or trying to get pregnant -breast-feeding How should I use this medicine? This medicine is for infusion into a vein. It is given by a health care professional in a  hospital or clinic setting. Talk to your pediatrician regarding the use of this medicine in children. While this drug may be prescribed for selected conditions, precautions do apply. Overdosage: If you think you have taken too much of this medicine contact a poison control center or emergency room at once. NOTE: This medicine is only for you. Do not share this medicine with others. What if I miss a dose? This does not apply. What may interact with this medicine? This medicine may interact with the following medications: -certain medicines for anxiety or sleep -certain medicines for seizures like phenobarbital -digoxin -medicines that relax muscles for surgery -narcotic medicines for pain This list may not describe all possible interactions. Give your health care provider a list of all the medicines, herbs, non-prescription drugs, or dietary supplements you use. Also tell them if you smoke, drink alcohol, or use illegal drugs. Some items may interact with your medicine. What should I watch for while using this medicine? Your condition will be monitored carefully while you are receiving this medicine. You may need blood work done while you are receiving this medicine. What side effects may I notice from receiving this medicine? Side effects that you should report to your doctor or health care professional as soon as possible: -allergic reactions like skin rash, itching or hives, swelling of the face, lips, or tongue -facial flushing -muscle weakness -signs and symptoms of low blood pressure like dizziness; feeling faint or lightheaded, falls; unusually weak or tired -signs and symptoms of a dangerous change in heartbeat or heart rhythm like chest pain; dizziness; fast or irregular heartbeat; palpitations; breathing problems -sweating This list may  not describe all possible side effects. Call your doctor for medical advice about side effects. You may report side effects to FDA at  1-800-FDA-1088. Where should I keep my medicine? This drug is given in a hospital or clinic and will not be stored at home. NOTE: This sheet is a summary. It may not cover all possible information. If you have questions about this medicine, talk to your doctor, pharmacist, or health care provider.  2018 Elsevier/Gold Standard (2015-10-18 12:31:42)

## 2016-09-26 NOTE — Telephone Encounter (Signed)
Scheduled appt per 6/14 los - Gave patient AVS and calender.  

## 2016-09-28 ENCOUNTER — Ambulatory Visit (HOSPITAL_BASED_OUTPATIENT_CLINIC_OR_DEPARTMENT_OTHER): Payer: Medicare Other

## 2016-09-28 DIAGNOSIS — Z5189 Encounter for other specified aftercare: Secondary | ICD-10-CM | POA: Diagnosis not present

## 2016-09-28 DIAGNOSIS — C8338 Diffuse large B-cell lymphoma, lymph nodes of multiple sites: Secondary | ICD-10-CM

## 2016-09-28 MED ORDER — PEGFILGRASTIM INJECTION 6 MG/0.6ML ~~LOC~~
6.0000 mg | PREFILLED_SYRINGE | Freq: Once | SUBCUTANEOUS | Status: AC
  Administered 2016-09-28: 6 mg via SUBCUTANEOUS

## 2016-09-28 NOTE — Patient Instructions (Signed)
Pegfilgrastim injection What is this medicine? PEGFILGRASTIM (PEG fil gra stim) is a long-acting granulocyte colony-stimulating factor that stimulates the growth of neutrophils, a type of white blood cell important in the body's fight against infection. It is used to reduce the incidence of fever and infection in patients with certain types of cancer who are receiving chemotherapy that affects the bone marrow, and to increase survival after being exposed to high doses of radiation. This medicine may be used for other purposes; ask your health care provider or pharmacist if you have questions. COMMON BRAND NAME(S): Neulasta What should I tell my health care provider before I take this medicine? They need to know if you have any of these conditions: -kidney disease -latex allergy -ongoing radiation therapy -sickle cell disease -skin reactions to acrylic adhesives (On-Body Injector only) -an unusual or allergic reaction to pegfilgrastim, filgrastim, other medicines, foods, dyes, or preservatives -pregnant or trying to get pregnant -breast-feeding How should I use this medicine? This medicine is for injection under the skin. If you get this medicine at home, you will be taught how to prepare and give the pre-filled syringe or how to use the On-body Injector. Refer to the patient Instructions for Use for detailed instructions. Use exactly as directed. Tell your healthcare provider immediately if you suspect that the On-body Injector may not have performed as intended or if you suspect the use of the On-body Injector resulted in a missed or partial dose. It is important that you put your used needles and syringes in a special sharps container. Do not put them in a trash can. If you do not have a sharps container, call your pharmacist or healthcare provider to get one. Talk to your pediatrician regarding the use of this medicine in children. While this drug may be prescribed for selected conditions,  precautions do apply. Overdosage: If you think you have taken too much of this medicine contact a poison control center or emergency room at once. NOTE: This medicine is only for you. Do not share this medicine with others. What if I miss a dose? It is important not to miss your dose. Call your doctor or health care professional if you miss your dose. If you miss a dose due to an On-body Injector failure or leakage, a new dose should be administered as soon as possible using a single prefilled syringe for manual use. What may interact with this medicine? Interactions have not been studied. Give your health care provider a list of all the medicines, herbs, non-prescription drugs, or dietary supplements you use. Also tell them if you smoke, drink alcohol, or use illegal drugs. Some items may interact with your medicine. This list may not describe all possible interactions. Give your health care provider a list of all the medicines, herbs, non-prescription drugs, or dietary supplements you use. Also tell them if you smoke, drink alcohol, or use illegal drugs. Some items may interact with your medicine. What should I watch for while using this medicine? You may need blood work done while you are taking this medicine. If you are going to need a MRI, CT scan, or other procedure, tell your doctor that you are using this medicine (On-Body Injector only). What side effects may I notice from receiving this medicine? Side effects that you should report to your doctor or health care professional as soon as possible: -allergic reactions like skin rash, itching or hives, swelling of the face, lips, or tongue -dizziness -fever -pain, redness, or irritation at site   where injected -pinpoint red spots on the skin -red or dark-brown urine -shortness of breath or breathing problems -stomach or side pain, or pain at the shoulder -swelling -tiredness -trouble passing urine or change in the amount of urine Side  effects that usually do not require medical attention (report to your doctor or health care professional if they continue or are bothersome): -bone pain -muscle pain This list may not describe all possible side effects. Call your doctor for medical advice about side effects. You may report side effects to FDA at 1-800-FDA-1088. Where should I keep my medicine? Keep out of the reach of children. Store pre-filled syringes in a refrigerator between 2 and 8 degrees C (36 and 46 degrees F). Do not freeze. Keep in carton to protect from light. Throw away this medicine if it is left out of the refrigerator for more than 48 hours. Throw away any unused medicine after the expiration date. NOTE: This sheet is a summary. It may not cover all possible information. If you have questions about this medicine, talk to your doctor, pharmacist, or health care provider.  2018 [employer] (2016-03-28 12:58:03)  

## 2016-10-01 ENCOUNTER — Encounter: Payer: Medicare Other | Admitting: Nutrition

## 2016-10-07 ENCOUNTER — Encounter: Payer: Self-pay | Admitting: Hematology

## 2016-10-07 ENCOUNTER — Ambulatory Visit (HOSPITAL_BASED_OUTPATIENT_CLINIC_OR_DEPARTMENT_OTHER): Payer: Medicare Other | Admitting: Hematology

## 2016-10-07 ENCOUNTER — Telehealth: Payer: Self-pay | Admitting: Hematology

## 2016-10-07 ENCOUNTER — Ambulatory Visit (HOSPITAL_BASED_OUTPATIENT_CLINIC_OR_DEPARTMENT_OTHER): Payer: Medicare Other

## 2016-10-07 ENCOUNTER — Other Ambulatory Visit (HOSPITAL_BASED_OUTPATIENT_CLINIC_OR_DEPARTMENT_OTHER): Payer: Medicare Other

## 2016-10-07 VITALS — BP 101/44 | HR 87 | Temp 98.7°F | Resp 16 | Ht 59.0 in | Wt 101.7 lb

## 2016-10-07 DIAGNOSIS — C8338 Diffuse large B-cell lymphoma, lymph nodes of multiple sites: Secondary | ICD-10-CM

## 2016-10-07 DIAGNOSIS — E43 Unspecified severe protein-calorie malnutrition: Secondary | ICD-10-CM

## 2016-10-07 DIAGNOSIS — E860 Dehydration: Secondary | ICD-10-CM

## 2016-10-07 LAB — CBC & DIFF AND RETIC
BASO%: 0.3 % (ref 0.0–2.0)
Basophils Absolute: 0.1 10*3/uL (ref 0.0–0.1)
EOS ABS: 0.3 10*3/uL (ref 0.0–0.5)
EOS%: 1.8 % (ref 0.0–7.0)
HCT: 32.9 % — ABNORMAL LOW (ref 34.8–46.6)
HGB: 10.2 g/dL — ABNORMAL LOW (ref 11.6–15.9)
IMMATURE RETIC FRACT: 17.3 % — AB (ref 1.60–10.00)
LYMPH%: 7 % — AB (ref 14.0–49.7)
MCH: 27.8 pg (ref 25.1–34.0)
MCHC: 31 g/dL — AB (ref 31.5–36.0)
MCV: 89.6 fL (ref 79.5–101.0)
MONO#: 0.8 10*3/uL (ref 0.1–0.9)
MONO%: 4.8 % (ref 0.0–14.0)
NEUT%: 86.1 % — ABNORMAL HIGH (ref 38.4–76.8)
NEUTROS ABS: 13.8 10*3/uL — AB (ref 1.5–6.5)
NRBC: 0 % (ref 0–0)
PLATELETS: 195 10*3/uL (ref 145–400)
RBC: 3.67 10*6/uL — AB (ref 3.70–5.45)
RDW: 17.9 % — AB (ref 11.2–14.5)
Retic %: 3.78 % — ABNORMAL HIGH (ref 0.70–2.10)
Retic Ct Abs: 138.73 10*3/uL — ABNORMAL HIGH (ref 33.70–90.70)
WBC: 16 10*3/uL — AB (ref 3.9–10.3)
lymph#: 1.1 10*3/uL (ref 0.9–3.3)

## 2016-10-07 LAB — MAGNESIUM: Magnesium: 1.4 mg/dl — CL (ref 1.5–2.5)

## 2016-10-07 LAB — COMPREHENSIVE METABOLIC PANEL
ALT: 9 U/L (ref 0–55)
ANION GAP: 10 meq/L (ref 3–11)
AST: 14 U/L (ref 5–34)
Albumin: 3.3 g/dL — ABNORMAL LOW (ref 3.5–5.0)
Alkaline Phosphatase: 106 U/L (ref 40–150)
BUN: 7.1 mg/dL (ref 7.0–26.0)
CHLORIDE: 103 meq/L (ref 98–109)
CO2: 29 meq/L (ref 22–29)
Calcium: 9.7 mg/dL (ref 8.4–10.4)
Creatinine: 0.8 mg/dL (ref 0.6–1.1)
EGFR: 85 mL/min/{1.73_m2} — AB (ref >90)
GLUCOSE: 100 mg/dL (ref 70–140)
POTASSIUM: 4.6 meq/L (ref 3.5–5.1)
SODIUM: 142 meq/L (ref 136–145)
TOTAL PROTEIN: 5.6 g/dL — AB (ref 6.4–8.3)
Total Bilirubin: 0.55 mg/dL (ref 0.20–1.20)

## 2016-10-07 MED ORDER — SODIUM CHLORIDE 0.9 % IV SOLN
INTRAVENOUS | Status: AC
  Administered 2016-10-07: 12:00:00 via INTRAVENOUS

## 2016-10-07 MED ORDER — MAGNESIUM SULFATE 2 GM/50ML IV SOLN
2.0000 g | Freq: Once | INTRAVENOUS | Status: AC
  Administered 2016-10-07: 2 g via INTRAVENOUS
  Filled 2016-10-07: qty 50

## 2016-10-07 MED ORDER — HEPARIN SOD (PORK) LOCK FLUSH 100 UNIT/ML IV SOLN
500.0000 [IU] | Freq: Once | INTRAVENOUS | Status: AC
  Administered 2016-10-07: 500 [IU] via INTRAVENOUS
  Filled 2016-10-07: qty 5

## 2016-10-07 MED ORDER — SODIUM CHLORIDE 0.9% FLUSH
10.0000 mL | Freq: Once | INTRAVENOUS | Status: AC
  Administered 2016-10-07: 10 mL via INTRAVENOUS
  Filled 2016-10-07: qty 10

## 2016-10-07 MED ORDER — SODIUM CHLORIDE 0.9 % IV SOLN: 2.0000 g | Freq: Once | INTRAVENOUS | Status: DC

## 2016-10-07 NOTE — Patient Instructions (Signed)
Thank you for choosing [facility] to provide your oncology and hematology care.  To afford each patient quality time with our providers, please arrive 30 minutes before your scheduled appointment time.  If you arrive late for your appointment, you may be asked to reschedule.  We strive to give you quality time with our providers, and arriving late affects you and other patients whose appointments are after yours.  If you are a no show for multiple scheduled visits, you may be dismissed from the clinic at the providers discretion.   Again, thank you for choosing [facility], our hope is that these requests will decrease the amount of time that you wait before being seen by our physicians.  ______________________________________________________________________ Should you have questions after your visit to the [facility], please contact our office at [phone] between the hours of 8:30 and 4:30 p.m.    Voicemails left after 4:30p.m will not be returned until the following business day.   For prescription refill requests, please have your pharmacy contact us directly.  Please also try to allow 48 hours for prescription requests.   Please contact the scheduling department for questions regarding scheduling.  For scheduling of procedures such as PET scans, CT scans, MRI, Ultrasound, etc please contact central scheduling at [phone].   Resources For Cancer Patients and Caregivers:  American Cancer Society:  800-227-2345  Can help patients locate various types of support and financial assistance Cancer Care: 1-800-813-HOPE (4673) Provides financial assistance, online support groups, medication/co-pay assistance.   [place] DSS:  336-641-3447 Where to apply for food stamps, Medicaid, and utility assistance Medicare Rights Center: 800-333-4114 Helps people with Medicare understand their rights and benefits, navigate the Medicare system, and secure the  quality healthcare they deserve SCAT: 336-333-6589 [employer]'s shared-ride transportation service for eligible riders who have a disability that prevents them from riding the fixed route bus.   For additional information on assistance programs please contact our social worker:   Grier Hock/Abigail Elmore:  [phone] 

## 2016-10-07 NOTE — Telephone Encounter (Signed)
Scheduled appt per 6/25 los- gave patient AVS and calender per los 

## 2016-10-07 NOTE — Patient Instructions (Signed)
Dehydration, Adult Dehydration is a condition in which there is not enough fluid or water in the body. This happens when you lose more fluids than you take in. Important organs, such as the kidneys, brain, and heart, cannot function without a proper amount of fluids. Any loss of fluids from the body can lead to dehydration. Dehydration can range from mild to severe. This condition should be treated right away to prevent it from becoming severe. What are the causes? This condition may be caused by:  Vomiting.  Diarrhea.  Excessive sweating, such as from heat exposure or exercise.  Not drinking enough fluid, especially: ? When ill. ? While doing activity that requires a lot of energy.  Excessive urination.  Fever.  Infection.  Certain medicines, such as medicines that cause the body to lose excess fluid (diuretics).  Inability to access safe drinking water.  Reduced physical ability to get adequate water and food.  What increases the risk? This condition is more likely to develop in people:  Who have a poorly controlled long-term (chronic) illness, such as diabetes, heart disease, or kidney disease.  Who are age 65 or older.  Who are disabled.  Who live in a place with high altitude.  Who play endurance sports.  What are the signs or symptoms? Symptoms of mild dehydration may include:  Thirst.  Dry lips.  Slightly dry mouth.  Dry, warm skin.  Dizziness. Symptoms of moderate dehydration may include:  Very dry mouth.  Muscle cramps.  Dark urine. Urine may be the color of tea.  Decreased urine production.  Decreased tear production.  Heartbeat that is irregular or faster than normal (palpitations).  Headache.  Light-headedness, especially when you stand up from a sitting position.  Fainting (syncope). Symptoms of severe dehydration may include:  Changes in skin, such as: ? Cold and clammy skin. ? Blotchy (mottled) or pale skin. ? Skin that does  not quickly return to normal after being lightly pinched and released (poor skin turgor).  Changes in body fluids, such as: ? Extreme thirst. ? No tear production. ? Inability to sweat when body temperature is high, such as in hot weather. ? Very little urine production.  Changes in vital signs, such as: ? Weak pulse. ? Pulse that is more than 100 beats a minute when sitting still. ? Rapid breathing. ? Low blood pressure.  Other changes, such as: ? Sunken eyes. ? Cold hands and feet. ? Confusion. ? Lack of energy (lethargy). ? Difficulty waking up from sleep. ? Short-term weight loss. ? Unconsciousness. How is this diagnosed? This condition is diagnosed based on your symptoms and a physical exam. Blood and urine tests may be done to help confirm the diagnosis. How is this treated? Treatment for this condition depends on the severity. Mild or moderate dehydration can often be treated at home. Treatment should be started right away. Do not wait until dehydration becomes severe. Severe dehydration is an emergency and it needs to be treated in a hospital. Treatment for mild dehydration may include:  Drinking more fluids.  Replacing salts and minerals in your blood (electrolytes) that you may have lost. Treatment for moderate dehydration may include:  Drinking an oral rehydration solution (ORS). This is a drink that helps you replace fluids and electrolytes (rehydrate). It can be found at pharmacies and retail stores. Treatment for severe dehydration may include:  Receiving fluids through an IV tube.  Receiving an electrolyte solution through a feeding tube that is passed through your nose   and into your stomach (nasogastric tube, or NG tube).  Correcting any abnormalities in electrolytes.  Treating the underlying cause of dehydration. Follow these instructions at home:  If directed by your health care provider, drink an ORS: ? Make an ORS by following instructions on the  package. ? Start by drinking small amounts, about  cup (120 mL) every 5-10 minutes. ? Slowly increase how much you drink until you have taken the amount recommended by your health care provider.  Drink enough clear fluid to keep your urine clear or pale yellow. If you were told to drink an ORS, finish the ORS first, then start slowly drinking other clear fluids. Drink fluids such as: ? Water. Do not drink only water. Doing that can lead to having too little salt (sodium) in the body (hyponatremia). ? Ice chips. ? Fruit juice that you have added water to (diluted fruit juice). ? Low-calorie sports drinks.  Avoid: ? Alcohol. ? Drinks that contain a lot of sugar. These include high-calorie sports drinks, fruit juice that is not diluted, and soda. ? Caffeine. ? Foods that are greasy or contain a lot of fat or sugar.  Take over-the-counter and prescription medicines only as told by your health care provider.  Do not take sodium tablets. This can lead to having too much sodium in the body (hypernatremia).  Eat foods that contain a healthy balance of electrolytes, such as bananas, oranges, potatoes, tomatoes, and spinach.  Keep all follow-up visits as told by your health care provider. This is important. Contact a health care provider if:  You have abdominal pain that: ? Gets worse. ? Stays in one area (localizes).  You have a rash.  You have a stiff neck.  You are more irritable than usual.  You are sleepier or more difficult to wake up than usual.  You feel weak or dizzy.  You feel very thirsty.  You have urinated only a small amount of very dark urine over 6-8 hours. Get help right away if:  You have symptoms of severe dehydration.  You cannot drink fluids without vomiting.  Your symptoms get worse with treatment.  You have a fever.  You have a severe headache.  You have vomiting or diarrhea that: ? Gets worse. ? Does not go away.  You have blood or green matter  (bile) in your vomit.  You have blood in your stool. This may cause stool to look black and tarry.  You have not urinated in 6-8 hours.  You faint.  Your heart rate while sitting still is over 100 beats a minute.  You have trouble breathing. This information is not intended to replace advice given to you by your health care provider. Make sure you discuss any questions you have with your health care provider. Document Released: 04/01/2005 Document Revised: 10/27/2015 Document Reviewed: 05/26/2015 Elsevier Interactive Patient Education  2018 Elsevier Inc.  

## 2016-10-07 NOTE — Progress Notes (Signed)
Talked w/ pts granddaughter regarding the price pt is having to pay for Marinol.  After talking w/ [facility] that drug needs prior auth so I initiated that today.  Ins faxed me clinical questions which I gave to the nurse to complete.  Once received I will fax to ins to complete processing.

## 2016-10-08 ENCOUNTER — Telehealth: Payer: Self-pay

## 2016-10-08 ENCOUNTER — Encounter: Payer: Self-pay | Admitting: Hematology

## 2016-10-08 NOTE — Progress Notes (Signed)
Pt's Dronabinol (Marinol) has been approved through 04/09/17.  Notified pt's granddaughter.

## 2016-10-08 NOTE — Telephone Encounter (Signed)
Dr Theodis Aguas family physician was wanting to make sure Dr Irene Limbo was aware that Dr Ellyn Hack, cardiologist was deferring to Dr Irene Limbo about anticoagulation. Dr Allison Quarry note is visible in epic.

## 2016-10-09 ENCOUNTER — Telehealth: Payer: Self-pay | Admitting: *Deleted

## 2016-10-09 ENCOUNTER — Telehealth: Payer: Self-pay

## 2016-10-09 NOTE — Telephone Encounter (Signed)
Telephone call from OT regarding allopurinol. Dr. Irene Limbo said that this medication has been completed and the pt does not need to be reordered. Message left.

## 2016-10-09 NOTE — Telephone Encounter (Signed)
Tommi Emery OT with [facility] [phone]).  This patient is out of allopurinol.  Should she continue this or is it discontinued.  Needs a refill if needs to continue."

## 2016-10-18 ENCOUNTER — Other Ambulatory Visit: Payer: Self-pay | Admitting: *Deleted

## 2016-10-18 ENCOUNTER — Other Ambulatory Visit: Payer: Self-pay | Admitting: Hematology

## 2016-10-18 DIAGNOSIS — C8338 Diffuse large B-cell lymphoma, lymph nodes of multiple sites: Secondary | ICD-10-CM

## 2016-10-21 ENCOUNTER — Other Ambulatory Visit: Payer: Self-pay

## 2016-10-21 ENCOUNTER — Other Ambulatory Visit (HOSPITAL_BASED_OUTPATIENT_CLINIC_OR_DEPARTMENT_OTHER): Payer: Medicare Other

## 2016-10-21 ENCOUNTER — Ambulatory Visit: Payer: Medicare Other | Admitting: Nutrition

## 2016-10-21 ENCOUNTER — Ambulatory Visit (HOSPITAL_BASED_OUTPATIENT_CLINIC_OR_DEPARTMENT_OTHER): Payer: Medicare Other

## 2016-10-21 VITALS — BP 103/70 | HR 78 | Temp 97.8°F | Resp 18

## 2016-10-21 DIAGNOSIS — Z5111 Encounter for antineoplastic chemotherapy: Secondary | ICD-10-CM | POA: Diagnosis not present

## 2016-10-21 DIAGNOSIS — C8338 Diffuse large B-cell lymphoma, lymph nodes of multiple sites: Secondary | ICD-10-CM | POA: Diagnosis present

## 2016-10-21 DIAGNOSIS — Z95828 Presence of other vascular implants and grafts: Secondary | ICD-10-CM

## 2016-10-21 DIAGNOSIS — Z5112 Encounter for antineoplastic immunotherapy: Secondary | ICD-10-CM | POA: Diagnosis not present

## 2016-10-21 LAB — COMPREHENSIVE METABOLIC PANEL
ALT: 9 U/L (ref 0–55)
AST: 14 U/L (ref 5–34)
Albumin: 3.2 g/dL — ABNORMAL LOW (ref 3.5–5.0)
Alkaline Phosphatase: 76 U/L (ref 40–150)
Anion Gap: 8 mEq/L (ref 3–11)
BUN: 9.3 mg/dL (ref 7.0–26.0)
CO2: 27 mEq/L (ref 22–29)
Calcium: 10.2 mg/dL (ref 8.4–10.4)
Chloride: 105 mEq/L (ref 98–109)
Creatinine: 0.7 mg/dL (ref 0.6–1.1)
EGFR: >90 mL/min/{1.73_m2} (ref >90)
GLUCOSE: 79 mg/dL (ref 70–140)
Potassium: 4.3 mEq/L (ref 3.5–5.1)
SODIUM: 139 meq/L (ref 136–145)
Total Bilirubin: 0.58 mg/dL (ref 0.20–1.20)
Total Protein: 6.2 g/dL — ABNORMAL LOW (ref 6.4–8.3)

## 2016-10-21 LAB — CBC WITH DIFFERENTIAL/PLATELET
BASO%: 0.4 % (ref 0.0–2.0)
BASOS ABS: 0 10*3/uL (ref 0.0–0.1)
EOS%: 2.6 % (ref 0.0–7.0)
Eosinophils Absolute: 0.2 10*3/uL (ref 0.0–0.5)
HCT: 30.6 % — ABNORMAL LOW (ref 34.8–46.6)
HGB: 9.5 g/dL — ABNORMAL LOW (ref 11.6–15.9)
LYMPH%: 7.7 % — ABNORMAL LOW (ref 14.0–49.7)
MCH: 27.9 pg (ref 25.1–34.0)
MCHC: 31 g/dL — AB (ref 31.5–36.0)
MCV: 90 fL (ref 79.5–101.0)
MONO#: 0.8 10*3/uL (ref 0.1–0.9)
MONO%: 10.6 % (ref 0.0–14.0)
NEUT#: 6 10*3/uL (ref 1.5–6.5)
NEUT%: 78.7 % — AB (ref 38.4–76.8)
Platelets: 256 10*3/uL (ref 145–400)
RBC: 3.4 10*6/uL — ABNORMAL LOW (ref 3.70–5.45)
RDW: 18.8 % — ABNORMAL HIGH (ref 11.2–14.5)
WBC: 7.6 10*3/uL (ref 3.9–10.3)
lymph#: 0.6 10*3/uL — ABNORMAL LOW (ref 0.9–3.3)

## 2016-10-21 MED ORDER — SODIUM CHLORIDE 0.9 % IV SOLN
Freq: Once | INTRAVENOUS | Status: AC
  Administered 2016-10-21: 10:00:00 via INTRAVENOUS

## 2016-10-21 MED ORDER — ACETAMINOPHEN 325 MG PO TABS
650.0000 mg | ORAL_TABLET | Freq: Once | ORAL | Status: AC
Start: 2016-10-21 — End: 2016-10-21
  Administered 2016-10-21: 650 mg via ORAL

## 2016-10-21 MED ORDER — VINCRISTINE SULFATE CHEMO INJECTION 1 MG/ML
1.0000 mg | Freq: Once | INTRAVENOUS | Status: AC
  Administered 2016-10-21: 1 mg via INTRAVENOUS
  Filled 2016-10-21: qty 1

## 2016-10-21 MED ORDER — DIPHENHYDRAMINE HCL 25 MG PO CAPS
ORAL_CAPSULE | ORAL | Status: AC
  Filled 2016-10-21: qty 2

## 2016-10-21 MED ORDER — HEPARIN SOD (PORK) LOCK FLUSH 100 UNIT/ML IV SOLN
500.0000 [IU] | Freq: Once | INTRAVENOUS | Status: AC | PRN
  Administered 2016-10-21: 500 [IU]
  Filled 2016-10-21: qty 5

## 2016-10-21 MED ORDER — LIDOCAINE-PRILOCAINE 2.5-2.5 % EX CREA: TOPICAL_CREAM | CUTANEOUS | Status: DC | PRN

## 2016-10-21 MED ORDER — SODIUM CHLORIDE 0.9 % IV SOLN
375.0000 mg/m2 | Freq: Once | INTRAVENOUS | Status: AC
  Administered 2016-10-21: 500 mg via INTRAVENOUS
  Filled 2016-10-21: qty 50

## 2016-10-21 MED ORDER — ACETAMINOPHEN 325 MG PO TABS
ORAL_TABLET | ORAL | Status: AC
Start: 2016-10-21 — End: 2016-10-21
  Filled 2016-10-21: qty 2

## 2016-10-21 MED ORDER — DEXAMETHASONE SODIUM PHOSPHATE 10 MG/ML IJ SOLN
10.0000 mg | Freq: Once | INTRAMUSCULAR | Status: AC
  Administered 2016-10-21: 10 mg via INTRAVENOUS

## 2016-10-21 MED ORDER — SODIUM CHLORIDE 0.9 % IV SOLN
500.0000 mg/m2 | Freq: Once | INTRAVENOUS | Status: AC
  Administered 2016-10-21: 720 mg via INTRAVENOUS
  Filled 2016-10-21: qty 36

## 2016-10-21 MED ORDER — SODIUM CHLORIDE 0.9% FLUSH
10.0000 mL | INTRAVENOUS | Status: DC | PRN
  Administered 2016-10-21: 10 mL
  Filled 2016-10-21: qty 10

## 2016-10-21 MED ORDER — DOXORUBICIN HCL CHEMO IV INJECTION 2 MG/ML
25.0000 mg/m2 | Freq: Once | INTRAVENOUS | Status: AC
  Administered 2016-10-21: 36 mg via INTRAVENOUS
  Filled 2016-10-21: qty 18

## 2016-10-21 MED ORDER — DEXAMETHASONE SODIUM PHOSPHATE 10 MG/ML IJ SOLN
INTRAMUSCULAR | Status: AC
  Filled 2016-10-21: qty 1

## 2016-10-21 MED ORDER — PALONOSETRON HCL INJECTION 0.25 MG/5ML
INTRAVENOUS | Status: AC
  Filled 2016-10-21: qty 5

## 2016-10-21 MED ORDER — DIPHENHYDRAMINE HCL 25 MG PO CAPS
50.0000 mg | ORAL_CAPSULE | Freq: Once | ORAL | Status: AC
  Administered 2016-10-21: 50 mg via ORAL

## 2016-10-21 MED ORDER — PALONOSETRON HCL INJECTION 0.25 MG/5ML
0.2500 mg | Freq: Once | INTRAVENOUS | Status: AC
  Administered 2016-10-21: 0.25 mg via INTRAVENOUS

## 2016-10-21 NOTE — Patient Instructions (Signed)
[facility] Discharge Instructions for Patients Receiving Chemotherapy  Today you received the following chemotherapy agents Adriamycin,Vincristin, Cytoxan, Rituxan  To help prevent nausea and vomiting after your treatment, we encourage you to take your nausea medication    If you develop nausea and vomiting that is not controlled by your nausea medication, call the clinic.   BELOW ARE SYMPTOMS THAT SHOULD BE REPORTED IMMEDIATELY:  *FEVER GREATER THAN 100.5 F  *CHILLS WITH OR WITHOUT FEVER  NAUSEA AND VOMITING THAT IS NOT CONTROLLED WITH YOUR NAUSEA MEDICATION  *UNUSUAL SHORTNESS OF BREATH  *UNUSUAL BRUISING OR BLEEDING  TENDERNESS IN MOUTH AND THROAT WITH OR WITHOUT PRESENCE OF ULCERS  *URINARY PROBLEMS  *BOWEL PROBLEMS  UNUSUAL RASH Items with * indicate a potential emergency and should be followed up as soon as possible.  Feel free to call the clinic you have any questions or concerns. The clinic phone number is [phone].  Please show the [place] at check-in to the Emergency Department and triage nurse.

## 2016-10-21 NOTE — Progress Notes (Signed)
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  She is a patient of Dr. Irene Limbo.  Past medical history includes hypertension, dyslipidemia.  Medications include Colace, Marinol, magnesium oxide, Zofran, Protonix, MiraLAX, and Compazine.  Labs include albumin 3.2.  Height: 4 feet 11 inches. Weight: 101.7 pounds on June 25. Usual body weight: 115 pounds February 2018 BMI: 20.54.  Patient is status post ileus and was diagnosed with severe malnutrition in the context of acute illness with moderate fat losses of severe loss of muscle mass during recent hospitalization. Patient noted to have fatigue, anorexia, and weight loss. Patient does not like ensure and has tried to make it taste better by adding fruit and ice cream. Does not think Marinol is helping her with increased appetite. She complains of early satiety.  Nutrition diagnosis:  Severe malnutrition in the context of acute illness secondary to 12% weight loss over 5 months, moderate fat losses, severe muscle loss.  Intervention: Educated patient on the importance of small frequent meals and snacks utilizing high-calorie, high-protein foods. Encouraged patient to try to find high-calorie supplement that she enjoys. Recommended patient switch to whole milk instead of 2% milk. Encouraged patient to discuss appetite stimulant with physician. Questions were answered.  Teach back method used.  Contact information provided and fact sheets were given.  Monitoring, evaluation, goals:  Patient will tolerate increased calories and protein to minimize weight loss.  Next visit: Monday, July 30, during infusion.  **Disclaimer: This note was dictated with voice recognition software. Similar sounding words can inadvertently be transcribed and this note may contain transcription errors which may not have been corrected upon publication of note.**

## 2016-10-22 ENCOUNTER — Other Ambulatory Visit: Payer: Self-pay

## 2016-10-22 ENCOUNTER — Telehealth: Payer: Self-pay

## 2016-10-22 MED ORDER — MAGNESIUM OXIDE 400 (241.3 MG) MG PO TABS: 400.0000 mg | ORAL_TABLET | Freq: Two times a day (BID) | ORAL | 0 refills | Status: DC

## 2016-10-22 NOTE — Telephone Encounter (Signed)
Called pt to tell her prescription for magnesium-oxide sent to [facility] on [address]. Pt in agreement with location and medication.

## 2016-10-23 ENCOUNTER — Ambulatory Visit (HOSPITAL_BASED_OUTPATIENT_CLINIC_OR_DEPARTMENT_OTHER): Payer: Medicare Other

## 2016-10-23 VITALS — BP 127/69 | HR 62 | Temp 97.8°F | Resp 20

## 2016-10-23 DIAGNOSIS — C8338 Diffuse large B-cell lymphoma, lymph nodes of multiple sites: Secondary | ICD-10-CM | POA: Diagnosis present

## 2016-10-23 DIAGNOSIS — Z5189 Encounter for other specified aftercare: Secondary | ICD-10-CM

## 2016-10-23 MED ORDER — PEGFILGRASTIM INJECTION 6 MG/0.6ML ~~LOC~~
6.0000 mg | PREFILLED_SYRINGE | Freq: Once | SUBCUTANEOUS | Status: AC
  Administered 2016-10-23: 6 mg via SUBCUTANEOUS
  Filled 2016-10-23: qty 0.6

## 2016-10-23 NOTE — Patient Instructions (Signed)
Pegfilgrastim injection What is this medicine? PEGFILGRASTIM (PEG fil gra stim) is a long-acting granulocyte colony-stimulating factor that stimulates the growth of neutrophils, a type of white blood cell important in the body's fight against infection. It is used to reduce the incidence of fever and infection in patients with certain types of cancer who are receiving chemotherapy that affects the bone marrow, and to increase survival after being exposed to high doses of radiation. This medicine may be used for other purposes; ask your health care provider or pharmacist if you have questions. COMMON BRAND NAME(S): Neulasta What should I tell my health care provider before I take this medicine? They need to know if you have any of these conditions: -kidney disease -latex allergy -ongoing radiation therapy -sickle cell disease -skin reactions to acrylic adhesives (On-Body Injector only) -an unusual or allergic reaction to pegfilgrastim, filgrastim, other medicines, foods, dyes, or preservatives -pregnant or trying to get pregnant -breast-feeding How should I use this medicine? This medicine is for injection under the skin. If you get this medicine at home, you will be taught how to prepare and give the pre-filled syringe or how to use the On-body Injector. Refer to the patient Instructions for Use for detailed instructions. Use exactly as directed. Tell your healthcare provider immediately if you suspect that the On-body Injector may not have performed as intended or if you suspect the use of the On-body Injector resulted in a missed or partial dose. It is important that you put your used needles and syringes in a special sharps container. Do not put them in a trash can. If you do not have a sharps container, call your pharmacist or healthcare provider to get one. Talk to your pediatrician regarding the use of this medicine in children. While this drug may be prescribed for selected conditions,  precautions do apply. Overdosage: If you think you have taken too much of this medicine contact a poison control center or emergency room at once. NOTE: This medicine is only for you. Do not share this medicine with others. What if I miss a dose? It is important not to miss your dose. Call your doctor or health care professional if you miss your dose. If you miss a dose due to an On-body Injector failure or leakage, a new dose should be administered as soon as possible using a single prefilled syringe for manual use. What may interact with this medicine? Interactions have not been studied. Give your health care provider a list of all the medicines, herbs, non-prescription drugs, or dietary supplements you use. Also tell them if you smoke, drink alcohol, or use illegal drugs. Some items may interact with your medicine. This list may not describe all possible interactions. Give your health care provider a list of all the medicines, herbs, non-prescription drugs, or dietary supplements you use. Also tell them if you smoke, drink alcohol, or use illegal drugs. Some items may interact with your medicine. What should I watch for while using this medicine? You may need blood work done while you are taking this medicine. If you are going to need a MRI, CT scan, or other procedure, tell your doctor that you are using this medicine (On-Body Injector only). What side effects may I notice from receiving this medicine? Side effects that you should report to your doctor or health care professional as soon as possible: -allergic reactions like skin rash, itching or hives, swelling of the face, lips, or tongue -dizziness -fever -pain, redness, or irritation at site   where injected -pinpoint red spots on the skin -red or dark-brown urine -shortness of breath or breathing problems -stomach or side pain, or pain at the shoulder -swelling -tiredness -trouble passing urine or change in the amount of urine Side  effects that usually do not require medical attention (report to your doctor or health care professional if they continue or are bothersome): -bone pain -muscle pain This list may not describe all possible side effects. Call your doctor for medical advice about side effects. You may report side effects to FDA at 1-800-FDA-1088. Where should I keep my medicine? Keep out of the reach of children. Store pre-filled syringes in a refrigerator between 2 and 8 degrees C (36 and 46 degrees F). Do not freeze. Keep in carton to protect from light. Throw away this medicine if it is left out of the refrigerator for more than 48 hours. Throw away any unused medicine after the expiration date. NOTE: This sheet is a summary. It may not cover all possible information. If you have questions about this medicine, talk to your doctor, pharmacist, or health care provider.  2018 [employer] (2016-03-28 12:58:03)  

## 2016-10-29 ENCOUNTER — Telehealth: Payer: Self-pay

## 2016-11-04 ENCOUNTER — Ambulatory Visit (HOSPITAL_COMMUNITY)
Admission: RE | Admit: 2016-11-04 | Discharge: 2016-11-04 | Disposition: A | Discharge status: Home / Self Care | Payer: Medicare Other | Source: Ambulatory Visit | Attending: Hematology | Admitting: Hematology

## 2016-11-04 DIAGNOSIS — C8338 Diffuse large B-cell lymphoma, lymph nodes of multiple sites: Secondary | ICD-10-CM | POA: Diagnosis present

## 2016-11-04 DIAGNOSIS — I700 Atherosclerosis of aorta: Secondary | ICD-10-CM | POA: Diagnosis not present

## 2016-11-04 DIAGNOSIS — Z9889 Other specified postprocedural states: Secondary | ICD-10-CM | POA: Diagnosis not present

## 2016-11-04 DIAGNOSIS — N2889 Other specified disorders of kidney and ureter: Secondary | ICD-10-CM | POA: Diagnosis not present

## 2016-11-04 LAB — GLUCOSE, CAPILLARY: Glucose-Capillary: 81 mg/dL (ref 65–99)

## 2016-11-04 MED ORDER — FLUDEOXYGLUCOSE F - 18 (FDG) INJECTION
5.1000 | Freq: Once | INTRAVENOUS | Status: AC | PRN
  Administered 2016-11-04: 5.1 via INTRAVENOUS

## 2016-11-06 ENCOUNTER — Telehealth: Payer: Self-pay

## 2016-11-06 MED ORDER — LIDOCAINE-PRILOCAINE 2.5-2.5 % EX CREA: TOPICAL_CREAM | CUTANEOUS | 1 refills | Status: AC

## 2016-11-06 NOTE — Telephone Encounter (Signed)
Pt requested EMLA cream for port. Filled per protocol

## 2016-11-08 NOTE — Telephone Encounter (Signed)
Prescritions signed by Dr. Irene Limbo. Confirmed fax receipt.

## 2016-11-11 ENCOUNTER — Ambulatory Visit (HOSPITAL_BASED_OUTPATIENT_CLINIC_OR_DEPARTMENT_OTHER): Payer: Medicare Other

## 2016-11-11 ENCOUNTER — Other Ambulatory Visit (HOSPITAL_BASED_OUTPATIENT_CLINIC_OR_DEPARTMENT_OTHER): Payer: Medicare Other

## 2016-11-11 ENCOUNTER — Ambulatory Visit: Payer: Medicare Other | Admitting: Nutrition

## 2016-11-11 ENCOUNTER — Encounter: Payer: Self-pay | Admitting: Hematology

## 2016-11-11 ENCOUNTER — Ambulatory Visit (HOSPITAL_BASED_OUTPATIENT_CLINIC_OR_DEPARTMENT_OTHER): Payer: Medicare Other | Admitting: Hematology

## 2016-11-11 ENCOUNTER — Telehealth: Payer: Self-pay

## 2016-11-11 VITALS — BP 128/70 | HR 64 | Temp 98.1°F | Resp 18 | Ht 59.0 in | Wt 95.3 lb

## 2016-11-11 DIAGNOSIS — Z5112 Encounter for antineoplastic immunotherapy: Secondary | ICD-10-CM

## 2016-11-11 DIAGNOSIS — C8338 Diffuse large B-cell lymphoma, lymph nodes of multiple sites: Secondary | ICD-10-CM

## 2016-11-11 DIAGNOSIS — R627 Adult failure to thrive: Secondary | ICD-10-CM

## 2016-11-11 DIAGNOSIS — G893 Neoplasm related pain (acute) (chronic): Secondary | ICD-10-CM

## 2016-11-11 DIAGNOSIS — Z5111 Encounter for antineoplastic chemotherapy: Secondary | ICD-10-CM

## 2016-11-11 DIAGNOSIS — R630 Anorexia: Secondary | ICD-10-CM

## 2016-11-11 DIAGNOSIS — I340 Nonrheumatic mitral (valve) insufficiency: Secondary | ICD-10-CM | POA: Diagnosis not present

## 2016-11-11 DIAGNOSIS — E860 Dehydration: Secondary | ICD-10-CM

## 2016-11-11 DIAGNOSIS — N289 Disorder of kidney and ureter, unspecified: Secondary | ICD-10-CM

## 2016-11-11 LAB — TECHNOLOGIST REVIEW

## 2016-11-11 LAB — CBC & DIFF AND RETIC
BASO%: 0.4 % (ref 0.0–2.0)
BASOS ABS: 0 10*3/uL (ref 0.0–0.1)
EOS%: 1.3 % (ref 0.0–7.0)
Eosinophils Absolute: 0.1 10*3/uL (ref 0.0–0.5)
HEMATOCRIT: 32.8 % — AB (ref 34.8–46.6)
HGB: 10.2 g/dL — ABNORMAL LOW (ref 11.6–15.9)
Immature Retic Fract: 8.9 % (ref 1.60–10.00)
LYMPH#: 0.5 10*3/uL — AB (ref 0.9–3.3)
LYMPH%: 6.5 % — AB (ref 14.0–49.7)
MCH: 28 pg (ref 25.1–34.0)
MCHC: 31.1 g/dL — AB (ref 31.5–36.0)
MCV: 90.1 fL (ref 79.5–101.0)
MONO#: 0.5 10*3/uL (ref 0.1–0.9)
MONO%: 5.5 % (ref 0.0–14.0)
NEUT#: 7.2 10*3/uL — ABNORMAL HIGH (ref 1.5–6.5)
NEUT%: 86.3 % — AB (ref 38.4–76.8)
PLATELETS: 208 10*3/uL (ref 145–400)
RBC: 3.64 10*6/uL — ABNORMAL LOW (ref 3.70–5.45)
RDW: 20.1 % — ABNORMAL HIGH (ref 11.2–14.5)
RETIC CT ABS: 72.44 10*3/uL (ref 33.70–90.70)
Retic %: 1.99 % (ref 0.70–2.10)
WBC: 8.4 10*3/uL (ref 3.9–10.3)

## 2016-11-11 LAB — COMPREHENSIVE METABOLIC PANEL
ALT: 7 U/L (ref 0–55)
ANION GAP: 7 meq/L (ref 3–11)
AST: 15 U/L (ref 5–34)
Albumin: 3.6 g/dL (ref 3.5–5.0)
Alkaline Phosphatase: 86 U/L (ref 40–150)
BILIRUBIN TOTAL: 0.64 mg/dL (ref 0.20–1.20)
BUN: 7 mg/dL (ref 7.0–26.0)
CALCIUM: 10.2 mg/dL (ref 8.4–10.4)
CHLORIDE: 103 meq/L (ref 98–109)
CO2: 28 mEq/L (ref 22–29)
CREATININE: 0.7 mg/dL (ref 0.6–1.1)
Glucose: 99 mg/dl (ref 70–140)
Potassium: 4.3 mEq/L (ref 3.5–5.1)
Sodium: 138 mEq/L (ref 136–145)
Total Protein: 6.4 g/dL (ref 6.4–8.3)

## 2016-11-11 LAB — MAGNESIUM: MAGNESIUM: 1.4 mg/dL — AB (ref 1.5–2.5)

## 2016-11-11 LAB — LACTATE DEHYDROGENASE: LDH: 300 U/L — AB (ref 125–245)

## 2016-11-11 MED ORDER — OXYCODONE HCL 5 MG PO TABS: 5.0000 mg | ORAL_TABLET | ORAL | 0 refills | Status: DC | PRN

## 2016-11-11 MED ORDER — SODIUM CHLORIDE 0.9% FLUSH
10.0000 mL | INTRAVENOUS | Status: DC | PRN
  Administered 2016-11-11: 10 mL
  Filled 2016-11-11: qty 10

## 2016-11-11 MED ORDER — SODIUM CHLORIDE 0.9 % IV SOLN
375.0000 mg/m2 | Freq: Once | INTRAVENOUS | Status: AC
  Administered 2016-11-11: 500 mg via INTRAVENOUS
  Filled 2016-11-11: qty 50

## 2016-11-11 MED ORDER — SODIUM CHLORIDE 0.9 % IV SOLN
Freq: Once | INTRAVENOUS | Status: AC
  Administered 2016-11-11: 10:00:00 via INTRAVENOUS
  Filled 2016-11-11: qty 100

## 2016-11-11 MED ORDER — DEXAMETHASONE SODIUM PHOSPHATE 10 MG/ML IJ SOLN
INTRAMUSCULAR | Status: AC
  Filled 2016-11-11: qty 1

## 2016-11-11 MED ORDER — DEXAMETHASONE SODIUM PHOSPHATE 10 MG/ML IJ SOLN
10.0000 mg | Freq: Once | INTRAMUSCULAR | Status: AC
  Administered 2016-11-11: 10 mg via INTRAVENOUS

## 2016-11-11 MED ORDER — DOXORUBICIN HCL CHEMO IV INJECTION 2 MG/ML
50.0000 mg/m2 | Freq: Once | INTRAVENOUS | Status: AC
  Administered 2016-11-11: 72 mg via INTRAVENOUS
  Filled 2016-11-11: qty 36

## 2016-11-11 MED ORDER — VINCRISTINE SULFATE CHEMO INJECTION 1 MG/ML
1.0000 mg | Freq: Once | INTRAVENOUS | Status: AC
  Administered 2016-11-11: 1 mg via INTRAVENOUS
  Filled 2016-11-11: qty 1

## 2016-11-11 MED ORDER — DIPHENHYDRAMINE HCL 25 MG PO CAPS
ORAL_CAPSULE | ORAL | Status: AC
  Filled 2016-11-11: qty 2

## 2016-11-11 MED ORDER — PALONOSETRON HCL INJECTION 0.25 MG/5ML
INTRAVENOUS | Status: AC
  Filled 2016-11-11: qty 5

## 2016-11-11 MED ORDER — DRONABINOL 2.5 MG PO CAPS: 2.5000 mg | ORAL_CAPSULE | Freq: Two times a day (BID) | ORAL | 0 refills | Status: AC

## 2016-11-11 MED ORDER — HEPARIN SOD (PORK) LOCK FLUSH 100 UNIT/ML IV SOLN
500.0000 [IU] | Freq: Once | INTRAVENOUS | Status: AC | PRN
Start: 2016-11-11 — End: 2016-11-11
  Administered 2016-11-11: 500 [IU]
  Filled 2016-11-11: qty 5

## 2016-11-11 MED ORDER — SODIUM CHLORIDE 0.9 % IV SOLN
500.0000 mg/m2 | Freq: Once | INTRAVENOUS | Status: AC
  Administered 2016-11-11: 720 mg via INTRAVENOUS
  Filled 2016-11-11: qty 36

## 2016-11-11 MED ORDER — ACETAMINOPHEN 325 MG PO TABS
650.0000 mg | ORAL_TABLET | Freq: Once | ORAL | Status: AC
  Administered 2016-11-11: 650 mg via ORAL

## 2016-11-11 MED ORDER — DIPHENHYDRAMINE HCL 25 MG PO CAPS
50.0000 mg | ORAL_CAPSULE | Freq: Once | ORAL | Status: AC
  Administered 2016-11-11: 50 mg via ORAL

## 2016-11-11 MED ORDER — PALONOSETRON HCL INJECTION 0.25 MG/5ML
0.2500 mg | Freq: Once | INTRAVENOUS | Status: AC
  Administered 2016-11-11: 0.25 mg via INTRAVENOUS

## 2016-11-11 MED ORDER — SODIUM CHLORIDE 0.9 % IV SOLN
Freq: Once | INTRAVENOUS | Status: AC
  Administered 2016-11-11: 10:00:00 via INTRAVENOUS

## 2016-11-11 MED ORDER — ACETAMINOPHEN 325 MG PO TABS
ORAL_TABLET | ORAL | Status: AC
  Filled 2016-11-11: qty 2

## 2016-11-11 NOTE — Progress Notes (Signed)
[name] Kitchen    HEMATOLOGY/ONCOLOGY CLINIC NOTE  Date of Service: .11/11/2016  Patient Care Team: Smothers, Andree Elk, NP as PCP - General (Nurse Practitioner)  CHIEF COMPLAINTS/PURPOSE OF CONSULTATION:  Post hospitalization f/u for DLBCL Renal cell carcinoma ?  HISTORY OF PRESENTING ILLNESS:  Christina Peterson is a wonderful 76 y.o. female who has been referred to [place] by Dr Cathlean Sauer, Jimmy Picket,*   for evaluation and management of newly diagnosed Large B cell lymphoma.  Patient has a history of hypertension, dyslipidemia, arthritis but notes that she lives independently in a senior housing facility and does all her ADLs herself.  She notes that she has been having increasing abdominal pain and mid back pain for about 3-4 weeks and has had some increased fatigue over the last couple of months. She notes that she has lost about 7-8 pounds over the last 2-3 weeks. She was recently admitted to the hospital from 08/10/2016-08/15/2016 with abdominal pain and poor oral intake. She was noted to be dehydrated with hyponatremia and CT scan of the abdomen on 08/12/2016 showed lymphadenopathy within the chest and abdomen concerning for lymphoma. She also was noted to have a right renal mass in the lower pole of the right kidney measuring 3.6 cm concerning for renal cell carcinoma.  Patient had a CT-guided biopsy of her retroperitoneal lymph node on 08/14/2016 which shows large B-cell lymphoma. She was to be seen in the oncology clinic but got readmitted on 08/21/2016 with worsening abdominal pain nausea and decreased appetite.  She was noted to be significantly hyponatremic again and is currently on IV fluids. She notes her abdominal pain is better controlled with pain medications she is receiving. No fevers or chills. I was consulted to help with further evaluation and management of her newly diagnosed large B-cell lymphoma.  Patient notes abdominal and back pain and about an 8-10 pound weight loss in the last  few weeks and inability to keep much food down due to pain and anorexia. She has been living by herself and demonstrated failure to thrive.  We discussed the diagnosis, natural history, prognosis and treatment options in detail including additional workup including a PET scan, need for a Port-A-Cath and different chemotherapy options possible adverse effects and limitations 2 doses of chemotherapy that can be used as a result of her age. Patient understands and is able to repeat back the understanding of these elements of care and would like to proceed with the most appropriate treatment.  She received her first cycle of R-mini CHOP on 08/26/2016 and had outpatient Neulasta shot since then.  INTERVAL HISTORY  Patient is here for her followup prior to her third cycle of R-mini CHOP chemotherapy. She notes no overt toxicities from her chemotherapy thus far. Blood counts are stable. No issues with significant neutropenia. She had a PET scan on 11/04/2016 which shows treatment response but still with fairly active disease. We discussed pros versus cons of increasing her chemotherapy dose to try to achieve better disease control. She is agreeable to this. Her functional status is somewhat better and she is eating better with the Marinol. We shall increase her anthracycline dose to standard CHOP dosing and continue with dose reduced cyclophosphamide.   No fevers no chills no night sweats. No overt new abdominal pain. Magnesium still borderline at 1.4- we shall replaced today No diarrhea. Patient was encouraged to focus on good by mouth intake.   MEDICAL HISTORY:  Past Medical History:  Diagnosis Date  . Arthritis   .  Diffuse large B-cell lymphoma of lymph nodes of multiple regions ([place]) 08/22/2016  . Hypercholesteremia   . Hypertension    FOLLOWED BY DR Katherine Roan  . Mitral valve prolapse -  severe MR 09/20/2016   Posterior Leaflet  . Severe mitral regurgitation: Per 2 D echo 08/23/2016  08/23/2016   .posterior leaflet prolapse of mitral valve with anteriorly directed mitral regurgitation. Severe. Severely dilated left atrium.  . Severe protein-calorie malnutrition ([place])     SURGICAL HISTORY: Past Surgical History:  Procedure Laterality Date  . ABDOMINAL HYSTERECTOMY    . IR FLUORO GUIDE PORT INSERTION RIGHT  08/23/2016  . IR US GUIDE VASC ACCESS RIGHT  08/23/2016  . JOINT REPLACEMENT    . 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 TOTAL HIP REVISION  05/02/2011   Procedure: TOTAL HIP REVISION;  Surgeon: Sharmon Revere, MD;  Location: [facility];  Service: Orthopedics;  Laterality: Left;  left total hip revision with bone grafting  . TRANSTHORACIC ECHOCARDIOGRAM  08/23/2013    EF 60-65%. GR 2 DD.posterior leaflet prolapse of mitral valve with anteriorly directed mitral regurgitation. Severe. Severely dilated left atrium.    SOCIAL HISTORY: Social History   Social History  . Marital status: Legally Separated    Spouse name: N/A  . Number of children: 4  . Years of education: 12   Occupational History  . Not on file.   Social History Main Topics  . Smoking status: Never Smoker  . Smokeless tobacco: Never Used  . Alcohol use No  . Drug use: No  . Sexual activity: Not on file   Other Topics Concern  . Not on file   Social History Narrative   She lives alone in an apartment.  She relies on family members for transportation & helping her figure out her medications.   She has listed married, but reportedly she is legally separated.   They have 4 children and 5 grandchildren and 5 great-grandchildren.   Has Social Security & DSS assistance with food stamps & food banks.      FAMILY HISTORY: Family History  Problem Relation Age of Onset  . Heart disease Mother   . Hypertension Mother   . Heart attack Mother 24  . Hypertension Father   . Brain cancer Father   . Brain cancer Sister   . Hypertension Sister   . Stroke Sister   . Alcohol abuse Brother   .  Depression Daughter   . Hypertension Daughter   . Thyroid disease Daughter   . Migraines Daughter     ALLERGIES:  has No Known Allergies.  MEDICATIONS:  Current Outpatient Prescriptions  Medication Sig Dispense Refill  . docusate sodium (COLACE) 100 MG capsule Take 1 capsule (100 mg total) by mouth every 12 (twelve) hours. 60 capsule 1  . dronabinol (MARINOL) 2.5 MG capsule Take 1 capsule (2.5 mg total) by mouth 2 (two) times daily before a meal. 60 capsule 0  . feeding supplement, ENSURE ENLIVE, (ENSURE ENLIVE) LIQD Take 237 mLs by mouth 2 (two) times daily between meals. (Patient taking differently: Take 237 mLs by mouth 2 (two) times daily as needed (nutritional supplement). ) 4500 mL 3  . hydrOXYzine (ATARAX/VISTARIL) 25 MG tablet Take 25 mg by mouth 3 (three) times daily as needed.    . lidocaine-prilocaine (EMLA) cream Apply to port 1-2 hours before needle access. Cover with plastic wrap. 30 g 1  . magnesium oxide (MAG-OX) 400 (241.3 Mg) MG tablet Take 1  tablet (400 mg total) by mouth 2 (two) times daily. 60 tablet 0  . ondansetron (ZOFRAN) 8 MG tablet Take 1 tablet (8 mg total) by mouth every 8 (eight) hours as needed for nausea or vomiting. 30 tablet 0  . oxyCODONE (OXY IR/ROXICODONE) 5 MG immediate release tablet Take 1-2 tablets (5-10 mg total) by mouth every 4 (four) hours as needed for severe pain or breakthrough pain. 60 tablet 0  . pantoprazole (PROTONIX) 20 MG tablet Take 1 tablet (20 mg total) by mouth daily. 30 tablet 0  . polyethylene glycol (MIRALAX / GLYCOLAX) packet Take 17 g by mouth daily. 28 each 1  . potassium chloride SA (K-DUR,KLOR-CON) 20 MEQ tablet Take 1 tablet (20 mEq total) by mouth 2 (two) times daily. 10 tablet 0  . predniSONE (DELTASONE) 20 MG tablet Take 3 tablets (60 mg total) by mouth See admin instructions. 60 mg po daily Day 1 to Day 5 of each chemotherapy cycle 30 tablet 3  . prochlorperazine (COMPAZINE) 10 MG tablet Take 1 tablet (10 mg total) by mouth  every 6 (six) hours as needed for nausea or vomiting. 30 tablet 0  . senna-docusate (SENNA S) 8.6-50 MG tablet Take 2 tablets by mouth 2 (two) times daily. Reduce to 2tab po HS if diarrhea occurs 60 tablet 2  . sucralfate (CARAFATE) 1 g tablet Take 1 tablet (1 g total) by mouth 4 (four) times daily. 30 tablet 0   No current facility-administered medications for this visit.    Facility-Administered Medications Ordered in Other Visits  Medication Dose Route Frequency Provider Last Rate Last Dose  . acetaminophen (TYLENOL) tablet 650 mg  650 mg Oral Once Brunetta Genera, MD      . cyclophosphamide (CYTOXAN) 720 mg in sodium chloride 0.9 % 250 mL chemo infusion  500 mg/m2 (Treatment Plan Recorded) Intravenous Once Brunetta Genera, MD      . diphenhydrAMINE (BENADRYL) capsule 50 mg  50 mg Oral Once Brunetta Genera, MD      . DOXOrubicin (ADRIAMYCIN) chemo injection 72 mg  50 mg/m2 (Treatment Plan Recorded) Intravenous Once Brunetta Genera, MD      . heparin lock flush 100 unit/mL  500 Units Intracatheter Once PRN Brunetta Genera, MD      . ondansetron (ZOFRAN-ODT) disintegrating tablet 8 mg  8 mg Oral Once Brunetta Genera, MD      . riTUXimab (RITUXAN) 500 mg in sodium chloride 0.9 % 200 mL chemo infusion  375 mg/m2 (Treatment Plan Recorded) Intravenous Once Brunetta Genera, MD      . sodium chloride 0.9 % 100 mL with magnesium sulfate 2 g infusion   Intravenous Once Brunetta Genera, MD      . sodium chloride flush (NS) 0.9 % injection 10 mL  10 mL Intracatheter PRN Brunetta Genera, MD      . vinCRIStine (ONCOVIN) 1 mg in sodium chloride 0.9 % 50 mL chemo infusion  1 mg Intravenous Once Brunetta Genera, MD        REVIEW OF SYSTEMS:    10 Point review of Systems was done is negative except as noted above.  PHYSICAL EXAMINATION: ECOG PERFORMANCE STATUS: 2 - Symptomatic, <50% confined to bed  . Vitals:   11/11/16 0900  BP: 128/70  Pulse: 64    Resp: 18  Temp: 98.1 F (36.7 C)   Filed Weights   11/11/16 0900  Weight: 95 lb 4.8 oz (43.2 kg)   .Body mass  index is 19.25 kg/m.  GENERAL:alert, in no acute distress and comfortable SKIN: no acute rashes, no significant lesions EYES: conjunctiva are pink and non-injected, sclera anicteric OROPHARYNX: MMM, no exudates, no oropharyngeal erythema or ulceration NECK: supple, no JVD LYMPH:  b/l palpable supraclavicular LN's, no palpable cervical LNadenopathy. LUNGS: clear to auscultation b/l with normal respiratory effort HEART: regular rate & rhythm, 3 x 6 systolic murmur over the sternum and aortic area  ABDOMEN:  normoactive bowel sounds , non tender, not distended. Extremity: no pedal edema PSYCH: alert & oriented x 3 with fluent speech NEURO: no focal motor/sensory deficits   LABORATORY DATA:  I have reviewed the data as listed. CBC Latest Ref Rng & Units 11/11/2016 10/21/2016 10/07/2016  WBC 3.9 - 10.3 10e3/uL 8.4 7.6 16.0(H)  Hemoglobin 11.6 - 15.9 g/dL 10.2(L) 9.5(L) 10.2(L)  Hematocrit 34.8 - 46.6 % 32.8(L) 30.6(L) 32.9(L)  Platelets 145 - 400 10e3/uL 208 256 195    . CMP Latest Ref Rng & Units 11/11/2016 10/21/2016 10/07/2016  Glucose 70 - 140 mg/dl 99 79 100  BUN 7.0 - 26.0 mg/dL 7.0 9.3 7.1  Creatinine 0.6 - 1.1 mg/dL 0.7 0.7 0.8  Sodium 136 - 145 mEq/L 138 139 142  Potassium 3.5 - 5.1 mEq/L 4.3 4.3 4.6  Chloride 101 - 111 mmol/L - - -  CO2 22 - 29 mEq/L 28 27 29   Calcium 8.4 - 10.4 mg/dL 10.2 10.2 9.7  Total Protein 6.4 - 8.3 g/dL 6.4 6.2(L) 5.6(L)  Total Bilirubin 0.20 - 1.20 mg/dL 0.64 0.58 0.55  Alkaline Phos 40 - 150 U/L 86 76 106  AST 5 - 34 U/L 15 14 14   ALT 0 - 55 U/L 7 9 9      ------------------------------------------------------------------- LV EF: 60% - 65%  ------------------------------------------------------------------- Indications: (Chemo I74.9).  ------------------------------------------------------------------- History:  PMH: No prior cardiac history. Risk factors: Dyslipidemia.  ------------------------------------------------------------------- Study Conclusions  - Left ventricle: The cavity size was normal. Wall thickness was normal. Systolic function was normal. The estimated ejection fraction was in the range of 60% to 65%. Features are consistent with a pseudonormal left ventricular filling pattern, with concomitant abnormal relaxation and increased filling pressure (grade 2 diastolic dysfunction). Doppler parameters are consistent with high ventricular filling pressure. - Aortic valve: There was trivial regurgitation. - Mitral valve: Prolapse of the posterior milral leaflet MR is anteriorly directed and wraps aound wall of LA MR is severe. - Left atrium: The atrium was severely dilated. - Right atrium: The atrium was mildly to moderately dilated. - Pulmonary arteries: PA peak pressure: 49 mm Hg (S).  RADIOGRAPHIC STUDIES: I have personally reviewed the radiological images as listed and agreed with the findings in the report. .Nm Pet Image Restag (ps) Skull Base To Thigh  Result Date: 11/04/2016 CLINICAL DATA:  Initial treatment strategy for B-cell lymphoma, status post 3 cycles of chemotherapy. EXAM: NUCLEAR MEDICINE PET SKULL BASE TO THIGH TECHNIQUE: 5.1 mCi F-18 FDG was injected intravenously. Full-ring PET imaging was performed from the skull base to thigh after the radiotracer. CT data was obtained and used for attenuation correction and anatomic localization. FASTING BLOOD GLUCOSE:  Value: 81 mg/dl COMPARISON:  Chest CT 08/12/2016. Most recent abdominopelvic CT of 09/06/2016. No prior PET. FINDINGS: NECK Hypermetabolic low left jugular node measures 8 mm and a S.U.V. max of 37.8 on image 37/series 4. This node measured 7 mm back on 08/12/2016. No cervical adenopathy. CHEST No pulmonary parenchymal or thoracic nodal hypermetabolism identified. Right Port-A-Cath terminates at the  high  right atrium. Moderate cardiomegaly. Anterior mediastinal adenopathy has resolved. Bibasilar pulmonary opacities have cleared since 09/06/2016. ABDOMEN/PELVIS Hypermetabolic adenopathy within the porta hepatis region is decreased in size. Measures on the order of 1.8 cm by 2.5 and a S.U.V. max of 17.5 on image 98/series 4. Compare 5.6 cm on 08/12/2016 and 4.2 cm on 09/06/2016. Left periaortic and adjacent small bowel mesenteric adenopathy corresponds to hypermetabolism. Index left periaortic node measures 2.0 x 2.3 cm and a S.U.V. max of 59.1 on image 118/series 4. Compare 2.3 x 2.1 cm back on 08/12/2016. The largest small bowel mesenteric mass measures 2.0 cm on image 120/series 4 versus 2.5 cm on 08/12/2016 (when remeasured). The suspicious right renal mass on prior CT is only mildly hypermetabolic, measuring a S.U.V. max of 2.9. Abdominal aortic atherosclerosis. SKELETON Diffuse mild marrow hypermetabolism. Left hip arthroplasty. Right hip osteoarthritis. No suspicious focal osseous abnormality. IMPRESSION: 1. Mild response to therapy since prior diagnostic CTs. Persistent hypermetabolic disease within the neck and abdomen. ([place] 5.) 2. Resolution of bibasilar airspace disease/pneumonia since 09/06/2016. 3.  Aortic Atherosclerosis (ICD10-I70.0). 4. Right sided renal mass is again suspicious for renal cell carcinoma but suboptimally evaluated. 5. Marrow hypermetabolism is favored to be related to stimulation by chemotherapy. Recommend attention on follow-up. Electronically Signed   By: Abigail Miyamoto M.D.   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 Patient has extensive retroperitoneal mesenteric and periportal lymphadenopathy along with adenopathy in the chest and supraclavicular areas and a focal lesion in the spleen.   Patient received first cycle of R-mini CHOP on 08/26/2016 and received  outpatient neulasta. Patient received 2nd  cycle of R-mini CHOP on 09/26/2016 and received outpatient neulasta. Received third cycle of R-mini CHOP on 09/26/2016 and received outpatient neulasta. Received third cycle of R-mini CHOP on 10/21/2016 and received outpatient neulasta. Plan -PET CT scan from 11/04/2016 was reviewed in detail with the patient and her daughter. -PET scan on 11/04/2016 which shows treatment response but still with fairly active disease. We discussed pros versus cons of increasing her chemotherapy dose to try to achieve better disease control. She is agreeable to this. Her functional status is somewhat better and she is eating better with the Marinol. We shall increase her anthracycline dose (doxorubicin 50 mg/m) to standard CHOP dosing and continue with dose reduced cyclophosphamide. -Continue Neulasta support   #2 Hypomagnesemia Mg 0.9--now improved to 1.4. Still somewhat low at 1.4 today Plan -magnesium replacement ordered  #3 Right lower pole of the kidney lesion ? Renal cell carcinoma versus involvement by lymphoma . Plan -Will hold off on biopsy at this time. - no significant change in size . 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  #3 significant fatigue and failure to thrive due to poor oral intake related lymphoma and type B constitutional symptoms . Plan -Continue to follow with barara Neff/dietitian to address her nutritional requirements. -Continue Marinol 2.5 mg by mouth twice a day since this appears to have helped her appetite .  #4 neoplasm related pain currently well-controlled  Plan  -Oxycodone 5-10 mg every 4 hours as needed for breakthrough pain  #5 severe mitral regurgitation normal ejection fraction -Has been given a referral for outpatient cardiology on recent discharge from the hospital.  -continue R_mini CHOP q3 weeks. plz schedule cycle 5 and 6 -Return to clinic with Dr. Irene Limbo in 3 weeks on cycle  1 day 1 of cycle 5 of treatment  with labs.  All of the patients questions were answered with apparent satisfaction. The patient knows to call the clinic with any problems, questions or concerns.  I spent 20 minutes counseling the patient face to face. The total time spent in the appointment was 30 minutes and more than 50% was on counseling and direct patient cares.    Sullivan Lone MD [place] AAHIVMS [facility] Hematology/Oncology Physician [facility]  (Office):       [phone] (Work cell):  [phone] (Fax):           [phone]

## 2016-11-11 NOTE — Progress Notes (Signed)
[name] Kitchen    HEMATOLOGY/ONCOLOGY CLINIC NOTE  Date of Service: .09/26/2016  Patient Care Team: Smothers, Andree Elk, NP as PCP - General (Nurse Practitioner)  CHIEF COMPLAINTS/PURPOSE OF CONSULTATION:  Post hospitalization f/u for DLBCL Renal cell carcinoma ?  HISTORY OF PRESENTING ILLNESS:  Christina Peterson is a wonderful 76 y.o. female who has been referred to [place] by Dr Cathlean Sauer, Jimmy Picket,*   for evaluation and management of newly diagnosed Large B cell lymphoma.  Patient has a history of hypertension, dyslipidemia, arthritis but notes that she lives independently in a senior housing facility and does all her ADLs herself.  She notes that she has been having increasing abdominal pain and mid back pain for about 3-4 weeks and has had some increased fatigue over the last couple of months. She notes that she has lost about 7-8 pounds over the last 2-3 weeks. She was recently admitted to the hospital from 08/10/2016-08/15/2016 with abdominal pain and poor oral intake. She was noted to be dehydrated with hyponatremia and CT scan of the abdomen on 08/12/2016 showed lymphadenopathy within the chest and abdomen concerning for lymphoma. She also was noted to have a right renal mass in the lower pole of the right kidney measuring 3.6 cm concerning for renal cell carcinoma.  Patient had a CT-guided biopsy of her retroperitoneal lymph node on 08/14/2016 which shows large B-cell lymphoma. She was to be seen in the oncology clinic but got readmitted on 08/21/2016 with worsening abdominal pain nausea and decreased appetite.  She was noted to be significantly hyponatremic again and is currently on IV fluids. She notes her abdominal pain is better controlled with pain medications she is receiving. No fevers or chills. I was consulted to help with further evaluation and management of her newly diagnosed large B-cell lymphoma.  Patient notes abdominal and back pain and about an 8-10 pound weight loss in the last  few weeks and inability to keep much food down due to pain and anorexia. She has been living by herself and demonstrated failure to thrive.  We discussed the diagnosis, natural history, prognosis and treatment options in detail including additional workup including a PET scan, need for a Port-A-Cath and different chemotherapy options possible adverse effects and limitations 2 doses of chemotherapy that can be used as a result of her age. Patient understands and is able to repeat back the understanding of these elements of care and would like to proceed with the most appropriate treatment.  She received her first cycle of R-mini CHOP on 08/26/2016 and had outpatient Neulasta shot since then.  INTERVAL HISTORY  Patient is here for her followup prior to her 2nd cycle of R-mini CHOP chemotherapy. He notes he is eating a little better but still with very limited appetite. She was given a referral to the dietitian. We discussed and set her up for a one-week follow-up with repeat labs and appointment for IV fluids since she was admitted previously after the first cycle for dehydration. She is agreeable with this plan. No fevers no chills no night sweats. No overt new abdominal pain.   MEDICAL HISTORY:  Past Medical History:  Diagnosis Date  . Arthritis   . Diffuse large B-cell lymphoma of lymph nodes of multiple regions ([place]) 08/22/2016  . Hypercholesteremia   . Hypertension    FOLLOWED BY DR Katherine Roan  . Mitral valve prolapse -  severe MR 09/20/2016   Posterior Leaflet  . Severe mitral regurgitation: Per 2 D echo 08/23/2016 08/23/2016   .  posterior leaflet prolapse of mitral valve with anteriorly directed mitral regurgitation. Severe. Severely dilated left atrium.  . Severe protein-calorie malnutrition ([place])     SURGICAL HISTORY: Past Surgical History:  Procedure Laterality Date  . ABDOMINAL HYSTERECTOMY    . IR FLUORO GUIDE PORT INSERTION RIGHT  08/23/2016  . IR US GUIDE VASC ACCESS RIGHT   08/23/2016  . JOINT REPLACEMENT    . 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 TOTAL HIP REVISION  05/02/2011   Procedure: TOTAL HIP REVISION;  Surgeon: Sharmon Revere, MD;  Location: [facility];  Service: Orthopedics;  Laterality: Left;  left total hip revision with bone grafting  . TRANSTHORACIC ECHOCARDIOGRAM  08/23/2013    EF 60-65%. GR 2 DD.posterior leaflet prolapse of mitral valve with anteriorly directed mitral regurgitation. Severe. Severely dilated left atrium.    SOCIAL HISTORY: Social History   Social History  . Marital status: Legally Separated    Spouse name: N/A  . Number of children: 4  . Years of education: 12   Occupational History  . Not on file.   Social History Main Topics  . Smoking status: Never Smoker  . Smokeless tobacco: Never Used  . Alcohol use No  . Drug use: No  . Sexual activity: Not on file   Other Topics Concern  . Not on file   Social History Narrative   She lives alone in an apartment.  She relies on family members for transportation & helping her figure out her medications.   She has listed married, but reportedly she is legally separated.   They have 4 children and 5 grandchildren and 5 great-grandchildren.   Has Social Security & DSS assistance with food stamps & food banks.      FAMILY HISTORY: Family History  Problem Relation Age of Onset  . Heart disease Mother   . Hypertension Mother   . Heart attack Mother 40  . Hypertension Father   . Brain cancer Father   . Brain cancer Sister   . Hypertension Sister   . Stroke Sister   . Alcohol abuse Brother   . Depression Daughter   . Hypertension Daughter   . Thyroid disease Daughter   . Migraines Daughter     ALLERGIES:  has No Known Allergies.  MEDICATIONS:  Current Outpatient Prescriptions  Medication Sig Dispense Refill  . hydrOXYzine (ATARAX/VISTARIL) 25 MG tablet Take 25 mg by mouth 3 (three) times daily as needed.    . docusate sodium (COLACE) 100 MG capsule Take 1 capsule  (100 mg total) by mouth every 12 (twelve) hours. 60 capsule 1  . dronabinol (MARINOL) 2.5 MG capsule Take 1 capsule (2.5 mg total) by mouth 2 (two) times daily before a meal. 60 capsule 0  . feeding supplement, ENSURE ENLIVE, (ENSURE ENLIVE) LIQD Take 237 mLs by mouth 2 (two) times daily between meals. (Patient taking differently: Take 237 mLs by mouth 2 (two) times daily as needed (nutritional supplement). ) 4500 mL 3  . lidocaine-prilocaine (EMLA) cream Apply to port 1-2 hours before needle access. Cover with plastic wrap. 30 g 1  . losartan (COZAAR) 50 MG tablet Take 1 tablet (50 mg total) by mouth daily. 30 tablet 1  . magnesium oxide (MAG-OX) 400 (241.3 Mg) MG tablet Take 1 tablet (400 mg total) by mouth 2 (two) times daily. 60 tablet 0  . metoprolol tartrate (LOPRESSOR) 25 MG tablet Take 0.5 tablets (12.5 mg total) by mouth 2 (two) times daily. [place]  tablet 0  . ondansetron (ZOFRAN) 8 MG tablet Take 1 tablet (8 mg total) by mouth every 8 (eight) hours as needed for nausea or vomiting. 30 tablet 0  . oxyCODONE (OXY IR/ROXICODONE) 5 MG immediate release tablet Take 1-2 tablets (5-10 mg total) by mouth every 4 (four) hours as needed for severe pain or breakthrough pain. 60 tablet 0  . pantoprazole (PROTONIX) 20 MG tablet Take 1 tablet (20 mg total) by mouth daily. 30 tablet 0  . polyethylene glycol (MIRALAX / GLYCOLAX) packet Take 17 g by mouth daily. 28 each 1  . potassium chloride SA (K-DUR,KLOR-CON) 20 MEQ tablet Take 1 tablet (20 mEq total) by mouth 2 (two) times daily. 10 tablet 0  . predniSONE (DELTASONE) 20 MG tablet Take 3 tablets (60 mg total) by mouth See admin instructions. 60 mg po daily Day 1 to Day 5 of each chemotherapy cycle 30 tablet 3  . prochlorperazine (COMPAZINE) 10 MG tablet Take 1 tablet (10 mg total) by mouth every 6 (six) hours as needed for nausea or vomiting. 30 tablet 0  . senna-docusate (SENNA S) 8.6-50 MG tablet Take 2 tablets by mouth 2 (two) times daily. Reduce to 2tab  po HS if diarrhea occurs 60 tablet 2  . sucralfate (CARAFATE) 1 g tablet Take 1 tablet (1 g total) by mouth 4 (four) times daily. 30 tablet 0   No current facility-administered medications for this visit.    Facility-Administered Medications Ordered in Other Visits  Medication Dose Route Frequency Provider Last Rate Last Dose  . ondansetron (ZOFRAN-ODT) disintegrating tablet 8 mg  8 mg Oral Once Brunetta Genera, MD        REVIEW OF SYSTEMS:    10 Point review of Systems was done is negative except as noted above.  PHYSICAL EXAMINATION: ECOG PERFORMANCE STATUS: 2 - Symptomatic, <50% confined to bed  . Vitals:   09/26/16 0901  BP: (!) 100/56  Pulse: 65  Resp: 18  Temp: 97.8 F (36.6 C)   Filed Weights   09/26/16 0901  Weight: 100 lb 12.8 oz (45.7 kg)   .Body mass index is 20.36 kg/m.  GENERAL:alert, in no acute distress and comfortable SKIN: no acute rashes, no significant lesions EYES: conjunctiva are pink and non-injected, sclera anicteric OROPHARYNX: MMM, no exudates, no oropharyngeal erythema or ulceration NECK: supple, no JVD LYMPH:  b/l palpable supraclavicular LN's, no palpable cervical LNadenopathy. LUNGS: clear to auscultation b/l with normal respiratory effort HEART: regular rate & rhythm, 3 x 6 systolic murmur over the sternum and aortic area  ABDOMEN:  normoactive bowel sounds , non tender, not distended. Extremity: no pedal edema PSYCH: alert & oriented x 3 with fluent speech NEURO: no focal motor/sensory deficits   LABORATORY DATA:  I have reviewed the data as listed  Component     Latest Ref Rng & Units 09/26/2016  WBC     3.9 - 10.3 10e3/uL 5.6  NEUT#     1.5 - 6.5 10e3/uL 4.1  Hemoglobin     11.6 - 15.9 g/dL 9.9 (L)  HCT     34.8 - 46.6 % 31.7 (L)  Platelets     145 - 400 10e3/uL 169  MCV     79.5 - 101.0 fL 89.5  MCH     25.1 - 34.0 pg 28.0  MCHC     31.5 - 36.0 g/dL 31.2 (L)  RBC     3.70 - 5.45 10e6/uL 3.54 (L)  RDW  11.2  - 14.5 % 16.6 (H)  lymph#     0.9 - 3.3 10e3/uL 0.7 (L)  MONO#     0.1 - 0.9 10e3/uL 0.5  Eosinophils Absolute     0.0 - 0.5 10e3/uL 0.3  Basophils Absolute     0.0 - 0.1 10e3/uL 0.0  NEUT%     38.4 - 76.8 % 72.7  LYMPH%     14.0 - 49.7 % 12.9 (L)  MONO%     0.0 - 14.0 % 8.8  EOS%     0.0 - 7.0 % 5.2  BASO%     0.0 - 2.0 % 0.4  Retic %     0.70 - 2.10 % 1.45  Retic Ct Abs     33.70 - 90.70 10e3/uL 51.33  Immature Retic Fract     1.60 - 10.00 % 5.60  Sodium     136 - 145 mEq/L 138  Potassium     3.5 - 5.1 mEq/L 4.5  Chloride     98 - 109 mEq/L 102  CO2     22 - 29 mEq/L 27  Glucose     70 - 140 mg/dl 94  BUN     7.0 - 26.0 mg/dL 7.3  Creatinine     0.6 - 1.1 mg/dL 0.7  Total Bilirubin     0.20 - 1.20 mg/dL 0.79  Alkaline Phosphatase     40 - 150 U/L 83  AST     5 - 34 U/L 18  ALT     0 - 55 U/L 8  Total Protein     6.4 - 8.3 g/dL 5.9 (L)  Albumin     3.5 - 5.0 g/dL 3.2 (L)  Calcium     8.4 - 10.4 mg/dL 9.7  Anion gap     3 - 11 mEq/L 9  EGFR     >90 ml/min/1.73 m2 >90  LDH     125 - 245 U/L 242  Magnesium     1.5 - 2.5 mg/dl 0.9 (LL)  Glucose-Capillary     65 - 99 mg/dL     ------------------------------------------------------------------- LV EF: 60% - 65%  ------------------------------------------------------------------- Indications: (Chemo I74.9).  ------------------------------------------------------------------- History: PMH: No prior cardiac history. Risk factors: Dyslipidemia.  ------------------------------------------------------------------- Study Conclusions  - Left ventricle: The cavity size was normal. Wall thickness was normal. Systolic function was normal. The estimated ejection fraction was in the range of 60% to 65%. Features are consistent with a pseudonormal left ventricular filling pattern, with concomitant abnormal relaxation and increased filling pressure (grade 2 diastolic  dysfunction). Doppler parameters are consistent with high ventricular filling pressure. - Aortic valve: There was trivial regurgitation. - Mitral valve: Prolapse of the posterior milral leaflet MR is anteriorly directed and wraps aound wall of LA MR is severe. - Left atrium: The atrium was severely dilated. - Right atrium: The atrium was mildly to moderately dilated. - Pulmonary arteries: PA peak pressure: 49 mm Hg (S).  RADIOGRAPHIC STUDIES: I have personally reviewed the radiological images as listed and agreed with the findings in the report.  ASSESSMENT & PLAN:  76 yo with   #1 Newly diagnosed at least stage IIIB diffuse large B-cell lymphoma - and likely germinal center phenotype . Patient has extensive retroperitoneal mesenteric and periportal lymphadenopathy along with adenopathy in the chest and supraclavicular areas and a focal lesion in the spleen.   Patient received first cycle of R-mini CHOP on 08/26/2016 and received outpatient neulasta.  #2 Hypomagnesemia  Mg 0.9 Plan -Patient notes some grade 1 fatigue and grade 2 anorexia. -Continue same supportive medications -proceed with next cycle of R-CHOP scheduled  today with G-CSF support. -magnesium replacement orders. -RTC in 1 week with rpt labs and for IV fluids  #2 Right lower pole of the kidney lesion ? Renal cell carcinoma versus involvement by lymphoma . Plan -Will hold off on biopsy at this time. -We'll reassess response of this lesion on repeat PET/CT scan after 3 cycles of treatment. If this resolves or shrinks would suggest diagnosis of lymphoma. If persistent or increased in size we will need to decide on appropriate management for this.  #3 significant fatigue and failure to thrive due to poor oral intake related lymphoma and type B constitutional symptoms . Plan -Given referral to [facility]/dietitian to address her nutritional requirements.  #4 neoplasm related pain currently uncontrolled. Has  required oxycodone around the clock every 4 hours. Plan -continue patient on OxyContin 10 mg by mouth every 12 hours -Oxycodone 5-10 mg every 4 hours as needed for breakthrough pain  #5 severe mitral regurgitation normal ejection fraction -Has been given a referral for outpatient cardiology on recent discharge from the hospital.  Continue R-mini CHOP per schedule Urgent referral to dietician RTC with Dr Irene Limbo in 7 days with rpt labs and appointment for IV fluids  All of the patients questions were answered with apparent satisfaction. The patient knows to call the clinic with any problems, questions or concerns.  I spent 20 minutes counseling the patient face to face. The total time spent in the appointment was 25 minutes and more than 50% was on counseling and direct patient cares.    Sullivan Lone MD [place] AAHIVMS [facility] Hematology/Oncology Physician [facility]  (Office):       [phone] (Work cell):  [phone] (Fax):           [phone]

## 2016-11-11 NOTE — Progress Notes (Signed)
Per Aldona Bar RN Per Dr. Irene Limbo, okay to proceed with treatment with Magnesium 1.4, pt to receive 2 grams of Magnesium, pt aware and verbalizes understanding.

## 2016-11-11 NOTE — Progress Notes (Signed)
Per Dr. Irene Limbo okay to tx with Mg 1.4. Would like pt to receive 2g magnesium over 1 hour if possible.

## 2016-11-11 NOTE — Patient Instructions (Signed)
Thank you for choosing [facility] to provide your oncology and hematology care.  To afford each patient quality time with our providers, please arrive 30 minutes before your scheduled appointment time.  If you arrive late for your appointment, you may be asked to reschedule.  We strive to give you quality time with our providers, and arriving late affects you and other patients whose appointments are after yours.  If you are a no show for multiple scheduled visits, you may be dismissed from the clinic at the providers discretion.   Again, thank you for choosing [facility], our hope is that these requests will decrease the amount of time that you wait before being seen by our physicians.  ______________________________________________________________________ Should you have questions after your visit to the [facility], please contact our office at [phone] between the hours of 8:30 and 4:30 p.m.    Voicemails left after 4:30p.m will not be returned until the following business day.   For prescription refill requests, please have your pharmacy contact us directly.  Please also try to allow 48 hours for prescription requests.   Please contact the scheduling department for questions regarding scheduling.  For scheduling of procedures such as PET scans, CT scans, MRI, Ultrasound, etc please contact central scheduling at [phone].   Resources For Cancer Patients and Caregivers:  American Cancer Society:  800-227-2345  Can help patients locate various types of support and financial assistance Cancer Care: 1-800-813-HOPE (4673) Provides financial assistance, online support groups, medication/co-pay assistance.   [place] DSS:  336-641-3447 Where to apply for food stamps, Medicaid, and utility assistance Medicare Rights Center: 800-333-4114 Helps people with Medicare understand their rights and benefits, navigate the Medicare system, and secure the  quality healthcare they deserve SCAT: 336-333-6589 [employer] [employer]'s shared-ride transportation service for eligible riders who have a disability that prevents them from riding the fixed route bus.   For additional information on assistance programs please contact our social worker:   Grier Hock/Abigail Elmore:  [phone] 

## 2016-11-11 NOTE — Progress Notes (Signed)
[name] Kitchen    HEMATOLOGY/ONCOLOGY CLINIC NOTE  Date of Service: .10/07/2016  Patient Care Team: Smothers, Andree Elk, NP as PCP - General (Nurse Practitioner)  CHIEF COMPLAINTS/PURPOSE OF CONSULTATION:  Post hospitalization f/u for DLBCL Renal cell carcinoma ?  HISTORY OF PRESENTING ILLNESS:  Christina Peterson is a wonderful 76 y.o. female who has been referred to [place] by Dr Cathlean Sauer, Jimmy Picket,*   for evaluation and management of newly diagnosed Large B cell lymphoma.  Patient has a history of hypertension, dyslipidemia, arthritis but notes that she lives independently in a senior housing facility and does all her ADLs herself.  She notes that she has been having increasing abdominal pain and mid back pain for about 3-4 weeks and has had some increased fatigue over the last couple of months. She notes that she has lost about 7-8 pounds over the last 2-3 weeks. She was recently admitted to the hospital from 08/10/2016-08/15/2016 with abdominal pain and poor oral intake. She was noted to be dehydrated with hyponatremia and CT scan of the abdomen on 08/12/2016 showed lymphadenopathy within the chest and abdomen concerning for lymphoma. She also was noted to have a right renal mass in the lower pole of the right kidney measuring 3.6 cm concerning for renal cell carcinoma.  Patient had a CT-guided biopsy of her retroperitoneal lymph node on 08/14/2016 which shows large B-cell lymphoma. She was to be seen in the oncology clinic but got readmitted on 08/21/2016 with worsening abdominal pain nausea and decreased appetite.  She was noted to be significantly hyponatremic again and is currently on IV fluids. She notes her abdominal pain is better controlled with pain medications she is receiving. No fevers or chills. I was consulted to help with further evaluation and management of her newly diagnosed large B-cell lymphoma.  Patient notes abdominal and back pain and about an 8-10 pound weight loss in the last  few weeks and inability to keep much food down due to pain and anorexia. She has been living by herself and demonstrated failure to thrive.  We discussed the diagnosis, natural history, prognosis and treatment options in detail including additional workup including a PET scan, need for a Port-A-Cath and different chemotherapy options possible adverse effects and limitations 2 doses of chemotherapy that can be used as a result of her age. Patient understands and is able to repeat back the understanding of these elements of care and would like to proceed with the most appropriate treatment.  She received her first cycle of R-mini CHOP on 08/26/2016 and had outpatient Neulasta shot since then.  INTERVAL HISTORY  Patient is here for her followup a week after her 2nd cycle of R-mini CHOP chemotherapy. Was given Marinol 2.55m po BID to help  Boost appetite on her last visit and notes that she will try this. Her Oxycontin was switch to Morphine ER due to insurance coverage issues.  No fevers no chills no night sweats. No overt new abdominal pain. Magnesium improved from 0.9 to 1.4. No diarrhea.  MEDICAL HISTORY:  Past Medical History:  Diagnosis Date  . Arthritis   . Diffuse large B-cell lymphoma of lymph nodes of multiple regions [place] 08/22/2016  . Hypercholesteremia   . Hypertension    FOLLOWED BY DR KKatherine Roan . Mitral valve prolapse -  severe MR 09/20/2016   Posterior Leaflet  . Severe mitral regurgitation: Per 2 D echo 08/23/2016 08/23/2016   .posterior leaflet prolapse of mitral valve with anteriorly directed mitral regurgitation. Severe. Severely dilated  left atrium.  . Severe protein-calorie malnutrition ([place])     SURGICAL HISTORY: Past Surgical History:  Procedure Laterality Date  . ABDOMINAL HYSTERECTOMY    . IR FLUORO GUIDE PORT INSERTION RIGHT  08/23/2016  . IR US GUIDE VASC ACCESS RIGHT  08/23/2016  . JOINT REPLACEMENT    . 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 TOTAL HIP  REVISION  05/02/2011   Procedure: TOTAL HIP REVISION;  Surgeon: Sharmon Revere, MD;  Location: [facility];  Service: Orthopedics;  Laterality: Left;  left total hip revision with bone grafting  . TRANSTHORACIC ECHOCARDIOGRAM  08/23/2013    EF 60-65%. GR 2 DD.posterior leaflet prolapse of mitral valve with anteriorly directed mitral regurgitation. Severe. Severely dilated left atrium.    SOCIAL HISTORY: Social History   Social History  . Marital status: Legally Separated    Spouse name: N/A  . Number of children: 4  . Years of education: 12   Occupational History  . Not on file.   Social History Main Topics  . Smoking status: Never Smoker  . Smokeless tobacco: Never Used  . Alcohol use No  . Drug use: No  . Sexual activity: Not on file   Other Topics Concern  . Not on file   Social History Narrative   She lives alone in an apartment.  She relies on family members for transportation & helping her figure out her medications.   She has listed married, but reportedly she is legally separated.   They have 4 children and 5 grandchildren and 5 great-grandchildren.   Has Social Security & DSS assistance with food stamps & food banks.      FAMILY HISTORY: Family History  Problem Relation Age of Onset  . Heart disease Mother   . Hypertension Mother   . Heart attack Mother 68  . Hypertension Father   . Brain cancer Father   . Brain cancer Sister   . Hypertension Sister   . Stroke Sister   . Alcohol abuse Brother   . Depression Daughter   . Hypertension Daughter   . Thyroid disease Daughter   . Migraines Daughter     ALLERGIES:  has No Known Allergies.  MEDICATIONS:  Current Outpatient Prescriptions  Medication Sig Dispense Refill  . docusate sodium (COLACE) 100 MG capsule Take 1 capsule (100 mg total) by mouth every 12 (twelve) hours. 60 capsule 1  . dronabinol (MARINOL) 2.5 MG capsule Take 1 capsule (2.5 mg total) by mouth 2 (two) times daily before a meal. 60 capsule 0  .  feeding supplement, ENSURE ENLIVE, (ENSURE ENLIVE) LIQD Take 237 mLs by mouth 2 (two) times daily between meals. (Patient taking differently: Take 237 mLs by mouth 2 (two) times daily as needed (nutritional supplement). ) 4500 mL 3  . hydrOXYzine (ATARAX/VISTARIL) 25 MG tablet Take 25 mg by mouth 3 (three) times daily as needed.    . lidocaine-prilocaine (EMLA) cream Apply to port 1-2 hours before needle access. Cover with plastic wrap. 30 g 1  . losartan (COZAAR) 50 MG tablet Take 1 tablet (50 mg total) by mouth daily. 30 tablet 1  . magnesium oxide (MAG-OX) 400 (241.3 Mg) MG tablet Take 1 tablet (400 mg total) by mouth 2 (two) times daily. 60 tablet 0  . metoprolol tartrate (LOPRESSOR) 25 MG tablet Take 0.5 tablets (12.5 mg total) by mouth 2 (two) times daily. 60 tablet 0  . ondansetron (ZOFRAN) 8 MG tablet Take 1 tablet (8 mg  total) by mouth every 8 (eight) hours as needed for nausea or vomiting. 30 tablet 0  . oxyCODONE (OXY IR/ROXICODONE) 5 MG immediate release tablet Take 1-2 tablets (5-10 mg total) by mouth every 4 (four) hours as needed for severe pain or breakthrough pain. 60 tablet 0  . pantoprazole (PROTONIX) 20 MG tablet Take 1 tablet (20 mg total) by mouth daily. 30 tablet 0  . polyethylene glycol (MIRALAX / GLYCOLAX) packet Take 17 g by mouth daily. 28 each 1  . potassium chloride SA (K-DUR,KLOR-CON) 20 MEQ tablet Take 1 tablet (20 mEq total) by mouth 2 (two) times daily. 10 tablet 0  . predniSONE (DELTASONE) 20 MG tablet Take 3 tablets (60 mg total) by mouth See admin instructions. 60 mg po daily Day 1 to Day 5 of each chemotherapy cycle 30 tablet 3  . prochlorperazine (COMPAZINE) 10 MG tablet Take 1 tablet (10 mg total) by mouth every 6 (six) hours as needed for nausea or vomiting. 30 tablet 0  . senna-docusate (SENNA S) 8.6-50 MG tablet Take 2 tablets by mouth 2 (two) times daily. Reduce to 2tab po HS if diarrhea occurs 60 tablet 2  . sucralfate (CARAFATE) 1 g tablet Take 1 tablet (1 g  total) by mouth 4 (four) times daily. 30 tablet 0   No current facility-administered medications for this visit.    Facility-Administered Medications Ordered in Other Visits  Medication Dose Route Frequency Provider Last Rate Last Dose  . ondansetron (ZOFRAN-ODT) disintegrating tablet 8 mg  8 mg Oral Once Brunetta Genera, MD        REVIEW OF SYSTEMS:    10 Point review of Systems was done is negative except as noted above.  PHYSICAL EXAMINATION: ECOG PERFORMANCE STATUS: 2 - Symptomatic, <50% confined to bed  . Vitals:   10/07/16 1016  BP: (!) 101/44  Pulse: 87  Resp: 16  Temp: 98.7 F (37.1 C)   Filed Weights   10/07/16 1016  Weight: 101 lb 11.2 oz (46.1 kg)   .Body mass index is 20.54 kg/m.  GENERAL:alert, in no acute distress and comfortable SKIN: no acute rashes, no significant lesions EYES: conjunctiva are pink and non-injected, sclera anicteric OROPHARYNX: MMM, no exudates, no oropharyngeal erythema or ulceration NECK: supple, no JVD LYMPH:  b/l palpable supraclavicular LN's, no palpable cervical LNadenopathy. LUNGS: clear to auscultation b/l with normal respiratory effort HEART: regular rate & rhythm, 3 x 6 systolic murmur over the sternum and aortic area  ABDOMEN:  normoactive bowel sounds , non tender, not distended. Extremity: no pedal edema PSYCH: alert & oriented x 3 with fluent speech NEURO: no focal motor/sensory deficits   LABORATORY DATA:  I have reviewed the data as listed  Component     Latest Ref Rng & Units 10/07/2016  WBC     3.9 - 10.3 10e3/uL 16.0 (H)  NEUT#     1.5 - 6.5 10e3/uL 13.8 (H)  Hemoglobin     11.6 - 15.9 g/dL 10.2 (L)  HCT     34.8 - 46.6 % 32.9 (L)  Platelets     145 - 400 10e3/uL 195  MCV     79.5 - 101.0 fL 89.6  MCH     25.1 - 34.0 pg 27.8  MCHC     31.5 - 36.0 g/dL 31.0 (L)  RBC     3.70 - 5.45 10e6/uL 3.67 (L)  RDW     11.2 - 14.5 % 17.9 (H)  lymph#  0.9 - 3.3 10e3/uL 1.1  MONO#     0.1 - 0.9  10e3/uL 0.8  Eosinophils Absolute     0.0 - 0.5 10e3/uL 0.3  Basophils Absolute     0.0 - 0.1 10e3/uL 0.1  NEUT%     38.4 - 76.8 % 86.1 (H)  LYMPH%     14.0 - 49.7 % 7.0 (L)  MONO%     0.0 - 14.0 % 4.8  EOS%     0.0 - 7.0 % 1.8  BASO%     0.0 - 2.0 % 0.3  nRBC     0 - 0 % 0  Retic %     0.70 - 2.10 % 3.78 (H)  Retic Ct Abs     33.70 - 90.70 10e3/uL 138.73 (H)  Immature Retic Fract     1.60 - 10.00 % 17.30 (H)  Sodium     136 - 145 mEq/L 142  Potassium     3.5 - 5.1 mEq/L 4.6  Chloride     98 - 109 mEq/L 103  CO2     22 - 29 mEq/L 29  Glucose     70 - 140 mg/dl 100  BUN     7.0 - 26.0 mg/dL 7.1  Creatinine     0.6 - 1.1 mg/dL 0.8  Total Bilirubin     0.20 - 1.20 mg/dL 0.55  Alkaline Phosphatase     40 - 150 U/L 106  AST     5 - 34 U/L 14  ALT     0 - 55 U/L 9  Total Protein     6.4 - 8.3 g/dL 5.6 (L)  Albumin     3.5 - 5.0 g/dL 3.3 (L)  Calcium     8.4 - 10.4 mg/dL 9.7  Anion gap     3 - 11 mEq/L 10  EGFR     >90 ml/min/1.73 m2 85 (L)  Magnesium     1.5 - 2.5 mg/dl 1.4 (LL)    ------------------------------------------------------------------- LV EF: 60% - 65%  ------------------------------------------------------------------- Indications: (Chemo I74.9).  ------------------------------------------------------------------- History: PMH: No prior cardiac history. Risk factors: Dyslipidemia.  ------------------------------------------------------------------- Study Conclusions  - Left ventricle: The cavity size was normal. Wall thickness was normal. Systolic function was normal. The estimated ejection fraction was in the range of 60% to 65%. Features are consistent with a pseudonormal left ventricular filling pattern, with concomitant abnormal relaxation and increased filling pressure (grade 2 diastolic dysfunction). Doppler parameters are consistent with high ventricular filling pressure. - Aortic valve: There  was trivial regurgitation. - Mitral valve: Prolapse of the posterior milral leaflet MR is anteriorly directed and wraps aound wall of LA MR is severe. - Left atrium: The atrium was severely dilated. - Right atrium: The atrium was mildly to moderately dilated. - Pulmonary arteries: PA peak pressure: 49 mm Hg (S).  RADIOGRAPHIC STUDIES: I have personally reviewed the radiological images as listed and agreed with the findings in the report.  ASSESSMENT & PLAN:  76 yo with   #1 Newly diagnosed at least stage IIIB diffuse large B-cell lymphoma - and likely germinal center phenotype . Patient has extensive retroperitoneal mesenteric and periportal lymphadenopathy along with adenopathy in the chest and supraclavicular areas and a focal lesion in the spleen.   Patient received first cycle of R-mini CHOP on 08/26/2016 and received outpatient neulasta. Patient received 2nd  cycle of R-mini CHOP on 09/26/2016 and received outpatient neulasta. #2 Hypomagnesemia Mg 0.9--now improved to 1.4 Plan -  magnesium replacement ordered -IVF for dehydration.  #2 Right lower pole of the kidney lesion ? Renal cell carcinoma versus involvement by lymphoma . Plan -Will hold off on biopsy at this time. -We'll reassess response of this lesion on repeat PET/CT scan after 3 cycles of treatment. If this resolves or shrinks would suggest diagnosis of lymphoma. If persistent or increased in size we will need to decide on appropriate management for this.  #3 significant fatigue and failure to thrive due to poor oral intake related lymphoma and type B constitutional symptoms . Plan -Given referral to [facility]/dietitian to address her nutritional requirements.  #4 neoplasm related pain currently uncontrolled. Has required oxycodone around the clock every 4 hours. Plan -continue patient on MS Contin 15 mg by mouth every 12 hours -Oxycodone 5-10 mg every 4 hours as needed for breakthrough pain  #5 severe  mitral regurgitation normal ejection fraction -Has been given a referral for outpatient cardiology on recent discharge from the hospital.  -continue Treatment as per schedule. Plz schedule C3,4,5 and 6 _IVF today -PET/CT in 3 weeks -RTC with Dr Irene Limbo with C4D1 of treatment with labs and PET/CT  All of the patients questions were answered with apparent satisfaction. The patient knows to call the clinic with any problems, questions or concerns.  I spent 20 minutes counseling the patient face to face. The total time spent in the appointment was 25 minutes and more than 50% was on counseling and direct patient cares.    Sullivan Lone MD [place] AAHIVMS [facility] [employer] [facility] Hematology/Oncology Physician [facility]  (Office):       [phone] (Work cell):  [phone] (Fax):           [phone]

## 2016-11-11 NOTE — Progress Notes (Signed)
Nutrition follow-up completed with patient during infusion for B-cell lymphoma. Weight decreased and documented as 95.3 pounds on July 30, down from 101.7 pounds June 25. Patient does not understand why she has lost weight. States her appetite has improved. She is eating more snacks. She no longer takes a stool softener. Reports she continues to take appetite stimulant. Patient dislikes oral nutrition supplements.  Nutrition diagnosis: Severe malnutrition continues.  Intervention: Patient was educated to increase calories and protein at mealtimes and snack times. Patient should continue whole milk instead of 2% milk. Reviewed high-calorie high-protein foods. Questions were answered.  Teach back method used.  Monitoring, evaluation, goals:  Patient will increase calories and protein to minimize further weight loss.  Next visit: Monday, August 20, during infusion.  **Disclaimer: This note was dictated with voice recognition software. Similar sounding words can inadvertently be transcribed and this note may contain transcription errors which may not have been corrected upon publication of note.**

## 2016-11-11 NOTE — Patient Instructions (Signed)
[facility] Discharge Instructions for Patients Receiving Chemotherapy  Today you received the following chemotherapy agents: Adriamycin, Vincristine, Cytoxan and Rituxan   To help prevent nausea and vomiting after your treatment, we encourage you to take your nausea medication as directed.    If you develop nausea and vomiting that is not controlled by your nausea medication, call the clinic.   BELOW ARE SYMPTOMS THAT SHOULD BE REPORTED IMMEDIATELY:  *FEVER GREATER THAN 100.5 F  *CHILLS WITH OR WITHOUT FEVER  NAUSEA AND VOMITING THAT IS NOT CONTROLLED WITH YOUR NAUSEA MEDICATION  *UNUSUAL SHORTNESS OF BREATH  *UNUSUAL BRUISING OR BLEEDING  TENDERNESS IN MOUTH AND THROAT WITH OR WITHOUT PRESENCE OF ULCERS  *URINARY PROBLEMS  *BOWEL PROBLEMS  UNUSUAL RASH Items with * indicate a potential emergency and should be followed up as soon as possible.  Feel free to call the clinic you have any questions or concerns. The clinic phone number is [phone].  Please show the [place] at check-in to the Emergency Department and triage nurse.    Hypomagnesemia Hypomagnesemia is a condition in which the level of magnesium in the blood is low. Magnesium is a mineral that is found in many foods. It is used in many different processes in the body. Hypomagnesemia can affect every organ in the body. It can cause life-threatening problems. What are the causes? Causes of hypomagnesemia include:  Not getting enough magnesium in your diet.  Malnutrition.  Problems with absorbing magnesium from the intestines.  Dehydration.  Alcohol abuse.  Vomiting.  Severe diarrhea.  Some medicines, including medicines that make you urinate more.  Certain diseases, such as kidney disease, diabetes, and overactive thyroid.  What are the signs or symptoms?  Involuntary shaking or trembling of a body part (tremor).  Confusion.  Muscle weakness.  Sensitivity to light,  sound, and touch.  Psychiatric issues, such as depression, irritability, or psychosis.  Sudden tightening of muscles (muscle spasms).  Tingling in the arms and legs.  A feeling of fluttering of the heart. These symptoms are more severe if magnesium levels drop suddenly. How is this diagnosed? To make a diagnosis, your health care provider will do a physical exam and order blood and urine tests. How is this treated? Treatment will depend on the cause and the severity of your condition. It may involve:  A magnesium supplement. This can be taken in pill form. It can also be given through an IV tube. This is usually done if the condition is severe.  Changes to your diet. You may be directed to eat foods that have a lot of magnesium, such as green leafy vegetables, peas, beans, and nuts.  Eliminating alcohol from your diet.  Follow these instructions at home:  Include foods with magnesium in your diet. Foods that are rich in magnesium include green vegetables, beans, nuts and seeds, and whole grains.  Take medicines only as directed by your health care provider.  Take magnesium supplements if your health care provider instructs you to do that. Take them as directed.  Have your magnesium levels monitored as directed by your health care provider.  When you are active, drink fluids that contain electrolytes.  Keep all follow-up visits as directed by your health care provider. This is important. Contact a health care provider if:  You get worse instead of better.  Your symptoms return. Get help right away if:  Your symptoms are severe. This information is not intended to replace advice given to you  by your health care provider. Make sure you discuss any questions you have with your health care provider. Document Released: 12/26/2004 Document Revised: 09/07/2015 Document Reviewed: 11/15/2013 Elsevier Interactive Patient Education  2018 [employer].

## 2016-11-11 NOTE — Telephone Encounter (Signed)
Appointments complete per 7/30 los. Patient will get copy in infusion

## 2016-11-13 ENCOUNTER — Ambulatory Visit (HOSPITAL_BASED_OUTPATIENT_CLINIC_OR_DEPARTMENT_OTHER): Payer: Medicare Other

## 2016-11-13 VITALS — BP 129/80 | HR 73 | Temp 97.5°F | Resp 20

## 2016-11-13 DIAGNOSIS — Z5189 Encounter for other specified aftercare: Secondary | ICD-10-CM

## 2016-11-13 DIAGNOSIS — C8338 Diffuse large B-cell lymphoma, lymph nodes of multiple sites: Secondary | ICD-10-CM | POA: Diagnosis present

## 2016-11-13 MED ORDER — PEGFILGRASTIM INJECTION 6 MG/0.6ML ~~LOC~~
6.0000 mg | PREFILLED_SYRINGE | Freq: Once | SUBCUTANEOUS | Status: AC
  Administered 2016-11-13: 6 mg via SUBCUTANEOUS
  Filled 2016-11-13: qty 0.6

## 2016-11-13 NOTE — Patient Instructions (Signed)
Pegfilgrastim injection What is this medicine? PEGFILGRASTIM (PEG fil gra stim) is a long-acting granulocyte colony-stimulating factor that stimulates the growth of neutrophils, a type of white blood cell important in the body's fight against infection. It is used to reduce the incidence of fever and infection in patients with certain types of cancer who are receiving chemotherapy that affects the bone marrow, and to increase survival after being exposed to high doses of radiation. This medicine may be used for other purposes; ask your health care provider or pharmacist if you have questions. COMMON BRAND NAME(S): Neulasta What should I tell my health care provider before I take this medicine? They need to know if you have any of these conditions: -kidney disease -latex allergy -ongoing radiation therapy -sickle cell disease -skin reactions to acrylic adhesives (On-Body Injector only) -an unusual or allergic reaction to pegfilgrastim, filgrastim, other medicines, foods, dyes, or preservatives -pregnant or trying to get pregnant -breast-feeding How should I use this medicine? This medicine is for injection under the skin. If you get this medicine at home, you will be taught how to prepare and give the pre-filled syringe or how to use the On-body Injector. Refer to the patient Instructions for Use for detailed instructions. Use exactly as directed. Tell your healthcare provider immediately if you suspect that the On-body Injector may not have performed as intended or if you suspect the use of the On-body Injector resulted in a missed or partial dose. It is important that you put your used needles and syringes in a special sharps container. Do not put them in a trash can. If you do not have a sharps container, call your pharmacist or healthcare provider to get one. Talk to your pediatrician regarding the use of this medicine in children. While this drug may be prescribed for selected conditions,  precautions do apply. Overdosage: If you think you have taken too much of this medicine contact a poison control center or emergency room at once. NOTE: This medicine is only for you. Do not share this medicine with others. What if I miss a dose? It is important not to miss your dose. Call your doctor or health care professional if you miss your dose. If you miss a dose due to an On-body Injector failure or leakage, a new dose should be administered as soon as possible using a single prefilled syringe for manual use. What may interact with this medicine? Interactions have not been studied. Give your health care provider a list of all the medicines, herbs, non-prescription drugs, or dietary supplements you use. Also tell them if you smoke, drink alcohol, or use illegal drugs. Some items may interact with your medicine. This list may not describe all possible interactions. Give your health care provider a list of all the medicines, herbs, non-prescription drugs, or dietary supplements you use. Also tell them if you smoke, drink alcohol, or use illegal drugs. Some items may interact with your medicine. What should I watch for while using this medicine? You may need blood work done while you are taking this medicine. If you are going to need a MRI, CT scan, or other procedure, tell your doctor that you are using this medicine (On-Body Injector only). What side effects may I notice from receiving this medicine? Side effects that you should report to your doctor or health care professional as soon as possible: -allergic reactions like skin rash, itching or hives, swelling of the face, lips, or tongue -dizziness -fever -pain, redness, or irritation at site   where injected -pinpoint red spots on the skin -red or dark-brown urine -shortness of breath or breathing problems -stomach or side pain, or pain at the shoulder -swelling -tiredness -trouble passing urine or change in the amount of urine Side  effects that usually do not require medical attention (report to your doctor or health care professional if they continue or are bothersome): -bone pain -muscle pain This list may not describe all possible side effects. Call your doctor for medical advice about side effects. You may report side effects to FDA at 1-800-FDA-1088. Where should I keep my medicine? Keep out of the reach of children. Store pre-filled syringes in a refrigerator between 2 and 8 degrees C (36 and 46 degrees F). Do not freeze. Keep in carton to protect from light. Throw away this medicine if it is left out of the refrigerator for more than 48 hours. Throw away any unused medicine after the expiration date. NOTE: This sheet is a summary. It may not cover all possible information. If you have questions about this medicine, talk to your doctor, pharmacist, or health care provider.  2018 [employer] (2016-03-28 12:58:03)  

## 2016-11-28 ENCOUNTER — Telehealth: Payer: Self-pay

## 2016-11-28 ENCOUNTER — Other Ambulatory Visit: Payer: Self-pay

## 2016-11-28 MED ORDER — MAGNESIUM OXIDE 400 (241.3 MG) MG PO TABS: 400.0000 mg | ORAL_TABLET | Freq: Two times a day (BID) | ORAL | 0 refills | Status: DC

## 2016-11-28 NOTE — Telephone Encounter (Signed)
Error

## 2016-11-28 NOTE — Telephone Encounter (Signed)
Confirmed pharmacy with pt. Magnesium continues to run low, so prescription refilled. Additional question, but pt states, "I will talk to the doctor about that the next time I see him."

## 2016-12-02 ENCOUNTER — Encounter: Payer: Self-pay | Admitting: Hematology

## 2016-12-02 ENCOUNTER — Ambulatory Visit: Payer: Medicare Other | Admitting: Nutrition

## 2016-12-02 ENCOUNTER — Ambulatory Visit (HOSPITAL_BASED_OUTPATIENT_CLINIC_OR_DEPARTMENT_OTHER): Payer: Medicare Other

## 2016-12-02 ENCOUNTER — Other Ambulatory Visit (HOSPITAL_BASED_OUTPATIENT_CLINIC_OR_DEPARTMENT_OTHER): Payer: Medicare Other

## 2016-12-02 ENCOUNTER — Other Ambulatory Visit: Payer: Self-pay | Admitting: Hematology

## 2016-12-02 ENCOUNTER — Ambulatory Visit (HOSPITAL_BASED_OUTPATIENT_CLINIC_OR_DEPARTMENT_OTHER): Payer: Medicare Other | Admitting: Hematology

## 2016-12-02 VITALS — BP 112/62 | HR 73 | Temp 98.1°F | Resp 17 | Ht 59.0 in | Wt 94.2 lb

## 2016-12-02 VITALS — BP 107/59 | HR 59 | Temp 98.4°F | Resp 18

## 2016-12-02 DIAGNOSIS — Z5111 Encounter for antineoplastic chemotherapy: Secondary | ICD-10-CM

## 2016-12-02 DIAGNOSIS — D649 Anemia, unspecified: Secondary | ICD-10-CM

## 2016-12-02 DIAGNOSIS — R627 Adult failure to thrive: Secondary | ICD-10-CM

## 2016-12-02 DIAGNOSIS — C8338 Diffuse large B-cell lymphoma, lymph nodes of multiple sites: Secondary | ICD-10-CM

## 2016-12-02 DIAGNOSIS — G893 Neoplasm related pain (acute) (chronic): Secondary | ICD-10-CM

## 2016-12-02 DIAGNOSIS — N289 Disorder of kidney and ureter, unspecified: Secondary | ICD-10-CM | POA: Diagnosis not present

## 2016-12-02 DIAGNOSIS — R530 Neoplastic (malignant) related fatigue: Secondary | ICD-10-CM

## 2016-12-02 DIAGNOSIS — Z5112 Encounter for antineoplastic immunotherapy: Secondary | ICD-10-CM | POA: Diagnosis present

## 2016-12-02 DIAGNOSIS — I340 Nonrheumatic mitral (valve) insufficiency: Secondary | ICD-10-CM | POA: Diagnosis not present

## 2016-12-02 LAB — CBC & DIFF AND RETIC
BASO%: 0.7 % (ref 0.0–2.0)
Basophils Absolute: 0.1 10*3/uL (ref 0.0–0.1)
EOS%: 1.6 % (ref 0.0–7.0)
Eosinophils Absolute: 0.1 10*3/uL (ref 0.0–0.5)
HCT: 29.7 % — ABNORMAL LOW (ref 34.8–46.6)
HGB: 9.2 g/dL — ABNORMAL LOW (ref 11.6–15.9)
IMMATURE RETIC FRACT: 13.3 % — AB (ref 1.60–10.00)
LYMPH#: 0.9 10*3/uL (ref 0.9–3.3)
LYMPH%: 9.5 % — ABNORMAL LOW (ref 14.0–49.7)
MCH: 28 pg (ref 25.1–34.0)
MCHC: 31 g/dL — AB (ref 31.5–36.0)
MCV: 90.3 fL (ref 79.5–101.0)
MONO#: 0.4 10*3/uL (ref 0.1–0.9)
MONO%: 4.8 % (ref 0.0–14.0)
NEUT%: 83.4 % — ABNORMAL HIGH (ref 38.4–76.8)
NEUTROS ABS: 7.5 10*3/uL — AB (ref 1.5–6.5)
PLATELETS: 437 10*3/uL — AB (ref 145–400)
RBC: 3.29 10*6/uL — AB (ref 3.70–5.45)
RDW: 19.4 % — AB (ref 11.2–14.5)
RETIC %: 2.1 % (ref 0.70–2.10)
RETIC CT ABS: 69.09 10*3/uL (ref 33.70–90.70)
WBC: 8.9 10*3/uL (ref 3.9–10.3)

## 2016-12-02 LAB — MAGNESIUM: MAGNESIUM: 1.5 mg/dL (ref 1.5–2.5)

## 2016-12-02 LAB — COMPREHENSIVE METABOLIC PANEL
ALT: 12 U/L (ref 0–55)
ANION GAP: 5 meq/L (ref 3–11)
AST: 17 U/L (ref 5–34)
Albumin: 3.2 g/dL — ABNORMAL LOW (ref 3.5–5.0)
Alkaline Phosphatase: 99 U/L (ref 40–150)
BUN: 7 mg/dL (ref 7.0–26.0)
CHLORIDE: 104 meq/L (ref 98–109)
CO2: 29 meq/L (ref 22–29)
CREATININE: 0.7 mg/dL (ref 0.6–1.1)
Calcium: 9.8 mg/dL (ref 8.4–10.4)
EGFR: >90 mL/min/{1.73_m2} (ref >90)
GLUCOSE: 92 mg/dL (ref 70–140)
Potassium: 4.4 mEq/L (ref 3.5–5.1)
SODIUM: 138 meq/L (ref 136–145)
TOTAL PROTEIN: 5.9 g/dL — AB (ref 6.4–8.3)
Total Bilirubin: 0.42 mg/dL (ref 0.20–1.20)

## 2016-12-02 LAB — LACTATE DEHYDROGENASE: LDH: 356 U/L — ABNORMAL HIGH (ref 125–245)

## 2016-12-02 MED ORDER — DOXORUBICIN HCL CHEMO IV INJECTION 2 MG/ML
50.0000 mg/m2 | Freq: Once | INTRAVENOUS | Status: AC
  Administered 2016-12-02: 72 mg via INTRAVENOUS
  Filled 2016-12-02: qty 36

## 2016-12-02 MED ORDER — VINCRISTINE SULFATE CHEMO INJECTION 1 MG/ML
1.0000 mg | Freq: Once | INTRAVENOUS | Status: AC
  Administered 2016-12-02: 1 mg via INTRAVENOUS
  Filled 2016-12-02: qty 1

## 2016-12-02 MED ORDER — PALONOSETRON HCL INJECTION 0.25 MG/5ML
INTRAVENOUS | Status: AC
  Filled 2016-12-02: qty 5

## 2016-12-02 MED ORDER — SODIUM CHLORIDE 0.9% FLUSH
10.0000 mL | INTRAVENOUS | Status: DC | PRN
  Administered 2016-12-02: 10 mL
  Filled 2016-12-02: qty 10

## 2016-12-02 MED ORDER — ACETAMINOPHEN 325 MG PO TABS
ORAL_TABLET | ORAL | Status: AC
  Filled 2016-12-02: qty 2

## 2016-12-02 MED ORDER — DIPHENHYDRAMINE HCL 25 MG PO CAPS
50.0000 mg | ORAL_CAPSULE | Freq: Once | ORAL | Status: AC
  Administered 2016-12-02: 50 mg via ORAL

## 2016-12-02 MED ORDER — ACETAMINOPHEN 325 MG PO TABS
650.0000 mg | ORAL_TABLET | Freq: Once | ORAL | Status: AC
  Administered 2016-12-02: 650 mg via ORAL

## 2016-12-02 MED ORDER — DEXAMETHASONE SODIUM PHOSPHATE 10 MG/ML IJ SOLN
INTRAMUSCULAR | Status: AC
  Filled 2016-12-02: qty 1

## 2016-12-02 MED ORDER — HEPARIN SOD (PORK) LOCK FLUSH 100 UNIT/ML IV SOLN
500.0000 [IU] | Freq: Once | INTRAVENOUS | Status: AC | PRN
  Administered 2016-12-02: 500 [IU]
  Filled 2016-12-02: qty 5

## 2016-12-02 MED ORDER — SODIUM CHLORIDE 0.9 % IV SOLN
500.0000 mg/m2 | Freq: Once | INTRAVENOUS | Status: AC
  Administered 2016-12-02: 720 mg via INTRAVENOUS
  Filled 2016-12-02: qty 36

## 2016-12-02 MED ORDER — DEXAMETHASONE SODIUM PHOSPHATE 10 MG/ML IJ SOLN
10.0000 mg | Freq: Once | INTRAMUSCULAR | Status: AC
  Administered 2016-12-02: 10 mg via INTRAVENOUS

## 2016-12-02 MED ORDER — DIPHENHYDRAMINE HCL 25 MG PO CAPS
ORAL_CAPSULE | ORAL | Status: AC
  Filled 2016-12-02: qty 2

## 2016-12-02 MED ORDER — SODIUM CHLORIDE 0.9 % IV SOLN
Freq: Once | INTRAVENOUS | Status: AC
  Administered 2016-12-02: 11:00:00 via INTRAVENOUS

## 2016-12-02 MED ORDER — PALONOSETRON HCL INJECTION 0.25 MG/5ML
0.2500 mg | Freq: Once | INTRAVENOUS | Status: AC
  Administered 2016-12-02: 0.25 mg via INTRAVENOUS

## 2016-12-02 MED ORDER — SODIUM CHLORIDE 0.9 % IV SOLN
375.0000 mg/m2 | Freq: Once | INTRAVENOUS | Status: AC
  Administered 2016-12-02: 500 mg via INTRAVENOUS
  Filled 2016-12-02: qty 50

## 2016-12-02 NOTE — Patient Instructions (Addendum)
[facility] Discharge Instructions for Patients Receiving Chemotherapy  Today you received the following chemotherapy agents Doxirubicin, Vincristine, Cytoxan, Rituxan  To help prevent nausea and vomiting after your treatment, we encourage you to take your nausea medication    If you develop nausea and vomiting that is not controlled by your nausea medication, call the clinic.   BELOW ARE SYMPTOMS THAT SHOULD BE REPORTED IMMEDIATELY:  *FEVER GREATER THAN 100.5 F  *CHILLS WITH OR WITHOUT FEVER  NAUSEA AND VOMITING THAT IS NOT CONTROLLED WITH YOUR NAUSEA MEDICATION  *UNUSUAL SHORTNESS OF BREATH  *UNUSUAL BRUISING OR BLEEDING  TENDERNESS IN MOUTH AND THROAT WITH OR WITHOUT PRESENCE OF ULCERS  *URINARY PROBLEMS  *BOWEL PROBLEMS  UNUSUAL RASH Items with * indicate a potential emergency and should be followed up as soon as possible.  Feel free to call the clinic you have any questions or concerns. The clinic phone number is [phone].  Please show the CHEMO ALERT CARD at check-in to the Emergency Department and triage nurse.   

## 2016-12-02 NOTE — Progress Notes (Signed)
[name] Kitchen    HEMATOLOGY/ONCOLOGY CLINIC NOTE  Date of Service: .12/02/2016  Patient Care Team: Smothers, Andree Elk, NP as PCP - General (Nurse Practitioner)  CHIEF COMPLAINTS/PURPOSE OF CONSULTATION:  Post hospitalization f/u for DLBCL Renal cell carcinoma ?  HISTORY OF PRESENTING ILLNESS:  Christina Peterson is a wonderful 76 y.o. female who has been referred to [place] by Dr Cathlean Sauer, Jimmy Picket,*   for evaluation and management of newly diagnosed Large B cell lymphoma.  Patient has a history of hypertension, dyslipidemia, arthritis but notes that she lives independently in a senior housing facility and does all her ADLs herself.  She notes that she has been having increasing abdominal pain and mid back pain for about 3-4 weeks and has had some increased fatigue over the last couple of months. She notes that she has lost about 7-8 pounds over the last 2-3 weeks. She was recently admitted to the hospital from 08/10/2016-08/15/2016 with abdominal pain and poor oral intake. She was noted to be dehydrated with hyponatremia and CT scan of the abdomen on 08/12/2016 showed lymphadenopathy within the chest and abdomen concerning for lymphoma. She also was noted to have a right renal mass in the lower pole of the right kidney measuring 3.6 cm concerning for renal cell carcinoma.  Patient had a CT-guided biopsy of her retroperitoneal lymph node on 08/14/2016 which shows large B-cell lymphoma. She was to be seen in the oncology clinic but got readmitted on 08/21/2016 with worsening abdominal pain nausea and decreased appetite.  She was noted to be significantly hyponatremic again and is currently on IV fluids. She notes her abdominal pain is better controlled with pain medications she is receiving. No fevers or chills. I was consulted to help with further evaluation and management of her newly diagnosed large B-cell lymphoma.  Patient notes abdominal and back pain and about an 8-10 pound weight loss in the last  few weeks and inability to keep much food down due to pain and anorexia. She has been living by herself and demonstrated failure to thrive.  We discussed the diagnosis, natural history, prognosis and treatment options in detail including additional workup including a PET scan, need for a Port-A-Cath and different chemotherapy options possible adverse effects and limitations 2 doses of chemotherapy that can be used as a result of her age. Patient understands and is able to repeat back the understanding of these elements of care and would like to proceed with the most appropriate treatment.  She received her first cycle of R-mini CHOP on 08/26/2016 and had outpatient Neulasta shot since then.  INTERVAL HISTORY  Patient is here for her followup prior to her  5th cycle of R-mini CHOP chemotherapy. She notes increased fatigue with increased dose of doxorubicin. Some decrease in po intake.  No fevers no chills no night sweats. No overt new abdominal pain. Magnesium still borderline at 1.4- we shall replaced today No diarrhea.  Patient is agreeable to proceed with currently chemotherapy today.   MEDICAL HISTORY:  Past Medical History:  Diagnosis Date  . Arthritis   . Diffuse large B-cell lymphoma of lymph nodes of multiple regions ([place]) 08/22/2016  . Hypercholesteremia   . Hypertension    FOLLOWED BY DR Katherine Roan  . Mitral valve prolapse -  severe MR 09/20/2016   Posterior Leaflet  . Severe mitral regurgitation: Per 2 D echo 08/23/2016 08/23/2016   .posterior leaflet prolapse of mitral valve with anteriorly directed mitral regurgitation. Severe. Severely dilated left atrium.  . Severe protein-calorie  malnutrition ([place])     SURGICAL HISTORY: Past Surgical History:  Procedure Laterality Date  . ABDOMINAL HYSTERECTOMY    . IR FLUORO GUIDE PORT INSERTION RIGHT  08/23/2016  . IR US GUIDE VASC ACCESS RIGHT  08/23/2016  . JOINT REPLACEMENT    . 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 TOTAL  HIP REVISION  05/02/2011   Procedure: TOTAL HIP REVISION;  Surgeon: Sharmon Revere, MD;  Location: [facility];  Service: Orthopedics;  Laterality: Left;  left total hip revision with bone grafting  . TRANSTHORACIC ECHOCARDIOGRAM  08/23/2013    EF 60-65%. GR 2 DD.posterior leaflet prolapse of mitral valve with anteriorly directed mitral regurgitation. Severe. Severely dilated left atrium.    SOCIAL HISTORY: Social History   Social History  . Marital status: Legally Separated    Spouse name: N/A  . Number of children: 4  . Years of education: 12   Occupational History  . Not on file.   Social History Main Topics  . Smoking status: Never Smoker  . Smokeless tobacco: Never Used  . Alcohol use No  . Drug use: No  . Sexual activity: Not on file   Other Topics Concern  . Not on file   Social History Narrative   She lives alone in an apartment.  She relies on family members for transportation & helping her figure out her medications.   She has listed married, but reportedly she is legally separated.   They have 4 children and 5 grandchildren and 5 great-grandchildren.   Has Social Security & DSS assistance with food stamps & food banks.      FAMILY HISTORY: Family History  Problem Relation Age of Onset  . Heart disease Mother   . Hypertension Mother   . Heart attack Mother 21  . Hypertension Father   . Brain cancer Father   . Brain cancer Sister   . Hypertension Sister   . Stroke Sister   . Alcohol abuse Brother   . Depression Daughter   . Hypertension Daughter   . Thyroid disease Daughter   . Migraines Daughter     ALLERGIES:  has No Known Allergies.  MEDICATIONS:  Current Outpatient Prescriptions  Medication Sig Dispense Refill  . docusate sodium (COLACE) 100 MG capsule Take 1 capsule (100 mg total) by mouth every 12 (twelve) hours. 60 capsule 1  . dronabinol (MARINOL) 2.5 MG capsule Take 1 capsule (2.5 mg total) by mouth 2 (two) times daily before a meal. 60 capsule 0   . feeding supplement, ENSURE ENLIVE, (ENSURE ENLIVE) LIQD Take 237 mLs by mouth 2 (two) times daily between meals. (Patient taking differently: Take 237 mLs by mouth 2 (two) times daily as needed (nutritional supplement). ) 4500 mL 3  . hydrOXYzine (ATARAX/VISTARIL) 25 MG tablet Take 25 mg by mouth 3 (three) times daily as needed.    . lidocaine-prilocaine (EMLA) cream Apply to port 1-2 hours before needle access. Cover with plastic wrap. 30 g 1  . magnesium oxide (MAG-OX) 400 (241.3 Mg) MG tablet Take 1 tablet (400 mg total) by mouth 2 (two) times daily. 60 tablet 0  . ondansetron (ZOFRAN) 8 MG tablet Take 1 tablet (8 mg total) by mouth every 8 (eight) hours as needed for nausea or vomiting. 30 tablet 0  . oxyCODONE (OXY IR/ROXICODONE) 5 MG immediate release tablet Take 1-2 tablets (5-10 mg total) by mouth every 4 (four) hours as needed for severe pain or breakthrough pain. 60 tablet  0  . pantoprazole (PROTONIX) 20 MG tablet Take 1 tablet (20 mg total) by mouth daily. 30 tablet 0  . polyethylene glycol (MIRALAX / GLYCOLAX) packet Take 17 g by mouth daily. 28 each 1  . potassium chloride SA (K-DUR,KLOR-CON) 20 MEQ tablet Take 1 tablet (20 mEq total) by mouth 2 (two) times daily. 10 tablet 0  . predniSONE (DELTASONE) 20 MG tablet Take 3 tablets (60 mg total) by mouth See admin instructions. 60 mg po daily Day 1 to Day 5 of each chemotherapy cycle 30 tablet 3  . prochlorperazine (COMPAZINE) 10 MG tablet Take 1 tablet (10 mg total) by mouth every 6 (six) hours as needed for nausea or vomiting. 30 tablet 0  . senna-docusate (SENNA S) 8.6-50 MG tablet Take 2 tablets by mouth 2 (two) times daily. Reduce to 2tab po HS if diarrhea occurs 60 tablet 2  . sucralfate (CARAFATE) 1 g tablet Take 1 tablet (1 g total) by mouth 4 (four) times daily. 30 tablet 0   No current facility-administered medications for this visit.    Facility-Administered Medications Ordered in Other Visits  Medication Dose Route  Frequency Provider Last Rate Last Dose  . ondansetron (ZOFRAN-ODT) disintegrating tablet 8 mg  8 mg Oral Once Brunetta Genera, MD        REVIEW OF SYSTEMS:    10 Point review of Systems was done is negative except as noted above.  PHYSICAL EXAMINATION: ECOG PERFORMANCE STATUS: 2 - Symptomatic, <50% confined to bed  . Vitals:   12/02/16 0936  BP: 112/62  Pulse: 73  Resp: 17  Temp: 98.1 F (36.7 C)  SpO2: 100%   Filed Weights   12/02/16 0936  Weight: 94 lb 3.2 oz (42.7 kg)   .Body mass index is 19.03 kg/m.  GENERAL:alert, in no acute distress and comfortable SKIN: no acute rashes, no significant lesions EYES: conjunctiva are pink and non-injected, sclera anicteric OROPHARYNX: MMM, no exudates, no oropharyngeal erythema or ulceration NECK: supple, no JVD LYMPH:  b/l palpable supraclavicular LN's, no palpable cervical LNadenopathy. LUNGS: clear to auscultation b/l with normal respiratory effort HEART: regular rate & rhythm, 3 x 6 systolic murmur over the sternum and aortic area  ABDOMEN:  normoactive bowel sounds , non tender, not distended. Extremity: no pedal edema PSYCH: alert & oriented x 3 with fluent speech NEURO: no focal motor/sensory deficits   LABORATORY DATA:  I have reviewed the data as listed. CBC Latest Ref Rng & Units 12/02/2016 11/11/2016 10/21/2016  WBC 3.9 - 10.3 10e3/uL 8.9 8.4 7.6  Hemoglobin 11.6 - 15.9 g/dL 9.2(L) 10.2(L) 9.5(L)  Hematocrit 34.8 - 46.6 % 29.7(L) 32.8(L) 30.6(L)  Platelets 145 - 400 10e3/uL 437(H) 208 256    . CMP Latest Ref Rng & Units 11/11/2016 10/21/2016 10/07/2016  Glucose 70 - 140 mg/dl 99 79 100  BUN 7.0 - 26.0 mg/dL 7.0 9.3 7.1  Creatinine 0.6 - 1.1 mg/dL 0.7 0.7 0.8  Sodium 136 - 145 mEq/L 138 139 142  Potassium 3.5 - 5.1 mEq/L 4.3 4.3 4.6  Chloride 101 - 111 mmol/L - - -  CO2 22 - 29 mEq/L 28 27 29   Calcium 8.4 - 10.4 mg/dL 10.2 10.2 9.7  Total Protein 6.4 - 8.3 g/dL 6.4 6.2(L) 5.6(L)  Total Bilirubin 0.20 - 1.20  mg/dL 0.64 0.58 0.55  Alkaline Phos 40 - 150 U/L 86 76 106  AST 5 - 34 U/L 15 14 14   ALT 0 - 55 U/L 7 9 9      -------------------------------------------------------------------  LV EF: 60% - 65%  ------------------------------------------------------------------- Indications: (Chemo I74.9).  ------------------------------------------------------------------- History: PMH: No prior cardiac history. Risk factors: Dyslipidemia.  ------------------------------------------------------------------- Study Conclusions  - Left ventricle: The cavity size was normal. Wall thickness was normal. Systolic function was normal. The estimated ejection fraction was in the range of 60% to 65%. Features are consistent with a pseudonormal left ventricular filling pattern, with concomitant abnormal relaxation and increased filling pressure (grade 2 diastolic dysfunction). Doppler parameters are consistent with high ventricular filling pressure. - Aortic valve: There was trivial regurgitation. - Mitral valve: Prolapse of the posterior milral leaflet MR is anteriorly directed and wraps aound wall of LA MR is severe. - Left atrium: The atrium was severely dilated. - Right atrium: The atrium was mildly to moderately dilated. - Pulmonary arteries: PA peak pressure: 49 mm Hg (S).  RADIOGRAPHIC STUDIES: I have personally reviewed the radiological images as listed and agreed with the findings in the report. .Nm Pet Image Restag (ps) Skull Base To Thigh  Result Date: 11/04/2016 CLINICAL DATA:  Initial treatment strategy for B-cell lymphoma, status post 3 cycles of chemotherapy. EXAM: NUCLEAR MEDICINE PET SKULL BASE TO THIGH TECHNIQUE: 5.1 mCi F-18 FDG was injected intravenously. Full-ring PET imaging was performed from the skull base to thigh after the radiotracer. CT data was obtained and used for attenuation correction and anatomic localization. FASTING BLOOD GLUCOSE:  Value:  81 mg/dl COMPARISON:  Chest CT 08/12/2016. Most recent abdominopelvic CT of 09/06/2016. No prior PET. FINDINGS: NECK Hypermetabolic low left jugular node measures 8 mm and a S.U.V. max of 37.8 on image 37/series 4. This node measured 7 mm back on 08/12/2016. No cervical adenopathy. CHEST No pulmonary parenchymal or thoracic nodal hypermetabolism identified. Right Port-A-Cath terminates at the high right atrium. Moderate cardiomegaly. Anterior mediastinal adenopathy has resolved. Bibasilar pulmonary opacities have cleared since 09/06/2016. ABDOMEN/PELVIS Hypermetabolic adenopathy within the porta hepatis region is decreased in size. Measures on the order of 1.8 cm by 2.5 and a S.U.V. max of 17.5 on image 98/series 4. Compare 5.6 cm on 08/12/2016 and 4.2 cm on 09/06/2016. Left periaortic and adjacent small bowel mesenteric adenopathy corresponds to hypermetabolism. Index left periaortic node measures 2.0 x 2.3 cm and a S.U.V. max of 59.1 on image 118/series 4. Compare 2.3 x 2.1 cm back on 08/12/2016. The largest small bowel mesenteric mass measures 2.0 cm on image 120/series 4 versus 2.5 cm on 08/12/2016 (when remeasured). The suspicious right renal mass on prior CT is only mildly hypermetabolic, measuring a S.U.V. max of 2.9. Abdominal aortic atherosclerosis. SKELETON Diffuse mild marrow hypermetabolism. Left hip arthroplasty. Right hip osteoarthritis. No suspicious focal osseous abnormality. IMPRESSION: 1. Mild response to therapy since prior diagnostic CTs. Persistent hypermetabolic disease within the neck and abdomen. ([place] 5.) 2. Resolution of bibasilar airspace disease/pneumonia since 09/06/2016. 3.  Aortic Atherosclerosis (ICD10-I70.0). 4. Right sided renal mass is again suspicious for renal cell carcinoma but suboptimally evaluated. 5. Marrow hypermetabolism is favored to be related to stimulation by chemotherapy. Recommend attention on follow-up. Electronically Signed   By: Abigail Miyamoto M.D.   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 Patient has extensive retroperitoneal mesenteric and periportal lymphadenopathy along with adenopathy in the chest and supraclavicular areas and a focal lesion in the spleen.   Patient received first cycle of R-mini CHOP on 08/26/2016 and received outpatient neulasta. Patient received 2nd  cycle of R-mini CHOP on 09/26/2016 and received outpatient neulasta. Received third cycle of R-mini CHOP on 09/26/2016 and received outpatient neulasta. Received third cycle of R-mini CHOP on 10/21/2016 and received outpatient neulasta. PET scan on 11/04/2016 which shows treatment response but still with fairly active disease. C4 -anthracycline dose (doxorubicin 50 mg/m) to standard CHOP dosing and continue with dose reduced cyclophosphamide. -Continue Neulasta support   Plan -labs stable. No overt new symptoms suggestive of disease progession -proceed with C5 of R-CHOP with some dose as cycle 4.  #2 Hypomagnesemia Mg 1.5 today Plan -magnesium replacement ordered  #3 Right lower pole of the kidney lesion ? Renal cell carcinoma versus involvement by lymphoma . Plan - no significant change in size . 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  #3 significant fatigue and failure to thrive due to poor oral intake related lymphoma and type B constitutional symptoms . Plan -Continue to follow with barara Neff/dietitian to address her nutritional requirements. -Continue Marinol 2.5 mg by mouth twice a day since this appears to have helped her appetite . - given referral to cancer rehab  #4 neoplasm related pain currently well-controlled  Plan  -Oxycodone 5-10 mg every 4 hours as needed for breakthrough pain  #5 severe mitral regurgitation normal ejection fraction -f/u with cardiology  plz schedule 6th cycles of R-CHOP Return to clinic with Dr. Irene Limbo in 3  weeks with sixth cycle of R CHOP with labs Referral to cancer rehabilitation   All of the patients questions were answered with apparent satisfaction. The patient knows to call the clinic with any problems, questions or concerns.  I spent 20 minutes counseling the patient face to face. The total time spent in the appointment was 25 minutes and more than 50% was on counseling and direct patient cares.    Sullivan Lone MD [place] AAHIVMS [facility] Hematology/Oncology Physician [facility]  (Office):       [phone] (Work cell):  [phone] (Fax):           [phone]

## 2016-12-02 NOTE — Progress Notes (Signed)
Nutrition follow-up completed with patient during infusion for B-cell lymphoma. Weight decreased and documented as 94.2 pounds on August 20, down from 95.3 pounds July 30. Patient reports Marinol has not really helped her to eat more. States appetite and intake are variable. She is not taking a stool softener.  She denies constipation and diarrhea. Patient refuses oral nutrition supplements.  Nutrition diagnosis: Severe malnutrition continues.  Intervention: I educated patient to increase calories and protein at mealtimes and snacks. Recommended patient drink whole milk with meals. Encouraged 3 meals and 3 snacks daily. Teach back method used.  Monitoring, evaluation, goals:  Patient will work to increase calories and protein to minimize weight loss.  Next visit: Monday, September 10, during infusion.  **Disclaimer: This note was dictated with voice recognition software. Similar sounding words can inadvertently be transcribed and this note may contain transcription errors which may not have been corrected upon publication of note.**

## 2016-12-03 ENCOUNTER — Ambulatory Visit (INDEPENDENT_AMBULATORY_CARE_PROVIDER_SITE_OTHER): Payer: Medicare Other

## 2016-12-03 ENCOUNTER — Ambulatory Visit (INDEPENDENT_AMBULATORY_CARE_PROVIDER_SITE_OTHER): Payer: Medicare Other | Admitting: Orthopaedic Surgery

## 2016-12-03 ENCOUNTER — Encounter (INDEPENDENT_AMBULATORY_CARE_PROVIDER_SITE_OTHER): Payer: Self-pay | Admitting: Orthopaedic Surgery

## 2016-12-03 VITALS — BP 117/70 | HR 90 | Ht 59.0 in | Wt 94.0 lb

## 2016-12-03 DIAGNOSIS — M25552 Pain in left hip: Secondary | ICD-10-CM

## 2016-12-03 NOTE — Progress Notes (Signed)
Office Visit Note   Patient: Christina Peterson           Date of Birth: 10-12-1940           MRN: [id number] Visit Date: 12/03/2016              Requested by: No referring provider defined for this encounter. PCP: Smothers, Andree Elk, NP   Assessment & Plan: Visit Diagnoses:  1. Pain in left hip     Plan: X-rays demonstrate satisfactory position of her hip revision without evidence of loosening. She can return if she has increased problems.  Follow-Up Instructions: No Follow-up on file.   Orders:  Orders Placed This Encounter  Procedures  . XR HIP UNILAT W OR W/O PELVIS 2-3 VIEWS LEFT   No orders of the defined types were placed in this encounter.     Procedures: No procedures performed   Clinical Data: No additional findings.   Subjective: Chief Complaint  Patient presents with  . Left Hip - Pain, Follow-up    HPI 76 year old female with B-cell lymphoma who is undergoing chemotherapy that extends until September. She's had some increased pain and weakness in her left leg she's been a mature with a cane. When was the pain the worst about 3 weeks ago. She states since that time it's improved to some degree. Past history of the lip arthroplasty by Dr. Marily Memos and then later hip ReVision 2013.  Review of Systems pedis systems positive for mitral regurg protein calorie malnourishment, hyperlipidemia, B-cell lymphoma on chemotherapy. Positive history of rheumatoid arthritis. Otherwise negative as it pertains history of present illness.   Objective: Vital Signs: BP 117/70   Pulse 90   Ht 4\' 11"  (1.499 m)   Wt 94 lb (42.6 kg)   BMI 18.99 kg/m   Physical Exam  Constitutional: She is oriented to person, place, and time. She appears well-developed.  HENT:  Head: Normocephalic.  Right Ear: External ear normal.  Left Ear: External ear normal.  Eyes: Pupils are equal, round, and reactive to light.  Neck: No tracheal deviation present. No thyromegaly present.    Cardiovascular: Normal rate.   Pulmonary/Chest: Effort normal.  Abdominal: Soft.  Musculoskeletal:  Patient is well-healed hip incision leg lengths are equal she is a mature with slight limp she annulus with her cane. Ankle jerk 1+. She has intact posterior tibial pulses. Anterior tib EHL gastrocsoleus function is normal she has trace pitting edema.  Neurological: She is alert and oriented to person, place, and time.  Skin: Skin is warm and dry.  Psychiatric: She has a normal mood and affect. Her behavior is normal.    Ortho Exam Patient has some bilateral first dorsal interosseous atrophy. Specialty Comments:  No specialty comments available.  Imaging: No results found.   PMFS History: Patient Active Problem List   Diagnosis Date Noted  . Mitral valve prolapse -  severe MR 09/20/2016  . Aspiration pneumonia due to inhalation of vomitus ([place]) 09/12/2016  . Hypokalemia 09/12/2016  . Counseling regarding advanced care planning and goals of care   . Sepsis ([place]) 09/07/2016  . Nausea and vomiting 09/07/2016  . Ileus ([place]) 09/07/2016  . Severe mitral regurgitation: Per 2 D echo 08/23/2016 08/23/2016  . Diffuse large B-cell lymphoma of lymph nodes of multiple regions ([place]) 08/22/2016  . B-cell lymphoma ([place])   . Renal cell adenocarcinoma ([place])   . History of peristent atrial fibrillation 08/20/2016  . Abdominal aortic atherosclerosis ([place]) - noted on  CT scan 08/20/2016  . Severe protein-calorie malnutrition ([place])   . Hyponatremia 08/11/2016  . Normocytic normochromic anemia 08/11/2016  . Essential hypertension 08/11/2016  . Constipation 08/11/2016  . Hyperlipidemia 08/11/2016  . Abdominal pain 08/11/2016  . Renal mass, right 08/11/2016  . Adenopathy 08/11/2016  . Other intra-abdominal and pelvic swelling, mass and lump 08/11/2016  . Pain in left hip 06/05/2016   Past Medical History:  Diagnosis Date  . Arthritis   . Diffuse large B-cell lymphoma of lymph nodes of multiple  regions ([place]) 08/22/2016  . Hypercholesteremia   . Hypertension    FOLLOWED BY DR Katherine Roan  . Mitral valve prolapse -  severe MR 09/20/2016   Posterior Leaflet  . Severe mitral regurgitation: Per 2 D echo 08/23/2016 08/23/2016   .posterior leaflet prolapse of mitral valve with anteriorly directed mitral regurgitation. Severe. Severely dilated left atrium.  . Severe protein-calorie malnutrition ([place])     Family History  Problem Relation Age of Onset  . Heart disease Mother   . Hypertension Mother   . Heart attack Mother 41  . Hypertension Father   . Brain cancer Father   . Brain cancer Sister   . Hypertension Sister   . Stroke Sister   . Alcohol abuse Brother   . Depression Daughter   . Hypertension Daughter   . Thyroid disease Daughter   . Migraines Daughter     Past Surgical History:  Procedure Laterality Date  . ABDOMINAL HYSTERECTOMY    . IR FLUORO GUIDE PORT INSERTION RIGHT  08/23/2016  . IR US GUIDE VASC ACCESS RIGHT  08/23/2016  . JOINT REPLACEMENT    . 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 TOTAL HIP REVISION  05/02/2011   Procedure: TOTAL HIP REVISION;  Surgeon: Sharmon Revere, MD;  Location: [facility];  Service: Orthopedics;  Laterality: Left;  left total hip revision with bone grafting  . TRANSTHORACIC ECHOCARDIOGRAM  08/23/2013    EF 60-65%. GR 2 DD.posterior leaflet prolapse of mitral valve with anteriorly directed mitral regurgitation. Severe. Severely dilated left atrium.   Social History   Occupational History  . Not on file.   Social History Main Topics  . Smoking status: Never Smoker  . Smokeless tobacco: Never Used  . Alcohol use No  . Drug use: No  . Sexual activity: Not on file

## 2016-12-04 ENCOUNTER — Ambulatory Visit (HOSPITAL_BASED_OUTPATIENT_CLINIC_OR_DEPARTMENT_OTHER): Payer: Medicare Other

## 2016-12-04 VITALS — BP 140/73 | HR 72 | Temp 98.2°F | Resp 18

## 2016-12-04 DIAGNOSIS — Z5189 Encounter for other specified aftercare: Secondary | ICD-10-CM

## 2016-12-04 DIAGNOSIS — C8338 Diffuse large B-cell lymphoma, lymph nodes of multiple sites: Secondary | ICD-10-CM

## 2016-12-04 MED ORDER — PEGFILGRASTIM INJECTION 6 MG/0.6ML ~~LOC~~
6.0000 mg | PREFILLED_SYRINGE | Freq: Once | SUBCUTANEOUS | Status: AC
  Administered 2016-12-04: 6 mg via SUBCUTANEOUS
  Filled 2016-12-04: qty 0.6

## 2016-12-04 NOTE — Patient Instructions (Signed)
Pegfilgrastim injection What is this medicine? PEGFILGRASTIM (PEG fil gra stim) is a long-acting granulocyte colony-stimulating factor that stimulates the growth of neutrophils, a type of white blood cell important in the body's fight against infection. It is used to reduce the incidence of fever and infection in patients with certain types of cancer who are receiving chemotherapy that affects the bone marrow, and to increase survival after being exposed to high doses of radiation. This medicine may be used for other purposes; ask your health care provider or pharmacist if you have questions. COMMON BRAND NAME(S): Neulasta What should I tell my health care provider before I take this medicine? They need to know if you have any of these conditions: -kidney disease -latex allergy -ongoing radiation therapy -sickle cell disease -skin reactions to acrylic adhesives (On-Body Injector only) -an unusual or allergic reaction to pegfilgrastim, filgrastim, other medicines, foods, dyes, or preservatives -pregnant or trying to get pregnant -breast-feeding How should I use this medicine? This medicine is for injection under the skin. If you get this medicine at home, you will be taught how to prepare and give the pre-filled syringe or how to use the On-body Injector. Refer to the patient Instructions for Use for detailed instructions. Use exactly as directed. Tell your healthcare provider immediately if you suspect that the On-body Injector may not have performed as intended or if you suspect the use of the On-body Injector resulted in a missed or partial dose. It is important that you put your used needles and syringes in a special sharps container. Do not put them in a trash can. If you do not have a sharps container, call your pharmacist or healthcare provider to get one. Talk to your pediatrician regarding the use of this medicine in children. While this drug may be prescribed for selected conditions,  precautions do apply. Overdosage: If you think you have taken too much of this medicine contact a poison control center or emergency room at once. NOTE: This medicine is only for you. Do not share this medicine with others. What if I miss a dose? It is important not to miss your dose. Call your doctor or health care professional if you miss your dose. If you miss a dose due to an On-body Injector failure or leakage, a new dose should be administered as soon as possible using a single prefilled syringe for manual use. What may interact with this medicine? Interactions have not been studied. Give your health care provider a list of all the medicines, herbs, non-prescription drugs, or dietary supplements you use. Also tell them if you smoke, drink alcohol, or use illegal drugs. Some items may interact with your medicine. This list may not describe all possible interactions. Give your health care provider a list of all the medicines, herbs, non-prescription drugs, or dietary supplements you use. Also tell them if you smoke, drink alcohol, or use illegal drugs. Some items may interact with your medicine. What should I watch for while using this medicine? You may need blood work done while you are taking this medicine. If you are going to need a MRI, CT scan, or other procedure, tell your doctor that you are using this medicine (On-Body Injector only). What side effects may I notice from receiving this medicine? Side effects that you should report to your doctor or health care professional as soon as possible: -allergic reactions like skin rash, itching or hives, swelling of the face, lips, or tongue -dizziness -fever -pain, redness, or irritation at site   where injected -pinpoint red spots on the skin -red or dark-brown urine -shortness of breath or breathing problems -stomach or side pain, or pain at the shoulder -swelling -tiredness -trouble passing urine or change in the amount of urine Side  effects that usually do not require medical attention (report to your doctor or health care professional if they continue or are bothersome): -bone pain -muscle pain This list may not describe all possible side effects. Call your doctor for medical advice about side effects. You may report side effects to FDA at 1-800-FDA-1088. Where should I keep my medicine? Keep out of the reach of children. Store pre-filled syringes in a refrigerator between 2 and 8 degrees C (36 and 46 degrees F). Do not freeze. Keep in carton to protect from light. Throw away this medicine if it is left out of the refrigerator for more than 48 hours. Throw away any unused medicine after the expiration date. NOTE: This sheet is a summary. It may not cover all possible information. If you have questions about this medicine, talk to your doctor, pharmacist, or health care provider.  2018 [employer] (2016-03-28 12:58:03)  

## 2016-12-18 ENCOUNTER — Telehealth: Payer: Self-pay

## 2016-12-18 ENCOUNTER — Telehealth: Payer: Self-pay | Admitting: *Deleted

## 2016-12-18 ENCOUNTER — Ambulatory Visit: Payer: Medicare Other | Admitting: Physical Therapy

## 2016-12-18 NOTE — Telephone Encounter (Signed)
Pt c/o cough, unable to cough it out. She has used day time cough medicine. Had for last 5 days. No fever, no body aches, no nausea/vomiting, no sore throat, no diarrhea. Instructed her to call PCP, drink plenty of water, try to eat well, continue cough medicine, salt water gargles if throat gets sore.   Her only other c/o is walking around slow. She is thinking of cancelling her cancer rehab today.

## 2016-12-18 NOTE — Telephone Encounter (Signed)
Pt states she is going to PCP tomorrow for cough

## 2016-12-19 ENCOUNTER — Other Ambulatory Visit: Payer: Self-pay | Admitting: Family

## 2016-12-19 ENCOUNTER — Ambulatory Visit
Admission: RE | Admit: 2016-12-19 | Discharge: 2016-12-19 | Disposition: A | Discharge status: Home / Self Care | Payer: Medicare Other | Source: Ambulatory Visit | Attending: Family | Admitting: Family

## 2016-12-19 DIAGNOSIS — R05 Cough: Secondary | ICD-10-CM

## 2016-12-19 DIAGNOSIS — R059 Cough, unspecified: Secondary | ICD-10-CM

## 2016-12-22 NOTE — Progress Notes (Signed)
[name] Kitchen    HEMATOLOGY/ONCOLOGY CLINIC NOTE  Date of Service: 12/23/16  Patient Care Team: Smothers, Andree Elk, NP as PCP - General (Nurse Practitioner)  CHIEF COMPLAINTS/PURPOSE OF CONSULTATION:  Post hospitalization f/u for DLBCL Renal cell carcinoma ?  HISTORY OF PRESENTING ILLNESS:  Christina Peterson is a wonderful 76 y.o. female who has been referred to [place] by Dr Cathlean Sauer, Jimmy Picket,*   for evaluation and management of newly diagnosed Large B cell lymphoma.  Patient has a history of hypertension, dyslipidemia, arthritis but notes that she lives independently in a senior housing facility and does all her ADLs herself.  She notes that she has been having increasing abdominal pain and mid back pain for about 3-4 weeks and has had some increased fatigue over the last couple of months. She notes that she has lost about 7-8 pounds over the last 2-3 weeks. She was recently admitted to the hospital from 08/10/2016-08/15/2016 with abdominal pain and poor oral intake. She was noted to be dehydrated with hyponatremia and CT scan of the abdomen on 08/12/2016 showed lymphadenopathy within the chest and abdomen concerning for lymphoma. She also was noted to have a right renal mass in the lower pole of the right kidney measuring 3.6 cm concerning for renal cell carcinoma.  Patient had a CT-guided biopsy of her retroperitoneal lymph node on 08/14/2016 which shows large B-cell lymphoma. She was to be seen in the oncology clinic but got readmitted on 08/21/2016 with worsening abdominal pain nausea and decreased appetite.  She was noted to be significantly hyponatremic again and is currently on IV fluids. She notes her abdominal pain is better controlled with pain medications she is receiving. No fevers or chills. I was consulted to help with further evaluation and management of her newly diagnosed large B-cell lymphoma.  Patient notes abdominal and back pain and about an 8-10 pound weight loss in the last few  weeks and inability to keep much food down due to pain and anorexia. She has been living by herself and demonstrated failure to thrive.  We discussed the diagnosis, natural history, prognosis and treatment options in detail including additional workup including a PET scan, need for a Port-A-Cath and different chemotherapy options possible adverse effects and limitations 2 doses of chemotherapy that can be used as a result of her age. Patient understands and is able to repeat back the understanding of these elements of care and would like to proceed with the most appropriate treatment.  She received her first cycle of R-mini CHOP on 08/26/2016 and had outpatient Neulasta shot since then.  INTERVAL HISTORY  Patient is here for her followup prior to her  6th cycle of R-mini CHOP chemotherapy. Pt notes that she has not been doing well overall and has had decreased appetite. She states that she is consuming ensure and she doesn't like the taste of them. Pt reports that she gets 2-3 meals daily. Pt notes that she ambulates at home with a walker. She denies having a home nurse at this time. She states that she lives alone, but has family frequently coming over. Pt reports that she stays in the bed and reports that she has increased fatigue with making meals for herself. She states that the Marinol was helping for awhile, but is no longer helping her. She reports that her family is attempting to aid with eating. Pt states that she has seen the Nutritionist [place] at the clinic in the past. She reports that she has a chronic cough  and is unable to produce sputum. She had a chest x-ray in 12/19/2016 with her primary care physician that showed Significantly improved right basilar opacity is noted consistent with improving inflammation or atelectasis.  On review of systems, pt reports decreased appetite. Denies mouth sores. Pt reports intermittent abdominal pain. Pt denies bowel issues.  Pt reports intermittent cough.  Pt reports chills.     MEDICAL HISTORY:  Past Medical History:  Diagnosis Date  . Arthritis   . Diffuse large B-cell lymphoma of lymph nodes of multiple regions ([place]) 08/22/2016  . Hypercholesteremia   . Hypertension    FOLLOWED BY DR Katherine Roan  . Mitral valve prolapse -  severe MR 09/20/2016   Posterior Leaflet  . Severe mitral regurgitation: Per 2 D echo 08/23/2016 08/23/2016   .posterior leaflet prolapse of mitral valve with anteriorly directed mitral regurgitation. Severe. Severely dilated left atrium.  . Severe protein-calorie malnutrition ([place])     SURGICAL HISTORY: Past Surgical History:  Procedure Laterality Date  . ABDOMINAL HYSTERECTOMY    . IR FLUORO GUIDE PORT INSERTION RIGHT  08/23/2016  . IR US GUIDE VASC ACCESS RIGHT  08/23/2016  . JOINT REPLACEMENT    . 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 TOTAL HIP REVISION  05/02/2011   Procedure: TOTAL HIP REVISION;  Surgeon: Sharmon Revere, MD;  Location: [facility];  Service: Orthopedics;  Laterality: Left;  left total hip revision with bone grafting  . TRANSTHORACIC ECHOCARDIOGRAM  08/23/2013    EF 60-65%. GR 2 DD.posterior leaflet prolapse of mitral valve with anteriorly directed mitral regurgitation. Severe. Severely dilated left atrium.    SOCIAL HISTORY: Social History   Social History  . Marital status: Legally Separated    Spouse name: N/A  . Number of children: 4  . Years of education: 12   Occupational History  . Not on file.   Social History Main Topics  . Smoking status: Never Smoker  . Smokeless tobacco: Never Used  . Alcohol use No  . Drug use: No  . Sexual activity: Not on file   Other Topics Concern  . Not on file   Social History Narrative   She lives alone in an apartment.  She relies on family members for transportation & helping her figure out her medications.   She has listed married, but reportedly she is legally separated.   They have 4 children and 5 grandchildren and 5  great-grandchildren.   Has Social Security & DSS assistance with food stamps & food banks.      FAMILY HISTORY: Family History  Problem Relation Age of Onset  . Heart disease Mother   . Hypertension Mother   . Heart attack Mother 39  . Hypertension Father   . Brain cancer Father   . Brain cancer Sister   . Hypertension Sister   . Stroke Sister   . Alcohol abuse Brother   . Depression Daughter   . Hypertension Daughter   . Thyroid disease Daughter   . Migraines Daughter     ALLERGIES:  has No Known Allergies.  MEDICATIONS:  Current Outpatient Prescriptions  Medication Sig Dispense Refill  . azithromycin (ZITHROMAX) 250 MG tablet 2 tab (500mg  )x 1 then 1 tab (250mg ) po daily x 4 days 6 each 0  . docusate sodium (COLACE) 100 MG capsule Take 1 capsule (100 mg total) by mouth every 12 (twelve) hours. 60 capsule 1  . dronabinol (MARINOL) 2.5 MG capsule Take 1 capsule (2.5 mg  total) by mouth 2 (two) times daily before a meal. 60 capsule 0  . feeding supplement, ENSURE ENLIVE, (ENSURE ENLIVE) LIQD Take 237 mLs by mouth 2 (two) times daily between meals. (Patient taking differently: Take 237 mLs by mouth 2 (two) times daily as needed (nutritional supplement). ) 4500 mL 3  . hydrOXYzine (ATARAX/VISTARIL) 25 MG tablet Take 25 mg by mouth 3 (three) times daily as needed.    . lidocaine-prilocaine (EMLA) cream Apply to port 1-2 hours before needle access. Cover with plastic wrap. 30 g 1  . magnesium oxide (MAG-OX) 400 (241.3 Mg) MG tablet Take 1 tablet (400 mg total) by mouth 2 (two) times daily. 60 tablet 0  . ondansetron (ZOFRAN) 8 MG tablet Take 1 tablet (8 mg total) by mouth every 8 (eight) hours as needed for nausea or vomiting. 30 tablet 0  . oxyCODONE (OXY IR/ROXICODONE) 5 MG immediate release tablet Take 1-2 tablets (5-10 mg total) by mouth every 4 (four) hours as needed for severe pain or breakthrough pain. 60 tablet 0  . pantoprazole (PROTONIX) 20 MG tablet Take 1 tablet (20 mg  total) by mouth daily. 30 tablet 0  . polyethylene glycol (MIRALAX / GLYCOLAX) packet Take 17 g by mouth daily. 28 each 1  . potassium chloride SA (K-DUR,KLOR-CON) 20 MEQ tablet Take 1 tablet (20 mEq total) by mouth 2 (two) times daily. 10 tablet 0  . predniSONE (DELTASONE) 20 MG tablet Take 3 tablets (60 mg total) by mouth See admin instructions. 60 mg po daily Day 1 to Day 5 of each chemotherapy cycle 30 tablet 3  . prochlorperazine (COMPAZINE) 10 MG tablet Take 1 tablet (10 mg total) by mouth every 6 (six) hours as needed for nausea or vomiting. 30 tablet 0  . senna-docusate (SENNA S) 8.6-50 MG tablet Take 2 tablets by mouth 2 (two) times daily. Reduce to 2tab po HS if diarrhea occurs 60 tablet 2  . sucralfate (CARAFATE) 1 g tablet Take 1 tablet (1 g total) by mouth 4 (four) times daily. 30 tablet 0   No current facility-administered medications for this visit.    Facility-Administered Medications Ordered in Other Visits  Medication Dose Route Frequency Provider Last Rate Last Dose  . ondansetron (ZOFRAN-ODT) disintegrating tablet 8 mg  8 mg Oral Once Brunetta Genera, MD        REVIEW OF SYSTEMS:    10 Point review of Systems was done is negative except as noted above.  PHYSICAL EXAMINATION:  ECOG PERFORMANCE STATUS: 2 - Symptomatic, <50% confined to bed Vital signs reviewed in Epic . Wt Readings from Last 3 Encounters:  12/03/16 94 lb (42.6 kg)  12/02/16 94 lb 3.2 oz (42.7 kg)  11/11/16 95 lb 4.8 oz (43.2 kg)   GENERAL:alert, in no acute distress and comfortable. Cachetic appearing. SKIN: no acute rashes, no significant lesions EYES: conjunctiva are pink and non-injected, sclera anicteric OROPHARYNX: MMM, no exudates, no oropharyngeal erythema or ulceration NECK: supple, no JVD LYMPH:  no palpable cervical or axillary LNadenopathy. LUNGS: clear to auscultation b/l with normal respiratory effort HEART: regular rate & rhythm, 3/6 systolic murmur over the sternum and aortic  area  ABDOMEN:  normoactive bowel sounds , non tender, not distended. Extremity: no pedal edema PSYCH: alert & oriented x 3 with fluent speech NEURO: no focal motor/sensory deficits   LABORATORY DATA:  I have reviewed the data as listed. CBC Latest Ref Rng & Units 12/23/2016 12/02/2016 11/11/2016  WBC 3.9 - 10.3 10e3/uL 12.4(H) 8.9  8.4  Hemoglobin 11.6 - 15.9 g/dL 9.5(L) 9.2(L) 10.2(L)  Hematocrit 34.8 - 46.6 % 29.7(L) 29.7(L) 32.8(L)  Platelets 145 - 400 10e3/uL 500(H) 437(H) 208    . CMP Latest Ref Rng & Units 12/23/2016 12/02/2016 11/11/2016  Glucose 70 - 140 mg/dl 90 92 99  BUN 7.0 - 26.0 mg/dL 6.4(L) 7.0 7.0  Creatinine 0.6 - 1.1 mg/dL 0.7 0.7 0.7  Sodium 136 - 145 mEq/L 137 138 138  Potassium 3.5 - 5.1 mEq/L 4.3 4.4 4.3  Chloride 101 - 111 mmol/L - - -  CO2 22 - 29 mEq/L 27 29 28   Calcium 8.4 - 10.4 mg/dL 10.1 9.8 10.2  Total Protein 6.4 - 8.3 g/dL 6.2(L) 5.9(L) 6.4  Total Bilirubin 0.20 - 1.20 mg/dL 0.53 0.42 0.64  Alkaline Phos 40 - 150 U/L 82 99 86  AST 5 - 34 U/L 18 17 15   ALT 0 - 55 U/L 7 12 7      ------------------------------------------------------------------- LV EF: 60% - 65%  ------------------------------------------------------------------- Indications: (Chemo I74.9).  ------------------------------------------------------------------- History: PMH: No prior cardiac history. Risk factors: Dyslipidemia.  ------------------------------------------------------------------- Study Conclusions  - Left ventricle: The cavity size was normal. Wall thickness was normal. Systolic function was normal. The estimated ejection fraction was in the range of 60% to 65%. Features are consistent with a pseudonormal left ventricular filling pattern, with concomitant abnormal relaxation and increased filling pressure (grade 2 diastolic dysfunction). Doppler parameters are consistent with high ventricular filling pressure. - Aortic valve: There  was trivial regurgitation. - Mitral valve: Prolapse of the posterior milral leaflet MR is anteriorly directed and wraps aound wall of LA MR is severe. - Left atrium: The atrium was severely dilated. - Right atrium: The atrium was mildly to moderately dilated. - Pulmonary arteries: PA peak pressure: 49 mm Hg (S).  RADIOGRAPHIC STUDIES: I have personally reviewed the radiological images as listed and agreed with the findings in the report. .Dg Chest 2 View  Result Date: 12/19/2016 CLINICAL DATA:  Cough. EXAM: CHEST  2 VIEW COMPARISON:  Radiograph of Sep 07, 2016. FINDINGS: Stable cardiomegaly. No pneumothorax or pleural effusion is noted. Right internal jugular Port-A-Cath is unchanged in position. Left lung is clear. Mild right basilar opacity is noted which is significantly improved compared to prior exam, consistent with improving inflammation or atelectasis. Multilevel degenerative disc disease is noted in the thoracic spine. IMPRESSION: Significantly improved right basilar opacity is noted consistent with improving inflammation or atelectasis. Electronically Signed   By: Marijo Conception, M.D.   On: 12/19/2016 13:49   Xr Hip Unilat W Or W/o Pelvis 2-3 Views Left  Result Date: 12/03/2016 Two-view x-rays obtained of left hip. This shows total hip arthroplasty with several acetabular screws with a high hip center. No evidence of loosening of the femoral component. Impression: Status post hip revision with high hip center. Leg lengths appear equal.   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 Patient has extensive retroperitoneal mesenteric and periportal lymphadenopathy along with adenopathy in the chest and supraclavicular areas and a focal lesion in the spleen.   Patient received first cycle of R-mini CHOP on 08/26/2016 and received outpatient neulasta. Patient received 2nd  cycle of R-mini CHOP on 09/26/2016 and  received outpatient neulasta. Received third cycle of R-mini CHOP on 09/26/2016 and received outpatient neulasta. Received third cycle of R-mini CHOP on 10/21/2016 and received outpatient neulasta. PET scan on 11/04/2016 which  shows treatment response but still with fairly active disease. C4 -anthracycline dose (doxorubicin 50 mg/m) to standard CHOP dosing and continue with dose reduced cyclophosphamide.  C5 of R-CHOP with some dose as cycle 4.  Plan -labs stable. No overt new symptoms suggestive of disease progression. Weight is fairly stable -proceed with cycle 6 of R CHOP with Neulasta support. -will need to get PET/CT scan in 3 weeks to assess treatment response to determine further treatment strategy.  #2 Hypomagnesemia Mg 1.4 today Plan -magnesium replacement ordered - 1 g. Continue by mouth magnesium oxide-  #3 Right lower pole of the kidney lesion ? Renal cell carcinoma versus involvement by lymphoma . Plan -Will be reevaluated in her upcoming PET/CT scan.  #3 significant fatigue and failure to thrive due to poor oral intake related lymphoma and type B constitutional symptoms . Plan -Continue to follow with [name]/dietitian to address her nutritional requirements. --Advised patient to continue nutrition supplement Boost/ensure -Will refill compazine. - I will give the patient a referral for home PT -Will give a referral for home health nurse.    #4 neoplasm related pain currently well-controlled  Plan -Oxycodone 5-10 mg every 4 hours as needed for breakthrough pain  #5 severe mitral regurgitation normal ejection fraction -f/u with cardiology   Plan:  -Will order abx/Azithrmycin for possible bronchitis  RTC with Dr. Irene Limbo in 3 weeks with PET scan and labs    All of the patients questions were answered with apparent satisfaction. The patient knows to call the clinic with any problems, questions or concerns.  I spent 20 minutes counseling the patient face to  face. The total time spent in the appointment was 25 minutes and more than 50% was on counseling and direct patient cares.    Sullivan Lone MD [place] AAHIVMS [facility] Hematology/Oncology Physician [facility]  (Office):       [phone] (Work cell):  [phone] (Fax):           [phone]  This document serves as a record of services personally performed by Sullivan Lone, MD. It was created on her behalf by Steva Colder, a trained medical scribe. The creation of this record is based on the scribe's personal observations and the provider's statements to them. This document has been checked and approved by the attending provider.

## 2016-12-23 ENCOUNTER — Ambulatory Visit (HOSPITAL_BASED_OUTPATIENT_CLINIC_OR_DEPARTMENT_OTHER): Payer: Medicare Other

## 2016-12-23 ENCOUNTER — Ambulatory Visit: Payer: Medicare Other | Admitting: Nutrition

## 2016-12-23 ENCOUNTER — Other Ambulatory Visit (HOSPITAL_BASED_OUTPATIENT_CLINIC_OR_DEPARTMENT_OTHER): Payer: Medicare Other

## 2016-12-23 ENCOUNTER — Ambulatory Visit (HOSPITAL_BASED_OUTPATIENT_CLINIC_OR_DEPARTMENT_OTHER): Payer: Medicare Other | Admitting: Hematology

## 2016-12-23 DIAGNOSIS — Z5111 Encounter for antineoplastic chemotherapy: Secondary | ICD-10-CM

## 2016-12-23 DIAGNOSIS — Z5112 Encounter for antineoplastic immunotherapy: Secondary | ICD-10-CM | POA: Diagnosis not present

## 2016-12-23 DIAGNOSIS — G893 Neoplasm related pain (acute) (chronic): Secondary | ICD-10-CM | POA: Diagnosis not present

## 2016-12-23 DIAGNOSIS — R109 Unspecified abdominal pain: Secondary | ICD-10-CM | POA: Diagnosis not present

## 2016-12-23 DIAGNOSIS — R05 Cough: Secondary | ICD-10-CM | POA: Diagnosis not present

## 2016-12-23 DIAGNOSIS — I340 Nonrheumatic mitral (valve) insufficiency: Secondary | ICD-10-CM | POA: Diagnosis not present

## 2016-12-23 DIAGNOSIS — C8338 Diffuse large B-cell lymphoma, lymph nodes of multiple sites: Secondary | ICD-10-CM

## 2016-12-23 DIAGNOSIS — R5383 Other fatigue: Secondary | ICD-10-CM | POA: Diagnosis not present

## 2016-12-23 DIAGNOSIS — N289 Disorder of kidney and ureter, unspecified: Secondary | ICD-10-CM

## 2016-12-23 DIAGNOSIS — R627 Adult failure to thrive: Secondary | ICD-10-CM | POA: Diagnosis not present

## 2016-12-23 DIAGNOSIS — R110 Nausea: Secondary | ICD-10-CM

## 2016-12-23 LAB — COMPREHENSIVE METABOLIC PANEL
ALK PHOS: 82 U/L (ref 40–150)
ALT: 7 U/L (ref 0–55)
AST: 18 U/L (ref 5–34)
Albumin: 3.5 g/dL (ref 3.5–5.0)
Anion Gap: 8 mEq/L (ref 3–11)
BILIRUBIN TOTAL: 0.53 mg/dL (ref 0.20–1.20)
BUN: 6.4 mg/dL — AB (ref 7.0–26.0)
CO2: 27 mEq/L (ref 22–29)
Calcium: 10.1 mg/dL (ref 8.4–10.4)
Chloride: 101 mEq/L (ref 98–109)
Creatinine: 0.7 mg/dL (ref 0.6–1.1)
EGFR: >90 mL/min/{1.73_m2} (ref >90)
GLUCOSE: 90 mg/dL (ref 70–140)
Potassium: 4.3 mEq/L (ref 3.5–5.1)
SODIUM: 137 meq/L (ref 136–145)
TOTAL PROTEIN: 6.2 g/dL — AB (ref 6.4–8.3)

## 2016-12-23 LAB — CBC & DIFF AND RETIC
BASO%: 1.7 % (ref 0.0–2.0)
Basophils Absolute: 0.2 10*3/uL — ABNORMAL HIGH (ref 0.0–0.1)
EOS ABS: 0 10*3/uL (ref 0.0–0.5)
EOS%: 0.4 % (ref 0.0–7.0)
HCT: 29.7 % — ABNORMAL LOW (ref 34.8–46.6)
HEMOGLOBIN: 9.5 g/dL — AB (ref 11.6–15.9)
IMMATURE RETIC FRACT: 22.6 % — AB (ref 1.60–10.00)
LYMPH%: 4.4 % — ABNORMAL LOW (ref 14.0–49.7)
MCH: 27.8 pg (ref 25.1–34.0)
MCHC: 32.1 g/dL (ref 31.5–36.0)
MCV: 86.6 fL (ref 79.5–101.0)
MONO#: 1.4 10*3/uL — AB (ref 0.1–0.9)
MONO%: 11.6 % (ref 0.0–14.0)
NEUT%: 81.9 % — ABNORMAL HIGH (ref 38.4–76.8)
NEUTROS ABS: 10.2 10*3/uL — AB (ref 1.5–6.5)
Platelets: 500 10*3/uL — ABNORMAL HIGH (ref 145–400)
RBC: 3.44 10*6/uL — ABNORMAL LOW (ref 3.70–5.45)
RDW: 20.4 % — AB (ref 11.2–14.5)
RETIC %: 2.56 % — AB (ref 0.70–2.10)
Retic Ct Abs: 88.06 10*3/uL (ref 33.70–90.70)
WBC: 12.4 10*3/uL — AB (ref 3.9–10.3)
lymph#: 0.5 10*3/uL — ABNORMAL LOW (ref 0.9–3.3)

## 2016-12-23 LAB — MAGNESIUM: Magnesium: 1.4 mg/dl — CL (ref 1.5–2.5)

## 2016-12-23 MED ORDER — SODIUM CHLORIDE 0.9 % IV SOLN
500.0000 mg/m2 | Freq: Once | INTRAVENOUS | Status: AC
  Administered 2016-12-23: 720 mg via INTRAVENOUS
  Filled 2016-12-23: qty 36

## 2016-12-23 MED ORDER — ACETAMINOPHEN 325 MG PO TABS
ORAL_TABLET | ORAL | Status: AC
  Filled 2016-12-23: qty 2

## 2016-12-23 MED ORDER — AZITHROMYCIN 250 MG PO TABS: ORAL_TABLET | ORAL | 0 refills | Status: DC

## 2016-12-23 MED ORDER — DEXAMETHASONE SODIUM PHOSPHATE 10 MG/ML IJ SOLN
10.0000 mg | Freq: Once | INTRAMUSCULAR | Status: AC
  Administered 2016-12-23: 10 mg via INTRAVENOUS

## 2016-12-23 MED ORDER — DIPHENHYDRAMINE HCL 25 MG PO CAPS
ORAL_CAPSULE | ORAL | Status: AC
  Filled 2016-12-23: qty 2

## 2016-12-23 MED ORDER — VINCRISTINE SULFATE CHEMO INJECTION 1 MG/ML
1.0000 mg | Freq: Once | INTRAVENOUS | Status: AC
  Administered 2016-12-23: 1 mg via INTRAVENOUS
  Filled 2016-12-23: qty 1

## 2016-12-23 MED ORDER — HEPARIN SOD (PORK) LOCK FLUSH 100 UNIT/ML IV SOLN
500.0000 [IU] | Freq: Once | INTRAVENOUS | Status: AC | PRN
  Administered 2016-12-23: 500 [IU]
  Filled 2016-12-23: qty 5

## 2016-12-23 MED ORDER — SODIUM CHLORIDE 0.9 % IV SOLN
Freq: Once | INTRAVENOUS | Status: AC
  Administered 2016-12-23: 12:00:00 via INTRAVENOUS

## 2016-12-23 MED ORDER — PALONOSETRON HCL INJECTION 0.25 MG/5ML
0.2500 mg | Freq: Once | INTRAVENOUS | Status: AC
  Administered 2016-12-23: 0.25 mg via INTRAVENOUS

## 2016-12-23 MED ORDER — PALONOSETRON HCL INJECTION 0.25 MG/5ML
INTRAVENOUS | Status: AC
  Filled 2016-12-23: qty 5

## 2016-12-23 MED ORDER — PROCHLORPERAZINE MALEATE 10 MG PO TABS: 10.0000 mg | ORAL_TABLET | Freq: Four times a day (QID) | ORAL | 0 refills | Status: AC | PRN

## 2016-12-23 MED ORDER — DIPHENHYDRAMINE HCL 25 MG PO CAPS
50.0000 mg | ORAL_CAPSULE | Freq: Once | ORAL | Status: AC
  Administered 2016-12-23: 50 mg via ORAL

## 2016-12-23 MED ORDER — SODIUM CHLORIDE 0.9% FLUSH
10.0000 mL | INTRAVENOUS | Status: DC | PRN
Start: 2016-12-23 — End: 2016-12-23
  Administered 2016-12-23: 10 mL
  Filled 2016-12-23: qty 10

## 2016-12-23 MED ORDER — SODIUM CHLORIDE 0.9 % IV SOLN
375.0000 mg/m2 | Freq: Once | INTRAVENOUS | Status: AC
  Administered 2016-12-23: 500 mg via INTRAVENOUS
  Filled 2016-12-23: qty 50

## 2016-12-23 MED ORDER — SODIUM CHLORIDE 0.9 % IV SOLN
1000.0000 mg | Freq: Once | INTRAVENOUS | Status: AC
  Administered 2016-12-23: 1000 mg via INTRAVENOUS
  Filled 2016-12-23: qty 2

## 2016-12-23 MED ORDER — DEXAMETHASONE SODIUM PHOSPHATE 10 MG/ML IJ SOLN
INTRAMUSCULAR | Status: AC
  Filled 2016-12-23: qty 1

## 2016-12-23 MED ORDER — ACETAMINOPHEN 325 MG PO TABS
650.0000 mg | ORAL_TABLET | Freq: Once | ORAL | Status: AC
  Administered 2016-12-23: 650 mg via ORAL

## 2016-12-23 MED ORDER — DOXORUBICIN HCL CHEMO IV INJECTION 2 MG/ML
50.0000 mg/m2 | Freq: Once | INTRAVENOUS | Status: AC
  Administered 2016-12-23: 72 mg via INTRAVENOUS
  Filled 2016-12-23: qty 36

## 2016-12-23 MED ORDER — ONDANSETRON HCL 8 MG PO TABS: 8.0000 mg | ORAL_TABLET | Freq: Three times a day (TID) | ORAL | 0 refills | Status: AC | PRN

## 2016-12-23 NOTE — Patient Instructions (Signed)
[facility] Discharge Instructions for Patients Receiving Chemotherapy  Today you received the following chemotherapy agents Doxirubicin, Vincristine, Cytoxan, Rituxan  To help prevent nausea and vomiting after your treatment, we encourage you to take your nausea medication    If you develop nausea and vomiting that is not controlled by your nausea medication, call the clinic.   BELOW ARE SYMPTOMS THAT SHOULD BE REPORTED IMMEDIATELY:  *FEVER GREATER THAN 100.5 F  *CHILLS WITH OR WITHOUT FEVER  NAUSEA AND VOMITING THAT IS NOT CONTROLLED WITH YOUR NAUSEA MEDICATION  *UNUSUAL SHORTNESS OF BREATH  *UNUSUAL BRUISING OR BLEEDING  TENDERNESS IN MOUTH AND THROAT WITH OR WITHOUT PRESENCE OF ULCERS  *URINARY PROBLEMS  *BOWEL PROBLEMS  UNUSUAL RASH Items with * indicate a potential emergency and should be followed up as soon as possible.  Feel free to call the clinic you have any questions or concerns. The clinic phone number is [phone].  Please show the [place] at check-in to the Emergency Department and triage nurse.

## 2016-12-23 NOTE — Progress Notes (Signed)
Brief follow up with patient. No new weight. Patient continues to have a poor appetite and taste changes which limit oral intake. Patient refuses to drink oral nutrition supplements and force herself to eat. She blames her family for not buying her food she likes.  Nutrition Diagnosis: Severe malnutrition continues.  Intervention: Again offered strategies for increased po intake.  Monitoring, Evaluation, Goals: Patient will increase oral intake to minimize weight loss.  No follow up scheduled. Patient has my contact information.

## 2016-12-24 ENCOUNTER — Telehealth: Payer: Self-pay

## 2016-12-24 NOTE — Telephone Encounter (Signed)
Pt's daughter understands what the azithromycin is for after communication with her mother. Pt was prescribed something with her PCP, but stopped taking it before beginning the course of azithromycin from Dr. Irene Limbo.

## 2016-12-25 ENCOUNTER — Telehealth: Payer: Self-pay | Admitting: Hematology

## 2016-12-25 ENCOUNTER — Ambulatory Visit (HOSPITAL_BASED_OUTPATIENT_CLINIC_OR_DEPARTMENT_OTHER): Payer: Medicare Other

## 2016-12-25 VITALS — BP 130/74 | HR 85 | Temp 97.9°F | Resp 18

## 2016-12-25 DIAGNOSIS — Z5189 Encounter for other specified aftercare: Secondary | ICD-10-CM | POA: Diagnosis not present

## 2016-12-25 DIAGNOSIS — C8338 Diffuse large B-cell lymphoma, lymph nodes of multiple sites: Secondary | ICD-10-CM

## 2016-12-25 MED ORDER — PEGFILGRASTIM INJECTION 6 MG/0.6ML ~~LOC~~
6.0000 mg | PREFILLED_SYRINGE | Freq: Once | SUBCUTANEOUS | Status: AC
  Administered 2016-12-25: 6 mg via SUBCUTANEOUS
  Filled 2016-12-25: qty 0.6

## 2016-12-25 NOTE — Patient Instructions (Signed)
Pegfilgrastim injection What is this medicine? PEGFILGRASTIM (PEG fil gra stim) is a long-acting granulocyte colony-stimulating factor that stimulates the growth of neutrophils, a type of white blood cell important in the body's fight against infection. It is used to reduce the incidence of fever and infection in patients with certain types of cancer who are receiving chemotherapy that affects the bone marrow, and to increase survival after being exposed to high doses of radiation. This medicine may be used for other purposes; ask your health care provider or pharmacist if you have questions. COMMON BRAND NAME(S): Neulasta What should I tell my health care provider before I take this medicine? They need to know if you have any of these conditions: -kidney disease -latex allergy -ongoing radiation therapy -sickle cell disease -skin reactions to acrylic adhesives (On-Body Injector only) -an unusual or allergic reaction to pegfilgrastim, filgrastim, other medicines, foods, dyes, or preservatives -pregnant or trying to get pregnant -breast-feeding How should I use this medicine? This medicine is for injection under the skin. If you get this medicine at home, you will be taught how to prepare and give the pre-filled syringe or how to use the On-body Injector. Refer to the patient Instructions for Use for detailed instructions. Use exactly as directed. Tell your healthcare provider immediately if you suspect that the On-body Injector may not have performed as intended or if you suspect the use of the On-body Injector resulted in a missed or partial dose. It is important that you put your used needles and syringes in a special sharps container. Do not put them in a trash can. If you do not have a sharps container, call your pharmacist or healthcare provider to get one. Talk to your pediatrician regarding the use of this medicine in children. While this drug may be prescribed for selected conditions,  precautions do apply. Overdosage: If you think you have taken too much of this medicine contact a poison control center or emergency room at once. NOTE: This medicine is only for you. Do not share this medicine with others. What if I miss a dose? It is important not to miss your dose. Call your doctor or health care professional if you miss your dose. If you miss a dose due to an On-body Injector failure or leakage, a new dose should be administered as soon as possible using a single prefilled syringe for manual use. What may interact with this medicine? Interactions have not been studied. Give your health care provider a list of all the medicines, herbs, non-prescription drugs, or dietary supplements you use. Also tell them if you smoke, drink alcohol, or use illegal drugs. Some items may interact with your medicine. This list may not describe all possible interactions. Give your health care provider a list of all the medicines, herbs, non-prescription drugs, or dietary supplements you use. Also tell them if you smoke, drink alcohol, or use illegal drugs. Some items may interact with your medicine. What should I watch for while using this medicine? You may need blood work done while you are taking this medicine. If you are going to need a MRI, CT scan, or other procedure, tell your doctor that you are using this medicine (On-Body Injector only). What side effects may I notice from receiving this medicine? Side effects that you should report to your doctor or health care professional as soon as possible: -allergic reactions like skin rash, itching or hives, swelling of the face, lips, or tongue -dizziness -fever -pain, redness, or irritation at site   where injected -pinpoint red spots on the skin -red or dark-brown urine -shortness of breath or breathing problems -stomach or side pain, or pain at the shoulder -swelling -tiredness -trouble passing urine or change in the amount of urine Side  effects that usually do not require medical attention (report to your doctor or health care professional if they continue or are bothersome): -bone pain -muscle pain This list may not describe all possible side effects. Call your doctor for medical advice about side effects. You may report side effects to FDA at 1-800-FDA-1088. Where should I keep my medicine? Keep out of the reach of children. Store pre-filled syringes in a refrigerator between 2 and 8 degrees C (36 and 46 degrees F). Do not freeze. Keep in carton to protect from light. Throw away this medicine if it is left out of the refrigerator for more than 48 hours. Throw away any unused medicine after the expiration date. NOTE: This sheet is a summary. It may not cover all possible information. If you have questions about this medicine, talk to your doctor, pharmacist, or health care provider.  2018 [employer] (2016-03-28 12:58:03)  

## 2016-12-25 NOTE — Telephone Encounter (Signed)
Left voicemail for patient regarding her upcoming appts in October.

## 2017-01-03 ENCOUNTER — Telehealth: Payer: Self-pay | Admitting: *Deleted

## 2017-01-03 NOTE — Telephone Encounter (Signed)
Pt's daughter called requesting assistance for pt with ADL's and meal assistance.  Informed daughter that message would be relayed to Dr. Irene Limbo and referral would be placed if appropriate.  Daughter verbalized understanding.

## 2017-01-06 ENCOUNTER — Ambulatory Visit: Payer: Medicare Other | Admitting: Physical Therapy

## 2017-01-06 ENCOUNTER — Other Ambulatory Visit: Payer: Self-pay

## 2017-01-06 MED ORDER — MAGNESIUM OXIDE 400 (241.3 MG) MG PO TABS: 400.0000 mg | ORAL_TABLET | Freq: Two times a day (BID) | ORAL | 0 refills | Status: DC

## 2017-01-09 ENCOUNTER — Telehealth: Payer: Self-pay

## 2017-01-09 NOTE — Telephone Encounter (Signed)
Received call from [place], Social Work, around [employer] . She is looking into what pt may qualify for in regards to ADL and meal assistance.

## 2017-01-10 ENCOUNTER — Encounter: Payer: Self-pay | Admitting: *Deleted

## 2017-01-10 NOTE — Progress Notes (Signed)
[place] Work  Clinical Social Work was referred by Therapist, sports for resources to assist with ADLs at home, including cooking meals.  Clinical Social Worker attempted to contact patient's daughter (who originally requested on patient's behalf) to offer support and assess for needs.  CSW left voicemail to return call.   Maryjean Morn, MSW, LCSW, OSW-C Clinical Social Worker [facility] [phone]

## 2017-01-15 ENCOUNTER — Ambulatory Visit: Payer: Medicare Other | Attending: Hematology | Admitting: Physical Therapy

## 2017-01-15 ENCOUNTER — Ambulatory Visit (HOSPITAL_COMMUNITY)
Admission: RE | Admit: 2017-01-15 | Discharge: 2017-01-15 | Disposition: A | Discharge status: Home / Self Care | Payer: Medicare Other | Source: Ambulatory Visit | Attending: Hematology | Admitting: Hematology

## 2017-01-15 DIAGNOSIS — C8338 Diffuse large B-cell lymphoma, lymph nodes of multiple sites: Secondary | ICD-10-CM | POA: Diagnosis present

## 2017-01-15 DIAGNOSIS — R599 Enlarged lymph nodes, unspecified: Secondary | ICD-10-CM | POA: Insufficient documentation

## 2017-01-15 DIAGNOSIS — M6281 Muscle weakness (generalized): Secondary | ICD-10-CM | POA: Diagnosis present

## 2017-01-15 DIAGNOSIS — R600 Localized edema: Secondary | ICD-10-CM | POA: Insufficient documentation

## 2017-01-15 DIAGNOSIS — N1330 Unspecified hydronephrosis: Secondary | ICD-10-CM | POA: Insufficient documentation

## 2017-01-15 DIAGNOSIS — N2889 Other specified disorders of kidney and ureter: Secondary | ICD-10-CM | POA: Diagnosis not present

## 2017-01-15 DIAGNOSIS — N8189 Other female genital prolapse: Secondary | ICD-10-CM | POA: Insufficient documentation

## 2017-01-15 DIAGNOSIS — R188 Other ascites: Secondary | ICD-10-CM | POA: Insufficient documentation

## 2017-01-15 DIAGNOSIS — I517 Cardiomegaly: Secondary | ICD-10-CM | POA: Insufficient documentation

## 2017-01-15 LAB — GLUCOSE, CAPILLARY: Glucose-Capillary: 89 mg/dL (ref 65–99)

## 2017-01-15 MED ORDER — FLUDEOXYGLUCOSE F - 18 (FDG) INJECTION
5.0000 | Freq: Once | INTRAVENOUS | Status: AC | PRN
  Administered 2017-01-15: 5 via INTRAVENOUS

## 2017-01-15 NOTE — Therapy (Addendum)
[place], [place], [address] Phone: [phone]   Fax:  [phone]  Physical Therapy Evaluation  Patient Details  Name: Christina Peterson MRN: [id number] Date of Birth: 1940-11-02 Referring Provider: Dr. Sullivan Lone  Encounter Date: 01/15/2017      PT End of Session - 01/15/17 1317    Visit Number 1   Number of Visits 19   Date for PT Re-Evaluation 03/14/17   PT Start Time 1103   PT Stop Time 1155   PT Time Calculation (min) 52 min   Activity Tolerance Patient tolerated treatment well   Behavior During Therapy [facility] for tasks assessed/performed      Past Medical History:  Diagnosis Date  . Arthritis   . Diffuse large B-cell lymphoma of lymph nodes of multiple regions ([place]) 08/22/2016  . Hypercholesteremia   . Hypertension    FOLLOWED BY DR Katherine Roan  . Mitral valve prolapse -  severe MR 09/20/2016   Posterior Leaflet  . Severe mitral regurgitation: Per 2 D echo 08/23/2016 08/23/2016   .posterior leaflet prolapse of mitral valve with anteriorly directed mitral regurgitation. Severe. Severely dilated left atrium.  . Severe protein-calorie malnutrition ([place])     Past Surgical History:  Procedure Laterality Date  . ABDOMINAL HYSTERECTOMY    . IR FLUORO GUIDE PORT INSERTION RIGHT  08/23/2016  . IR US GUIDE VASC ACCESS RIGHT  08/23/2016  . JOINT REPLACEMENT    . 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 TOTAL HIP REVISION  05/02/2011   Procedure: TOTAL HIP REVISION;  Surgeon: Sharmon Revere, MD;  Location: [place];  Service: Orthopedics;  Laterality: Left;  left total hip revision with bone grafting  . TRANSTHORACIC ECHOCARDIOGRAM  08/23/2013    EF 60-65%. GR 2 DD.posterior leaflet prolapse of mitral valve with anteriorly directed mitral regurgitation. Severe. Severely dilated left atrium.    There were no vitals filed for this visit.       Subjective Assessment - 01/15/17 1112    Subjective I  came in for help with swelling for my feet.   Patient is accompained by: Family member  daughters (who took turns, one at a time)   Pertinent History Diagnosis is large B cell lymphoma. She has finished chemotherapy for now. Just had a CAT scan today to check on status. h/o left total hip replacement in 2010. Arthritis, especially in hands.   Patient Stated Goals get help with swelling in the feet   Currently in Pain? Yes   Pain Score 10-Worst pain ever   Pain Location Foot   Pain Orientation Right;Left;Other (Comment)  dorsal   Pain Descriptors / Indicators Sharp   Aggravating Factors  shoes being tight   Pain Relieving Factors rubbing with alcohol            [facility] PT Assessment - 01/15/17 0001      Assessment   Medical Diagnosis large B cell lymphoma   Referring Provider Dr. Sullivan Lone   [place] Date/Surgical Date 07/28/16  approx.   Hand Dominance Right   Prior Therapy none     Precautions   Precautions Fall;Other (comment)   Precaution Comments cancer precautions     Restrictions   Weight Bearing Restrictions No     Balance Screen   Has the patient fallen in the past 6 months No   Has the patient had a decrease in activity level because of a fear of falling?  No  Is the patient reluctant to leave their home because of a fear of falling?  No     Home Environment   Living Environment Private residence   Living Arrangements Alone   Type of Home Apartment  senior citizen building     Prior Function   Level of Independence Needs assistance with homemaking;Independent with household mobility with device;Independent with basic ADLs   Vocation Retired   Leisure says she's too sorry (lazy, tired) to get up and walk down the hall     Cognition   Overall Cognitive Status Within Functional Limits for tasks assessed     Observation/Other Assessments   Observations very thin older woman with obviously arthritic hands     Posture/Postural Control   Posture/Postural  Control Postural limitations   Postural Limitations Forward head;Rounded Shoulders     ROM / Strength   AROM / PROM / Strength AROM     AROM   Overall AROM Comments Both UEs WFL, but with moderatel shoulder limitations for age and arthritis; right LE grossly WFL; left hip with mild-moderate AROM limitations, she reports from her total hip replacement; other LE joints on left okay.     Ambulation/Gait   Ambulation/Gait Yes   Assistive device Rollator  rollator outside, straight cane in the house   Gait Comments very stable gait with rollator, and walks at a good pace           LYMPHEDEMA/ONCOLOGY QUESTIONNAIRE - 01/15/17 1132      Type   Cancer Type large B cell lymphoma     Treatment   Past Chemotherapy Treatment Yes     What other symptoms do you have   Are you Having Heaviness or Tightness Yes   Are you having pitting edema Yes   Stemmer Sign No     Lymphedema Assessments   Lymphedema Assessments Lower extremities     Right Lower Extremity Lymphedema   30 cm Proximal to Floor at Lateral Plantar Foot 29 cm   20 cm Proximal to Floor at Lateral Plantar Foot 23.6 1   10  cm Proximal to Floor at Lateral Malleoli 23.5 cm   5 cm Proximal to 1st MTP Joint 22.3 cm   Across MTP Joint 22.4 cm   Around Proximal Great Toe 7.5 cm   Other measurements done in long sitting (recline) position     Left Lower Extremity Lymphedema   30 cm Proximal to Floor at Lateral Plantar Foot 26.8 cm   20 cm Proximal to Floor at Lateral Plantar Foot 22.1 cm   10 cm Proximal to Floor at Lateral Malleoli 23.7 cm   5 cm Proximal to 1st MTP Joint 22 cm   Across MTP Joint 22.1 cm   Around Proximal Great Toe 7.3 cm         Objective measurements completed on examination: See above findings.                  PT Education - 01/15/17 1333    Education provided Yes   Education Details about treatment options, about compression stockings, about availability of lymphedema pump (but not  recommended in this case)   Person(s) Educated Patient;Child(ren)   Methods Explanation   Comprehension Need further instruction           Short Term Clinic Goals - 01/15/17 1328      CC Short Term Goal  #1   Title Pt. will have a reduction of at least 0.5 cm. in circumferences of  both legs at 10 cm. proximal to floor at lataeral malleoli   Baseline right 23.5 cm. and left 23.7 cm. at eval   Time 3   Period Weeks   Status New     CC Short Term Goal  #2   Title reduction of at least 0.5 cm. at 5 cm. proximal to 1st MTP on both feet   Baseline 22.3 cm. on right and 22 cm. on left at eval   Time 3   Period Weeks   Status New             Long Term Clinic Goals - 01/15/17 1331      CC Long Term Goal  #1   Title circumference reduction of at least 1 cm. at 10 cm. proximal to floor at lateral malleoli on both legs   Baseline 23.5 cm. on right and 23.7 cm. on left at eval   Time 4   Period Weeks   Status New     CC Long Term Goal  #2   Title Pt. will have been fitted for compression knee highs for both legs   Time 6   Period Weeks   Status New     CC Long Term Goal  #3   Title Pt. will be knowledgeable about self-care for swelling   Time 6   Period Weeks   Status New             Plan - 01/15/17 1318    Clinical Impression Statement This is a pleasant woman accompanied today by a rotation of 3 daughters. She has a diagnosis of large B cell lymphoma and has completed one course of chemotherapy.  She will have a CT scan today to see how she is faring.  She complains today of foot and ankle swelling; this seems to make it difficult for her to lift her legs.  She walks well using a rollator walker, and reports using a straight cane at home. She has visibly arthritic fingers and appears to have some A/ROM limitation at her shoulders, presumably from arthritis. She has had a left total hip replacement a couple of years ago.   History and Personal Factors relevant to  plan of care: h/o left total hip replacement, significant arthritis, needs help with some tasks   Clinical Presentation Evolving   Clinical Presentation due to: recent chemotherapy and possible need for further treatment   Clinical Decision Making Moderate   Rehab Potential Good   PT Frequency 3x / week   PT Duration 6 weeks   PT Treatment/Interventions ADLs/Self Care Home Management;DME Instruction;Gait training;Stair training;Therapeutic exercise;Balance training;Neuromuscular re-education;Patient/family education;Orthotic Fit/Training;Manual techniques;Manual lymph drainage;Compression bandaging;Passive range of motion   PT Next Visit Plan Begin complete decongestive therapy for both feet and ankles (bandaging to knees only); later, assist with obtaining compression knee-highs through Medicaid.    Consulted and Agree with Plan of Care Patient      Patient will benefit from skilled therapeutic intervention in order to improve the following deficits and impairments:  Increased edema, Decreased strength, Impaired flexibility, Difficulty walking  Visit Diagnosis: Localized edema - Plan: PT plan of care cert/re-cert  Muscle weakness (generalized) - Plan: PT plan of care cert/re-cert      G-Codes - 54/62/70 1333    Functional Assessment Tool Used (Outpatient Only) clinical judgement   Functional Limitation Self care  for foot and ankle swelling   Self Care Current Status (J5009) At least 60 percent but less than 80 percent  impaired, limited or restricted   Self Care Goal Status [phone]) At least 20 percent but less than 40 percent impaired, limited or restricted       Problem List Patient Active Problem List   Diagnosis Date Noted  . Mitral valve prolapse -  severe MR 09/20/2016  . Aspiration pneumonia due to inhalation of vomitus ([place]) 09/12/2016  . Hypokalemia 09/12/2016  . Counseling regarding advanced care planning and goals of care   . Sepsis ([place]) 09/07/2016  . Nausea and  vomiting 09/07/2016  . Ileus ([place]) 09/07/2016  . Severe mitral regurgitation: Per 2 D echo 08/23/2016 08/23/2016  . Diffuse large B-cell lymphoma of lymph nodes of multiple regions ([place]) 08/22/2016  . B-cell lymphoma ([place])   . Renal cell adenocarcinoma ([place])   . History of peristent atrial fibrillation 08/20/2016  . Abdominal aortic atherosclerosis ([place]) - noted on CT scan 08/20/2016  . Severe protein-calorie malnutrition ([place])   . Hyponatremia 08/11/2016  . Normocytic normochromic anemia 08/11/2016  . Essential hypertension 08/11/2016  . Constipation 08/11/2016  . Hyperlipidemia 08/11/2016  . Abdominal pain 08/11/2016  . Renal mass, right 08/11/2016  . Adenopathy 08/11/2016  . Other intra-abdominal and pelvic swelling, mass and lump 08/11/2016  . Pain in left hip 06/05/2016    SALISBURY,DONNA 01/15/2017, 1:36 PM  [place], [place], [address] Phone: [phone]   Fax:  [phone]  Name: NIAH HEINLE MRN: [id number] Date of Birth: 06-Feb-1941  Serafina Royals, PT 01/15/17 1:36 PM  Serafina Royals, PT 01/20/17 11:41 AM

## 2017-01-16 ENCOUNTER — Telehealth: Payer: Self-pay | Admitting: *Deleted

## 2017-01-16 NOTE — Telephone Encounter (Signed)
"  This is  Deicy Rusk [phone]), calling to ask Dr.Kale if it is okay for mom to travel.  We plan to leave tomorrow morning at 9:00 am for  A funeral in [place].  A large group of [place] will be in a Minivan for six to seven hours.  We will make two stops.  Plan to return Sunday, October 7th.  She has swelling in her legs but not experiencing any side effects or symptoms at this time.  Call me to let me know if she can travel."     Routing call information to collaborative nurse and provider for review.  Further patient communication through collaborative nurse. This nurse instructions dependent upon provider orders to elevate legs on seat of minivan not able to be done per Altha Harm due to [place] being full.  Be aware of hospitals and Urgent Cares en route and in [place] before leaving.  Pack thermometer, scheduled and as needed medications for travel.

## 2017-01-20 ENCOUNTER — Ambulatory Visit (HOSPITAL_COMMUNITY)
Admission: RE | Admit: 2017-01-20 | Discharge: 2017-01-20 | Disposition: A | Discharge status: Home / Self Care | Payer: Medicare Other | Source: Ambulatory Visit | Attending: Cardiology | Admitting: Cardiology

## 2017-01-20 DIAGNOSIS — E785 Hyperlipidemia, unspecified: Secondary | ICD-10-CM | POA: Insufficient documentation

## 2017-01-20 DIAGNOSIS — I083 Combined rheumatic disorders of mitral, aortic and tricuspid valves: Secondary | ICD-10-CM | POA: Insufficient documentation

## 2017-01-20 DIAGNOSIS — I481 Persistent atrial fibrillation: Secondary | ICD-10-CM | POA: Diagnosis not present

## 2017-01-20 DIAGNOSIS — I10 Essential (primary) hypertension: Secondary | ICD-10-CM | POA: Diagnosis not present

## 2017-01-20 DIAGNOSIS — I340 Nonrheumatic mitral (valve) insufficiency: Secondary | ICD-10-CM | POA: Diagnosis present

## 2017-01-20 NOTE — Progress Notes (Signed)
  Echocardiogram 2D Echocardiogram has been performed.  Dewell Monnier L Androw 01/20/2017, 10:12 AM

## 2017-01-21 NOTE — Progress Notes (Signed)
Echocardiogram essentially confirms what we saw mitral valve with severe mitral regurgitation It looks like the pump function maybe a little bit worse for the same. This is probably easier to discuss in the clinic visit -- we need to check on symptoms & discuss possible treatment options.  She is due to see me in October - can discuss then.  Glenetta Hew, MD  Pls forward to PCP: Smothers, Andree Elk, NP

## 2017-01-22 ENCOUNTER — Ambulatory Visit: Payer: Medicare Other

## 2017-01-22 DIAGNOSIS — R600 Localized edema: Secondary | ICD-10-CM | POA: Diagnosis not present

## 2017-01-22 NOTE — Therapy (Signed)
[place], [place], [address] Phone: [phone]   Fax:  [phone]  Physical Therapy Treatment  Patient Details  Name: Christina Peterson MRN: [id number] Date of Birth: [birth year] Referring Provider: Dr. Sullivan Lone  Encounter Date: 01/22/2017      PT End of Session - 01/22/17 0931    Visit Number 2   Number of Visits 19   Date for PT Re-Evaluation 03/14/17   PT Start Time 6378   PT Stop Time 0926   PT Time Calculation (min) 39 min   Activity Tolerance Patient tolerated treatment well   Behavior During Therapy [facility] for tasks assessed/performed      Past Medical History:  Diagnosis Date  . Arthritis   . Diffuse large B-cell lymphoma of lymph nodes of multiple regions ([place]) 08/22/2016  . Hypercholesteremia   . Hypertension    FOLLOWED BY DR Katherine Roan  . Mitral valve prolapse -  severe MR 09/20/2016   Posterior Leaflet  . Severe mitral regurgitation: Per 2 D echo 08/23/2016 08/23/2016   .posterior leaflet prolapse of mitral valve with anteriorly directed mitral regurgitation. Severe. Severely dilated left atrium.  . Severe protein-calorie malnutrition ([place])     Past Surgical History:  Procedure Laterality Date  . ABDOMINAL HYSTERECTOMY    . IR FLUORO GUIDE PORT INSERTION RIGHT  08/23/2016  . IR US GUIDE VASC ACCESS RIGHT  08/23/2016  . JOINT REPLACEMENT    . 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 TOTAL HIP REVISION  05/02/2011   Procedure: TOTAL HIP REVISION;  Surgeon: Sharmon Revere, MD;  Location: [place];  Service: Orthopedics;  Laterality: Left;  left total hip revision with bone grafting  . TRANSTHORACIC ECHOCARDIOGRAM  08/23/2013    EF 60-65%. GR 2 DD.posterior leaflet prolapse of mitral valve with anteriorly directed mitral regurgitation. Severe. Severely dilated left atrium.    There were no vitals filed for this visit.      Subjective Assessment - 01/22/17 0849    Subjective I'm  ready to get the swelling out of my feet. The top of my feet hurt me the worse the swelling gets.    Pertinent History Diagnosis is large B cell lymphoma. She has finished chemotherapy for now. Just had a CAT scan today to check on status. h/o left total hip replacement in 2010. Arthritis, especially in hands.   Patient Stated Goals get help with swelling in the feet   Currently in Pain? Yes   Pain Score 4    Pain Location Foot   Pain Orientation Right;Left;Other (Comment)  dorsal   Pain Descriptors / Indicators Dull   Aggravating Factors  tight shoes, increased swelling   Pain Relieving Factors rubbing with witchhazel/alcohol                         [facility] Adult PT Treatment/Exercise - 01/22/17 0001      Manual Therapy   Manual Therapy Manual Lymphatic Drainage (MLD);Compression Bandaging   Manual Lymphatic Drainage (MLD) In Supine: Short neck, superficial and deep abdominals, Rt axillary nodes, Rt inguino-axillary anastomosis, then Rt LE from dorsal foot to lateral thigh working from proximal to distal then retracing all steps.    Compression Bandaging To Bil LE's: Biotone lotion, thin stockinette, Elastomull to toes 1-3, Artiflex x1, 1-8 cm (combined roman sandal and ASH technique with this bandage) and 1-10 cm short stretch compression bandage from foot to knee. Placed  surgical shoe covers over feet as pts shoes would not fit over bandages.                    Short Term Clinic Goals - 01/15/17 1328      CC Short Term Goal  #1   Title Pt. will have a reduction of at least 0.5 cm. in circumferences of both legs at 10 cm. proximal to floor at lataeral malleoli   Baseline right 23.5 cm. and left 23.7 cm. at eval   Time 3   Period Weeks   Status New     CC Short Term Goal  #2   Title reduction of at least 0.5 cm. at 5 cm. proximal to 1st MTP on both feet   Baseline 22.3 cm. on right and 22 cm. on left at eval   Time 3   Period Weeks   Status New              Long Term Clinic Goals - 01/15/17 1331      CC Long Term Goal  #1   Title circumference reduction of at least 1 cm. at 10 cm. proximal to floor at lateral malleoli on both legs   Baseline 23.5 cm. on right and 23.7 cm. on left at eval   Time 4   Period Weeks   Status New     CC Long Term Goal  #2   Title Pt. will have been fitted for compression knee highs for both legs   Time 6   Period Weeks   Status New     CC Long Term Goal  #3   Title Pt. will be knowledgeable about self-care for swelling   Time 6   Period Weeks   Status New            Plan - 01/22/17 0932    Clinical Impression Statement Pt tolerated first session very well and verbalized understanding of basics of anatomy of lymphatic system and principles of manual lymph drainage after therapist instructed pt. Decided to bandage both LE's with only 2 bandages as pts LE's are so small in general and swelling is mild.   Rehab Potential Good   PT Frequency 3x / week   PT Duration 6 weeks   PT Treatment/Interventions ADLs/Self Care Home Management;DME Instruction;Gait training;Stair training;Therapeutic exercise;Balance training;Neuromuscular re-education;Patient/family education;Orthotic Fit/Training;Manual techniques;Manual lymph drainage;Compression bandaging;Passive range of motion   PT Next Visit Plan Assess how pt tolerated first bandaging and may or may not need to add another bandage; Cont complete decongestive therapy for both feet and ankles (bandaging to knees only); later, assist with obtaining compression knee-highs through Medicaid.    Consulted and Agree with Plan of Care Patient      Patient will benefit from skilled therapeutic intervention in order to improve the following deficits and impairments:  Increased edema, Decreased strength, Impaired flexibility, Difficulty walking  Visit Diagnosis: Localized edema     Problem List Patient Active Problem List   Diagnosis Date Noted  .  Mitral valve prolapse -  severe MR 09/20/2016  . Aspiration pneumonia due to inhalation of vomitus ([place]) 09/12/2016  . Hypokalemia 09/12/2016  . Counseling regarding advanced care planning and goals of care   . Sepsis ([place]) 09/07/2016  . Nausea and vomiting 09/07/2016  . Ileus ([place]) 09/07/2016  . Severe mitral regurgitation: Per 2 D echo 08/23/2016 08/23/2016  . Diffuse large B-cell lymphoma of lymph nodes of multiple regions ([place]) 08/22/2016  . B-cell  lymphoma ([place])   . Renal cell adenocarcinoma ([place])   . History of peristent atrial fibrillation 08/20/2016  . Abdominal aortic atherosclerosis ([place]) - noted on CT scan 08/20/2016  . Severe protein-calorie malnutrition ([place])   . Hyponatremia 08/11/2016  . Normocytic normochromic anemia 08/11/2016  . Essential hypertension 08/11/2016  . Constipation 08/11/2016  . Hyperlipidemia 08/11/2016  . Abdominal pain 08/11/2016  . Renal mass, right 08/11/2016  . Adenopathy 08/11/2016  . Other intra-abdominal and pelvic swelling, mass and lump 08/11/2016  . Pain in left hip 06/05/2016    Otelia Limes, PTA 01/22/2017, 9:35 AM  [place], [place], [address] Phone: [phone]   Fax:  [phone]  Name: Christina Peterson MRN: [id number] Date of Birth: [birth year]

## 2017-01-24 ENCOUNTER — Ambulatory Visit (HOSPITAL_BASED_OUTPATIENT_CLINIC_OR_DEPARTMENT_OTHER): Payer: Medicare Other

## 2017-01-24 ENCOUNTER — Ambulatory Visit (HOSPITAL_BASED_OUTPATIENT_CLINIC_OR_DEPARTMENT_OTHER): Payer: Medicare Other | Admitting: Hematology

## 2017-01-24 ENCOUNTER — Ambulatory Visit: Payer: Medicare Other | Admitting: Physical Therapy

## 2017-01-24 ENCOUNTER — Telehealth: Payer: Self-pay

## 2017-01-24 VITALS — BP 120/63 | HR 74 | Temp 98.3°F | Resp 24 | Ht 59.0 in | Wt 90.7 lb

## 2017-01-24 DIAGNOSIS — M6281 Muscle weakness (generalized): Secondary | ICD-10-CM

## 2017-01-24 DIAGNOSIS — C8338 Diffuse large B-cell lymphoma, lymph nodes of multiple sites: Secondary | ICD-10-CM | POA: Diagnosis present

## 2017-01-24 DIAGNOSIS — R627 Adult failure to thrive: Secondary | ICD-10-CM | POA: Diagnosis not present

## 2017-01-24 DIAGNOSIS — G893 Neoplasm related pain (acute) (chronic): Secondary | ICD-10-CM

## 2017-01-24 DIAGNOSIS — M7989 Other specified soft tissue disorders: Secondary | ICD-10-CM

## 2017-01-24 DIAGNOSIS — R600 Localized edema: Secondary | ICD-10-CM | POA: Diagnosis not present

## 2017-01-24 DIAGNOSIS — D649 Anemia, unspecified: Secondary | ICD-10-CM

## 2017-01-24 DIAGNOSIS — R5383 Other fatigue: Secondary | ICD-10-CM

## 2017-01-24 LAB — CBC & DIFF AND RETIC
BASO%: 0.4 % (ref 0.0–2.0)
BASOS ABS: 0 10*3/uL (ref 0.0–0.1)
EOS%: 1.2 % (ref 0.0–7.0)
Eosinophils Absolute: 0.1 10*3/uL (ref 0.0–0.5)
HEMATOCRIT: 25.1 % — AB (ref 34.8–46.6)
Immature Retic Fract: 16.3 % — ABNORMAL HIGH (ref 1.60–10.00)
LYMPH%: 7 % — ABNORMAL LOW (ref 14.0–49.7)
MCH: 26.2 pg (ref 25.1–34.0)
MCHC: 31.1 g/dL — ABNORMAL LOW (ref 31.5–36.0)
MCV: 84.3 fL (ref 79.5–101.0)
MONO#: 1.1 10*3/uL — ABNORMAL HIGH (ref 0.1–0.9)
MONO%: 15.7 % — AB (ref 0.0–14.0)
NEUT#: 5.4 10*3/uL (ref 1.5–6.5)
NEUT%: 75.7 % (ref 38.4–76.8)
Platelets: 319 10*3/uL (ref 145–400)
RBC: 2.98 10*6/uL — ABNORMAL LOW (ref 3.70–5.45)
RDW: 22.1 % — ABNORMAL HIGH (ref 11.2–14.5)
Retic %: 4.05 % — ABNORMAL HIGH (ref 0.70–2.10)
Retic Ct Abs: 120.69 10*3/uL — ABNORMAL HIGH (ref 33.70–90.70)
WBC: 7.2 10*3/uL (ref 3.9–10.3)
lymph#: 0.5 10*3/uL — ABNORMAL LOW (ref 0.9–3.3)
nRBC: 0 % (ref 0–0)

## 2017-01-24 LAB — COMPREHENSIVE METABOLIC PANEL
ALBUMIN: 3.1 g/dL — AB (ref 3.5–5.0)
ALT: <6 U/L (ref 0–55)
AST: 17 U/L (ref 5–34)
Alkaline Phosphatase: 66 U/L (ref 40–150)
Anion Gap: 8 mEq/L (ref 3–11)
BUN: 9.6 mg/dL (ref 7.0–26.0)
CALCIUM: 9.6 mg/dL (ref 8.4–10.4)
CO2: 27 mEq/L (ref 22–29)
Chloride: 104 mEq/L (ref 98–109)
Creatinine: 0.7 mg/dL (ref 0.6–1.1)
Glucose: 87 mg/dl (ref 70–140)
POTASSIUM: 4.6 meq/L (ref 3.5–5.1)
Sodium: 139 mEq/L (ref 136–145)
Total Bilirubin: 0.35 mg/dL (ref 0.20–1.20)
Total Protein: 5.8 g/dL — ABNORMAL LOW (ref 6.4–8.3)

## 2017-01-24 LAB — LACTATE DEHYDROGENASE: LDH: 508 U/L — AB (ref 125–245)

## 2017-01-24 MED ORDER — DEXAMETHASONE 2 MG PO TABS: 2.0000 mg | ORAL_TABLET | Freq: Every day | ORAL | 0 refills | Status: DC

## 2017-01-24 NOTE — Patient Instructions (Signed)
PLEASE KEEP YOUR BANDAGES ON AS LONG AS POSSIBLE TO GET THE BEST SWELLING REDUCTION.  OK TO REMOVE BANDAGES ON Sunday!! Should your bandages become uncomfortable or feel too tight, follow these steps: 1. Elevate your extremity higher than your heart.  2. Try to move your arm or leg joints against the firmness of the bandage to help with moving the fluid and allow the bandages to loosen a bit.  3. If the bandaging is still is too tight, it is ok to carefully remove the top layer.  There will still be more layers under it that can provide compression to your extremity. 4. Finally, if you STILL have significant pain after trying these steps, it is ok to take the bandage off.  Check your skin carefully for any signs of irritation  5. PLEASE bring ALL bandage materials back to your next appointment as we will reuse what we can TAKE CARE OF YOUR BANDAGES SO THEY WILL LAST LONGER AND STAY IN BETTER CONDITION Washing bandages:  Wash periodically using a mild detergent in warm water.  Do not use fabric softener or bleach.  Place bandages in a mesh lingerie bag or in a tied off pillow case and use the gentle cycle of the washing machine or hand wash. If you hand wash, you may want to put them in the spin cycle of your washer to get the extra water out, but make sure you put them in a mesh bag first. Do not wring or stretch them while they are wet.  Drying bandages: Lay the bandages out smoothly on a towel away from direct sunlight or heating sources that can damage the fabric. Rolling bandages in a towel and gently squeezing the towel to remove excess water before laying them out can speed up the process.  If you use a drying rack, place a towel on top of the rack to lay the bandages on.  If they hang down to dry, they fabric could be stretched out and the bandage will lose its compression.   Or, keep bandages in the mesh bag and dry them in the dryer on the low or no heat cycle. Rolling bandages: Please roll your  bandages after drying them so they are ready for your next treatment. If they are rolled too loose, they will be difficult to apply.  If rolled too tight, they can get stretched out.   TAKE CARE OF YOUR SKIN 1. Apply a low pH moisturizing lotion to your skin daily 2. Avoid scratching your skin 3. Treat skin irritations quickly  4. Know the 5 warning signs of infection: redness, pain, warmth to touch, fever and increased swelling.  Call your physician immediately if you notice any of these signs of a possible infection.

## 2017-01-24 NOTE — Therapy (Signed)
[place], [place], [address] Phone: [phone]   Fax:  [phone]  Physical Therapy Treatment  Patient Details  Name: Christina Peterson MRN: [id number] Date of Birth: Aug 21, 1940 Referring Provider: Dr. Sullivan Lone  Encounter Date: 01/24/2017      PT End of Session - 01/24/17 1220    Visit Number 3   Number of Visits 19   Date for PT Re-Evaluation 03/14/17   PT Start Time 0845   PT Stop Time 0931   PT Time Calculation (min) 46 min      Past Medical History:  Diagnosis Date  . Arthritis   . Diffuse large B-cell lymphoma of lymph nodes of multiple regions ([place]) 08/22/2016  . Hypercholesteremia   . Hypertension    FOLLOWED BY DR Katherine Roan  . Mitral valve prolapse -  severe MR 09/20/2016   Posterior Leaflet  . Severe mitral regurgitation: Per 2 D echo 08/23/2016 08/23/2016   .posterior leaflet prolapse of mitral valve with anteriorly directed mitral regurgitation. Severe. Severely dilated left atrium.  . Severe protein-calorie malnutrition ([place])     Past Surgical History:  Procedure Laterality Date  . ABDOMINAL HYSTERECTOMY    . IR FLUORO GUIDE PORT INSERTION RIGHT  08/23/2016  . IR US GUIDE VASC ACCESS RIGHT  08/23/2016  . JOINT REPLACEMENT    . 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 TOTAL HIP REVISION  05/02/2011   Procedure: TOTAL HIP REVISION;  Surgeon: Sharmon Revere, MD;  Location: [facility];  Service: Orthopedics;  Laterality: Left;  left total hip revision with bone grafting  . TRANSTHORACIC ECHOCARDIOGRAM  08/23/2013    EF 60-65%. GR 2 DD.posterior leaflet prolapse of mitral valve with anteriorly directed mitral regurgitation. Severe. Severely dilated left atrium.    There were no vitals filed for this visit.      Subjective Assessment - 01/24/17 0853    Subjective Pt says she is wants to get measured for her stockings as soon as she can.  She still feels soreness in her toes    Pertinent History Diagnosis is large B cell lymphoma. She has finished chemotherapy for now. Just had a CAT scan today to check on status. h/o left total hip replacement in 2010. Arthritis, especially in hands.   Patient Stated Goals get help with swelling in the feet   Currently in Pain? Yes   Pain Score 4    Pain Location Foot   Pain Orientation Right;Left   Pain Descriptors / Indicators Sore   Aggravating Factors  worse with moving foot up an down                LYMPHEDEMA/ONCOLOGY QUESTIONNAIRE - 01/24/17 0859      Right Lower Extremity Lymphedema   30 cm Proximal to Floor at Lateral Plantar Foot 26.5 cm   20 cm Proximal to Floor at Lateral Plantar Foot 22.5 1   10  cm Proximal to Floor at Lateral Malleoli 20.8 cm   5 cm Proximal to 1st MTP Joint 20 cm   Across MTP Joint 20 cm   Around Proximal Great Toe 7 cm     Left Lower Extremity Lymphedema   30 cm Proximal to Floor at Lateral Plantar Foot 26.5 cm   20 cm Proximal to Floor at Lateral Plantar Foot 21 cm   10 cm Proximal to Floor at Lateral Malleoli 21 cm   5 cm Proximal to 1st MTP Joint 19.1 cm  Across MTP Joint 21 cm   Around Proximal Great Toe 7 cm                  [facility] Adult PT Treatment/Exercise - 01/24/17 0001      Manual Therapy   Manual Therapy Edema management   Edema Management remeasued legs    Compression Bandaging To Bil LE's: Biotone lotion, thick stockinette, Elastomull to toes 1-3, Artiflex x1, 1-8 cm (combined roman sandal and ASH technique with this bandage) and 1-10 cm short stretch compression bandage from foot to knee. Placed surgical shoe covers over feet as pts shoes would not fit over bandages.                 PT Education - 01/24/17 1229    Education provided Yes   Education Details okay to remove bandages on Sunday, Bring everything back    Person(s) Educated Patient   Methods Explanation   Comprehension Verbalized understanding           Short Term Clinic  Goals - 01/24/17 1231      CC Short Term Goal  #1   Title Pt. will have a reduction of at least 0.5 cm. in circumferences of both legs at 10 cm. proximal to floor at lataeral malleoli   Baseline right 23.5 cm. and left 23.7 cm. at eval, right at 20 and left at 21 cm on 01/24/2017    Status Achieved     CC Short Term Goal  #2   Title reduction of at least 0.5 cm. at 5 cm. proximal to 1st MTP on both feet   Baseline 22.3 cm. on right and 22 cm. on left at eval, right at 20, left at 19.1 on 01/24/2017   Status Achieved             Long Term Clinic Goals - 01/24/17 1232      CC Long Term Goal  #1   Title circumference reduction of at least 1 cm. at 10 cm. proximal to floor at lateral malleoli on both legs   Baseline 23.5 cm. on right and 23.7 cm. on left at eval, 20.8 on right and 21 on left on 01/24/2017   Status Achieved     CC Long Term Goal  #2   Title Pt. will have been fitted for compression knee highs for both legs   Status On-going     CC Long Term Goal  #3   Title Pt. will be knowledgeable about self-care for swelling   Status On-going            Plan - 01/24/17 1221    Clinical Impression Statement Pt has had great reduction with compression bandaging and is ready for measurement for stockings. Gave her a prescription fo take to Dr. Irene Limbo for signiture and she gave the okay to send demographics to [employer] for Medicaid coverage of    PT Next Visit Plan Remeasure to see if pt has been able to maintain reduction without bandaging. Coordinate bandaging with stocking meaurement times    Consulted and Agree with Plan of Care Patient      Patient will benefit from skilled therapeutic intervention in order to improve the following deficits and impairments:  Increased edema, Decreased strength, Impaired flexibility, Difficulty walking  Visit Diagnosis: No diagnosis found.     Problem List Patient Active Problem List   Diagnosis Date Noted  . Mitral valve  prolapse -  severe MR 09/20/2016  . Aspiration  pneumonia due to inhalation of vomitus ([place]) 09/12/2016  . Hypokalemia 09/12/2016  . Counseling regarding advanced care planning and goals of care   . Sepsis ([place]) 09/07/2016  . Nausea and vomiting 09/07/2016  . Ileus ([place]) 09/07/2016  . Severe mitral regurgitation: Per 2 D echo 08/23/2016 08/23/2016  . Diffuse large B-cell lymphoma of lymph nodes of multiple regions ([place]) 08/22/2016  . B-cell lymphoma ([place])   . 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 History of peristent atrial fibrillation 08/20/2016  . Abdominal aortic atherosclerosis ([place]) - noted on CT scan 08/20/2016  . Severe protein-calorie malnutrition ([place])   . Hyponatremia 08/11/2016  . Normocytic normochromic anemia 08/11/2016  . Essential hypertension 08/11/2016  . Constipation 08/11/2016  . Hyperlipidemia 08/11/2016  . Abdominal pain 08/11/2016  . Renal mass, right 08/11/2016  . Adenopathy 08/11/2016  . Other intra-abdominal and pelvic swelling, mass and lump 08/11/2016  . Pain in left hip 06/05/2016   Donato Heinz. Owens Shark PT  Norwood Levo 01/24/2017, 12:33 PM  [place], [place], [address] Phone: [phone]   Fax:  [phone]  Name: Christina Peterson MRN: [id number] Date of Birth: 12-15-1940

## 2017-01-24 NOTE — Progress Notes (Signed)
[name] Kitchen    HEMATOLOGY/ONCOLOGY CLINIC NOTE  Date of Service: 12/23/16  Patient Care Team: Smothers, Andree Elk, NP as PCP - General (Nurse Practitioner)  CHIEF COMPLAINTS/PURPOSE OF CONSULTATION:  Post hospitalization f/u for DLBCL Renal cell carcinoma ?  HISTORY OF PRESENTING ILLNESS:  Christina Peterson is a wonderful 76 y.o. female who has been referred to [place] by Dr Cathlean Sauer, Jimmy Picket,*   for evaluation and management of newly diagnosed Large B cell lymphoma.  Patient has a history of hypertension, dyslipidemia, arthritis but notes that she lives independently in a senior housing facility and does all her ADLs herself.  She notes that she has been having increasing abdominal pain and mid back pain for about 3-4 weeks and has had some increased fatigue over the last couple of months. She notes that she has lost about 7-8 pounds over the last 2-3 weeks. She was recently admitted to the hospital from 08/10/2016-08/15/2016 with abdominal pain and poor oral intake. She was noted to be dehydrated with hyponatremia and CT scan of the abdomen on 08/12/2016 showed lymphadenopathy within the chest and abdomen concerning for lymphoma. She also was noted to have a right renal mass in the lower pole of the right kidney measuring 3.6 cm concerning for renal cell carcinoma.  Patient had a CT-guided biopsy of her retroperitoneal lymph node on 08/14/2016 which shows large B-cell lymphoma. She was to be seen in the oncology clinic but got readmitted on 08/21/2016 with worsening abdominal pain nausea and decreased appetite.  She was noted to be significantly hyponatremic again and is currently on IV fluids. She notes her abdominal pain is better controlled with pain medications she is receiving. No fevers or chills. I was consulted to help with further evaluation and management of her newly diagnosed large B-cell lymphoma.  Patient notes abdominal and back pain and about an 8-10 pound weight loss in the last few  weeks and inability to keep much food down due to pain and anorexia. She has been living by herself and demonstrated failure to thrive.  We discussed the diagnosis, natural history, prognosis and treatment options in detail including additional workup including a PET scan, need for a Port-A-Cath and different chemotherapy options possible adverse effects and limitations 2 doses of chemotherapy that can be used as a result of her age. Patient understands and is able to repeat back the understanding of these elements of care and would like to proceed with the most appropriate treatment.  She received her first cycle of R-mini CHOP on 08/26/2016 and had outpatient Neulasta shot since then.  INTERVAL HISTORY  Patient is here for her followup and to discuss the results of her recent PET scan. Overall she has been doing alright overall. She reports that she has had some good days and some bad days. Her appetite has somewhat improved, but not much. She notes that she has interval abdominal pain and has continued to be fatigued. Her leg swelling which was previously noted has been improving. She reports that she has gained approximately 1-3lbs and she has been able to eat somewhat.   MEDICAL HISTORY:  Past Medical History:  Diagnosis Date  . Arthritis   . Diffuse large B-cell lymphoma of lymph nodes of multiple regions ([place]) 08/22/2016  . Hypercholesteremia   . Hypertension    FOLLOWED BY DR Katherine Roan  . Mitral valve prolapse -  severe MR 09/20/2016   Posterior Leaflet  . Severe mitral regurgitation: Per 2 D echo 08/23/2016 08/23/2016   .  posterior leaflet prolapse of mitral valve with anteriorly directed mitral regurgitation. Severe. Severely dilated left atrium.  . Severe protein-calorie malnutrition ([place])     SURGICAL HISTORY: Past Surgical History:  Procedure Laterality Date  . ABDOMINAL HYSTERECTOMY    . IR FLUORO GUIDE PORT INSERTION RIGHT  08/23/2016  . IR US GUIDE VASC ACCESS RIGHT   08/23/2016  . JOINT REPLACEMENT    . 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 TOTAL HIP REVISION  05/02/2011   Procedure: TOTAL HIP REVISION;  Surgeon: Sharmon Revere, MD;  Location: [facility];  Service: Orthopedics;  Laterality: Left;  left total hip revision with bone grafting  . TRANSTHORACIC ECHOCARDIOGRAM  08/23/2013    EF 60-65%. GR 2 DD.posterior leaflet prolapse of mitral valve with anteriorly directed mitral regurgitation. Severe. Severely dilated left atrium.    SOCIAL HISTORY: Social History   Social History  . Marital status: Legally Separated    Spouse name: N/A  . Number of children: 4  . Years of education: 12   Occupational History  . Not on file.   Social History Main Topics  . Smoking status: Never Smoker  . Smokeless tobacco: Never Used  . Alcohol use No  . Drug use: No  . Sexual activity: Not on file   Other Topics Concern  . Not on file   Social History Narrative   She lives alone in an apartment.  She relies on family members for transportation & helping her figure out her medications.   She has listed married, but reportedly she is legally separated.   They have 4 children and 5 grandchildren and 5 great-grandchildren.   Has Social Security & DSS assistance with food stamps & food banks.      FAMILY HISTORY: Family History  Problem Relation Age of Onset  . Heart disease Mother   . Hypertension Mother   . Heart attack Mother 72  . Hypertension Father   . Brain cancer Father   . Brain cancer Sister   . Hypertension Sister   . Stroke Sister   . Alcohol abuse Brother   . Depression Daughter   . Hypertension Daughter   . Thyroid disease Daughter   . Migraines Daughter     ALLERGIES:  has No Known Allergies.  MEDICATIONS:  Current Outpatient Prescriptions  Medication Sig Dispense Refill  . azithromycin (ZITHROMAX) 250 MG tablet 2 tab (500mg  )x 1 then 1 tab (250mg ) po daily x 4 days (Patient not taking: Reported on 01/15/2017) 6 each 0  .  dexamethasone (DECADRON) 2 MG tablet Take 1 tablet (2 mg total) by mouth daily with breakfast. 30 tablet 0  . docusate sodium (COLACE) 100 MG capsule Take 1 capsule (100 mg total) by mouth every 12 (twelve) hours. (Patient not taking: Reported on 01/15/2017) 60 capsule 1  . dronabinol (MARINOL) 2.5 MG capsule Take 1 capsule (2.5 mg total) by mouth 2 (two) times daily before a meal. 60 capsule 0  . feeding supplement, ENSURE ENLIVE, (ENSURE ENLIVE) LIQD Take 237 mLs by mouth 2 (two) times daily between meals. (Patient not taking: Reported on 01/15/2017) 4500 mL 3  . hydrOXYzine (ATARAX/VISTARIL) 25 MG tablet Take 25 mg by mouth 3 (three) times daily as needed.    . lidocaine-prilocaine (EMLA) cream Apply to port 1-2 hours before needle access. Cover with plastic wrap. 30 g 1  . magnesium oxide (MAG-OX) 400 (241.3 Mg) MG tablet Take 1 tablet (400 mg total) by mouth 2 (  two) times daily. 60 tablet 0  . ondansetron (ZOFRAN) 8 MG tablet Take 1 tablet (8 mg total) by mouth every 8 (eight) hours as needed for nausea or vomiting. 30 tablet 0  . oxyCODONE (OXY IR/ROXICODONE) 5 MG immediate release tablet Take 1-2 tablets (5-10 mg total) by mouth every 4 (four) hours as needed for severe pain or breakthrough pain. 60 tablet 0  . pantoprazole (PROTONIX) 20 MG tablet Take 1 tablet (20 mg total) by mouth daily. (Patient not taking: Reported on 01/15/2017) 30 tablet 0  . polyethylene glycol (MIRALAX / GLYCOLAX) packet Take 17 g by mouth daily. 28 each 1  . potassium chloride SA (K-DUR,KLOR-CON) 20 MEQ tablet Take 1 tablet (20 mEq total) by mouth 2 (two) times daily. 10 tablet 0  . predniSONE (DELTASONE) 20 MG tablet Take 3 tablets (60 mg total) by mouth See admin instructions. 60 mg po daily Day 1 to Day 5 of each chemotherapy cycle (Patient not taking: Reported on 01/15/2017) 30 tablet 3  . prochlorperazine (COMPAZINE) 10 MG tablet Take 1 tablet (10 mg total) by mouth every 6 (six) hours as needed for nausea or vomiting.  30 tablet 0  . senna-docusate (SENNA S) 8.6-50 MG tablet Take 2 tablets by mouth 2 (two) times daily. Reduce to 2tab po HS if diarrhea occurs (Patient not taking: Reported on 01/15/2017) 60 tablet 2  . sucralfate (CARAFATE) 1 g tablet Take 1 tablet (1 g total) by mouth 4 (four) times daily. 30 tablet 0   No current facility-administered medications for this visit.    Facility-Administered Medications Ordered in Other Visits  Medication Dose Route Frequency Provider Last Rate Last Dose  . ondansetron (ZOFRAN-ODT) disintegrating tablet 8 mg  8 mg Oral Once Brunetta Genera, MD        REVIEW OF SYSTEMS:    10 Point review of Systems was done is negative except as noted above.  PHYSICAL EXAMINATION:  ECOG PERFORMANCE STATUS: 2 - Symptomatic, <50% confined to bed Vital signs reviewed in Epic . Wt Readings from Last 3 Encounters:  01/24/17 90 lb 11.2 oz (41.1 kg)  12/03/16 94 lb (42.6 kg)  12/02/16 94 lb 3.2 oz (42.7 kg)   GENERAL:alert, in no acute distress and comfortable. Cachetic appearing. SKIN: no acute rashes, no significant lesions EYES: conjunctiva are pink and non-injected, sclera anicteric OROPHARYNX: MMM, no exudates, no oropharyngeal erythema or ulceration NECK: supple, no JVD LYMPH:  no palpable cervical or axillary LNadenopathy. LUNGS: clear to auscultation b/l with normal respiratory effort HEART: regular rate & rhythm, 3/6 systolic murmur over the sternum and aortic area  ABDOMEN:  normoactive bowel sounds , non tender, not distended. Extremity: no pedal edema PSYCH: alert & oriented x 3 with fluent speech NEURO: no focal motor/sensory deficits   LABORATORY DATA:  I have reviewed the data as listed. CBC Latest Ref Rng & Units 01/24/2017 12/23/2016 12/02/2016  WBC 3.9 - 10.3 10e3/uL 7.2 12.4(H) 8.9  Hemoglobin 11.6 - 15.9 g/dL 7.8 repeat both analyzers(L) 9.5(L) 9.2(L)  Hematocrit 34.8 - 46.6 % 25.1(L) 29.7(L) 29.7(L)  Platelets 145 - 400 10e3/uL 319 500(H)  437(H)   . CBC    Component Value Date/Time   WBC 7.2 01/24/2017 1257   WBC 20.9 (H) 09/12/2016 0333   RBC 2.98 (L) 01/24/2017 1257   RBC 3.39 (L) 09/12/2016 0333   HGB 7.8 repeat both analyzers (L) 01/24/2017 1257   HCT 25.1 (L) 01/24/2017 1257   PLT 319 01/24/2017 1257   MCV  84.3 01/24/2017 1257   MCH 26.2 01/24/2017 1257   MCH 28.3 09/12/2016 0333   MCHC 31.1 (L) 01/24/2017 1257   MCHC 32.7 09/12/2016 0333   RDW 22.1 (H) 01/24/2017 1257   LYMPHSABS 0.5 (L) 01/24/2017 1257   MONOABS 1.1 (H) 01/24/2017 1257   EOSABS 0.1 01/24/2017 1257   BASOSABS 0.0 01/24/2017 1257    . CMP Latest Ref Rng & Units 01/24/2017 12/23/2016 12/02/2016  Glucose 70 - 140 mg/dl 87 90 92  BUN 7.0 - 26.0 mg/dL 9.6 6.4(L) 7.0  Creatinine 0.6 - 1.1 mg/dL 0.7 0.7 0.7  Sodium 136 - 145 mEq/L 139 137 138  Potassium 3.5 - 5.1 mEq/L 4.6 4.3 4.4  Chloride 101 - 111 mmol/L - - -  CO2 22 - 29 mEq/L 27 27 29   Calcium 8.4 - 10.4 mg/dL 9.6 10.1 9.8  Total Protein 6.4 - 8.3 g/dL 5.8(L) 6.2(L) 5.9(L)  Total Bilirubin 0.20 - 1.20 mg/dL 0.35 0.53 0.42  Alkaline Phos 40 - 150 U/L 66 82 99  AST 5 - 34 U/L 17 18 17   ALT 0-55 U/L U/L 6 7 12      ------------------------------------------------------------------- LV EF: 60% - 65%  ------------------------------------------------------------------- Indications: (Chemo I74.9).  ------------------------------------------------------------------- History: PMH: No prior cardiac history. Risk factors: Dyslipidemia.  ------------------------------------------------------------------- Study Conclusions  - Left ventricle: The cavity size was normal. Wall thickness was normal. Systolic function was normal. The estimated ejection fraction was in the range of 60% to 65%. Features are consistent with a pseudonormal left ventricular filling pattern, with concomitant abnormal relaxation and increased filling pressure (grade 2 diastolic  dysfunction). Doppler parameters are consistent with high ventricular filling pressure. - Aortic valve: There was trivial regurgitation. - Mitral valve: Prolapse of the posterior milral leaflet MR is anteriorly directed and wraps aound wall of LA MR is severe. - Left atrium: The atrium was severely dilated. - Right atrium: The atrium was mildly to moderately dilated. - Pulmonary arteries: PA peak pressure: 49 mm Hg (S).  RADIOGRAPHIC STUDIES: I have personally reviewed the radiological images as listed and agreed with the findings in the report. .Nm Pet Image Restag (ps) Skull Base To Thigh  Result Date: 01/15/2017 CLINICAL DATA:  Subsequent treatment strategy for diffuse large B-cell lymphoma. EXAM: NUCLEAR MEDICINE PET SKULL BASE TO THIGH TECHNIQUE: 5.0 mCi F-18 FDG was injected intravenously. Full-ring PET imaging was performed from the skull base to thigh after the radiotracer. CT data was obtained and used for attenuation correction and anatomic localization. FASTING BLOOD GLUCOSE:  Value: 89 mg/dl COMPARISON:  Multiple exams, including PET-CT dated 11/04/2016 FINDINGS: NECK No hypermetabolic lymph nodes in the neck. Hypoplastic sinuses. CHEST Left supraclavicular node measures 1.0 cm in short axis (formerly 0.8 cm) with maximum SUV 40.8 (formerly 37.6). Right Port-A-Cath tip:  Cavoatrial junction.  Mild cardiomegaly. ABDOMEN/PELVIS Enlarging mesenteric and retroperitoneal nodal masses are identified. Index left periaortic lymph node 3.6 cm in short axis on image 109/4 (formerly 2.0 cm) with maximum SUV 41.9 (formerly 57.7). Mildly hypermetabolic right kidney lower pole exophytic mass measuring 3.5 cm in diameter on image 107/4, essentially stable from 08/12/2016, appearance likely reflecting renal cell carcinoma. Mild hypodensity along the falciform ligament, without hypermetabolic activity, likely from focal fatty infiltration. Mild left hydronephrosis and hydroureter probably from extrinsic  mass effect from the retroperitoneal adenopathy on the ureter. The distal ureter does not appear significantly expanded. There scattered pelvic phleboliths. Small amount of free pelvic fluid. Pelvic floor laxity, questionable mild anal prolapse. Mild mesenteric edema. Diffuse mild  subcutaneous edema. SKELETON Notable arthropathy in various locations, for example with calcifications in both glenohumeral joints and degenerative findings; sclerosis and some erosive arthropathy along with chondrocalcinosis at the sternoclavicular joints right greater than left; bilateral sacroiliac joint arthropathy; chondrocalcinosis, spurring, and irregularity of the pubic articulations; and right hip arthropathy. Left total hip prosthesis. The previous generalized marrow uptake seen on PET-CT has resolved. No current compelling findings of active malignancy in the visualized skeleton. IMPRESSION: 1. Enlarging retroperitoneal and mesenteric adenopathy in the abdomen, with persistent very high metabolic activity. Mild enlargement of the highly hypermetabolic left supraclavicular node, which is the only extra-abdominal manifestation of malignancy. 2. Stable mildly hypermetabolic right kidney lower pole mass measuring 3.5 cm, compatible with a renal cell carcinoma. 3. Mild left hydronephrosis and proximal hydroureter thought to be due to extrinsic narrowing of the ureter related to the left periaortic masses. 4. Other imaging findings of potential clinical significance: Mild cardiomegaly. Small amount of free pelvic fluid. Pelvic floor laxity, questionable mild anal prolapse. Mesenteric and subcutaneous edema, query third spacing of fluid. Notable arthropathy of large joints with chondrocalcinosis as detailed above. Electronically Signed   By: Van Clines M.D.   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 Patient has  extensive retroperitoneal mesenteric and periportal lymphadenopathy along with adenopathy in the chest and supraclavicular areas and a focal lesion in the spleen.   Patient received first cycle of R-mini CHOP on 08/26/2016 and received outpatient neulasta. Patient received 2nd  cycle of R-mini CHOP on 09/26/2016 and received outpatient neulasta. Received third cycle of R-mini CHOP on 09/26/2016 and received outpatient neulasta. Received third cycle of R-mini CHOP on 10/21/2016 and received outpatient neulasta. PET scan on 11/04/2016 which shows treatment response but still with fairly active disease. C4 -anthracycline dose (doxorubicin 50 mg/m) to standard CHOP dosing and continue with dose reduced cyclophosphamide.  C5 of R-CHOP with some dose as cycle 4.  Plan -labs were not drawn before clinic today. We will have them draw before she leaves. Weight is fairly stable I went through her PET scan in detail following her first line of R-mini CHOP chemotherapy. Her PET scan did show some shrinkage,  however, there was still evidence of a fair amount of disease within the abdomen. -Additionally, there was some nodal activity within lower left neck as well.  -I informed the patient and her family that we could opt for control therapies with radiation oncology or we can opt for possibly moving to comfort/palliative care. -I also informed them that were could attempt a second line of chemotherapy, but this would not be beneficial for her as she was only able to tolerate her first line with breaks and reduced dosages.  -I also discussed with them how the PET scan did visualize a small tumor within the left kidney, this has remained mostly unchanged from prior but this may be lymphoma activity vs additional renal cancer.  -she is agreeable and has been referred to radiation oncology to consider palliative ISRT to her intra-abdominal lymphoma.  #2 Hypomagnesemia Mg 1.4 previously Plan -magnesium replacement  ordered - 1 g. Continue by mouth magnesium oxide-  #3 Right lower pole of the kidney lesion ? Renal cell carcinoma versus involvement by lymphoma .  #4 significant fatigue and failure to thrive due to poor oral intake related lymphoma and type B constitutional symptoms . Plan -  Continue to follow with [name]/dietitian to address her nutritional requirements. --Advised patient to continue nutrition supplement Boost/ensure -Will refill compazine. - I will give the patient a referral for home PT -Will give a referral for home health nurse.    #5 neoplasm related pain currently well-controlled  Plan -Oxycodone 5-10 mg every 4 hours as needed for breakthrough pain  #6 severe mitral regurgitation normal ejection fraction -f/u with cardiology   #7 SYmptomatic Anemia -will schedule for 2 units of PRBCs  Labs today Rad Onc urgent consult for palliative ISRT to residual diffuse large B cell lymphoma in the abdomen Setup for 2 units of PRBCs RTC w/ Dr Irene Limbo in 4wk w/ lab   All of the patients questions were answered with apparent satisfaction. The patient knows to call the clinic with any problems, questions or concerns.  I spent 30 minutes counseling the patient face to face. The total time spent in the appointment was 40 minutes and more than 50% was on counseling and direct patient cares.    Sullivan Lone MD [place] AAHIVMS [facility] Hematology/Oncology Physician [facility]  (Office):       [phone] (Work cell):  [phone] (Fax):           [phone]  This document serves as a record of services personally performed by Sullivan Lone, MD. It was created on his behalf by Reola Mosher, a trained medical scribe. The creation of this record is based on the scribe's personal observations and the provider's statements to them. This document has been checked and approved by the attending provider.

## 2017-01-24 NOTE — Telephone Encounter (Signed)
Printed patient avs and calender for upcoming appointment

## 2017-01-27 ENCOUNTER — Ambulatory Visit (HOSPITAL_BASED_OUTPATIENT_CLINIC_OR_DEPARTMENT_OTHER): Payer: Medicare Other

## 2017-01-27 ENCOUNTER — Ambulatory Visit (HOSPITAL_COMMUNITY)
Admission: RE | Admit: 2017-01-27 | Discharge: 2017-01-27 | Disposition: A | Discharge status: Home / Self Care | Payer: Medicare Other | Source: Ambulatory Visit | Attending: Hematology | Admitting: Hematology

## 2017-01-27 ENCOUNTER — Telehealth: Payer: Self-pay | Admitting: *Deleted

## 2017-01-27 ENCOUNTER — Other Ambulatory Visit: Payer: Self-pay | Admitting: *Deleted

## 2017-01-27 DIAGNOSIS — C8338 Diffuse large B-cell lymphoma, lymph nodes of multiple sites: Secondary | ICD-10-CM

## 2017-01-27 DIAGNOSIS — C8330 Diffuse large B-cell lymphoma, unspecified site: Secondary | ICD-10-CM | POA: Diagnosis present

## 2017-01-27 LAB — CBC WITH DIFFERENTIAL/PLATELET
BASO%: 0.9 % (ref 0.0–2.0)
Basophils Absolute: 0.1 10*3/uL (ref 0.0–0.1)
EOS ABS: 0 10*3/uL (ref 0.0–0.5)
EOS%: 0.3 % (ref 0.0–7.0)
HCT: 24.6 % — ABNORMAL LOW (ref 34.8–46.6)
HGB: 7.9 g/dL — ABNORMAL LOW (ref 11.6–15.9)
LYMPH%: 4.4 % — AB (ref 14.0–49.7)
MCH: 26.7 pg (ref 25.1–34.0)
MCHC: 32.2 g/dL (ref 31.5–36.0)
MCV: 83.1 fL (ref 79.5–101.0)
MONO#: 2.3 10*3/uL — AB (ref 0.1–0.9)
MONO%: 21.7 % — ABNORMAL HIGH (ref 0.0–14.0)
NEUT%: 72.7 % (ref 38.4–76.8)
NEUTROS ABS: 7.6 10*3/uL — AB (ref 1.5–6.5)
NRBC: 0 % (ref 0–0)
PLATELETS: 293 10*3/uL (ref 145–400)
RBC: 2.96 10*6/uL — ABNORMAL LOW (ref 3.70–5.45)
RDW: 22.1 % — ABNORMAL HIGH (ref 11.2–14.5)
WBC: 10.5 10*3/uL — AB (ref 3.9–10.3)
lymph#: 0.5 10*3/uL — ABNORMAL LOW (ref 0.9–3.3)

## 2017-01-27 LAB — PREPARE RBC (CROSSMATCH)

## 2017-01-27 LAB — ABO/RH: ABO/RH(D): O POS

## 2017-01-27 MED ORDER — SODIUM CHLORIDE 0.9 % IV SOLN
250.0000 mL | Freq: Once | INTRAVENOUS | Status: AC
  Administered 2017-01-27: 250 mL via INTRAVENOUS

## 2017-01-27 MED ORDER — OXYCODONE HCL 5 MG PO TABS: 5.0000 mg | ORAL_TABLET | ORAL | 0 refills | Status: DC | PRN

## 2017-01-27 MED ORDER — ACETAMINOPHEN 325 MG PO TABS
ORAL_TABLET | ORAL | Status: AC
  Filled 2017-01-27: qty 2

## 2017-01-27 MED ORDER — DIPHENHYDRAMINE HCL 25 MG PO CAPS
ORAL_CAPSULE | ORAL | Status: AC
  Filled 2017-01-27: qty 1

## 2017-01-27 MED ORDER — DIPHENHYDRAMINE HCL 25 MG PO CAPS
25.0000 mg | ORAL_CAPSULE | Freq: Once | ORAL | Status: AC
  Administered 2017-01-27: 25 mg via ORAL

## 2017-01-27 MED ORDER — ACETAMINOPHEN 325 MG PO TABS
650.0000 mg | ORAL_TABLET | Freq: Once | ORAL | Status: AC
  Administered 2017-01-27: 650 mg via ORAL

## 2017-01-27 NOTE — Patient Instructions (Signed)

## 2017-01-27 NOTE — Telephone Encounter (Signed)
Per Dr. Irene Limbo, called pt to review lab results and set up for blood transfusion.  No answer on pt's home line.  Called pt's emergency contact Gloria and LVM to return call asap to review lab results.  Call back number provided.    Called pt home number again, was able to contact pt.  Pt will get in touch with daughter for transportation and attempt to come this afternoon.  Pt will call back if there are any issues.

## 2017-01-27 NOTE — Telephone Encounter (Signed)
-----   Message from Brunetta Genera, MD sent at 01/27/2017  2:27 AM EDT ----- Delle Reining, Will need to let patient know her hgb is down to 7.8 and that we would recommend 2Units of PRBC. Plz setup and order if patient agreeable. Thanks [place]

## 2017-01-27 NOTE — Progress Notes (Signed)
Lymphoma Location(s) / Histology: Stage III diffuse large B-Cell lymphoma,  Christina Peterson presented  months ago with symptoms of: increasing abdominal pain and poor intake,nausea  Biopsies of  (if applicable) revealed:Diagnosis 08/14/2016: Lymph node, needle/core biopsy, retroperitoneal - LARGE B CELL LYMPHOMA.   Interpretation 08/14/2016: Tissue-Flow Cytometry - MONOCLONAL B CELL POPULATION IDENTIFIED. Diagnosis Comment: The findings are consistent with non-Hodgkin's B cell lymphoma. (BNS:gt, 08/16/16)   Past/Anticipated interventions by medical oncology, if any: Dr. Irene Limbo, MD 01/24/17 note,:01/27/2017  2:27 AM EDT , R-mini Chop 08/26/16 neulasta out patient since then,-----   Will need to let patient know her hgb is down to 7.8 and that we would recommend 2Units of PRBC. Plz setup and order Patient notified will try and get daughter to bring her this afternoon. Rad Onc urgent consult for palliative SBRT to residual diffuse large B cell lymphoma in the abdomen Setup for 2 units of PRBCs.-Continue to follow with [name]/dietitian to address her nutritional requirements. --Advised patient to continue nutrition supplement Boost/ensure -Will refill compazine. - I will give the patient a referral for home PT -Will give a referral for home health nurse.     Weight changes, if any, over the past 6 months: 8-10 weight loss, but has resent gained 1-3 lbs,takes decadron 2mg  daily with breakfast ,Marinol 2x day before meal  Recurrent fevers, or drenching night sweats, if any: NO   SAFETY ISSUES:Yes, FTT  Prior radiation? NO  Pacemaker/ICD? NO  Is the patient on methotrexate? NO  Current Complaints / other details: Legally separated, 4 children,   Lives alone senior housing facility,  Severe mitral valve prolapse, mitral regurgitation, Mother, HTN,MI, heart disease, Father-HTN,Brain cancer, , Sister brain cancer, HTN,Stroke,   Allergies:NKA   BP 123/76   Pulse 76   Temp 98.1 F (36.7  C) (Oral)   Resp 20   Ht 4\' 11"  (1.499 m)   Wt 93 lb 3.2 oz (42.3 kg)   SpO2 100% Comment: roo air  BMI 18.82 kg/m   Wt Readings from Last 3 Encounters:  01/28/17 93 lb 3.2 oz (42.3 kg)  01/24/17 90 lb 11.2 oz (41.1 kg)  12/03/16 94 lb (42.6 kg)

## 2017-01-28 ENCOUNTER — Encounter: Payer: Self-pay | Admitting: Radiation Oncology

## 2017-01-28 ENCOUNTER — Ambulatory Visit
Admission: RE | Admit: 2017-01-28 | Discharge: 2017-01-28 | Disposition: A | Discharge status: Home / Self Care | Payer: Medicare Other | Source: Ambulatory Visit | Attending: Radiation Oncology | Admitting: Radiation Oncology

## 2017-01-28 ENCOUNTER — Other Ambulatory Visit (HOSPITAL_COMMUNITY): Payer: Self-pay | Admitting: Family

## 2017-01-28 VITALS — BP 123/76 | HR 76 | Temp 98.1°F | Resp 20 | Ht 59.0 in | Wt 93.2 lb

## 2017-01-28 DIAGNOSIS — Z90710 Acquired absence of both cervix and uterus: Secondary | ICD-10-CM | POA: Insufficient documentation

## 2017-01-28 DIAGNOSIS — I340 Nonrheumatic mitral (valve) insufficiency: Secondary | ICD-10-CM | POA: Insufficient documentation

## 2017-01-28 DIAGNOSIS — Z79899 Other long term (current) drug therapy: Secondary | ICD-10-CM | POA: Insufficient documentation

## 2017-01-28 DIAGNOSIS — C8338 Diffuse large B-cell lymphoma, lymph nodes of multiple sites: Secondary | ICD-10-CM

## 2017-01-28 DIAGNOSIS — Z96642 Presence of left artificial hip joint: Secondary | ICD-10-CM | POA: Insufficient documentation

## 2017-01-28 DIAGNOSIS — Z808 Family history of malignant neoplasm of other organs or systems: Secondary | ICD-10-CM | POA: Insufficient documentation

## 2017-01-28 DIAGNOSIS — M1990 Unspecified osteoarthritis, unspecified site: Secondary | ICD-10-CM | POA: Diagnosis not present

## 2017-01-28 DIAGNOSIS — Z818 Family history of other mental and behavioral disorders: Secondary | ICD-10-CM | POA: Diagnosis not present

## 2017-01-28 DIAGNOSIS — I10 Essential (primary) hypertension: Secondary | ICD-10-CM | POA: Diagnosis not present

## 2017-01-28 DIAGNOSIS — I341 Nonrheumatic mitral (valve) prolapse: Secondary | ICD-10-CM | POA: Diagnosis not present

## 2017-01-28 DIAGNOSIS — Z811 Family history of alcohol abuse and dependence: Secondary | ICD-10-CM | POA: Diagnosis not present

## 2017-01-28 DIAGNOSIS — Z8249 Family history of ischemic heart disease and other diseases of the circulatory system: Secondary | ICD-10-CM | POA: Diagnosis not present

## 2017-01-28 DIAGNOSIS — Z823 Family history of stroke: Secondary | ICD-10-CM | POA: Insufficient documentation

## 2017-01-28 DIAGNOSIS — Z8349 Family history of other endocrine, nutritional and metabolic diseases: Secondary | ICD-10-CM | POA: Insufficient documentation

## 2017-01-28 DIAGNOSIS — E7800 Pure hypercholesterolemia, unspecified: Secondary | ICD-10-CM | POA: Insufficient documentation

## 2017-01-28 DIAGNOSIS — Z510 Encounter for antineoplastic radiation therapy: Secondary | ICD-10-CM | POA: Insufficient documentation

## 2017-01-28 DIAGNOSIS — N2889 Other specified disorders of kidney and ureter: Secondary | ICD-10-CM | POA: Diagnosis not present

## 2017-01-28 DIAGNOSIS — R1310 Dysphagia, unspecified: Secondary | ICD-10-CM

## 2017-01-28 LAB — BPAM RBC
BLOOD PRODUCT EXPIRATION DATE: 201811072359
BLOOD PRODUCT EXPIRATION DATE: 201811072359
ISSUE DATE / TIME: 201810151416
ISSUE DATE / TIME: 201810151416
UNIT TYPE AND RH: 5100
UNIT TYPE AND RH: 5100

## 2017-01-28 LAB — TYPE AND SCREEN
ABO/RH(D): O POS
ANTIBODY SCREEN: NEGATIVE
UNIT DIVISION: 0
Unit division: 0

## 2017-01-28 NOTE — Progress Notes (Signed)
Radiation Oncology         ([phone]) [phone] ________________________________  Name: Christina Peterson        MRN: [id number]  Date of Service: 01/28/2017 DOB: 28-Jun-1940  GX:[id number], Andree Elk, NP  Brunetta Genera, MD     REFERRING PHYSICIAN: Brunetta Genera, MD   DIAGNOSIS: The encounter diagnosis was Diffuse large B-cell lymphoma of lymph nodes of multiple regions [facility]).   HISTORY OF PRESENT ILLNESS: Christina Peterson is a 76 y.o. female seen at the request of Dr. Irene Limbo for Stage III diffuse large B-Cell lymphoma. She initially presented  in May 2018 with increasing abdominal pain and mid-back pain that had been occurring for about 3-4 weeks and increased fatigue that had been occurring for a couple months prior. She also noted a 7-8 pound weight loss. Prior to seeing Dr. Irene Limbo, she was admitted to the hospital from 08/10/2016-08/15/2016 with abdominal pain and poor oral intake. She was noted to be dehydrated with hyponatremia, and CT scan of the abdomen on 08/12/2016 showed lymphadenopathy within the chest and abdomen concerning for lymphoma. She also was noted to have a right renal mass in the lower pole of the right kidney measuring 3.6 cm concerning for renal cell carcinoma.  She had a CT-guided biopsy of her retroperitoneal lymph node on 08/14/2016 which showed large B-cell lymphoma. She began chemotherapy under the care of Dr. Irene Limbo with 6 cycles of R-mini CHOP. Recent PET scan showed some response to treatment, however, there was still evidence of disease within the abdomen, as well as some concerning nodal activity in the lower left neck. The right renal mass has remained unchanged and may be lymphoma involvement. The patient opted for radiotherapy and has been referred today to discuss palliative radiotherapy to her persistent adenopathy. She is accompanied by her daughter. She is currently on a Decadron regimen and meets with a dietitian to address her nutritional  requirements.   PREVIOUS RADIATION THERAPY: No   PAST MEDICAL HISTORY:  Past Medical History:  Diagnosis Date  . Arthritis   . Diffuse large B-cell lymphoma of lymph nodes of multiple regions ([place]) 08/22/2016  . Hypercholesteremia   . Hypertension    FOLLOWED BY DR Katherine Roan  . Mitral valve prolapse -  severe MR 09/20/2016   Posterior Leaflet  . Severe mitral regurgitation: Per 2 D echo 08/23/2016 08/23/2016   .posterior leaflet prolapse of mitral valve with anteriorly directed mitral regurgitation. Severe. Severely dilated left atrium.  . Severe protein-calorie malnutrition ([place])        PAST SURGICAL HISTORY: Past Surgical History:  Procedure Laterality Date  . ABDOMINAL HYSTERECTOMY    . IR FLUORO GUIDE PORT INSERTION RIGHT  08/23/2016  . IR US GUIDE VASC ACCESS RIGHT  08/23/2016  . JOINT REPLACEMENT    . 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 TOTAL HIP REVISION  05/02/2011   Procedure: TOTAL HIP REVISION;  Surgeon: Sharmon Revere, MD;  Location: [facility];  Service: Orthopedics;  Laterality: Left;  left total hip revision with bone grafting  . TRANSTHORACIC ECHOCARDIOGRAM  08/23/2013    EF 60-65%. GR 2 DD.posterior leaflet prolapse of mitral valve with anteriorly directed mitral regurgitation. Severe. Severely dilated left atrium.     FAMILY HISTORY:  Family History  Problem Relation Age of Onset  . Heart disease Mother   . Hypertension Mother   . Heart attack Mother 60  . Hypertension Father   . Brain cancer Father   .  Brain cancer Sister   . Hypertension Sister   . Stroke Sister   . Alcohol abuse Brother   . Depression Daughter   . Hypertension Daughter   . Thyroid disease Daughter   . Migraines Daughter      SOCIAL HISTORY:  reports that she has never smoked. She has never used smokeless tobacco. She reports that she does not drink alcohol or use drugs. She is legally separated and lives alone in a senior housing facility. She has 4 children and 6  grandchildren.    ALLERGIES: Patient has no known allergies.   MEDICATIONS:  Current Outpatient Prescriptions  Medication Sig Dispense Refill  . dexamethasone (DECADRON) 2 MG tablet Take 1 tablet (2 mg total) by mouth daily with breakfast. 30 tablet 0  . dronabinol (MARINOL) 2.5 MG capsule Take 1 capsule (2.5 mg total) by mouth 2 (two) times daily before a meal. 60 capsule 0  . lidocaine-prilocaine (EMLA) cream Apply to port 1-2 hours before needle access. Cover with plastic wrap. 30 g 1  . magnesium oxide (MAG-OX) 400 (241.3 Mg) MG tablet Take 1 tablet (400 mg total) by mouth 2 (two) times daily. 60 tablet 0  . ondansetron (ZOFRAN) 8 MG tablet Take 1 tablet (8 mg total) by mouth every 8 (eight) hours as needed for nausea or vomiting. 30 tablet 0  . oxyCODONE (OXY IR/ROXICODONE) 5 MG immediate release tablet Take 1-2 tablets (5-10 mg total) by mouth every 4 (four) hours as needed for severe pain or breakthrough pain. 60 tablet 0  . potassium chloride SA (K-DUR,KLOR-CON) 20 MEQ tablet Take 1 tablet (20 mEq total) by mouth 2 (two) times daily. 10 tablet 0  . prochlorperazine (COMPAZINE) 10 MG tablet Take 1 tablet (10 mg total) by mouth every 6 (six) hours as needed for nausea or vomiting. 30 tablet 0  . sucralfate (CARAFATE) 1 g tablet Take 1 tablet (1 g total) by mouth 4 (four) times daily. 30 tablet 0  . docusate sodium (COLACE) 100 MG capsule Take 1 capsule (100 mg total) by mouth every 12 (twelve) hours. (Patient not taking: Reported on 01/15/2017) 60 capsule 1  . feeding supplement, ENSURE ENLIVE, (ENSURE ENLIVE) LIQD Take 237 mLs by mouth 2 (two) times daily between meals. (Patient not taking: Reported on 01/15/2017) 4500 mL 3  . hydrOXYzine (ATARAX/VISTARIL) 25 MG tablet Take 25 mg by mouth 3 (three) times daily as needed.    . pantoprazole (PROTONIX) 20 MG tablet Take 1 tablet (20 mg total) by mouth daily. (Patient not taking: Reported on 01/15/2017) 30 tablet 0  . polyethylene glycol  (MIRALAX / GLYCOLAX) packet Take 17 g by mouth daily. (Patient not taking: Reported on 01/28/2017) 28 each 1  . predniSONE (DELTASONE) 20 MG tablet Take 3 tablets (60 mg total) by mouth See admin instructions. 60 mg po daily Day 1 to Day 5 of each chemotherapy cycle (Patient not taking: Reported on 01/15/2017) 30 tablet 3  . senna-docusate (SENNA S) 8.6-50 MG tablet Take 2 tablets by mouth 2 (two) times daily. Reduce to 2tab po HS if diarrhea occurs (Patient not taking: Reported on 01/15/2017) 60 tablet 2   No current facility-administered medications for this encounter.    Facility-Administered Medications Ordered in Other Encounters  Medication Dose Route Frequency Provider Last Rate Last Dose  . ondansetron (ZOFRAN-ODT) disintegrating tablet 8 mg  8 mg Oral Once Brunetta Genera, MD         REVIEW OF SYSTEMS: On review of systems, the  patient reports that she is doing well overall. She denies any chest pain, shortness of breath, cough, fevers, chills, or night sweats. She reports an 8-10 unintended weight loss over the past 6 months but has recently regained 1-3 pounds. She reports swelling in her feet. She denies any bowel or bladder disturbances, and denies nausea or vomiting. She reports intermittent abdominal pain, sometimes exacerbated by consuming dairy. She denies any new musculoskeletal or joint aches or pains. A complete review of systems is obtained and is otherwise negative.     PHYSICAL EXAM:  Wt Readings from Last 3 Encounters:  01/28/17 93 lb 3.2 oz (42.3 kg)  01/24/17 90 lb 11.2 oz (41.1 kg)  12/03/16 94 lb (42.6 kg)   Temp Readings from Last 3 Encounters:  01/28/17 98.1 F (36.7 C) (Oral)  01/27/17 98.3 F (36.8 C) (Oral)  01/24/17 98.3 F (36.8 C) (Oral)   BP Readings from Last 3 Encounters:  01/28/17 123/76  01/27/17 95/60  01/24/17 120/63   Pulse Readings from Last 3 Encounters:  01/28/17 76  01/27/17 70  01/24/17 74   Pain Assessment Pain Score: 6   Pain Loc: Abdomen (left lower side)/10  In general this is a very thin, chronically ill appearing African-American woman in no acute distress. She is alert and oriented x4 and appropriate throughout the examination. HEENT reveals that the patient is normocephalic, atraumatic. Cardiovascular exam reveals a regular rate and rhythm, no clicks rubs or murmurs are auscultated. Chest is clear to auscultation bilaterally. Lymphatic assessment is performed and does not reveal any adenopathy in the cervical, supraclavicular, axillary, or inguinal chains. Abdomen has active bowel sounds in all quadrants and is intact. The abdomen is soft, non tender, non distended. Lower extremities are negative for pretibial pitting edema, deep calf tenderness, cyanosis or clubbing.   ECOG = 1  0 - Asymptomatic (Fully active, able to carry on all predisease activities without restriction)  1 - Symptomatic but completely ambulatory (Restricted in physically strenuous activity but ambulatory and able to carry out work of a light or sedentary nature. For example, light housework, office work)  2 - Symptomatic, <50% in bed during the day (Ambulatory and capable of all self care but unable to carry out any work activities. Up and about more than 50% of waking hours)  3 - Symptomatic, >50% in bed, but not bedbound (Capable of only limited self-care, confined to bed or chair 50% or more of waking hours)  4 - Bedbound (Completely disabled. Cannot carry on any self-care. Totally confined to bed or chair)  5 - Death   Eustace Pen MM, Creech RH, Tormey DC, et al. [phone]). "Toxicity and response criteria of the [facility]". [place] Oncol. 5 (6): 649-55    LABORATORY DATA:  Lab Results  Component Value Date   WBC 10.5 (H) 01/27/2017   HGB 7.9 (L) 01/27/2017   HCT 24.6 (L) 01/27/2017   MCV 83.1 01/27/2017   PLT 293 01/27/2017   Lab Results  Component Value Date   NA 139 01/24/2017   K 4.6  01/24/2017   CL 98 (L) 09/12/2016   CO2 27 01/24/2017   Lab Results  Component Value Date   ALT <6 01/24/2017   AST 17 01/24/2017   ALKPHOS 66 01/24/2017   BILITOT 0.35 01/24/2017      RADIOGRAPHY: Nm Pet Image Restag (ps) Skull Base To Thigh  Result Date: 01/15/2017 CLINICAL DATA:  Subsequent treatment strategy for diffuse large B-cell lymphoma. EXAM:  NUCLEAR MEDICINE PET SKULL BASE TO THIGH TECHNIQUE: 5.0 mCi F-18 FDG was injected intravenously. Full-ring PET imaging was performed from the skull base to thigh after the radiotracer. CT data was obtained and used for attenuation correction and anatomic localization. FASTING BLOOD GLUCOSE:  Value: 89 mg/dl COMPARISON:  Multiple exams, including PET-CT dated 11/04/2016 FINDINGS: NECK No hypermetabolic lymph nodes in the neck. Hypoplastic sinuses. CHEST Left supraclavicular node measures 1.0 cm in short axis (formerly 0.8 cm) with maximum SUV 40.8 (formerly 37.6). Right Port-A-Cath tip:  Cavoatrial junction.  Mild cardiomegaly. ABDOMEN/PELVIS Enlarging mesenteric and retroperitoneal nodal masses are identified. Index left periaortic lymph node 3.6 cm in short axis on image 109/4 (formerly 2.0 cm) with maximum SUV 41.9 (formerly 57.7). Mildly hypermetabolic right kidney lower pole exophytic mass measuring 3.5 cm in diameter on image 107/4, essentially stable from 08/12/2016, appearance likely reflecting renal cell carcinoma. Mild hypodensity along the falciform ligament, without hypermetabolic activity, likely from focal fatty infiltration. Mild left hydronephrosis and hydroureter probably from extrinsic mass effect from the retroperitoneal adenopathy on the ureter. The distal ureter does not appear significantly expanded. There scattered pelvic phleboliths. Small amount of free pelvic fluid. Pelvic floor laxity, questionable mild anal prolapse. Mild mesenteric edema. Diffuse mild subcutaneous edema. SKELETON Notable arthropathy in various locations, for  example with calcifications in both glenohumeral joints and degenerative findings; sclerosis and some erosive arthropathy along with chondrocalcinosis at the sternoclavicular joints right greater than left; bilateral sacroiliac joint arthropathy; chondrocalcinosis, spurring, and irregularity of the pubic articulations; and right hip arthropathy. Left total hip prosthesis. The previous generalized marrow uptake seen on PET-CT has resolved. No current compelling findings of active malignancy in the visualized skeleton. IMPRESSION: 1. Enlarging retroperitoneal and mesenteric adenopathy in the abdomen, with persistent very high metabolic activity. Mild enlargement of the highly hypermetabolic left supraclavicular node, which is the only extra-abdominal manifestation of malignancy. 2. Stable mildly hypermetabolic right kidney lower pole mass measuring 3.5 cm, compatible with a renal cell carcinoma. 3. Mild left hydronephrosis and proximal hydroureter thought to be due to extrinsic narrowing of the ureter related to the left periaortic masses. 4. Other imaging findings of potential clinical significance: Mild cardiomegaly. Small amount of free pelvic fluid. Pelvic floor laxity, questionable mild anal prolapse. Mesenteric and subcutaneous edema, query third spacing of fluid. Notable arthropathy of large joints with chondrocalcinosis as detailed above. Electronically Signed   By: Van Clines M.D.   On: 01/15/2017 10:59       IMPRESSION/PLAN: 1. Diffuse B Cell Lymphoma. The patient has persistent disease in the retroperitoneum, left supraclavicular region, and within the mesentery. Dr. Lisbeth Renshaw reviews the rationale for radiotherapy to these sites and would offer her a course of 3 weeks of daily therapy. He anticipates improvement in her sypmtoms of discomfort in the abdomen.  We discussed the risks, benefits, short, and long term effects of radiotherapy, and the patient is interested in proceeding. Dr. Lisbeth Renshaw  discusses the delivery and logistics of radiotherapy and the patient will return on Friday morning this week for simulation. Written consent is obtained and placed in the chart, a copy was provided to the patient.  The above documentation reflects my direct findings during this shared patient visit. Please see the separate note by Dr. Lisbeth Renshaw on this date for the remainder of the patient's plan of care.    Carola Rhine, PAC  This document serves as a record of services personally performed by Kyung Rudd, MD and Shona Simpson, PA-C. It was created  on their behalf by Rae Lips, a trained medical scribe. The creation of this record is based on the scribe's personal observations and the providers' statements to them. This document has been checked and approved by the attending providers.

## 2017-01-28 NOTE — Progress Notes (Signed)
Please see the Nurse Progress Note in the MD Initial Consult Encounter for this patient. 

## 2017-01-29 ENCOUNTER — Telehealth: Payer: Self-pay | Admitting: *Deleted

## 2017-01-29 ENCOUNTER — Ambulatory Visit: Payer: Medicare Other | Admitting: Radiation Oncology

## 2017-01-29 ENCOUNTER — Ambulatory Visit: Payer: Medicare Other | Admitting: Physical Therapy

## 2017-01-29 DIAGNOSIS — R600 Localized edema: Secondary | ICD-10-CM

## 2017-01-29 DIAGNOSIS — M6281 Muscle weakness (generalized): Secondary | ICD-10-CM

## 2017-01-29 NOTE — Telephone Encounter (Signed)
Left message to call back - test result, need follow up appointment

## 2017-01-29 NOTE — Telephone Encounter (Signed)
-----   Message from Leonie Man, MD sent at 01/21/2017 11:17 PM EDT ----- Echocardiogram essentially confirms what we saw mitral valve with severe mitral regurgitation It looks like the pump function maybe a little bit worse for the same. This is probably easier to discuss in the clinic visit -- we need to check on symptoms & discuss possible treatment options.  She is due to see me in October - can discuss then.  Glenetta Hew, MD  Pls forward to PCP: Smothers, Andree Elk, NP

## 2017-01-29 NOTE — Therapy (Signed)
[place], [place], [address] Phone: [phone]   Fax:  [phone]  Physical Therapy Treatment  Patient Details  Name: Christina Peterson MRN: [id number] Date of Birth: 1941/01/06 Referring Provider: Dr. Sullivan Lone  Encounter Date: 01/29/2017      PT End of Session - 01/29/17 1218    Visit Number 4   Number of Visits 19   Date for PT Re-Evaluation 03/14/17   PT Start Time 0930   PT Stop Time 1015   PT Time Calculation (min) 45 min   Activity Tolerance Patient tolerated treatment well   Behavior During Therapy [facility] for tasks assessed/performed      Past Medical History:  Diagnosis Date  . Arthritis   . Diffuse large B-cell lymphoma of lymph nodes of multiple regions ([place]) 08/22/2016  . Hypercholesteremia   . Hypertension    FOLLOWED BY DR Katherine Roan  . Mitral valve prolapse -  severe MR 09/20/2016   Posterior Leaflet  . Severe mitral regurgitation: Per 2 D echo 08/23/2016 08/23/2016   .posterior leaflet prolapse of mitral valve with anteriorly directed mitral regurgitation. Severe. Severely dilated left atrium.  . Severe protein-calorie malnutrition ([place])     Past Surgical History:  Procedure Laterality Date  . ABDOMINAL HYSTERECTOMY    . IR FLUORO GUIDE PORT INSERTION RIGHT  08/23/2016  . IR US GUIDE VASC ACCESS RIGHT  08/23/2016  . JOINT REPLACEMENT    . 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 TOTAL HIP REVISION  05/02/2011   Procedure: TOTAL HIP REVISION;  Surgeon: Sharmon Revere, MD;  Location: [facility];  Service: Orthopedics;  Laterality: Left;  left total hip revision with bone grafting  . TRANSTHORACIC ECHOCARDIOGRAM  08/23/2013    EF 60-65%. GR 2 DD.posterior leaflet prolapse of mitral valve with anteriorly directed mitral regurgitation. Severe. Severely dilated left atrium.    There were no vitals filed for this visit.      Subjective Assessment - 01/29/17 0939    Subjective Pt  states she took the bandages off on Sunday . She says her legs swelled up by that evening They are still a little sore on the top   Pertinent History Diagnosis is large B cell lymphoma. She has finished chemotherapy for now. Just had a CAT scan today to check on status. h/o left total hip replacement in 2010. Arthritis, especially in hands.   Currently in Pain? Yes   Pain Score 6    Pain Location Foot   Pain Orientation Left  top of left foot    Pain Descriptors / Indicators Aching               LYMPHEDEMA/ONCOLOGY QUESTIONNAIRE - 01/29/17 0941      Right Lower Extremity Lymphedema   30 cm Proximal to Floor at Lateral Plantar Foot 30 cm   20 cm Proximal to Floor at Lateral Plantar Foot 24 1   10  cm Proximal to Floor at Lateral Malleoli 22 cm   5 cm Proximal to 1st MTP Joint 20.4 cm   Across MTP Joint 20.5 cm   Around Proximal Great Toe 6.9 cm     Left Lower Extremity Lymphedema   30 cm Proximal to Floor at Lateral Plantar Foot 30 cm   20 cm Proximal to Floor at Lateral Plantar Foot 24 cm   10 cm Proximal to Floor at Lateral Malleoli 22 cm   5 cm Proximal to 1st MTP Joint  22.4 cm   Across MTP Joint 21.5 cm   Around Proximal Great Toe 7 cm                  [facility] Adult PT Treatment/Exercise - 01/29/17 0001      Manual Therapy   Manual Therapy Edema management   Edema Management remeasued legs    Compression Bandaging To Bil LE's: Biotone lotion, thick stockinette, Elastomull to toes 1-3, Artiflex x1, 1-8 cm (combined roman sandal and ASH technique with this bandage) and 2-10 cm short stretch compression bandage from foot to knee. Placed surgical shoe covers over feet as pts shoes would not fit over bandages.                    Short Term Clinic Goals - 01/24/17 1231      CC Short Term Goal  #1   Title Pt. will have a reduction of at least 0.5 cm. in circumferences of both legs at 10 cm. proximal to floor at lataeral malleoli   Baseline right 23.5  cm. and left 23.7 cm. at eval, right at 20 and left at 21 cm on 01/24/2017    Status Achieved     CC Short Term Goal  #2   Title reduction of at least 0.5 cm. at 5 cm. proximal to 1st MTP on both feet   Baseline 22.3 cm. on right and 22 cm. on left at eval, right at 20, left at 19.1 on 01/24/2017   Status Achieved             Long Term Clinic Goals - 01/24/17 1232      CC Long Term Goal  #1   Title circumference reduction of at least 1 cm. at 10 cm. proximal to floor at lateral malleoli on both legs   Baseline 23.5 cm. on right and 23.7 cm. on left at eval, 20.8 on right and 21 on left on 01/24/2017   Status Achieved     CC Long Term Goal  #2   Title Pt. will have been fitted for compression knee highs for both legs   Status On-going     CC Long Term Goal  #3   Title Pt. will be knowledgeable about self-care for swelling   Status On-going            Plan - 01/29/17 1223    Clinical Impression Statement Pt had return of lymphedema without compression for a few days . Pt was rewrapped today and will keep bandages on til Friday.  Then, Dawson Bills, DME supplier, will remove bandages and measure for day and nighttime garments. Pt will need to keep coming for complete decongetive therapy until garments arrive.  She will be starting radiation on Friday   PT Frequency 3x / week   PT Duration 6 weeks   PT Next Visit Plan Rewrap legs after measurement for garments.  Consider increasing appts to 3x a week so pt can have more consistent bandaging if she is able to come that ofter.  Continue CDT    Consulted and Agree with Plan of Care Patient      Patient will benefit from skilled therapeutic intervention in order to improve the following deficits and impairments:  Increased edema, Decreased strength, Impaired flexibility, Difficulty walking  Visit Diagnosis: Localized edema  Muscle weakness (generalized)     Problem List Patient Active Problem List   Diagnosis Date  Noted  . Mitral valve prolapse -  severe  MR 09/20/2016  . Aspiration pneumonia due to inhalation of vomitus ([place]) 09/12/2016  . Hypokalemia 09/12/2016  . Counseling regarding advanced care planning and goals of care   . Sepsis ([place]) 09/07/2016  . Nausea and vomiting 09/07/2016  . Ileus ([place]) 09/07/2016  . Severe mitral regurgitation: Per 2 D echo 08/23/2016 08/23/2016  . Diffuse large B-cell lymphoma of lymph nodes of multiple regions ([place]) 08/22/2016  . B-cell lymphoma ([place])   . Renal cell adenocarcinoma ([place])   . History of peristent atrial fibrillation 08/20/2016  . Abdominal aortic atherosclerosis ([place]) - noted on CT scan 08/20/2016  . Severe protein-calorie malnutrition ([place])   . Hyponatremia 08/11/2016  . Normocytic normochromic anemia 08/11/2016  . Essential hypertension 08/11/2016  . Constipation 08/11/2016  . Hyperlipidemia 08/11/2016  . Abdominal pain 08/11/2016  . Renal mass, right 08/11/2016  . Adenopathy 08/11/2016  . Other intra-abdominal and pelvic swelling, mass and lump 08/11/2016  . Pain in left hip 06/05/2016   Donato Heinz. Owens Shark PT  Norwood Levo 01/29/2017, 12:40 PM  [place], [place], [address] Phone: [phone]   Fax:  [phone]  Name: Christina Peterson MRN: [id number] Date of Birth: Oct 23, 1940

## 2017-01-31 ENCOUNTER — Ambulatory Visit
Admission: RE | Admit: 2017-01-31 | Discharge: 2017-01-31 | Disposition: A | Discharge status: Home / Self Care | Payer: Medicare Other | Source: Ambulatory Visit | Attending: Radiation Oncology | Admitting: Radiation Oncology

## 2017-01-31 ENCOUNTER — Ambulatory Visit: Payer: Medicare Other | Admitting: Physical Therapy

## 2017-01-31 DIAGNOSIS — Z510 Encounter for antineoplastic radiation therapy: Secondary | ICD-10-CM | POA: Diagnosis not present

## 2017-01-31 DIAGNOSIS — R600 Localized edema: Secondary | ICD-10-CM

## 2017-01-31 DIAGNOSIS — C8338 Diffuse large B-cell lymphoma, lymph nodes of multiple sites: Secondary | ICD-10-CM

## 2017-01-31 NOTE — Therapy (Signed)
[place], [place], [address] Phone: [phone]   Fax:  [phone]  Physical Therapy Treatment  Patient Details  Name: Christina Peterson MRN: [id number] Date of Birth: [birth year] Referring Provider: Dr. Sullivan Lone  Encounter Date: 01/31/2017      PT End of Session - 01/31/17 1230    Visit Number 5   Number of Visits 19   Date for PT Re-Evaluation 03/14/17   PT Start Time 1000  late start due to fitting by DME vendor   PT Stop Time 1024   PT Time Calculation (min) 24 min   Activity Tolerance Patient tolerated treatment well   Behavior During Therapy [facility] for tasks assessed/performed      Past Medical History:  Diagnosis Date  . Arthritis   . Diffuse large B-cell lymphoma of lymph nodes of multiple regions ([place]) 08/22/2016  . Hypercholesteremia   . Hypertension    FOLLOWED BY DR Katherine Roan  . Mitral valve prolapse -  severe MR 09/20/2016   Posterior Leaflet  . Severe mitral regurgitation: Per 2 D echo 08/23/2016 08/23/2016   .posterior leaflet prolapse of mitral valve with anteriorly directed mitral regurgitation. Severe. Severely dilated left atrium.  . Severe protein-calorie malnutrition ([place])     Past Surgical History:  Procedure Laterality Date  . ABDOMINAL HYSTERECTOMY    . IR FLUORO GUIDE PORT INSERTION RIGHT  08/23/2016  . IR US GUIDE VASC ACCESS RIGHT  08/23/2016  . JOINT REPLACEMENT    . 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 TOTAL HIP REVISION  05/02/2011   Procedure: TOTAL HIP REVISION;  Surgeon: Sharmon Revere, MD;  Location: [facility];  Service: Orthopedics;  Laterality: Left;  left total hip revision with bone grafting  . TRANSTHORACIC ECHOCARDIOGRAM  08/23/2013    EF 60-65%. GR 2 DD.posterior leaflet prolapse of mitral valve with anteriorly directed mitral regurgitation. Severe. Severely dilated left atrium.    There were no vitals filed for this visit.      Subjective  Assessment - 01/31/17 0952    Subjective Tolerated bandages okay--started to have a little itch on the top of the foot last night.   Currently in Pain? No/denies                         [facility] Adult PT Treatment/Exercise - 01/31/17 0001      Self-Care   Self-Care Other Self-Care Comments     Manual Therapy   Manual therapy comments Although our clinical measurements were not taken today, patient's legs, ankles, and feet look good today with minimal swelling.   Edema Management DME provider was here at beginning of session to measure patient for Elvarex stockings with partial zipper, to discuss nighttime compression (Juxtafits) and the possibility of getting a pump.    Compression Bandaging To Bil LE's: thick stockinette, Elastomull to toes 1-3, Artiflex x1, 1-8 cm (combined roman sandal and ASH technique with this bandage) and 2-10 cm short stretch compression bandage from foot to knee. Pt. wore plastic slip-on sandals that functioned with the bandages.                   Short Term Clinic Goals - 01/24/17 1231      CC Short Term Goal  #1   Title Pt. will have a reduction of at least 0.5 cm. in circumferences of both legs at 10 cm. proximal to floor at lataeral  malleoli   Baseline right 23.5 cm. and left 23.7 cm. at eval, right at 20 and left at 21 cm on 01/24/2017    Status Achieved     CC Short Term Goal  #2   Title reduction of at least 0.5 cm. at 5 cm. proximal to 1st MTP on both feet   Baseline 22.3 cm. on right and 22 cm. on left at eval, right at 20, left at 19.1 on 01/24/2017   Status Achieved             Long Term Clinic Goals - 01/31/17 1233      CC Long Term Goal  #2   Title Pt. will have been fitted for compression knee highs for both legs   Baseline Measurements taken 01/31/17   Status Partially Met            Plan - 01/31/17 1231    Clinical Impression Statement Although clinical measurements were not taken today, patient's  legs looked very good in terms of minimal swelling today.  She was measured for garments, with DME vendor recommending Elvarex stockings with partial zippers, Relax garments for nighttime, and possibly a pump for home use (the latter to be decided later). Garments should come in a couple of weeks.   Rehab Potential Good   PT Frequency 3x / week   PT Duration 6 weeks   PT Treatment/Interventions ADLs/Self Care Home Management;DME Instruction;Gait training;Stair training;Therapeutic exercise;Balance training;Neuromuscular re-education;Patient/family education;Orthotic Fit/Training;Manual techniques;Manual lymph drainage;Compression bandaging;Passive range of motion   PT Next Visit Plan Continue complete decongestive therapy until garments are received and fitted.   Consulted and Agree with Plan of Care Patient      Patient will benefit from skilled therapeutic intervention in order to improve the following deficits and impairments:  Increased edema, Decreased strength, Impaired flexibility, Difficulty walking  Visit Diagnosis: Localized edema     Problem List Patient Active Problem List   Diagnosis Date Noted  . Mitral valve prolapse -  severe MR 09/20/2016  . Aspiration pneumonia due to inhalation of vomitus ([place]) 09/12/2016  . Hypokalemia 09/12/2016  . Counseling regarding advanced care planning and goals of care   . Sepsis ([place]) 09/07/2016  . Nausea and vomiting 09/07/2016  . Ileus ([place]) 09/07/2016  . Severe mitral regurgitation: Per 2 D echo 08/23/2016 08/23/2016  . Diffuse large B-cell lymphoma of lymph nodes of multiple regions ([place]) 08/22/2016  . B-cell lymphoma ([place])   . Renal cell adenocarcinoma ([place])   . History of peristent atrial fibrillation 08/20/2016  . Abdominal aortic atherosclerosis ([place]) - noted on CT scan 08/20/2016  . Severe protein-calorie malnutrition ([place])   . Hyponatremia 08/11/2016  . Normocytic normochromic anemia 08/11/2016  . Essential hypertension  08/11/2016  . Constipation 08/11/2016  . Hyperlipidemia 08/11/2016  . Abdominal pain 08/11/2016  . Renal mass, right 08/11/2016  . Adenopathy 08/11/2016  . Other intra-abdominal and pelvic swelling, mass and lump 08/11/2016  . Pain in left hip 06/05/2016    SALISBURY,DONNA 01/31/2017, 12:34 PM  [place], [place], [address] Phone: [phone]   Fax:  [phone]  Name: Christina Peterson MRN: [id number] Date of Birth: 1940/09/17  Serafina Royals, PT 01/31/17 12:35 PM

## 2017-02-03 ENCOUNTER — Ambulatory Visit (HOSPITAL_COMMUNITY): Payer: Medicare Other

## 2017-02-03 ENCOUNTER — Inpatient Hospital Stay (HOSPITAL_COMMUNITY): Admission: RE | Admit: 2017-02-03 | Payer: Medicare Other | Source: Ambulatory Visit

## 2017-02-04 ENCOUNTER — Telehealth: Payer: Self-pay

## 2017-02-04 NOTE — Telephone Encounter (Signed)
Received fax from [facility] requesting clarification for compression stocking order d/t lymphedema. Signed order sent with last OV note as requested. Confirmed fax receipt at [phone].

## 2017-02-05 ENCOUNTER — Telehealth: Payer: Self-pay

## 2017-02-05 ENCOUNTER — Ambulatory Visit: Payer: Medicare Other

## 2017-02-05 DIAGNOSIS — R600 Localized edema: Secondary | ICD-10-CM | POA: Diagnosis not present

## 2017-02-05 NOTE — Telephone Encounter (Signed)
Christina Peterson with [employer] called making sure [place] received fax they sent on 10/17. Called back with documentation fax was sent back yesterday. He did find the fax.

## 2017-02-05 NOTE — Therapy (Signed)
[place], [place], [address] Phone: [phone]   Fax:  [phone]  Physical Therapy Treatment  Patient Details  Name: Christina Peterson MRN: [id number] Date of Birth: February 19, 1941 Referring Provider: Dr. Sullivan Lone  Encounter Date: 02/05/2017      PT End of Session - 02/05/17 1020    Visit Number 6   Number of Visits 19   Date for PT Re-Evaluation 03/14/17   PT Start Time 0937   PT Stop Time 1018   PT Time Calculation (min) 41 min   Activity Tolerance Patient tolerated treatment well   Behavior During Therapy [facility] for tasks assessed/performed      Past Medical History:  Diagnosis Date  . Arthritis   . Diffuse large B-cell lymphoma of lymph nodes of multiple regions ([place]) 08/22/2016  . Hypercholesteremia   . Hypertension    FOLLOWED BY DR Katherine Roan  . Mitral valve prolapse -  severe MR 09/20/2016   Posterior Leaflet  . Severe mitral regurgitation: Per 2 D echo 08/23/2016 08/23/2016   .posterior leaflet prolapse of mitral valve with anteriorly directed mitral regurgitation. Severe. Severely dilated left atrium.  . Severe protein-calorie malnutrition ([place])     Past Surgical History:  Procedure Laterality Date  . ABDOMINAL HYSTERECTOMY    . IR FLUORO GUIDE PORT INSERTION RIGHT  08/23/2016  . IR US GUIDE VASC ACCESS RIGHT  08/23/2016  . JOINT REPLACEMENT    . 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 TOTAL HIP REVISION  05/02/2011   Procedure: TOTAL HIP REVISION;  Surgeon: Sharmon Revere, MD;  Location: [facility];  Service: Orthopedics;  Laterality: Left;  left total hip revision with bone grafting  . TRANSTHORACIC ECHOCARDIOGRAM  08/23/2013    EF 60-65%. GR 2 DD.posterior leaflet prolapse of mitral valve with anteriorly directed mitral regurgitation. Severe. Severely dilated left atrium.    There were no vitals filed for this visit.      Subjective Assessment - 02/05/17 0941    Subjective I  took bandages off Sunday morning like she said I could. My legs were itching so I put lotion on them and that helped. My garments should arrive in about 2 weeks.    Pertinent History Diagnosis is large B cell lymphoma. She has finished chemotherapy for now. Just had a CAT scan today to check on status. h/o left total hip replacement in 2010. Arthritis, especially in hands.   Patient Stated Goals get help with swelling in the feet   Currently in Pain? No/denies                         [facility] Adult PT Treatment/Exercise - 02/05/17 0001      Manual Therapy   Manual Lymphatic Drainage (MLD) In Supine: Short neck, superficial and deep abdominals, Rt axillary nodes, Rt inguino-axillary anastomosis, then Rt LE from dorsal foot to lateral thigh working from proximal to distal then retracing all steps.    Compression Bandaging To Bil LE's: thick stockinette, Elastomull to toes 1-3, Artiflex x1, 1-8 cm (combined roman sandal and ASH technique with this bandage) and 2-10 cm short stretch compression bandage from foot to knee. Pt. wore plastic slip-on sandals that functioned with the bandages.                   Short Term Clinic Goals - 01/24/17 1231      CC Short Term Goal  #  1   Title Pt. will have a reduction of at least 0.5 cm. in circumferences of both legs at 10 cm. proximal to floor at lataeral malleoli   Baseline right 23.5 cm. and left 23.7 cm. at eval, right at 20 and left at 21 cm on 01/24/2017    Status Achieved     CC Short Term Goal  #2   Title reduction of at least 0.5 cm. at 5 cm. proximal to 1st MTP on both feet   Baseline 22.3 cm. on right and 22 cm. on left at eval, right at 20, left at 19.1 on 01/24/2017   Status Achieved             Long Term Clinic Goals - 01/31/17 1233      CC Long Term Goal  #2   Title Pt. will have been fitted for compression knee highs for both legs   Baseline Measurements taken 01/31/17   Status Partially Met             Plan - 02/05/17 1020    Clinical Impression Statement Pt continues tolerating complete decongestive therapy well with no skin irritation. She had removed bandages Sunday afternoon but was wwearing thick stockinettes today. Continued with manual lymph draiange and compression bandaging as before to help pt maintain reductions attained this far until compression garments arrive.    Rehab Potential Good   PT Frequency 3x / week   PT Duration 6 weeks   PT Treatment/Interventions ADLs/Self Care Home Management;DME Instruction;Gait training;Stair training;Therapeutic exercise;Balance training;Neuromuscular re-education;Patient/family education;Orthotic Fit/Training;Manual techniques;Manual lymph drainage;Compression bandaging;Passive range of motion   PT Next Visit Plan Continue complete decongestive therapy until garments are received and fitted.   Consulted and Agree with Plan of Care Patient      Patient will benefit from skilled therapeutic intervention in order to improve the following deficits and impairments:  Increased edema, Decreased strength, Impaired flexibility, Difficulty walking  Visit Diagnosis: Localized edema     Problem List Patient Active Problem List   Diagnosis Date Noted  . Mitral valve prolapse -  severe MR 09/20/2016  . Aspiration pneumonia due to inhalation of vomitus ([place]) 09/12/2016  . Hypokalemia 09/12/2016  . Counseling regarding advanced care planning and goals of care   . Sepsis ([place]) 09/07/2016  . Nausea and vomiting 09/07/2016  . Ileus ([place]) 09/07/2016  . Severe mitral regurgitation: Per 2 D echo 08/23/2016 08/23/2016  . Diffuse large B-cell lymphoma of lymph nodes of multiple regions ([place]) 08/22/2016  . B-cell lymphoma ([place])   . Renal cell adenocarcinoma ([place])   . History of peristent atrial fibrillation 08/20/2016  . Abdominal aortic atherosclerosis ([place]) - noted on CT scan 08/20/2016  . Severe protein-calorie malnutrition ([place])   .  Hyponatremia 08/11/2016  . Normocytic normochromic anemia 08/11/2016  . Essential hypertension 08/11/2016  . Constipation 08/11/2016  . Hyperlipidemia 08/11/2016  . Abdominal pain 08/11/2016  . Renal mass, right 08/11/2016  . Adenopathy 08/11/2016  . Other intra-abdominal and pelvic swelling, mass and lump 08/11/2016  . Pain in left hip 06/05/2016    Otelia Limes, PTA 02/05/2017, 10:22 AM  [place], [place], [address] Phone: [phone]   Fax:  [phone]  Name: LIZZET HENDLEY MRN: [id number] Date of Birth: 11-09-1940

## 2017-02-06 ENCOUNTER — Ambulatory Visit (HOSPITAL_COMMUNITY)
Admission: RE | Admit: 2017-02-06 | Discharge: 2017-02-06 | Disposition: A | Discharge status: Home / Self Care | Payer: Medicare Other | Source: Ambulatory Visit | Attending: Family | Admitting: Family

## 2017-02-06 DIAGNOSIS — R1310 Dysphagia, unspecified: Secondary | ICD-10-CM | POA: Diagnosis not present

## 2017-02-07 ENCOUNTER — Ambulatory Visit: Payer: Medicare Other | Admitting: Physical Therapy

## 2017-02-07 DIAGNOSIS — Z510 Encounter for antineoplastic radiation therapy: Secondary | ICD-10-CM | POA: Diagnosis not present

## 2017-02-07 DIAGNOSIS — R600 Localized edema: Secondary | ICD-10-CM | POA: Diagnosis not present

## 2017-02-07 NOTE — Therapy (Signed)
[facility] [address] [place], [place], [address] Phone: [phone]   Fax:  [phone]  Physical Therapy Treatment  Patient Details  Name: Christina Peterson MRN: [id number] Date of Birth: 04/18/1940 Referring Provider: Dr. Gautam Kale  Encounter Date: 02/07/2017      PT End of Session - 02/07/17 1235    Visit Number 7   Number of Visits 19   Date for PT Re-Evaluation 03/14/17   PT Start Time 0932   PT Stop Time 1020   PT Time Calculation (min) 48 min   Activity Tolerance Patient tolerated treatment well   Behavior During Therapy [facility] for tasks assessed/performed      Past Medical History:  Diagnosis Date  . Arthritis   . Diffuse large B-cell lymphoma of lymph nodes of multiple regions ([facility]) 08/22/2016  . Hypercholesteremia   . Hypertension    FOLLOWED BY DR KILPATRICK  . Mitral valve prolapse -  severe MR 09/20/2016   Posterior Leaflet  . Severe mitral regurgitation: Per 2 D echo 08/23/2016 08/23/2016   .posterior leaflet prolapse of mitral valve with anteriorly directed mitral regurgitation. Severe. Severely dilated left atrium.  . Severe protein-calorie malnutrition ([facility])     Past Surgical History:  Procedure Laterality Date  . ABDOMINAL HYSTERECTOMY    . IR FLUORO GUIDE PORT INSERTION RIGHT  08/23/2016  . IR US GUIDE VASC ACCESS RIGHT  08/23/2016  . JOINT REPLACEMENT    . 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 TOTAL HIP REVISION  05/02/2011   Procedure: TOTAL HIP REVISION;  Surgeon: Arthur F Carter, MD;  [facility];  Service: Orthopedics;  Laterality: Left;  left total hip revision with bone grafting  . TRANSTHORACIC ECHOCARDIOGRAM  08/23/2013    EF 60-65%. GR 2 DD.posterior leaflet prolapse of mitral valve with anteriorly directed mitral regurgitation. Severe. Severely dilated left atrium.    There were no vitals filed for this visit.      Subjective Assessment - 02/07/17 0934    Subjective Doing  fine with the bandages.  "I'm going to try to leave them on till Monday this time."   Currently in Pain? Yes   Pain Score 9    Pain Location Flank   Pain Orientation Left   Pain Descriptors / Indicators Sharp   Aggravating Factors  first thing in the morning   Pain Relieving Factors pain pill                         [facility] Adult PT Treatment/Exercise - 02/07/17 0001      Manual Therapy   Edema Management removed bandages and washed patient's lower legs while checking skin, which looks good   Manual Lymphatic Drainage (MLD) In Supine: Short neck, superficial and deep abdominals, Rt axillary nodes, Rt inguino-axillary anastomosis, then Rt LE from dorsal foot to lateral thigh working from proximal to distal then retracing all steps. Then same on left side.   Compression Bandaging To Bil LE's: thick stockinette, Elastomull to toes 1-3, Artiflex x1, 1-8 cm (combined roman sandal and ASH technique with this bandage) and 2-10 cm short stretch compression bandage from foot to knee. Pt. wore plastic slip-on sandals that functioned with the bandages.                   Short Term Clinic Goals - 01/24/17 1231      CC Short Term Goal  #1     Title Pt. will have a reduction of at least 0.5 cm. in circumferences of both legs at 10 cm. proximal to floor at lataeral malleoli   Baseline right 23.5 cm. and left 23.7 cm. at eval, right at 20 and left at 21 cm on 01/24/2017    Status Achieved     CC Short Term Goal  #2   Title reduction of at least 0.5 cm. at 5 cm. proximal to 1st MTP on both feet   Baseline 22.3 cm. on right and 22 cm. on left at eval, right at 20, left at 19.1 on 01/24/2017   Status Achieved             Long Term Clinic Goals - 01/31/17 1233      CC Long Term Goal  #2   Title Pt. will have been fitted for compression knee highs for both legs   Baseline Measurements taken 01/31/17   Status Partially Met            Plan - 02/07/17 1235     Clinical Impression Statement Doing well.  Legs look good and she is tolerating the bandages without problems. She plans to try to keep them on an extra day over the weekend.   Rehab Potential Good   PT Frequency 3x / week   PT Duration 6 weeks   PT Next Visit Plan Continue complete decongestive therapy until garments are received and fitted.   Consulted and Agree with Plan of Care Patient      Patient will benefit from skilled therapeutic intervention in order to improve the following deficits and impairments:  Increased edema, Decreased strength, Impaired flexibility, Difficulty walking  Visit Diagnosis: Localized edema     Problem List Patient Active Problem List   Diagnosis Date Noted  . Mitral valve prolapse -  severe MR 09/20/2016  . Aspiration pneumonia due to inhalation of vomitus ([facility]) 09/12/2016  . Hypokalemia 09/12/2016  . Counseling regarding advanced care planning and goals of care   . Sepsis ([facility]) 09/07/2016  . Nausea and vomiting 09/07/2016  . Ileus ([facility]) 09/07/2016  . Severe mitral regurgitation: Per 2 D echo 08/23/2016 08/23/2016  . Diffuse large B-cell lymphoma of lymph nodes of multiple regions ([facility]) 08/22/2016  . B-cell lymphoma ([facility])   . Renal cell adenocarcinoma ([facility])   . History of peristent atrial fibrillation 08/20/2016  . Abdominal aortic atherosclerosis ([facility]) - noted on CT scan 08/20/2016  . Severe protein-calorie malnutrition ([facility])   . Hyponatremia 08/11/2016  . Normocytic normochromic anemia 08/11/2016  . Essential hypertension 08/11/2016  . Constipation 08/11/2016  . Hyperlipidemia 08/11/2016  . Abdominal pain 08/11/2016  . Renal mass, right 08/11/2016  . Adenopathy 08/11/2016  . Other intra-abdominal and pelvic swelling, mass and lump 08/11/2016  . Pain in left hip 06/05/2016    SALISBURY,DONNA 02/07/2017, 12:36 PM  [facility] [address] [place], [place], [address] Phone:  [phone]   Fax:  [phone]  Name: Zaara B Landrus MRN: [id number] Date of Birth: 02/26/1941  Donna Salisbury, PT 02/07/17 12:36 PM  

## 2017-02-10 ENCOUNTER — Ambulatory Visit
Admission: RE | Admit: 2017-02-10 | Discharge: 2017-02-10 | Disposition: A | Discharge status: Home / Self Care | Payer: Medicare Other | Source: Ambulatory Visit | Attending: Radiation Oncology | Admitting: Radiation Oncology

## 2017-02-10 ENCOUNTER — Ambulatory Visit: Payer: Medicare Other

## 2017-02-10 ENCOUNTER — Telehealth: Payer: Self-pay | Admitting: *Deleted

## 2017-02-10 DIAGNOSIS — R600 Localized edema: Secondary | ICD-10-CM | POA: Diagnosis not present

## 2017-02-10 DIAGNOSIS — R599 Enlarged lymph nodes, unspecified: Secondary | ICD-10-CM

## 2017-02-10 DIAGNOSIS — Z510 Encounter for antineoplastic radiation therapy: Secondary | ICD-10-CM | POA: Diagnosis not present

## 2017-02-10 DIAGNOSIS — R591 Generalized enlarged lymph nodes: Secondary | ICD-10-CM

## 2017-02-10 DIAGNOSIS — C8338 Diffuse large B-cell lymphoma, lymph nodes of multiple sites: Secondary | ICD-10-CM

## 2017-02-10 MED ORDER — ALRA NON-METALLIC DEODORANT (RAD-ONC)
1.0000 "application " | Freq: Once | TOPICAL | Status: AC
  Administered 2017-02-10: 1 via TOPICAL

## 2017-02-10 MED ORDER — MAGNESIUM OXIDE 400 (241.3 MG) MG PO TABS: 400.0000 mg | ORAL_TABLET | Freq: Two times a day (BID) | ORAL | 0 refills | Status: AC

## 2017-02-10 MED ORDER — RADIAPLEXRX EX GEL
Freq: Once | CUTANEOUS | Status: AC
  Administered 2017-02-10: 13:00:00 via TOPICAL

## 2017-02-10 NOTE — Progress Notes (Signed)
Pt here for patient teaching.  Pt given Radiation and You booklet, skin care instructions, Alra deodorant and Radiaplex gel.  Reviewed areas of pertinence such as diarrhea, fatigue, hair loss, nausea and vomiting, skin changes, throat changes, breast tenderness and breast swelling . Pt able to give teach back of to pat skin, use unscented/gentle soap and drink plenty of water,apply Radiaplex bid, avoid applying anything to skin within 4 hours of treatment, avoid wearing an under wire bra and to use an electric razor if they must shave. Pt verbalizes understanding of information given and will contact nursing with any questions or concerns.     [email or web address]

## 2017-02-10 NOTE — Therapy (Signed)
[place], [place], [address] Phone: [phone]   Fax:  [phone]  Physical Therapy Treatment  Patient Details  Name: Christina Peterson MRN: [id number] Date of Birth: 1940/11/28 Referring Provider: Dr. Sullivan Lone  Encounter Date: 02/10/2017      PT End of Session - 02/10/17 1126    Visit Number 8   Number of Visits 19   Date for PT Re-Evaluation 03/14/17   PT Start Time 1937   PT Stop Time 1109   PT Time Calculation (min) 46 min   Activity Tolerance Patient tolerated treatment well   Behavior During Therapy [facility] for tasks assessed/performed      Past Medical History:  Diagnosis Date  . Arthritis   . Diffuse large B-cell lymphoma of lymph nodes of multiple regions ([place]) 08/22/2016  . Hypercholesteremia   . Hypertension    FOLLOWED BY DR Katherine Roan  . Mitral valve prolapse -  severe MR 09/20/2016   Posterior Leaflet  . Severe mitral regurgitation: Per 2 D echo 08/23/2016 08/23/2016   .posterior leaflet prolapse of mitral valve with anteriorly directed mitral regurgitation. Severe. Severely dilated left atrium.  . Severe protein-calorie malnutrition ([place])     Past Surgical History:  Procedure Laterality Date  . ABDOMINAL HYSTERECTOMY    . IR FLUORO GUIDE PORT INSERTION RIGHT  08/23/2016  . IR US GUIDE VASC ACCESS RIGHT  08/23/2016  . JOINT REPLACEMENT    . 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 TOTAL HIP REVISION  05/02/2011   Procedure: TOTAL HIP REVISION;  Surgeon: Sharmon Revere, MD;  Location: [facility];  Service: Orthopedics;  Laterality: Left;  left total hip revision with bone grafting  . TRANSTHORACIC ECHOCARDIOGRAM  08/23/2013    EF 60-65%. GR 2 DD.posterior leaflet prolapse of mitral valve with anteriorly directed mitral regurgitation. Severe. Severely dilated left atrium.    There were no vitals filed for this visit.      Subjective Assessment - 02/10/17 1027    Subjective  Nothing new with my bandages, just waiting for my compression garments to arrive. My side is still hurting though, it's starting to hurt all day now. Left my abndages on until Sunday evening.    Pertinent History Diagnosis is large B cell lymphoma. She has finished chemotherapy for now. Just had a CAT scan today to check on status. h/o left total hip replacement in 2010. Arthritis, especially in hands.   Patient Stated Goals get help with swelling in the feet   Currently in Pain? Yes   Pain Score 6    Pain Location Flank   Pain Orientation Left   Pain Descriptors / Indicators Dull   Pain Type Acute pain   Pain Onset 1 to 4 weeks ago   Aggravating Factors  first thing in th emorning is worse, but it's starting bothering me through the day   Pain Relieving Factors pain pill helps some but makes me so drowsy                         [facility] Adult PT Treatment/Exercise - 02/10/17 0001      Manual Therapy   Manual Lymphatic Drainage (MLD) In Supine: Short neck, superficial and deep abdominals, Rt axillary nodes, Rt inguino-axillary anastomosis, then Rt LE from dorsal foot to lateral thigh working from proximal to distal then retracing all steps. Then same on left side.   Compression Bandaging To  Bil LE's: thick stockinette, Elastomull to toes 1-3, Artiflex x1, 1-8 cm (combined roman sandal and ASH technique with this bandage) and 2-10 cm short stretch compression bandage from foot to knee. Pt. wore plastic slip-on sandals that functioned with the bandages.                   Short Term Clinic Goals - 01/24/17 1231      CC Short Term Goal  #1   Title Pt. will have a reduction of at least 0.5 cm. in circumferences of both legs at 10 cm. proximal to floor at lataeral malleoli   Baseline right 23.5 cm. and left 23.7 cm. at eval, right at 20 and left at 21 cm on 01/24/2017    Status Achieved     CC Short Term Goal  #2   Title reduction of at least 0.5 cm. at 5 cm. proximal  to 1st MTP on both feet   Baseline 22.3 cm. on right and 22 cm. on left at eval, right at 20, left at 19.1 on 01/24/2017   Status Achieved             Long Term Clinic Goals - 01/31/17 1233      CC Long Term Goal  #2   Title Pt. will have been fitted for compression knee highs for both legs   Baseline Measurements taken 01/31/17   Status Partially Met            Plan - 02/10/17 1127    Clinical Impression Statement Pt is doing very well with wear of bandages and having good skin hygiene. Continued with complete deconestive therapy.   Rehab Potential Good   PT Frequency 3x / week   PT Duration 6 weeks   PT Treatment/Interventions ADLs/Self Care Home Management;DME Instruction;Gait training;Stair training;Therapeutic exercise;Balance training;Neuromuscular re-education;Patient/family education;Orthotic Fit/Training;Manual techniques;Manual lymph drainage;Compression bandaging;Passive range of motion   PT Next Visit Plan Continue complete decongestive therapy until garments are received and fitted.   Consulted and Agree with Plan of Care Patient      Patient will benefit from skilled therapeutic intervention in order to improve the following deficits and impairments:  Increased edema, Decreased strength, Impaired flexibility, Difficulty walking  Visit Diagnosis: Localized edema     Problem List Patient Active Problem List   Diagnosis Date Noted  . Mitral valve prolapse -  severe MR 09/20/2016  . Aspiration pneumonia due to inhalation of vomitus ([place]) 09/12/2016  . Hypokalemia 09/12/2016  . Counseling regarding advanced care planning and goals of care   . Sepsis ([place]) 09/07/2016  . Nausea and vomiting 09/07/2016  . Ileus ([place]) 09/07/2016  . Severe mitral regurgitation: Per 2 D echo 08/23/2016 08/23/2016  . Diffuse large B-cell lymphoma of lymph nodes of multiple regions ([place]) 08/22/2016  . B-cell lymphoma ([place])   . Renal cell adenocarcinoma ([place])   . History of  peristent atrial fibrillation 08/20/2016  . Abdominal aortic atherosclerosis ([place]) - noted on CT scan 08/20/2016  . Severe protein-calorie malnutrition ([place])   . Hyponatremia 08/11/2016  . Normocytic normochromic anemia 08/11/2016  . Essential hypertension 08/11/2016  . Constipation 08/11/2016  . Hyperlipidemia 08/11/2016  . Abdominal pain 08/11/2016  . Renal mass, right 08/11/2016  . Adenopathy 08/11/2016  . Other intra-abdominal and pelvic swelling, mass and lump 08/11/2016  . Pain in left hip 06/05/2016    Otelia Limes, PTA 02/10/2017, 11:30 AM  [place], [place], [address]  Phone: [phone]   Fax:  [phone]  Name: Christina Peterson MRN: [id number] Date of Birth: 07-30-1940

## 2017-02-10 NOTE — Telephone Encounter (Signed)
"  My mother needs a refill om magnesium sent to [facility] on [address].  The bottle reads no refills.  No we did not call the pharmacy."  Will proceed with refill at this time.  Denies further needs or questions at th is time.  Next appointment information provided with this call.  On 12-23-2016 Mg = 1.4.  Next lab and F/U 02-21-2017.

## 2017-02-11 ENCOUNTER — Encounter: Payer: Self-pay | Admitting: *Deleted

## 2017-02-11 ENCOUNTER — Ambulatory Visit
Admission: RE | Admit: 2017-02-11 | Discharge: 2017-02-11 | Disposition: A | Discharge status: Home / Self Care | Payer: Medicare Other | Source: Ambulatory Visit | Attending: Radiation Oncology | Admitting: Radiation Oncology

## 2017-02-11 DIAGNOSIS — Z510 Encounter for antineoplastic radiation therapy: Secondary | ICD-10-CM | POA: Diagnosis not present

## 2017-02-11 NOTE — Progress Notes (Signed)
[place] Psychosocial Distress Screening Clinical Social Work  Clinical Social Work was referred by distress screening protocol.  The patient scored a 8 on the Psychosocial Distress Thermometer which indicates moderate distress. Clinical Social Worker contacted patient at home to assess for distress and other psychosocial needs.  CSWspoke to patients daughter to offer support.  Patients daughter requested information on resources for additional help in the home.  CSW and patients daughter discussed PCS services through patients Medicaid benefits.  CSW team member plans to meet with patient and family to further discuss PCS services and begin application process.  CSW encouraged patient and family to call with questions or concerns.        ONCBCN DISTRESS SCREENING 01/28/2017  Screening Type Initial Screening  Distress experienced in past week (1-10) 8  Emotional problem type Depression;Adjusting to illness  Spiritual/Religous concerns type Relating to God  Physical Problem type Pain;Bathing/dressing;Loss of appetitie;Swollen arms/legs  Physician notified of physical symptoms Yes  Referral to clinical social work Yes  Referral to dietition Yes    Christina Peterson, MSW, LCSW, OSW-C Clinical Social Worker [place] [phone]

## 2017-02-12 ENCOUNTER — Ambulatory Visit: Payer: Medicare Other

## 2017-02-12 ENCOUNTER — Ambulatory Visit
Admission: RE | Admit: 2017-02-12 | Discharge: 2017-02-12 | Disposition: A | Discharge status: Home / Self Care | Payer: Medicare Other | Source: Ambulatory Visit | Attending: Radiation Oncology | Admitting: Radiation Oncology

## 2017-02-12 DIAGNOSIS — R600 Localized edema: Secondary | ICD-10-CM

## 2017-02-12 DIAGNOSIS — Z510 Encounter for antineoplastic radiation therapy: Secondary | ICD-10-CM | POA: Diagnosis not present

## 2017-02-12 NOTE — Therapy (Signed)
[place], [place], [address] Phone: [phone]   Fax:  [phone]  Physical Therapy Treatment  Patient Details  Name: Christina Peterson MRN: [id number] Date of Birth: 1941/02/25 Referring Provider: Dr. Sullivan Lone  Encounter Date: 02/12/2017      PT End of Session - 02/12/17 1104    Visit Number 9   Number of Visits 19   Date for PT Re-Evaluation 03/14/17   PT Start Time 1019   PT Stop Time 1104   PT Time Calculation (min) 45 min   Activity Tolerance Patient tolerated treatment well   Behavior During Therapy [facility] for tasks assessed/performed      Past Medical History:  Diagnosis Date  . Arthritis   . Diffuse large B-cell lymphoma of lymph nodes of multiple regions ([place]) 08/22/2016  . Hypercholesteremia   . Hypertension    FOLLOWED BY DR Katherine Roan  . Mitral valve prolapse -  severe MR 09/20/2016   Posterior Leaflet  . Severe mitral regurgitation: Per 2 D echo 08/23/2016 08/23/2016   .posterior leaflet prolapse of mitral valve with anteriorly directed mitral regurgitation. Severe. Severely dilated left atrium.  . Severe protein-calorie malnutrition ([place])     Past Surgical History:  Procedure Laterality Date  . ABDOMINAL HYSTERECTOMY    . IR FLUORO GUIDE PORT INSERTION RIGHT  08/23/2016  . IR US GUIDE VASC ACCESS RIGHT  08/23/2016  . JOINT REPLACEMENT    . 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 TOTAL HIP REVISION  05/02/2011   Procedure: TOTAL HIP REVISION;  Surgeon: Sharmon Revere, MD;  Location: [facility];  Service: Orthopedics;  Laterality: Left;  left total hip revision with bone grafting  . TRANSTHORACIC ECHOCARDIOGRAM  08/23/2013    EF 60-65%. GR 2 DD.posterior leaflet prolapse of mitral valve with anteriorly directed mitral regurgitation. Severe. Severely dilated left atrium.    There were no vitals filed for this visit.      Subjective Assessment - 02/12/17 1033    Subjective Doing  good, bandages felt good thisw time, they didn't itch so I left them on. I haven't heard about my compression garments yet but I expect to this week as it'll have been 2 weeks. I started radiation Monday and it's every day for the next 3 weeks. My side is feeling better, didn't hurt yesterday or today.    Pertinent History Diagnosis is large B cell lymphoma. She has finished chemotherapy for now. Just had a CAT scan today to check on status. h/o left total hip replacement in 2010. Arthritis, especially in hands.   Patient Stated Goals get help with swelling in the feet   Currently in Pain? No/denies               LYMPHEDEMA/ONCOLOGY QUESTIONNAIRE - 02/12/17 1034      Right Lower Extremity Lymphedema   30 cm Proximal to Floor at Lateral Plantar Foot 26.2 cm   20 cm Proximal to Floor at Lateral Plantar Foot 19.8 1   10  cm Proximal to Floor at Lateral Malleoli 18.9 cm   5 cm Proximal to 1st MTP Joint 19.1 cm   Across MTP Joint 21.2 cm   Around Proximal Great Toe 7 cm     Left Lower Extremity Lymphedema   30 cm Proximal to Floor at Lateral Plantar Foot 27.4 cm   20 cm Proximal to Floor at Lateral Plantar Foot 20.9 cm   10 cm Proximal to Floor  at Lateral Malleoli 19 cm   5 cm Proximal to 1st MTP Joint 19.8 cm   Across MTP Joint 21.8 cm   Around Proximal Great Toe 7 cm                  [facility] Adult PT Treatment/Exercise - 02/12/17 0001      Manual Therapy   Manual therapy comments Circumference measurements taken after removing bandage and washing LE's   Manual Lymphatic Drainage (MLD) In Supine: Short neck, superficial and deep abdominals, Rt axillary nodes, Rt inguino-axillary anastomosis, then Rt LE from dorsal foot to lateral thigh working from proximal to distal then retracing all steps. Then same on left side.   Compression Bandaging To Bil LE's: thick stockinette, Elastomull to toes 1-3, Artiflex x1, 1-8 cm (combined roman sandal and ASH technique with this bandage)  and 2-10 cm short stretch compression bandage from foot to knee. Pt. wore plastic slip-on sandals that functioned with the bandages.                   Short Term Clinic Goals - 01/24/17 1231      CC Short Term Goal  #1   Title Pt. will have a reduction of at least 0.5 cm. in circumferences of both legs at 10 cm. proximal to floor at lataeral malleoli   Baseline right 23.5 cm. and left 23.7 cm. at eval, right at 20 and left at 21 cm on 01/24/2017    Status Achieved     CC Short Term Goal  #2   Title reduction of at least 0.5 cm. at 5 cm. proximal to 1st MTP on both feet   Baseline 22.3 cm. on right and 22 cm. on left at eval, right at 20, left at 19.1 on 01/24/2017   Status Achieved             Long Term Clinic Goals - 01/31/17 1233      CC Long Term Goal  #2   Title Pt. will have been fitted for compression knee highs for both legs   Baseline Measurements taken 01/31/17   Status Partially Met            Plan - 02/12/17 1105    Clinical Impression Statement Pts circumferene measurements have reduced to where they were 2 weeks ago from being some increased last time measured. She reports bandages feeling comfortable and just awaiting arrival of compression garments at this time.    Rehab Potential Good   PT Frequency 3x / week   PT Duration 6 weeks   PT Treatment/Interventions ADLs/Self Care Home Management;DME Instruction;Gait training;Stair training;Therapeutic exercise;Balance training;Neuromuscular re-education;Patient/family education;Orthotic Fit/Training;Manual techniques;Manual lymph drainage;Compression bandaging;Passive range of motion   PT Next Visit Plan Gcode next visit. Continue complete decongestive therapy until garments are received and fitted.   Consulted and Agree with Plan of Care Patient      Patient will benefit from skilled therapeutic intervention in order to improve the following deficits and impairments:  Increased edema, Decreased  strength, Impaired flexibility, Difficulty walking  Visit Diagnosis: Localized edema     Problem List Patient Active Problem List   Diagnosis Date Noted  . Mitral valve prolapse -  severe MR 09/20/2016  . Aspiration pneumonia due to inhalation of vomitus ([place]) 09/12/2016  . Hypokalemia 09/12/2016  . Counseling regarding advanced care planning and goals of care   . Sepsis ([place]) 09/07/2016  . Nausea and vomiting 09/07/2016  . Ileus ([place]) 09/07/2016  .  Severe mitral regurgitation: Per 2 D echo 08/23/2016 08/23/2016  . Diffuse large B-cell lymphoma of lymph nodes of multiple regions ([place]) 08/22/2016  . B-cell lymphoma ([place])   . Renal cell adenocarcinoma ([place])   . History of peristent atrial fibrillation 08/20/2016  . Abdominal aortic atherosclerosis ([place]) - noted on CT scan 08/20/2016  . Severe protein-calorie malnutrition ([place])   . Hyponatremia 08/11/2016  . Normocytic normochromic anemia 08/11/2016  . Essential hypertension 08/11/2016  . Constipation 08/11/2016  . Hyperlipidemia 08/11/2016  . Abdominal pain 08/11/2016  . Renal mass, right 08/11/2016  . Adenopathy 08/11/2016  . Other intra-abdominal and pelvic swelling, mass and lump 08/11/2016  . Pain in left hip 06/05/2016    Otelia Limes, PTA 02/12/2017, 11:07 AM  [place], [place], [address] Phone: [phone]   Fax:  [phone]  Name: Christina Peterson MRN: [id number] Date of Birth: 27-Feb-1941

## 2017-02-13 ENCOUNTER — Encounter: Payer: Self-pay | Admitting: *Deleted

## 2017-02-13 ENCOUNTER — Ambulatory Visit
Admission: RE | Admit: 2017-02-13 | Discharge: 2017-02-13 | Disposition: A | Discharge status: Home / Self Care | Payer: Medicare Other | Source: Ambulatory Visit | Attending: Radiation Oncology | Admitting: Radiation Oncology

## 2017-02-13 DIAGNOSIS — Z510 Encounter for antineoplastic radiation therapy: Secondary | ICD-10-CM | POA: Diagnosis not present

## 2017-02-13 NOTE — Progress Notes (Signed)
[place] Work  Clinical Social Work completed portion of [employer] form on behalf of patient- provided remainder portion of form to medical oncology RN to complete and fax to [employer].   Maryjean Morn, MSW, LCSW, OSW-C Clinical Social Worker [facility] [phone]

## 2017-02-14 ENCOUNTER — Ambulatory Visit
Admission: RE | Admit: 2017-02-14 | Discharge: 2017-02-14 | Disposition: A | Discharge status: Home / Self Care | Payer: Medicare Other | Source: Ambulatory Visit | Attending: Radiation Oncology | Admitting: Radiation Oncology

## 2017-02-14 DIAGNOSIS — Z510 Encounter for antineoplastic radiation therapy: Secondary | ICD-10-CM | POA: Diagnosis not present

## 2017-02-17 ENCOUNTER — Ambulatory Visit
Admission: RE | Admit: 2017-02-17 | Discharge: 2017-02-17 | Disposition: A | Discharge status: Home / Self Care | Payer: Medicare Other | Source: Ambulatory Visit | Attending: Radiation Oncology | Admitting: Radiation Oncology

## 2017-02-17 ENCOUNTER — Encounter: Payer: Self-pay | Admitting: *Deleted

## 2017-02-17 DIAGNOSIS — Z510 Encounter for antineoplastic radiation therapy: Secondary | ICD-10-CM | POA: Diagnosis not present

## 2017-02-18 ENCOUNTER — Ambulatory Visit
Admission: RE | Admit: 2017-02-18 | Discharge: 2017-02-18 | Disposition: A | Discharge status: Home / Self Care | Payer: Medicare Other | Source: Ambulatory Visit | Attending: Radiation Oncology | Admitting: Radiation Oncology

## 2017-02-18 DIAGNOSIS — Z510 Encounter for antineoplastic radiation therapy: Secondary | ICD-10-CM | POA: Diagnosis not present

## 2017-02-19 ENCOUNTER — Ambulatory Visit
Admission: RE | Admit: 2017-02-19 | Discharge: 2017-02-19 | Disposition: A | Discharge status: Home / Self Care | Payer: Medicare Other | Source: Ambulatory Visit | Attending: Radiation Oncology | Admitting: Radiation Oncology

## 2017-02-19 DIAGNOSIS — Z510 Encounter for antineoplastic radiation therapy: Secondary | ICD-10-CM | POA: Diagnosis not present

## 2017-02-20 ENCOUNTER — Other Ambulatory Visit (HOSPITAL_COMMUNITY): Payer: Self-pay | Admitting: Family

## 2017-02-20 ENCOUNTER — Ambulatory Visit
Admission: RE | Admit: 2017-02-20 | Discharge: 2017-02-20 | Disposition: A | Discharge status: Home / Self Care | Payer: Medicare Other | Source: Ambulatory Visit | Attending: Radiation Oncology | Admitting: Radiation Oncology

## 2017-02-20 DIAGNOSIS — Z510 Encounter for antineoplastic radiation therapy: Secondary | ICD-10-CM | POA: Diagnosis not present

## 2017-02-20 DIAGNOSIS — R1310 Dysphagia, unspecified: Secondary | ICD-10-CM

## 2017-02-21 ENCOUNTER — Encounter: Payer: Self-pay | Admitting: Hematology

## 2017-02-21 ENCOUNTER — Other Ambulatory Visit (HOSPITAL_BASED_OUTPATIENT_CLINIC_OR_DEPARTMENT_OTHER): Payer: Medicare Other

## 2017-02-21 ENCOUNTER — Telehealth: Payer: Self-pay | Admitting: *Deleted

## 2017-02-21 ENCOUNTER — Ambulatory Visit
Admission: RE | Admit: 2017-02-21 | Discharge: 2017-02-21 | Disposition: A | Discharge status: Home / Self Care | Payer: Medicare Other | Source: Ambulatory Visit | Attending: Radiation Oncology | Admitting: Radiation Oncology

## 2017-02-21 ENCOUNTER — Ambulatory Visit (HOSPITAL_BASED_OUTPATIENT_CLINIC_OR_DEPARTMENT_OTHER): Payer: Medicare Other | Admitting: Hematology

## 2017-02-21 ENCOUNTER — Other Ambulatory Visit: Payer: Self-pay

## 2017-02-21 VITALS — BP 113/74 | HR 71 | Temp 97.6°F | Resp 20 | Ht 59.0 in | Wt 87.7 lb

## 2017-02-21 DIAGNOSIS — D696 Thrombocytopenia, unspecified: Secondary | ICD-10-CM

## 2017-02-21 DIAGNOSIS — R5383 Other fatigue: Secondary | ICD-10-CM | POA: Diagnosis not present

## 2017-02-21 DIAGNOSIS — D649 Anemia, unspecified: Secondary | ICD-10-CM

## 2017-02-21 DIAGNOSIS — C8338 Diffuse large B-cell lymphoma, lymph nodes of multiple sites: Secondary | ICD-10-CM | POA: Diagnosis present

## 2017-02-21 DIAGNOSIS — R627 Adult failure to thrive: Secondary | ICD-10-CM

## 2017-02-21 DIAGNOSIS — G893 Neoplasm related pain (acute) (chronic): Secondary | ICD-10-CM

## 2017-02-21 DIAGNOSIS — Z510 Encounter for antineoplastic radiation therapy: Secondary | ICD-10-CM | POA: Diagnosis not present

## 2017-02-21 DIAGNOSIS — R197 Diarrhea, unspecified: Secondary | ICD-10-CM | POA: Diagnosis not present

## 2017-02-21 DIAGNOSIS — E43 Unspecified severe protein-calorie malnutrition: Secondary | ICD-10-CM

## 2017-02-21 LAB — MANUAL DIFFERENTIAL
ALC: 0.4 10*3/uL — AB (ref 0.9–3.3)
ANC (CHCC MAN DIFF): 3.5 10*3/uL (ref 1.5–6.5)
Band Neutrophils: 17 % — ABNORMAL HIGH (ref 0–10)
LYMPH: 10 % — ABNORMAL LOW (ref 14–49)
METAMYELOCYTES PCT: 1 % — AB (ref 0–0)
MONO: 3 % (ref 0–14)
PLT EST: DECREASED
SEG: 69 % (ref 38–77)

## 2017-02-21 LAB — CBC WITH DIFFERENTIAL/PLATELET
HEMATOCRIT: 31.7 % — AB (ref 34.8–46.6)
HGB: 10.1 g/dL — ABNORMAL LOW (ref 11.6–15.9)
MCH: 26.4 pg (ref 25.1–34.0)
MCHC: 31.7 g/dL (ref 31.5–36.0)
MCV: 83.1 fL (ref 79.5–101.0)
PLATELETS: 106 10*3/uL — AB (ref 145–400)
RBC: 3.82 10*6/uL (ref 3.70–5.45)
RDW: 19.5 % — ABNORMAL HIGH (ref 11.2–14.5)
WBC: 4 10*3/uL (ref 3.9–10.3)

## 2017-02-21 LAB — COMPREHENSIVE METABOLIC PANEL
ALBUMIN: 3.2 g/dL — AB (ref 3.5–5.0)
ALT: 9 U/L (ref 0–55)
AST: 19 U/L (ref 5–34)
Alkaline Phosphatase: 64 U/L (ref 40–150)
Anion Gap: 8 mEq/L (ref 3–11)
BUN: 14.1 mg/dL (ref 7.0–26.0)
CHLORIDE: 101 meq/L (ref 98–109)
CO2: 28 meq/L (ref 22–29)
Calcium: 9.1 mg/dL (ref 8.4–10.4)
Creatinine: 0.6 mg/dL (ref 0.6–1.1)
EGFR: >60 mL/min/{1.73_m2} (ref >60)
GLUCOSE: 91 mg/dL (ref 70–140)
POTASSIUM: 3.5 meq/L (ref 3.5–5.1)
SODIUM: 138 meq/L (ref 136–145)
Total Bilirubin: 0.61 mg/dL (ref 0.20–1.20)
Total Protein: 5.8 g/dL — ABNORMAL LOW (ref 6.4–8.3)

## 2017-02-21 LAB — LACTATE DEHYDROGENASE: LDH: 466 U/L — ABNORMAL HIGH (ref 125–245)

## 2017-02-21 LAB — MAGNESIUM: Magnesium: 1.4 mg/dl — CL (ref 1.5–2.5)

## 2017-02-21 NOTE — Telephone Encounter (Signed)
Called and left vm, patient appt 1010 this am, asked to call if not coming 10:42 AM

## 2017-02-21 NOTE — Telephone Encounter (Signed)
Left vm, patient appt time 1010am, asked to call if not coming, let Melanie know linac#3 10:56 AM

## 2017-02-21 NOTE — Progress Notes (Signed)
[name] Kitchen    HEMATOLOGY/ONCOLOGY CLINIC NOTE  Date of Service: 12/23/16  Patient Care Team: Smothers, Andree Elk, NP as PCP - General (Nurse Practitioner)  CHIEF COMPLAINTS/PURPOSE OF CONSULTATION:  Post hospitalization f/u for DLBCL Renal cell carcinoma ?  HISTORY OF PRESENTING ILLNESS:  Christina Peterson is a wonderful 76 y.o. female who has been referred to [place] by Dr Cathlean Sauer, Jimmy Picket,*   for evaluation and management of newly diagnosed Large B cell lymphoma.  Patient has a history of hypertension, dyslipidemia, arthritis but notes that she lives independently in a senior housing facility and does all her ADLs herself.  She notes that she has been having increasing abdominal pain and mid back pain for about 3-4 weeks and has had some increased fatigue over the last couple of months. She notes that she has lost about 7-8 pounds over the last 2-3 weeks. She was recently admitted to the hospital from 08/10/2016-08/15/2016 with abdominal pain and poor oral intake. She was noted to be dehydrated with hyponatremia and CT scan of the abdomen on 08/12/2016 showed lymphadenopathy within the chest and abdomen concerning for lymphoma. She also was noted to have a right renal mass in the lower pole of the right kidney measuring 3.6 cm concerning for renal cell carcinoma.  Patient had a CT-guided biopsy of her retroperitoneal lymph node on 08/14/2016 which shows large B-cell lymphoma. She was to be seen in the oncology clinic but got readmitted on 08/21/2016 with worsening abdominal pain nausea and decreased appetite.  She was noted to be significantly hyponatremic again and is currently on IV fluids. She notes her abdominal pain is better controlled with pain medications she is receiving. No fevers or chills. I was consulted to help with further evaluation and management of her newly diagnosed large B-cell lymphoma.  Patient notes abdominal and back pain and about an 8-10 pound weight loss in the last few  weeks and inability to keep much food down due to pain and anorexia. She has been living by herself and demonstrated failure to thrive.  We discussed the diagnosis, natural history, prognosis and treatment options in detail including additional workup including a PET scan, need for a Port-A-Cath and different chemotherapy options possible adverse effects and limitations 2 doses of chemotherapy that can be used as a result of her age. Patient understands and is able to repeat back the understanding of these elements of care and would like to proceed with the most appropriate treatment.  She received her first cycle of R-mini CHOP on 08/26/2016 and had outpatient Neulasta shot since then.  INTERVAL HISTORY  Patient is here for her follow-up for her diffuse large B-cell lymphoma. She began radiation therapy on 02/10/17. She has undergone two weeks of treatment thus far. She has only had mild lower abdominal pain which has been intermittent. Her pain is not changed post-prandially or after bowel movements. She additionally reports that she has had more soft bowel movements and diarrhea since the onset of her pain as well. She quantifies the amount of her diarrhea at 2-3 times a day. In regards to her pain, she will sometimes take Oxycodone for this which controls her pain well. Her appetite has been spotty, but overall her daughter states that she has been eating better. She continues to take Marinol and dexamethasone, but she doesn't believe that this has improved her appetite issues much. She is sometimes more nauseated, but this has been manageable.  On review of systems, pt denies fever, chills, rash,  mouth sores, urinary complaints. Denies pain. Pt denies vomiting. She denies dizziness, lightheadedness, melena, hematochezia. Positive for that as above.   MEDICAL HISTORY:  Past Medical History:  Diagnosis Date  . Arthritis   . Diffuse large B-cell lymphoma of lymph nodes of multiple regions ([place])  08/22/2016  . Hypercholesteremia   . Hypertension    FOLLOWED BY DR Katherine Roan  . Mitral valve prolapse -  severe MR 09/20/2016   Posterior Leaflet  . Severe mitral regurgitation: Per 2 D echo 08/23/2016 08/23/2016   .posterior leaflet prolapse of mitral valve with anteriorly directed mitral regurgitation. Severe. Severely dilated left atrium.  . Severe protein-calorie malnutrition ([place])     SURGICAL HISTORY: Past Surgical History:  Procedure Laterality Date  . ABDOMINAL HYSTERECTOMY    . IR FLUORO GUIDE PORT INSERTION RIGHT  08/23/2016  . IR US GUIDE VASC ACCESS RIGHT  08/23/2016  . JOINT REPLACEMENT    . 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 TRANSTHORACIC ECHOCARDIOGRAM  08/23/2013    EF 60-65%. GR 2 DD.posterior leaflet prolapse of mitral valve with anteriorly directed mitral regurgitation. Severe. Severely dilated left atrium.    SOCIAL HISTORY: Social History   Socioeconomic History  . Marital status: Legally Separated    Spouse name: Not on file  . Number of children: 4  . Years of education: 43  . Highest education level: Not on file  Social Needs  . Financial resource strain: Not on file  . Food insecurity - worry: Not on file  . Food insecurity - inability: Not on file  . Transportation needs - medical: Not on file  . Transportation needs - non-medical: Not on file  Occupational History  . Not on file  Tobacco Use  . Smoking status: Never Smoker  . Smokeless tobacco: Never Used  Substance and Sexual Activity  . Alcohol use: No  . Drug use: No  . Sexual activity: Not on file  Other Topics Concern  . Not on file  Social History Narrative   She lives alone in an apartment.  She relies on family members for transportation & helping her figure out her medications.   She has listed married, but reportedly she is legally separated.   They have 4 children and 5 grandchildren and 5 great-grandchildren.   Has Social Security & DSS assistance with food stamps & food  banks.      FAMILY HISTORY: Family History  Problem Relation Age of Onset  . Heart disease Mother   . Hypertension Mother   . Heart attack Mother 10  . Hypertension Father   . Brain cancer Father   . Brain cancer Sister   . Hypertension Sister   . Stroke Sister   . Alcohol abuse Brother   . Depression Daughter   . Hypertension Daughter   . Thyroid disease Daughter   . Migraines Daughter     ALLERGIES:  has No Known Allergies.  MEDICATIONS:  Current Outpatient Medications  Medication Sig Dispense Refill  . dexamethasone (DECADRON) 2 MG tablet Take 1 tablet (2 mg total) by mouth daily with breakfast. 30 tablet 0  . docusate sodium (COLACE) 100 MG capsule Take 1 capsule (100 mg total) by mouth every 12 (twelve) hours. 60 capsule 1  . dronabinol (MARINOL) 2.5 MG capsule Take 1 capsule (2.5 mg total) by mouth 2 (two) times daily before a meal. 60 capsule 0  . feeding supplement, ENSURE ENLIVE, (ENSURE ENLIVE) LIQD Take 237 mLs by mouth 2 (  two) times daily between meals. 4500 mL 3  . hyaluronate sodium (RADIAPLEXRX) GEL Apply 1 application topically 2 (two) times daily. Apply after rad and bedtime to affected skin area, nothing 4 hours prior rad tx    . hydrOXYzine (ATARAX/VISTARIL) 25 MG tablet Take 25 mg by mouth 3 (three) times daily as needed.    . lidocaine-prilocaine (EMLA) cream Apply to port 1-2 hours before needle access. Cover with plastic wrap. 30 g 1  . magnesium oxide (MAG-OX) 400 (241.3 Mg) MG tablet Take 1 tablet (400 mg total) by mouth 2 (two) times daily. 60 tablet 0  . non-metallic deodorant (ALRA) MISC Apply 1 application topically daily.    . ondansetron (ZOFRAN) 8 MG tablet Take 1 tablet (8 mg total) by mouth every 8 (eight) hours as needed for nausea or vomiting. 30 tablet 0  . oxyCODONE (OXY IR/ROXICODONE) 5 MG immediate release tablet Take 1-2 tablets (5-10 mg total) by mouth every 4 (four) hours as needed for severe pain or breakthrough pain. 60 tablet 0  .  pantoprazole (PROTONIX) 20 MG tablet Take 1 tablet (20 mg total) by mouth daily. 30 tablet 0  . polyethylene glycol (MIRALAX / GLYCOLAX) packet Take 17 g by mouth daily. 28 each 1  . potassium chloride SA (K-DUR,KLOR-CON) 20 MEQ tablet Take 1 tablet (20 mEq total) by mouth 2 (two) times daily. 10 tablet 0  . predniSONE (DELTASONE) 20 MG tablet Take 3 tablets (60 mg total) by mouth See admin instructions. 60 mg po daily Day 1 to Day 5 of each chemotherapy cycle 30 tablet 3  . prochlorperazine (COMPAZINE) 10 MG tablet Take 1 tablet (10 mg total) by mouth every 6 (six) hours as needed for nausea or vomiting. 30 tablet 0  . senna-docusate (SENNA S) 8.6-50 MG tablet Take 2 tablets by mouth 2 (two) times daily. Reduce to 2tab po HS if diarrhea occurs 60 tablet 2  . sucralfate (CARAFATE) 1 g tablet Take 1 tablet (1 g total) by mouth 4 (four) times daily. 30 tablet 0   No current facility-administered medications for this visit.    Facility-Administered Medications Ordered in Other Visits  Medication Dose Route Frequency Provider Last Rate Last Dose  . ondansetron (ZOFRAN-ODT) disintegrating tablet 8 mg  8 mg Oral Once Brunetta Genera, MD        REVIEW OF SYSTEMS:    A 10+ POINT REVIEW OF SYSTEMS WAS OBTAINED including neurology, dermatology, psychiatry, cardiac, respiratory, lymph, extremities, GI, GU, Musculoskeletal, constitutional, breasts, reproductive, HEENT.  All pertinent positives are noted in the HPI.  All others are negative.  PHYSICAL EXAMINATION:  ECOG PERFORMANCE STATUS: 2 - Symptomatic, <50% confined to bed Vital signs reviewed in Epic . Wt Readings from Last 3 Encounters:  02/21/17 87 lb 11.2 oz (39.8 kg)  01/28/17 93 lb 3.2 oz (42.3 kg)  01/24/17 90 lb 11.2 oz (41.1 kg)   GENERAL:alert, in no acute distress and comfortable. Cachetic appearing. SKIN: no acute rashes, no significant lesions EYES: conjunctiva are pink and non-injected, sclera anicteric OROPHARYNX: MMM, no  exudates, no oropharyngeal erythema or ulceration NECK: supple, no JVD LYMPH:  no palpable cervical or axillary LNadenopathy. LUNGS: clear to auscultation b/l with normal respiratory effort HEART: regular rate & rhythm, 3/6 systolic murmur over the sternum and aortic area  ABDOMEN:  normoactive bowel sounds , non tender, not distended. Extremity: no pedal edema PSYCH: alert & oriented x 3 with fluent speech NEURO: no focal motor/sensory deficits   LABORATORY DATA:  I have reviewed the data as listed. CBC Latest Ref Rng & Units 02/21/2017 01/27/2017 01/24/2017  WBC 3.9 - 10.3 10e3/uL 4.0 10.5(H) 7.2  Hemoglobin 11.6 - 15.9 g/dL 10.1(L) 7.9(L) 7.8 repeat both analyzers(L)  Hematocrit 34.8 - 46.6 % 31.7(L) 24.6(L) 25.1(L)  Platelets 145 - 400 10e3/uL 106(L) 293 319   . CBC    Component Value Date/Time   WBC 4.0 02/21/2017 1035   WBC 20.9 (H) 09/12/2016 0333   RBC 3.82 02/21/2017 1035   RBC 3.39 (L) 09/12/2016 0333   HGB 10.1 (L) 02/21/2017 1035   HCT 31.7 (L) 02/21/2017 1035   PLT 106 (L) 02/21/2017 1035   MCV 83.1 02/21/2017 1035   MCH 26.4 02/21/2017 1035   MCH 28.3 09/12/2016 0333   MCHC 31.7 02/21/2017 1035   MCHC 32.7 09/12/2016 0333   RDW 19.5 (H) 02/21/2017 1035   LYMPHSABS 0.5 (L) 01/27/2017 1227   MONOABS 2.3 (H) 01/27/2017 1227   EOSABS 0.0 01/27/2017 1227   BASOSABS 0.1 01/27/2017 1227    . CMP Latest Ref Rng & Units 02/21/2017 01/24/2017 12/23/2016  Glucose 70 - 140 mg/dl 91 87 90  BUN 7.0 - 26.0 mg/dL 14.1 9.6 6.4(L)  Creatinine 0.6 - 1.1 mg/dL 0.6 0.7 0.7  Sodium 136 - 145 mEq/L 138 139 137  Potassium 3.5 - 5.1 mEq/L 3.5 4.6 4.3  Chloride 101 - 111 mmol/L - - -  CO2 22 - 29 mEq/L 28 27 27   Calcium 8.4 - 10.4 mg/dL 9.1 9.6 10.1  Total Protein 6.4 - 8.3 g/dL 5.8(L) 5.8(L) 6.2(L)  Total Bilirubin 0.20 - 1.20 mg/dL 0.61 0.35 0.53  Alkaline Phos 40 - 150 U/L 64 66 82  AST 5 - 34 U/L 19 17 18   ALT 0 - 55 U/L 9 <6 7      ------------------------------------------------------------------- LV EF: 60% - 65%  ------------------------------------------------------------------- Indications: (Chemo I74.9).  ------------------------------------------------------------------- History: PMH: No prior cardiac history. Risk factors: Dyslipidemia.  ------------------------------------------------------------------- Study Conclusions  - Left ventricle: The cavity size was normal. Wall thickness was normal. Systolic function was normal. The estimated ejection fraction was in the range of 60% to 65%. Features are consistent with a pseudonormal left ventricular filling pattern, with concomitant abnormal relaxation and increased filling pressure (grade 2 diastolic dysfunction). Doppler parameters are consistent with high ventricular filling pressure. - Aortic valve: There was trivial regurgitation. - Mitral valve: Prolapse of the posterior milral leaflet MR is anteriorly directed and wraps aound wall of LA MR is severe. - Left atrium: The atrium was severely dilated. - Right atrium: The atrium was mildly to moderately dilated. - Pulmonary arteries: PA peak pressure: 49 mm Hg (S).  RADIOGRAPHIC STUDIES: I have personally reviewed the radiological images as listed and agreed with the findings in the report. .Dg Op Swallowing Func-medicare/speech Path  Result Date: 02/06/2017 Objective Swallowing Evaluation: Type of Study: MBS-Modified Barium Swallow Study Patient Details Name: MARSIA CINO MRN: [id number] Date of Birth: Dec 11, 1940 Today's Date: 02/06/2017 Time: SLP Start Time (ACUTE ONLY): 1349-SLP Stop Time (ACUTE ONLY): 1415 SLP Time Calculation (min) (ACUTE ONLY): 26 min Past Medical History: Past Medical History: Diagnosis Date . Arthritis  . Diffuse large B-cell lymphoma of lymph nodes of multiple regions ([place]) 08/22/2016 . Hypercholesteremia  . Hypertension   FOLLOWED BY DR  Katherine Roan . Mitral valve prolapse -  severe MR 09/20/2016  Posterior Leaflet . Severe mitral regurgitation: Per 2 D echo 08/23/2016 08/23/2016  .posterior leaflet prolapse of mitral valve with anteriorly directed mitral regurgitation.  Severe. Severely dilated left atrium. . Severe protein-calorie malnutrition ([place])  Past Surgical History: Past Surgical History: Procedure Laterality Date . ABDOMINAL HYSTERECTOMY   . IR FLUORO GUIDE PORT INSERTION RIGHT  08/23/2016 . IR US GUIDE VASC ACCESS RIGHT  08/23/2016 . JOINT REPLACEMENT   . TOTAL HIP ARTHROPLASTY  2010  LEFT  . TOTAL HIP REVISION  05/02/2011  Procedure: TOTAL HIP REVISION;  Surgeon: Sharmon Revere, MD;  Location: [facility];  Service: Orthopedics;  Laterality: Left;  left total hip revision with bone grafting . TRANSTHORACIC ECHOCARDIOGRAM  08/23/2013   EF 60-65%. GR 2 DD.posterior leaflet prolapse of mitral valve with anteriorly directed mitral regurgitation. Severe. Severely dilated left atrium. HPI: pt is a 76 yo female referred for MBS.  Pt is undergoing treatment for B-cell lymphoma (chemo since Spring per pt) and to start XRT Monday.  She denies significant problems swallowing but does admit to occasional issues with sensing residuals in her esophagus.  Pt also has had recent aspiration pneumonia.  Daughters reports pt has to conduct effortful swallow and has difficulty chewing food due to dentition.  Significant weight loss reported which pt attributes to tast changes and lack of appetite - denies dysphagia as source.  Pt has not required heimlich manuever.  She admits to occasional coughing with food and reports having to throw her head back to swallow her pill.   Subjective: pt awake in chair, daughters Mateo Flow and Peter Congo present Assessment / Plan / Recommendation CHL IP CLINICAL IMPRESSIONS 02/06/2017 Clinical Impression Patient presents with mild dysphagia with NO aspiration or frank penetration observed.  Oral transiting discoordination resulted in oral  residuals with solids more than liquids and piecemealing.  Pt also extended head upward to aid oral transiting of barium tablet.  Pt conducts double swallows with puree/cracker but does require extra time for this 2nd swallow.  Flash penetration of thin observed - but only trace and WFL.  Suspect pt's primary dysphagia symptoms/aspiration risks are due to probable esophageal deficits (see radiologist note).  Of note, pt appeared with backflow to cervical esophagus with liquids during MBS without sensation.  At end of test - her esophagus was NOT clear.  Pt did admit to sensing residuals in her esophagus sometimes after meals  - stating this started since her chemotherapy and admits it has  improved some.  Recommend consider a dedicated esophagram to test further.  Educated pt and her daughters to findings and recommendations using video images and written instructions.  Advised frequent small meals during the day in lieu of large meals due to suspected poor esophageal clearance.   SLP Visit Diagnosis Dysphagia, oropharyngeal phase (R13.12) Attention and concentration deficit following -- Frontal lobe and executive function deficit following -- Impact on safety and function Moderate aspiration risk   No flowsheet data found.  No flowsheet data found. CHL IP DIET RECOMMENDATION 02/06/2017 SLP Diet Recommendations Regular solids;Thin liquid Liquid Administration via Cup;Straw Medication Administration Whole meds with liquid Compensations Slow rate;Small sips/bites Postural Changes Remain semi-upright after after feeds/meals (Comment);Seated upright at 90 degrees   CHL IP OTHER RECOMMENDATIONS 02/06/2017 Recommended Consults Consider esophageal assessment Oral Care Recommendations Oral care BID Other Recommendations --   No flowsheet data found.  No flowsheet data found.     CHL IP ORAL PHASE 02/06/2017 Oral Phase Impaired Oral - Pudding Teaspoon -- Oral - Pudding Cup -- Oral - Honey Teaspoon -- Oral - Honey Cup -- Oral  - Nectar Teaspoon -- Oral - Nectar Cup WFL;Piecemeal swallowing  Oral - Nectar Straw -- Oral - Thin Teaspoon WFL Oral - Thin Cup WFL;Piecemeal swallowing Oral - Thin Straw WFL;Piecemeal swallowing Oral - Puree Reduced posterior propulsion;Lingual/palatal residue Oral - Mech Soft -- Oral - Regular Impaired mastication;Reduced posterior propulsion;Lingual/palatal residue Oral - Multi-Consistency -- Oral - Pill Lingual pumping;Reduced posterior propulsion;Delayed oral transit;Decreased bolus cohesion;Premature spillage Oral Phase - Comment --  CHL IP PHARYNGEAL PHASE 02/06/2017 Pharyngeal Phase Impaired Pharyngeal- Pudding Teaspoon -- Pharyngeal -- Pharyngeal- Pudding Cup -- Pharyngeal -- Pharyngeal- Honey Teaspoon -- Pharyngeal -- Pharyngeal- Honey Cup -- Pharyngeal -- Pharyngeal- Nectar Teaspoon -- Pharyngeal -- Pharyngeal- Nectar Cup WFL Pharyngeal -- Pharyngeal- Nectar Straw -- Pharyngeal -- Pharyngeal- Thin Teaspoon WFL Pharyngeal -- Pharyngeal- Thin Cup Penetration/Aspiration during swallow Pharyngeal Material enters airway, remains ABOVE vocal cords then ejected out Pharyngeal- Thin Straw Penetration/Aspiration during swallow Pharyngeal Material enters airway, remains ABOVE vocal cords then ejected out Pharyngeal- Puree Reduced tongue base retraction;Pharyngeal residue - valleculae Pharyngeal -- Pharyngeal- Mechanical Soft -- Pharyngeal -- Pharyngeal- Regular Reduced tongue base retraction;Pharyngeal residue - valleculae Pharyngeal -- Pharyngeal- Multi-consistency -- Pharyngeal -- Pharyngeal- Pill WFL Pharyngeal -- Pharyngeal Comment --  CHL IP CERVICAL ESOPHAGEAL PHASE 02/06/2017 Cervical Esophageal Phase Impaired Pudding Teaspoon -- Pudding Cup -- Honey Teaspoon -- Honey Cup -- Nectar Teaspoon -- Nectar Cup -- Nectar Straw -- Thin Teaspoon -- Thin Cup -- Thin Straw -- Puree -- Mechanical Soft -- Regular -- Multi-consistency -- Pill -- Cervical Esophageal Comment please see radiologist note - after MBS, pt does  report sensation of sticking in her esophagus as primary dysphagia complaint which would be consistent with finding today-  CHL IP GO 02/06/2017 Functional Assessment Tool Used MBS Functional Limitations Swallowing Swallow Current Status (A6301) CI Swallow Goal Status (S0109) CI Swallow Discharge Status (N2355) CI Macario Golds 02/06/2017, 4:00 PM  Luanna Salk, MS [facility] SLP [phone]           CLINICAL DATA:  Dysphagia EXAM: MODIFIED BARIUM SWALLOW TECHNIQUE: Different consistencies of barium were administered orally to the patient by the Speech Pathologist. Imaging of the pharynx was performed in the lateral projection. FLUOROSCOPY TIME:  Fluoroscopy Time:  1 minutes 42 second Radiation Exposure Index (if provided by the fluoroscopic device): Number of Acquired Spot Images: 0 COMPARISON:  None. FINDINGS: Thin liquid- flash penetration without aspiration. Mild residual. Otherwise normal swallowing function Nectar thick liquid- within normal limits Honey- not administered Pure- within normal limits Cracker-not administered Pure with cracker- within normal limits Barium tablet - difficulty initiating swallowing ventrally the barium tablet was swallowed. 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 Distal esophagus not evaluated IMPRESSION: Flash penetration with thin barium.  No aspiration.  Mild retention. Please refer to the Speech Pathologists report for complete details and recommendations. Electronically Signed   By: Franchot Gallo M.D.   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 Patient has extensive retroperitoneal mesenteric and periportal lymphadenopathy along with adenopathy in the chest and supraclavicular areas and a focal lesion in the spleen.   Patient received first cycle of R-mini CHOP on 08/26/2016 and received outpatient neulasta. Patient received 2nd  cycle of  R-mini CHOP on 09/26/2016 and received outpatient neulasta. Received third cycle of R-mini CHOP on 09/26/2016 and received outpatient neulasta. Received third cycle of R-mini CHOP on 10/21/2016 and received outpatient neulasta. PET scan on 11/04/2016 which shows treatment response but still  with fairly active disease. C4 -anthracycline dose (doxorubicin 50 mg/m) to standard CHOP dosing and continue with dose reduced cyclophosphamide. C5 and C6 of R-CHOP with some dose as cycle 4. PET CT after 6 cycles of R-mini CHOP with Partial response. Plan I previously went through her PET scan in detail following her first line of R-mini CHOP chemotherapy. Her PET scan did show some shrinkage,  however, there was still evidence of a fair amount of disease within the abdomen. -Additionally, there was some nodal activity within lower left neck as well.  -she has received 2 weeks out of a planned 3 weeks of palliative RT to abdominal disease and left. -I also re-discussed with them how the PET scan did visualize a small tumor within the left kidney, this has remained mostly unchanged from prior but this may be lymphoma activity vs additional renal cancer.  --labs overall stable. Weight is fairly stable, has lost some weight recently. I encouraged her to continue to eat as much as she is able to tolerate and as she is able.  -we will see her back in 3-4 weeks with labs for toxicity management -will plan to rpt PET/CT in about 6 weeks post RT.  #2 Hypomagnesemia Mg 1.4  Plan Continue by mouth magnesium oxide replacement. -optimize po intake.  #3 Right lower pole of the kidney lesion ? Renal cell carcinoma versus involvement by lymphoma .  #4 significant fatigue and failure to thrive due to poor oral intake related lymphoma and type B constitutional symptoms. Plan -Continue to follow with [name]/dietitian to address her nutritional requirements. --Advised patient to continue nutrition supplement  Boost/ensure. -We will again refer her to PT for her fatigue  -I previously prescribed Marinol. She states this somewhat helped with her appetite but only for several days. I offered a refill of this, however, she does not want it at this time.   #5 neoplasm related pain currently well-controlled  Plan -Oxycodone 5-10 mg every 4 hours as needed for breakthrough pain  #6 severe mitral regurgitation normal ejection fraction -f/u with cardiology /PCP  #7 Symptomatic Anemia -hgb today stable at 10.1 after recent transfusion of 2 units of PRBCs. Hgb stable.   #8 Thrombocytopenia mild 106k -monitor with labs  RTC w/ Dr Irene Limbo in 3-4wk w/ lab   All of the patients questions were answered with apparent satisfaction. The patient knows to call the clinic with any problems, questions or concerns.  I spent 30 minutes counseling the patient face to face. The total time spent in the appointment was 40 minutes and more than 50% was on counseling and direct patient cares.    Sullivan Lone MD [place] AAHIV[facility] Hematology/Oncology Physician [facility]  (Office):       [phone] (Work cell):  [phone] (Fax):           [phone]  This document serves as a record of services personally performed by Sullivan Lone, MD. It was created on his behalf by Reola Mosher, a trained medical scribe. The creation of this record is based on the scribe's personal observations and the provider's statements to them.   .I have reviewed the above documentation for accuracy and completeness, and I agree with the above. Brunetta Genera MD

## 2017-02-24 ENCOUNTER — Other Ambulatory Visit: Payer: Self-pay | Admitting: *Deleted

## 2017-02-24 ENCOUNTER — Ambulatory Visit
Admission: RE | Admit: 2017-02-24 | Discharge: 2017-02-24 | Disposition: A | Discharge status: Home / Self Care | Payer: Medicare Other | Source: Ambulatory Visit | Attending: Radiation Oncology | Admitting: Radiation Oncology

## 2017-02-24 DIAGNOSIS — Z510 Encounter for antineoplastic radiation therapy: Secondary | ICD-10-CM | POA: Diagnosis not present

## 2017-02-25 ENCOUNTER — Ambulatory Visit
Admission: RE | Admit: 2017-02-25 | Discharge: 2017-02-25 | Disposition: A | Discharge status: Home / Self Care | Payer: Medicare Other | Source: Ambulatory Visit | Attending: Radiation Oncology | Admitting: Radiation Oncology

## 2017-02-25 ENCOUNTER — Other Ambulatory Visit: Payer: Self-pay | Admitting: *Deleted

## 2017-02-25 DIAGNOSIS — Z510 Encounter for antineoplastic radiation therapy: Secondary | ICD-10-CM | POA: Diagnosis not present

## 2017-02-25 DIAGNOSIS — C8338 Diffuse large B-cell lymphoma, lymph nodes of multiple sites: Secondary | ICD-10-CM

## 2017-02-25 MED ORDER — DEXAMETHASONE 2 MG PO TABS: 2.0000 mg | ORAL_TABLET | Freq: Every day | ORAL | 0 refills | Status: AC

## 2017-02-25 NOTE — Telephone Encounter (Signed)
error 

## 2017-02-26 ENCOUNTER — Ambulatory Visit
Admission: RE | Admit: 2017-02-26 | Discharge: 2017-02-26 | Disposition: A | Discharge status: Home / Self Care | Payer: Medicare Other | Source: Ambulatory Visit | Attending: Radiation Oncology | Admitting: Radiation Oncology

## 2017-02-26 DIAGNOSIS — Z510 Encounter for antineoplastic radiation therapy: Secondary | ICD-10-CM | POA: Diagnosis not present

## 2017-02-26 NOTE — Progress Notes (Signed)
  Radiation Oncology         [phone] ________________________________  Name: Christina Peterson MRN: [id number]  Date: 01/31/2017  DOB: August 03, 1940  SIMULATION AND TREATMENT PLANNING NOTE  DIAGNOSIS:     ICD-10-CM   1. 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  Left neck 2.  Abdomen  NARRATIVE:  The patient was brought to the [place].  Identity was confirmed.  All relevant records and images related to the planned course of therapy were reviewed.   Written consent to proceed with treatment was confirmed which was freely given after reviewing the details related to the planned course of therapy had been reviewed with the patient.  Then, the patient was set-up in a stable reproducible  supine position for radiation therapy.  CT images were obtained.  Surface markings were placed.    Medically necessary complex treatment device(s) for immobilization: Customized Vac-Lok bag and Accuform device.   The CT images were loaded into the planning software.  Then the target and avoidance structures were contoured.  Treatment planning then occurred.  The radiation prescription was entered and confirmed.  I have requested : Intensity Modulated Radiotherapy (IMRT) is medically necessary for this case for the following reason:  Small bowel sparing and and adequate sparing of the kidneys.   The patient will undergo daily image guidance to ensure accurate localization of the target, and adequate minimize dose to the normal surrounding structures in close proximity to the target.   PLAN:  The patient will receive 37.5 Gy in 15 fractions to both of the target regions.  ________________________________   Jodelle Gross, MD, PhD

## 2017-02-27 ENCOUNTER — Ambulatory Visit
Admission: RE | Admit: 2017-02-27 | Discharge: 2017-02-27 | Disposition: A | Discharge status: Home / Self Care | Payer: Medicare Other | Source: Ambulatory Visit | Attending: Radiation Oncology | Admitting: Radiation Oncology

## 2017-02-27 DIAGNOSIS — Z510 Encounter for antineoplastic radiation therapy: Secondary | ICD-10-CM | POA: Diagnosis not present

## 2017-02-28 ENCOUNTER — Ambulatory Visit
Admission: RE | Admit: 2017-02-28 | Discharge: 2017-02-28 | Disposition: A | Discharge status: Home / Self Care | Payer: Medicare Other | Source: Ambulatory Visit | Attending: Radiation Oncology | Admitting: Radiation Oncology

## 2017-02-28 ENCOUNTER — Encounter: Payer: Self-pay | Admitting: Radiation Oncology

## 2017-02-28 DIAGNOSIS — Z510 Encounter for antineoplastic radiation therapy: Secondary | ICD-10-CM | POA: Diagnosis not present

## 2017-02-28 NOTE — Telephone Encounter (Signed)
VIEWED SCHEDULE PATIENT CALLED AN MADE APPOINTMENT

## 2017-03-04 ENCOUNTER — Ambulatory Visit (HOSPITAL_COMMUNITY)
Admission: RE | Admit: 2017-03-04 | Discharge: 2017-03-04 | Disposition: A | Discharge status: Home / Self Care | Payer: Medicare Other | Source: Ambulatory Visit | Attending: Family | Admitting: Family

## 2017-03-04 DIAGNOSIS — K449 Diaphragmatic hernia without obstruction or gangrene: Secondary | ICD-10-CM | POA: Insufficient documentation

## 2017-03-04 DIAGNOSIS — R1310 Dysphagia, unspecified: Secondary | ICD-10-CM | POA: Diagnosis present

## 2017-03-04 NOTE — Progress Notes (Signed)
  Radiation Oncology         [phone] ________________________________  Name: Christina Peterson MRN: [id number]  Date: 02/28/2017  DOB: December 04, 1940  End of Treatment Note  Diagnosis:   Diffuse B Cell Lymphoma. The patient has persistent disease in the retroperitoneum, left supraclavicular region, and within the mesentery    ICD-10-CM   1. Diffuse large B-cell lymphoma of lymph nodes of multiple regions [facility]) C83.38     Indication for treatment:  Palliative       Radiation treatment dates:   02/10/2017 to 02/28/2017  Site/dose:    1. The abdomen was treated to 37.5 Gy in 15 fractions of 2.5 Gy. 2. The Left neck was treated to 37.5 Gy in 15 fractions of 2.5 Gy.  Beams/energy:    1. IMRT // 6X 2. photon // 15X, 6X  Narrative: The patient tolerated radiation treatment relatively well.  She experienced some nausea and stomach pain described as 6 on pain scale. She reports difficulty swallowing and feeling like food is getting stuck. She denies pain with swallowing. She feels that she has a lump in her chest. She also experienced moderate fatigue with treatment.   Plan: The patient has completed radiation treatment. The patient will return to radiation oncology clinic for routine followup in one month. I advised them to call or return sooner if they have any questions or concerns related to their recovery or treatment.  ------------------------------------------------  Jodelle Gross, MD, PhD  This document serves as a record of services personally performed by Kyung Rudd, MD. It was created on his behalf by Arlyce Harman, a trained medical scribe. The creation of this record is based on the scribe's personal observations and the provider's statements to them. This document has been checked and approved by the attending provider.

## 2017-03-05 ENCOUNTER — Encounter: Payer: Self-pay | Admitting: Cardiology

## 2017-03-05 ENCOUNTER — Ambulatory Visit (INDEPENDENT_AMBULATORY_CARE_PROVIDER_SITE_OTHER): Payer: Medicare Other | Admitting: Cardiology

## 2017-03-05 VITALS — BP 103/65 | HR 73 | Ht 59.0 in | Wt 86.6 lb

## 2017-03-05 DIAGNOSIS — I341 Nonrheumatic mitral (valve) prolapse: Secondary | ICD-10-CM

## 2017-03-05 DIAGNOSIS — I10 Essential (primary) hypertension: Secondary | ICD-10-CM | POA: Diagnosis not present

## 2017-03-05 DIAGNOSIS — E43 Unspecified severe protein-calorie malnutrition: Secondary | ICD-10-CM

## 2017-03-05 DIAGNOSIS — C8338 Diffuse large B-cell lymphoma, lymph nodes of multiple sites: Secondary | ICD-10-CM

## 2017-03-05 DIAGNOSIS — I340 Nonrheumatic mitral (valve) insufficiency: Secondary | ICD-10-CM | POA: Diagnosis not present

## 2017-03-05 NOTE — Patient Instructions (Signed)
Medication Instructions: Your physician recommends that you continue on your current medications as directed. Please refer to the Current Medication list given to you today.  If you need a refill on your cardiac medications before your next appointment, please call your pharmacy.    Follow-Up: Your physician wants you to follow-up in 3 months with Dr. Ellyn Hack.   Special Instructions: Please be aware of increasing shortness of breath, shortness of breath when lying down, and a rapid heart rate. Call the office at [phone]   Thank you for choosing Heartcare at [facility]!!

## 2017-03-05 NOTE — Progress Notes (Signed)
[phone] 

## 2017-03-05 NOTE — Progress Notes (Signed)
PCP: Christina Peterson, Christina Peterson, Christina Peterson  Oncologist: Dr. Irene Limbo.  Clinic Note: Chief Complaint  Patient presents with  . Follow-up    Mitral prolapse with MR    HPI: Christina Peterson is a 76 y.o. female who is being seen today for the evaluation of Mitral Regurgitation / Hospital f/u with Afib RVR at the request of Christina Peterson, Christina Peterson, Christina Peterson. She has diffuse large B-cell lymphoma -> she is currently undergoing chemotherapy -  While in the hospital in May. She has a Port-A-Cath in place.; Overall, acute by severe protein malnutrition   I saw her for initial consultation back in June 2018, we recheck an echocardiogram confirming severe mitral regurgitation.  She was relatively asymptomatic overall and the plan was to see her back in 6 months wh after she had her chemotherapy  Recent Hospitalizations: none since -- 09/06/2016 admitted for dehydration - with aspiration pneumonia.hospital course was complicated by ileus/bowel obstruction. Noted to be hyponatremic.  While in the hospital she had atrial fibrillation with RVR started on low-dose metoprolol.  Studies Personally Reviewed - (if available, images/films reviewed: From Epic Chart or Care Everywhere)  2-D echo 08/23/2016: EF 60-65%. GR 2 DD.posterior leaflet prolapse of mitral valve with anteriorly directed mitral regurgitation. Severe. Severely dilated left atrium.  2D echocardiogram 01/20/2017: Normal LV size and thickness.  EF 55-60%.  Mild AI.  Severe mitral regurgitation directed anteriorly.  (Did not comment on prolapse).  Moderate LA dilation.  Interval History:  Christina Peterson presents today with with I think her granddaughter.  She is extremely weak and sickly appearing having completed chemotherapy and radiation therapy.  She is extremely fatigued.  She tells me she is tired all the time.  She uses a walker simply because she is so weak.  She is finally starting to tolerate her boost meal supplements, but is lost a significant amount of weight.  She  denies any significant resting dyspnea or PND, orthopnea, just noticed it doing anything tires her out. She sleeps on some extra pillows more for comfort than anything else. She has significant amount of left-sided hip pain that limits her activities even more.   She has not had any episodes of rapid irregular heartbeats or palpitations.  No chest tightness or pressure symptoms. No syncope/near syncope or TIA/amaurosis fugax.   ROS: A comprehensive was performed. Review of Systems  Constitutional: Positive for malaise/fatigue and weight loss. Negative for chills and fever.       Walks with a walker. Not active. Very poor appetite  HENT: Positive for hearing loss. Negative for congestion and nosebleeds.   Eyes: Negative for blurred vision.  Respiratory: Positive for shortness of breath (When taking pills).   Gastrointestinal: Positive for abdominal pain. Negative for blood in stool (Blood on toilet paper but not in stool), constipation, melena, nausea and vomiting.  Genitourinary: Negative for hematuria.  Musculoskeletal: Positive for back pain and joint pain (Hip pain on left side). Negative for falls and myalgias.       Pain related to metastatic cancer in her back and abdomen.  Neurological: Positive for dizziness and weakness.  Endo/Heme/Allergies:       Both cold and hot intolerance  Psychiatric/Behavioral: Positive for memory loss. The patient has insomnia (Trouble sleeping).        Very flat affect. I am not sure if she has any dementia  All other systems reviewed and are negative.  I have reviewed and (if needed) personally updated the patient's problem list, medications, allergies, past  medical and surgical history, social and family history.   Past Medical History:  Diagnosis Date  . Arthritis   . Diffuse large B-cell lymphoma of lymph nodes of multiple regions ([place]) 08/22/2016  . Hypercholesteremia   . Hypertension    FOLLOWED BY DR Katherine Roan  . Mitral valve prolapse -   severe MR 09/20/2016   Posterior Leaflet  . Severe mitral regurgitation: Per 2 D echo 08/23/2016 08/23/2016   .posterior leaflet prolapse of mitral valve with anteriorly directed mitral regurgitation. Severe. Severely dilated left atrium.  . Severe protein-calorie malnutrition ([place])     Past Surgical History:  Procedure Laterality Date  . ABDOMINAL HYSTERECTOMY    . IR FLUORO GUIDE PORT INSERTION RIGHT  08/23/2016  . IR US GUIDE VASC ACCESS RIGHT  08/23/2016  . JOINT REPLACEMENT    . 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 TOTAL HIP REVISION  05/02/2011   Procedure: TOTAL HIP REVISION;  Surgeon: Sharmon Revere, MD;  Location: [facility];  Service: Orthopedics;  Laterality: Left;  left total hip revision with bone grafting  . TRANSTHORACIC ECHOCARDIOGRAM  08/23/2013    EF 60-65%. GR 2 DD.posterior leaflet prolapse of mitral valve with anteriorly directed mitral regurgitation. Severe. Severely dilated left atrium.    Current Meds  Medication Sig  . dexamethasone (DECADRON) 2 MG tablet Take 1 tablet (2 mg total) daily with breakfast by mouth.  . docusate sodium (COLACE) 100 MG capsule Take 1 capsule (100 mg total) by mouth every 12 (twelve) hours.  [name] Kitchen dronabinol (MARINOL) 2.5 MG capsule Take 1 capsule (2.5 mg total) by mouth 2 (two) times daily before a meal.  . hyaluronate sodium (RADIAPLEXRX) GEL Apply 1 application topically 2 (two) times daily. Apply after rad and bedtime to affected skin area, nothing 4 hours prior rad tx  . hydrOXYzine (ATARAX/VISTARIL) 25 MG tablet Take 25 mg by mouth 3 (three) times daily as needed.  . lidocaine-prilocaine (EMLA) cream Apply to port 1-2 hours before needle access. Cover with plastic wrap.  . magnesium oxide (MAG-OX) 400 (241.3 Mg) MG tablet Take 1 tablet (400 mg total) by mouth 2 (two) times daily.  . non-metallic deodorant Jethro Poling) MISC Apply 1 application topically daily.  . ondansetron (ZOFRAN) 8 MG tablet Take 1 tablet (8 mg total) by mouth every 8  (eight) hours as needed for nausea or vomiting.  . pantoprazole (PROTONIX) 20 MG tablet Take 1 tablet (20 mg total) by mouth daily.  . potassium chloride SA (K-DUR,KLOR-CON) 20 MEQ tablet Take 1 tablet (20 mEq total) by mouth 2 (two) times daily.  . predniSONE (DELTASONE) 20 MG tablet Take 3 tablets (60 mg total) by mouth See admin instructions. 60 mg po daily Day 1 to Day 5 of each chemotherapy cycle  . prochlorperazine (COMPAZINE) 10 MG tablet Take 1 tablet (10 mg total) by mouth every 6 (six) hours as needed for nausea or vomiting.  . senna-docusate (SENNA S) 8.6-50 MG tablet Take 2 tablets by mouth 2 (two) times daily. Reduce to 2tab po HS if diarrhea occurs  . sucralfate (CARAFATE) 1 g tablet Take 1 tablet (1 g total) by mouth 4 (four) times daily.  . [DISCONTINUED] oxyCODONE (OXY IR/ROXICODONE) 5 MG immediate release tablet Take 1-2 tablets (5-10 mg total) by mouth every 4 (four) hours as needed for severe pain or breakthrough pain.    No Known Allergies  Social History   Socioeconomic History  . Marital status: Legally Separated    Spouse  name: None  . Number of children: 4  . Years of education: 3  . Highest education level: None  Social Needs  . Financial resource strain: None  . Food insecurity - worry: None  . Food insecurity - inability: None  . Transportation needs - medical: None  . Transportation needs - non-medical: None  Occupational History  . None  Tobacco Use  . Smoking status: Never Smoker  . Smokeless tobacco: Never Used  Substance and Sexual Activity  . Alcohol use: No  . Drug use: No  . Sexual activity: None  Other Topics Concern  . None  Social History Narrative   She lives alone in an apartment.  She relies on family members for transportation & helping her figure out her medications.   She has listed married, but reportedly she is legally separated.   They have 4 children and 5 grandchildren and 5 great-grandchildren.   Has Social Security & DSS  assistance with food stamps & food banks.     Family History family history includes Alcohol abuse in her brother; Brain cancer in her father and sister; Depression in her daughter; Heart attack (age of onset: 66) in her mother; Heart disease in her mother; Hypertension in her daughter, father, mother, and sister; Migraines in her daughter; Stroke in her sister; Thyroid disease in her daughter.  Wt Readings from Last 3 Encounters:  03/05/17 86 lb 9.6 oz (39.3 kg)  02/21/17 87 lb 11.2 oz (39.8 kg)  01/28/17 93 lb 3.2 oz (42.3 kg)  09/2016 - 101 lb  (usual wgt 110-115 lb)   PHYSICAL EXAM BP 103/65   Pulse 73   Ht 4\' 11"  (1.499 m)   Wt 86 lb 9.6 oz (39.3 kg)   BMI 17.49 kg/m   Physical Exam  Constitutional: She is oriented to person, place, and time.  Very thin, frail elderly woman.  Cachectic with temporal wasting.  HENT:  Head: Normocephalic and atraumatic.  Mouth/Throat: No oropharyngeal exudate.  Eyes: EOM are normal. No scleral icterus.  Neck: Neck supple. No hepatojugular reflux and no JVD present. Carotid bruit is not present.  Cardiovascular: Normal rate, regular rhythm and intact distal pulses.  Occasional extrasystoles are present. PMI is not displaced. Exam reveals no gallop and no friction rub.  Murmur heard.  Low-pitched blowing decrescendo holosystolic murmur is present at the lower left sternal border and apex radiating to the apex. Pulmonary/Chest: Effort normal and breath sounds normal. No respiratory distress. She has no wheezes.  Weekend story effort.  Abdominal: Soft. Bowel sounds are normal. She exhibits no distension. There is no tenderness. There is no rebound.  Scaphoid abdomen  Musculoskeletal: Normal range of motion.  Neurological: She is alert and oriented to person, place, and time.  Subdued  Skin: Skin is warm and dry.  Skin tenting  Psychiatric: Judgment and thought content normal.  Blunted affect.       Adult ECG Report n/a  Other studies  Reviewed: Additional studies/ records that were reviewed today include:  Recent Labs:   Lab Results  Component Value Date   CREATININE 0.6 02/21/2017   BUN 14.1 02/21/2017   NA 138 02/21/2017   K 3.5 02/21/2017   CL 98 (L) 09/12/2016   CO2 28 02/21/2017   CBC Latest Ref Rng & Units 02/21/2017 01/27/2017 01/24/2017  WBC 3.9 - 10.3 10e3/uL 4.0 10.5(H) 7.2  Hemoglobin 11.6 - 15.9 g/dL 10.1(L) 7.9(L) 7.8 repeat both analyzers(L)  Hematocrit 34.8 - 46.6 % 31.7(L) 24.6(L) 25.1(L)  Platelets 145 - 400 10e3/uL 106(L) 293 319    ASSESSMENT / PLAN: Problem List Items Addressed This Visit    Diffuse large B-cell lymphoma of lymph nodes of multiple regions ([place]) (Chronic)   Essential hypertension (Chronic)    Borderline hypotensive again today he did not have any blood pressure room to consider afterload reduction to help with-mitral regurgitation.  In fact, the low blood pressure service to her benefit.      Mitral valve prolapse -  severe MR (Chronic)    Persistent mitral regurgitation, however repeat echo did not mention prolapse. Would eventually need a TEE to fully assess.  But for now she is not strong enough for TEE.      Severe mitral regurgitation: Per 2 D echo 08/23/2016 - Primary (Chronic)    Persistent severe mitral regurgitation likely related to mitral valve prolapse.  Blowing murmur on exam.  Unfortunately, I think she is still way too weak and malnourished to undergo further evaluation at this time.  She would need a TEE as well as a right left heart catheterization, but she needs to be much stronger to consider this. Currently with preserved EF and the current left ventricular end-systolic diameter, is not quite at the stage where she would absolutely require repair.  Question is is which she would be a surgical candidate, or we would we need to consider Mitral  Clip.  Will forward note to Dr. Roxy Manns for his opinion based on echo read.      Severe protein-calorie  malnutrition ([place]) (Chronic)    She is now able weaker and more frail than when I last saw her.  At this point she would not be a candidate most likely for any type of invasive surgical procedure to fix her valve.  I would like to see her strengthen up over the next several months and then reassess her symptomatology as well as stability for possible procedures.        Current medicines are reviewed at length with the patient today. (+/- concerns) n/a The following changes have been made: n/a  Patient Instructions  Medication Instructions: Your physician recommends that you continue on your current medications as directed. Please refer to the Current Medication list given to you today.  If you need a refill on your cardiac medications before your next appointment, please call your pharmacy.    Follow-Up: Your physician wants you to follow-up in 3 months with Dr. Ellyn Hack.   Special Instructions: Please be aware of increasing shortness of breath, shortness of breath when lying down, and a rapid heart rate. Call the office at [phone]   Thank you for choosing Heartcare at [facility]!!        Studies Ordered:   No orders of the defined types were placed in this encounter.     Glenetta Hew, M.D., M.S. Interventional Cardiologist   Pager # [phone] Phone # [phone] [address] [place], [place] [address]

## 2017-03-07 ENCOUNTER — Telehealth: Payer: Self-pay

## 2017-03-07 ENCOUNTER — Encounter: Payer: Self-pay | Admitting: Cardiology

## 2017-03-07 ENCOUNTER — Other Ambulatory Visit: Payer: Self-pay

## 2017-03-07 ENCOUNTER — Telehealth: Payer: Self-pay | Admitting: Hematology

## 2017-03-07 MED ORDER — OXYCODONE HCL 5 MG PO TABS: 5.0000 mg | ORAL_TABLET | ORAL | 0 refills | Status: AC | PRN

## 2017-03-07 NOTE — Telephone Encounter (Signed)
Spoke with pt daughter. Aware that oxycodone prescription is hard copy and will need to be p/u between the hours of 0800-1630 on Monday-Friday. Daughter will either come today or Monday to p/u prescription.

## 2017-03-07 NOTE — Assessment & Plan Note (Signed)
Borderline hypotensive again today he did not have any blood pressure room to consider afterload reduction to help with-mitral regurgitation.  In fact, the low blood pressure service to her benefit.

## 2017-03-07 NOTE — Assessment & Plan Note (Signed)
Persistent mitral regurgitation, however repeat echo did not mention prolapse. Would eventually need a TEE to fully assess.  But for now she is not strong enough for TEE.

## 2017-03-07 NOTE — Assessment & Plan Note (Signed)
Persistent severe mitral regurgitation likely related to mitral valve prolapse.  Blowing murmur on exam.  Unfortunately, I think she is still way too weak and malnourished to undergo further evaluation at this time.  She would need a TEE as well as a right left heart catheterization, but she needs to be much stronger to consider this. Currently with preserved EF and the current left ventricular end-systolic diameter, is not quite at the stage where she would absolutely require repair.  Question is is which she would be a surgical candidate, or we would we need to consider Mitral  Clip.  Will forward note to Dr. Roxy Manns for his opinion based on echo read.

## 2017-03-07 NOTE — Telephone Encounter (Signed)
Spoke with patient re lab/fu 12/10.   Per patient her dtr called this morning due to she needs a refill on her pain meds. Patient informed she I will give message to desk nurse. Left message for desk nurse re pain med refill.

## 2017-03-07 NOTE — Assessment & Plan Note (Signed)
She is now able weaker and more frail than when I last saw her.  At this point she would not be a candidate most likely for any type of invasive surgical procedure to fix her valve.  I would like to see her strengthen up over the next several months and then reassess her symptomatology as well as stability for possible procedures.

## 2017-03-08 ENCOUNTER — Encounter (HOSPITAL_COMMUNITY): Payer: Self-pay | Admitting: Nurse Practitioner

## 2017-03-08 ENCOUNTER — Emergency Department (HOSPITAL_COMMUNITY)
Admission: EM | Admit: 2017-03-08 | Discharge: 2017-03-08 | Disposition: A | Discharge status: Home / Self Care | Payer: Medicare Other | Attending: Emergency Medicine | Admitting: Emergency Medicine

## 2017-03-08 DIAGNOSIS — I10 Essential (primary) hypertension: Secondary | ICD-10-CM | POA: Diagnosis not present

## 2017-03-08 DIAGNOSIS — Z79899 Other long term (current) drug therapy: Secondary | ICD-10-CM | POA: Insufficient documentation

## 2017-03-08 DIAGNOSIS — R110 Nausea: Secondary | ICD-10-CM | POA: Diagnosis present

## 2017-03-08 DIAGNOSIS — Z96642 Presence of left artificial hip joint: Secondary | ICD-10-CM | POA: Insufficient documentation

## 2017-03-08 LAB — BASIC METABOLIC PANEL
Anion gap: 9 (ref 5–15)
BUN: 18 mg/dL (ref 6–20)
CALCIUM: 8.3 mg/dL — AB (ref 8.9–10.3)
CHLORIDE: 96 mmol/L — AB (ref 101–111)
CO2: 29 mmol/L (ref 22–32)
CREATININE: 0.47 mg/dL (ref 0.44–1.00)
GFR calc Af Amer: >60 mL/min (ref >60)
GFR calc non Af Amer: >60 mL/min (ref >60)
Glucose, Bld: 100 mg/dL — ABNORMAL HIGH (ref 65–99)
Potassium: 3.5 mmol/L (ref 3.5–5.1)
Sodium: 134 mmol/L — ABNORMAL LOW (ref 135–145)

## 2017-03-08 LAB — CBC WITH DIFFERENTIAL/PLATELET
BASOS PCT: 1 %
Basophils Absolute: 0 10*3/uL (ref 0.0–0.1)
EOS ABS: 0 10*3/uL (ref 0.0–0.7)
Eosinophils Relative: 0 %
HEMATOCRIT: 28.8 % — AB (ref 36.0–46.0)
HEMOGLOBIN: 9 g/dL — AB (ref 12.0–15.0)
LYMPHS ABS: 1.8 10*3/uL (ref 0.7–4.0)
Lymphocytes Relative: 57 %
MCH: 26.2 pg (ref 26.0–34.0)
MCHC: 31.3 g/dL (ref 30.0–36.0)
MCV: 84 fL (ref 78.0–100.0)
MONO ABS: 0.2 10*3/uL (ref 0.1–1.0)
MONOS PCT: 6 %
NEUTROS ABS: 1.1 10*3/uL — AB (ref 1.7–7.7)
NEUTROS PCT: 36 %
Platelets: 117 10*3/uL — ABNORMAL LOW (ref 150–400)
RBC: 3.43 MIL/uL — ABNORMAL LOW (ref 3.87–5.11)
RDW: 19.7 % — AB (ref 11.5–15.5)
WBC: 3.2 10*3/uL — ABNORMAL LOW (ref 4.0–10.5)

## 2017-03-08 MED ORDER — MORPHINE SULFATE (PF) 4 MG/ML IV SOLN
4.0000 mg | Freq: Once | INTRAVENOUS | Status: AC
  Administered 2017-03-08: 4 mg via INTRAVENOUS
  Filled 2017-03-08: qty 1

## 2017-03-08 MED ORDER — PROMETHAZINE HCL 25 MG RE SUPP: 25.0000 mg | Freq: Four times a day (QID) | RECTAL | 0 refills | Status: AC | PRN

## 2017-03-08 MED ORDER — SODIUM CHLORIDE 0.9 % IV BOLUS (SEPSIS)
2000.0000 mL | Freq: Once | INTRAVENOUS | Status: AC
  Administered 2017-03-08: 2000 mL via INTRAVENOUS

## 2017-03-08 MED ORDER — HEPARIN SOD (PORK) LOCK FLUSH 100 UNIT/ML IV SOLN
500.0000 [IU] | Freq: Once | INTRAVENOUS | Status: AC
  Administered 2017-03-08: 500 [IU]
  Filled 2017-03-08: qty 5

## 2017-03-08 MED ORDER — ONDANSETRON 4 MG PO TBDP: ORAL_TABLET | ORAL | 0 refills | Status: AC

## 2017-03-08 MED ORDER — ONDANSETRON HCL 4 MG/2ML IJ SOLN
4.0000 mg | Freq: Once | INTRAMUSCULAR | Status: AC
  Administered 2017-03-08: 4 mg via INTRAVENOUS
  Filled 2017-03-08: qty 2

## 2017-03-08 NOTE — Discharge Instructions (Signed)
Call your oncologist and let them know that this happened.  Return for worsening symptoms.

## 2017-03-08 NOTE — ED Triage Notes (Signed)
Pt is c/o nausea with an episode of emesis, fatigue, decreased appetite. He last radiation treatment was 8 days ago.

## 2017-03-08 NOTE — ED Notes (Signed)
Pt drank 120cc.   Pt had no problems.

## 2017-03-08 NOTE — ED Provider Notes (Signed)
[facility] DEPT Provider Note   CSN: [id number] Arrival date & time: 03/08/17  1638     History   Chief Complaint Chief Complaint  Patient presents with  . Nausea  . CA Pt    HPI Christina Peterson is a 76 y.o. female.  76 yo F with a chief complaint of nausea and weakness.  This is been a recurrent issue for her with her radiation therapy of her abdomen.  She has been having some chronic diarrhea.  Denies blood or dark stool.  Denies vomiting.  She has been spitting if she tries to eat.  Denies a sensation that food is stuck.  Denies fevers or chills.  Denies dysuria.  Has some chronic abdominal pain that is unchanged.   The history is provided by the patient.  Illness  This is a recurrent problem. The current episode started 2 days ago. The problem occurs constantly. The problem has been gradually worsening. Pertinent negatives include no chest pain, no headaches and no shortness of breath. Nothing aggravates the symptoms. Nothing relieves the symptoms. She has tried nothing for the symptoms. The treatment provided no relief.    Past Medical History:  Diagnosis Date  . Arthritis   . 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 Hypercholesteremia   . Hypertension    FOLLOWED BY DR Katherine Roan  . Mitral valve prolapse -  severe MR 09/20/2016   Posterior Leaflet  . Severe mitral regurgitation: Per 2 D echo 08/23/2016 08/23/2016   .posterior leaflet prolapse of mitral valve with anteriorly directed mitral regurgitation. Severe. Severely dilated left atrium.  . Severe protein-calorie malnutrition [facility])     Patient Active Problem List   Diagnosis Date Noted  . Mitral valve prolapse -  severe MR 09/20/2016  . Aspiration pneumonia due to inhalation of vomitus ([place]) 09/12/2016  . Hypokalemia 09/12/2016  . Counseling regarding advanced care planning and goals of care   . Sepsis ([place]) 09/07/2016  . Nausea and  vomiting 09/07/2016  . Ileus ([place]) 09/07/2016  . Severe mitral regurgitation: Per 2 D echo 08/23/2016 08/23/2016  . Diffuse large B-cell lymphoma of lymph nodes of multiple regions ([place]) 08/22/2016  . B-cell lymphoma ([place])   . Renal cell adenocarcinoma ([place])   . History of peristent atrial fibrillation 08/20/2016  . Abdominal aortic atherosclerosis ([place]) - noted on CT scan 08/20/2016  . Severe protein-calorie malnutrition ([place])   . Hyponatremia 08/11/2016  . Normocytic normochromic anemia 08/11/2016  . Essential hypertension 08/11/2016  . Constipation 08/11/2016  . Hyperlipidemia 08/11/2016  . Abdominal pain 08/11/2016  . Renal mass, right 08/11/2016  . Adenopathy 08/11/2016  . Other intra-abdominal and pelvic swelling, mass and lump 08/11/2016  . Pain in left hip 06/05/2016    Past Surgical History:  Procedure Laterality Date  . ABDOMINAL HYSTERECTOMY    . IR FLUORO GUIDE PORT INSERTION RIGHT  08/23/2016  . IR US GUIDE VASC ACCESS RIGHT  08/23/2016  . JOINT REPLACEMENT    . 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 TOTAL HIP REVISION  05/02/2011   Procedure: TOTAL HIP REVISION;  Surgeon: Sharmon Revere, MD;  Location: [facility];  Service: Orthopedics;  Laterality: Left;  left total hip revision with bone grafting  . TRANSTHORACIC ECHOCARDIOGRAM  08/23/2013    EF 60-65%. GR 2 DD.posterior leaflet prolapse of mitral valve with anteriorly directed mitral regurgitation. Severe. Severely dilated left atrium.    OB  History    No data available       Home Medications    Prior to Admission medications   Medication Sig Start Date End Date Taking? Authorizing Provider  dexamethasone (DECADRON) 2 MG tablet Take 1 tablet (2 mg total) daily with breakfast by mouth. 02/25/17   Brunetta Genera, MD  docusate sodium (COLACE) 100 MG capsule Take 1 capsule (100 mg total) by mouth every 12 (twelve) hours. 08/15/16   Barton Dubois, MD  dronabinol (MARINOL) 2.5 MG capsule Take 1 capsule (2.5 mg  total) by mouth 2 (two) times daily before a meal. 11/11/16   Brunetta Genera, MD  hyaluronate sodium (RADIAPLEXRX) GEL Apply 1 application topically 2 (two) times daily. Apply after rad and bedtime to affected skin area, nothing 4 hours prior rad tx    [provider]  hydrOXYzine (ATARAX/VISTARIL) 25 MG tablet Take 25 mg by mouth 3 (three) times daily as needed.    Bartholome Bill, MD  lidocaine-prilocaine (EMLA) cream Apply to port 1-2 hours before needle access. Cover with plastic wrap. 11/06/16   Brunetta Genera, MD  magnesium oxide (MAG-OX) 400 (241.3 Mg) MG tablet Take 1 tablet (400 mg total) by mouth 2 (two) times daily. 02/10/17   Brunetta Genera, MD  non-metallic deodorant Jethro Poling) MISC Apply 1 application topically daily.    [provider]  ondansetron (ZOFRAN ODT) 4 MG disintegrating tablet 4mg  ODT q4 hours prn nausea/vomit 03/08/17   Deno Etienne, DO  ondansetron (ZOFRAN) 8 MG tablet Take 1 tablet (8 mg total) by mouth every 8 (eight) hours as needed for nausea or vomiting. 12/23/16   Brunetta Genera, MD  oxyCODONE (OXY IR/ROXICODONE) 5 MG immediate release tablet Take 1-2 tablets (5-10 mg total) by mouth every 4 (four) hours as needed for severe pain or breakthrough pain. 03/07/17   Ardath Sax, MD  pantoprazole (PROTONIX) 20 MG tablet Take 1 tablet (20 mg total) by mouth daily. 07/19/16   Lacretia Leigh, MD  potassium chloride SA (K-DUR,KLOR-CON) 20 MEQ tablet Take 1 tablet (20 mEq total) by mouth 2 (two) times daily. 09/12/16   Debbe Odea, MD  predniSONE (DELTASONE) 20 MG tablet Take 3 tablets (60 mg total) by mouth See admin instructions. 60 mg po daily Day 1 to Day 5 of each chemotherapy cycle 08/28/16   Brunetta Genera, MD  prochlorperazine (COMPAZINE) 10 MG tablet Take 1 tablet (10 mg total) by mouth every 6 (six) hours as needed for nausea or vomiting. 12/23/16   Brunetta Genera, MD  promethazine (PHENERGAN) 25 MG suppository Place  1 suppository (25 mg total) rectally every 6 (six) hours as needed for nausea or vomiting. 03/08/17   Deno Etienne, DO  senna-docusate (SENNA S) 8.6-50 MG tablet Take 2 tablets by mouth 2 (two) times daily. Reduce to 2tab po HS if diarrhea occurs 09/02/16   Brunetta Genera, MD  sucralfate (CARAFATE) 1 g tablet Take 1 tablet (1 g total) by mouth 4 (four) times daily. 07/19/16   Lacretia Leigh, MD    Family History Family History  Problem Relation Age of Onset  . Heart disease Mother   . Hypertension Mother   . Heart attack Mother 65  . Hypertension Father   . Brain cancer Father   . Brain cancer Sister   . Hypertension Sister   . Stroke Sister   . Alcohol abuse Brother   . Depression Daughter   . Hypertension Daughter   . Thyroid disease  Daughter   . Migraines Daughter     Social History Social History   Tobacco Use  . Smoking status: Never Smoker  . Smokeless tobacco: Never Used  Substance Use Topics  . Alcohol use: No  . Drug use: No     Allergies   Patient has no known allergies.   Review of Systems Review of Systems  Constitutional: Negative for chills and fever.  HENT: Negative for congestion and rhinorrhea.   Eyes: Negative for redness and visual disturbance.  Respiratory: Negative for shortness of breath and wheezing.   Cardiovascular: Negative for chest pain and palpitations.  Gastrointestinal: Positive for nausea. Negative for vomiting.  Genitourinary: Negative for dysuria and urgency.  Musculoskeletal: Negative for arthralgias and myalgias.  Skin: Negative for pallor and wound.  Neurological: Positive for weakness. Negative for dizziness and headaches.     Physical Exam Updated Vital Signs BP 122/64   Pulse 65   Temp (!) 97.5 F (36.4 C) (Oral)   Ht 4\' 11"  (1.499 m)   Wt 38.1 kg (84 lb)   SpO2 100%   BMI 16.97 kg/m   Physical Exam  Constitutional: She is oriented to person, place, and time. She appears cachectic. No distress.  HENT:  Head:  Normocephalic and atraumatic.  Eyes: EOM are normal. Pupils are equal, round, and reactive to light.  Neck: Normal range of motion. Neck supple.  Cardiovascular: Normal rate and regular rhythm. Exam reveals no gallop and no friction rub.  No murmur heard. Pulmonary/Chest: Effort normal. She has no wheezes. She has no rales.  Abdominal: Soft. She exhibits no distension and no mass. There is no tenderness. There is no guarding.  Musculoskeletal: She exhibits no edema or tenderness.  Neurological: She is alert and oriented to person, place, and time.  Skin: Skin is warm and dry. She is not diaphoretic.  Psychiatric: She has a normal mood and affect. Her behavior is normal.  Nursing note and vitals reviewed.    ED Treatments / Results  Labs (all labs ordered are listed, but only abnormal results are displayed) Labs Reviewed  CBC WITH DIFFERENTIAL/PLATELET - Abnormal; Notable for the following components:      Result Value   WBC 3.2 (*)    RBC 3.43 (*)    Hemoglobin 9.0 (*)    HCT 28.8 (*)    RDW 19.7 (*)    Platelets 117 (*)    Neutro Abs 1.1 (*)    All other components within normal limits  BASIC METABOLIC PANEL - Abnormal; Notable for the following components:   Sodium 134 (*)    Chloride 96 (*)    Glucose, Bld 100 (*)    Calcium 8.3 (*)    All other components within normal limits    EKG  EKG Interpretation None       Radiology No results found.  Procedures Procedures (including critical care time)  Medications Ordered in ED Medications  sodium chloride 0.9 % bolus 2,000 mL (2,000 mLs Intravenous New Bag/Given 03/08/17 1905)  ondansetron (ZOFRAN) injection 4 mg (4 mg Intravenous Given 03/08/17 1906)  morphine 4 MG/ML injection 4 mg (4 mg Intravenous Given 03/08/17 1929)     Initial Impression / Assessment and Plan / ED Course  I have reviewed the triage vital signs and the nursing notes.  Pertinent labs & imaging results that were available during my care  of the patient were reviewed by me and considered in my medical decision making (see chart for details).  76 yo F with a chief complaint of nausea.  The patient is getting radiation therapy for lymphoma in the abdomen.  She has had recurrent bouts of nausea with this.  Has tried her medications but is unable to keep them down.  Will give IV fluids check labs and reassess.  The patient is feeling much better.  Is requesting discharge home.  8:32 PM:  I have discussed the diagnosis/risks/treatment options with the patient and family and believe the pt to be eligible for discharge home to follow-up with PCP, oncology. We also discussed returning to the ED immediately if new or worsening sx occur. We discussed the sx which are most concerning (e.g., sudden worsening pain, fever, inability to tolerate by mouth) that necessitate immediate return. Medications administered to the patient during their visit and any new prescriptions provided to the patient are listed below.  Medications given during this visit Medications  sodium chloride 0.9 % bolus 2,000 mL (2,000 mLs Intravenous New Bag/Given 03/08/17 1905)  ondansetron (ZOFRAN) injection 4 mg (4 mg Intravenous Given 03/08/17 1906)  morphine 4 MG/ML injection 4 mg (4 mg Intravenous Given 03/08/17 1929)     The patient appears reasonably screen and/or stabilized for discharge and I doubt any other medical condition or other [facility] requiring further screening, evaluation, or treatment in the ED at this time prior to discharge.    Final Clinical Impressions(s) / ED Diagnoses   Final diagnoses:  Nausea    ED Discharge Orders        Ordered    ondansetron (ZOFRAN ODT) 4 MG disintegrating tablet     03/08/17 2030    promethazine (PHENERGAN) 25 MG suppository  Every 6 hours PRN     03/08/17 2030       Deno Etienne, DO 03/08/17 2032

## 2017-03-19 ENCOUNTER — Other Ambulatory Visit: Payer: Self-pay

## 2017-03-19 ENCOUNTER — Encounter: Payer: Self-pay | Admitting: Physical Therapy

## 2017-03-19 ENCOUNTER — Ambulatory Visit: Payer: Medicare Other | Attending: Hematology | Admitting: Physical Therapy

## 2017-03-19 DIAGNOSIS — R2689 Other abnormalities of gait and mobility: Secondary | ICD-10-CM | POA: Diagnosis present

## 2017-03-19 DIAGNOSIS — R600 Localized edema: Secondary | ICD-10-CM | POA: Diagnosis not present

## 2017-03-19 DIAGNOSIS — M6281 Muscle weakness (generalized): Secondary | ICD-10-CM | POA: Diagnosis present

## 2017-03-19 NOTE — Therapy (Signed)
[place], [place], [address] Phone: [phone]   Fax:  [phone]  Physical Therapy Re-Evaluation  Patient Details  Name: Christina Peterson MRN: [id number] Date of Birth: May 07, 1940 Referring Provider: Dr. Irene Limbo    Encounter Date: 03/19/2017  PT End of Session - 03/19/17 1251    Visit Number  10    Number of Visits  27    Date for PT Re-Evaluation  04/19/17    PT Start Time  1105    PT Stop Time  1210    PT Time Calculation (min)  65 min    Activity Tolerance  Patient tolerated treatment well    Behavior During Therapy  [facility] for tasks assessed/performed       Past Medical History:  Diagnosis Date  . Arthritis   . Diffuse large B-cell lymphoma of lymph nodes of multiple regions ([place]) 08/22/2016  . Hypercholesteremia   . Hypertension    FOLLOWED BY DR Katherine Roan  . Mitral valve prolapse -  severe MR 09/20/2016   Posterior Leaflet  . Severe mitral regurgitation: Per 2 D echo 08/23/2016 08/23/2016   .posterior leaflet prolapse of mitral valve with anteriorly directed mitral regurgitation. Severe. Severely dilated left atrium.  . Severe protein-calorie malnutrition ([place])     Past Surgical History:  Procedure Laterality Date  . ABDOMINAL HYSTERECTOMY    . IR FLUORO GUIDE PORT INSERTION RIGHT  08/23/2016  . IR US GUIDE VASC ACCESS RIGHT  08/23/2016  . JOINT REPLACEMENT    . 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 TOTAL HIP REVISION  05/02/2011   Procedure: TOTAL HIP REVISION;  Surgeon: Sharmon Revere, MD;  Location: [facility];  Service: Orthopedics;  Laterality: Left;  left total hip revision with bone grafting  . TRANSTHORACIC ECHOCARDIOGRAM  08/23/2013    EF 60-65%. GR 2 DD.posterior leaflet prolapse of mitral valve with anteriorly directed mitral regurgitation. Severe. Severely dilated left atrium.    There were no vitals filed for this visit.   Subjective Assessment - 03/19/17 1114    Subjective   Pt comes back to PT for Re-evaluation per referral from Dr. Irene Limbo to address deconditioning and fatigue. She has her compression  garments but has not worn since last week.  She says she cannot put them on herself and her children do not help her put them on.     Pertinent History  Diagnosis is large B cell lymphoma. She has finished chemotherapy for now. Just had a CAT scan today to check on status. h/o left total hip replacement in 2010. Arthritis, especially in hands.    Patient Stated Goals  pt states she wants to get stronger     Currently in Pain?  No/denies         [facility] PT Assessment - 03/19/17 0001      Assessment   Medical Diagnosis  large B cell lymphoma    Referring Provider  Dr. Irene Limbo     Onset Date/Surgical Date  07/28/16 approx.      Prior Function   Level of Independence  Needs assistance with homemaking;Independent with household mobility with device;Independent with basic ADLs      Sit to Stand   Comments  8 repetitions of sit to stand in 30 seconds       Strength   Right Hand Grip (lbs)  20/18/18    Left Hand Grip (lbs)  25/23/21      Ambulation/Gait  Gait Comments  7.30 sec for 5 meters walking without a device       Timed Up and Go Test   TUG  Normal TUG    Normal TUG (seconds)  16.68        LYMPHEDEMA/ONCOLOGY QUESTIONNAIRE - 03/19/17 1126      Right Lower Extremity Lymphedema   At Midpatella/Popliteal Crease  37 cm    30 cm Proximal to Floor at Lateral Plantar Foot  33.5 cm    20 cm Proximal to Floor at Lateral Plantar Foot  25 1    10  cm Proximal to Floor at Lateral Malleoli  21.4 cm    5 cm Proximal to 1st MTP Joint  22 cm    Across MTP Joint  21 cm    Around Proximal Great Toe  7 cm      Left Lower Extremity Lymphedema   At Midpatella/Popliteal Crease  39 cm    30 cm Proximal to Floor at Lateral Plantar Foot  34 cm    20 cm Proximal to Floor at Lateral Plantar Foot  26.6 cm    10 cm Proximal to Floor at Lateral Malleoli  21 cm    5 cm  Proximal to 1st MTP Joint  22.5 cm    Across MTP Joint  21.1 cm    Around Proximal Great Toe  7 cm          Objective measurements completed on examination: See above findings.      [facility] Adult PT Treatment/Exercise - 03/19/17 0001      Self-Care   Self-Care  Other Self-Care Comments    Other Self-Care Comments   Pt brought her Circaids in today , but neede to have re-education about how to apply garments.  2 daughters were here for education and were able to reapply the garments. One daiughter videotaped it on her phone so they will able to continue to apply it at home              PT Education - 03/19/17 1250    Education provided  Yes    Education Details  application of CircAiDs for lower leg and foot on each leg     Person(s) Educated  Patient    Methods  Explanation;Demonstration    Comprehension  Verbalized understanding;Returned demonstration        Short Term Clinic Goals - 01/24/17 1231      CC Short Term Goal  #1   Title  Pt. will have a reduction of at least 0.5 cm. in circumferences of both legs at 10 cm. proximal to floor at lataeral malleoli    Baseline  right 23.5 cm. and left 23.7 cm. at eval, right at 20 and left at 21 cm on 01/24/2017     Status  Achieved      CC Short Term Goal  #2   Title  reduction of at least 0.5 cm. at 5 cm. proximal to 1st MTP on both feet    Baseline  22.3 cm. on right and 22 cm. on left at eval, right at 20, left at 19.1 on 01/24/2017    Status  Achieved          Long Term Clinic Goals - 03/19/17 1259      CC Long Term Goal  #1   Title  Increase gait velocity by 2 seconds indicating an increase in function and balance so she will be safer in gait at home  Time  4    Period  Weeks    Status  New      CC Long Term Goal  #2   Title  Pt. will have been fitted for compression knee highs for both legs    Baseline  Measurements taken 01/31/17, waiting for flat knit stockings and another pair of Circaids     Time   4    Status  Partially Met      CC Long Term Goal  #3   Title  Pt. will be knowledgeable about self-care for swelling    Time  4    Status  On-going      CC Long Term Goal  #4   Title  Pt will be independent in a home exercise program     Time  4    Period  Weeks    Status  New      CC Long Term Goal  #5   Title  Pt will have decrease in TUG by 2 sec indicting a decreased risk for falls     Time  4    Period  Weeks    Status  New          Plan - 03/19/17 1304    Clinical Impression Statement  Pt returns to PT for treatment of deconditioning. She also brings in her CircAids which she has not worn this week. She has increased swelling in both her legs up above knees. Patient and daughters were educted in how to apply her Circaid garments and they were able to demonstrate the application and videotaped it on their personal phone so they can follow through at home.  Pt will benefit form PT for exercise to improve functional status as well as continue monitoring of lymphedema and use of compression garments for managment at home     Rehab Potential  Good    Clinical Impairments Affecting Rehab Potential  previous chemotherapy     PT Frequency  2x / week    PT Duration  4 weeks    PT Treatment/Interventions  ADLs/Self Care Home Management;DME Instruction;Gait training;Stair training;Therapeutic exercise;Balance training;Neuromuscular re-education;Patient/family education;Orthotic Fit/Training;Manual techniques;Manual lymph drainage;Compression bandaging;Passive range of motion    PT Next Visit Plan  teach HEP For home for LE strength and balance. progress exercise as indicated.  Monitor lymphedema and use of compression     Consulted and Agree with Plan of Care  Patient;Family member/caregiver    Family Member Consulted  2 daughters        Patient will benefit from skilled therapeutic intervention in order to improve the following deficits and impairments:  Increased edema, Decreased  strength, Impaired flexibility, Difficulty walking, Impaired perceived functional ability, Postural dysfunction  Visit Diagnosis: Localized edema - Plan: PT plan of care cert/re-cert, CANCELED: PT plan of care cert/re-cert  Muscle weakness (generalized) - Plan: PT plan of care cert/re-cert, CANCELED: PT plan of care cert/re-cert  Other abnormalities of gait and mobility - Plan: PT plan of care cert/re-cert, CANCELED: PT plan of care cert/re-cert  G-Codes - 82/42/35 1252    Functional Assessment Tool Used (Outpatient Only)  clinical judgement    Functional Limitation  Self care    Self Care Current Status (T6144)  At least 60 percent but less than 80 percent impaired, limited or restricted    Self Care Goal Status (R1540)  At least 20 percent but less than 40 percent impaired, limited or restricted        Problem  List Patient Active Problem List   Diagnosis Date Noted  . Mitral valve prolapse -  severe MR 09/20/2016  . Aspiration pneumonia due to inhalation of vomitus ([place]) 09/12/2016  . Hypokalemia 09/12/2016  . Counseling regarding advanced care planning and goals of care   . Sepsis ([place]) 09/07/2016  . Nausea and vomiting 09/07/2016  . Ileus ([place]) 09/07/2016  . Severe mitral regurgitation: Per 2 D echo 08/23/2016 08/23/2016  . Diffuse large B-cell lymphoma of lymph nodes of multiple regions ([place]) 08/22/2016  . B-cell lymphoma ([place])   . Renal cell adenocarcinoma ([place])   . History of peristent atrial fibrillation 08/20/2016  . Abdominal aortic atherosclerosis ([place]) - noted on CT scan 08/20/2016  . Severe protein-calorie malnutrition ([place])   . Hyponatremia 08/11/2016  . Normocytic normochromic anemia 08/11/2016  . Essential hypertension 08/11/2016  . Constipation 08/11/2016  . Hyperlipidemia 08/11/2016  . Abdominal pain 08/11/2016  . Renal mass, right 08/11/2016  . Adenopathy 08/11/2016  . Other intra-abdominal and pelvic swelling, mass and lump 08/11/2016  . Pain in left hip  06/05/2016   Donato Heinz. Owens Shark PT  Norwood Levo 03/19/2017, 4:06 PM  [place], [place], [address] Phone: [phone]   Fax:  [phone]  Name: Christina Peterson MRN: [id number] Date of Birth: Apr 11, 1941

## 2017-03-21 ENCOUNTER — Telehealth: Payer: Self-pay | Admitting: *Deleted

## 2017-03-21 NOTE — Telephone Encounter (Signed)
Sw pt's daughter Peter [place] regarding upcoming inclement weather.  Pt wishes to reschedule apt.  High priority message sent to reschedule apts.

## 2017-03-24 ENCOUNTER — Other Ambulatory Visit: Payer: Medicare Other

## 2017-03-24 ENCOUNTER — Ambulatory Visit: Payer: Medicare Other | Admitting: Hematology

## 2017-03-25 ENCOUNTER — Encounter (HOSPITAL_COMMUNITY): Payer: Self-pay

## 2017-03-25 ENCOUNTER — Other Ambulatory Visit: Payer: Self-pay

## 2017-03-25 ENCOUNTER — Inpatient Hospital Stay (HOSPITAL_COMMUNITY)
Admission: EM | Admit: 2017-03-25 | Discharge: 2017-04-15 | DRG: 840 | Disposition: E | Payer: Medicare Other | Attending: Internal Medicine | Admitting: Internal Medicine

## 2017-03-25 ENCOUNTER — Emergency Department (HOSPITAL_COMMUNITY): Payer: Medicare Other

## 2017-03-25 DIAGNOSIS — E43 Unspecified severe protein-calorie malnutrition: Secondary | ICD-10-CM | POA: Diagnosis present

## 2017-03-25 DIAGNOSIS — C8339 Diffuse large B-cell lymphoma, extranodal and solid organ sites: Secondary | ICD-10-CM | POA: Diagnosis not present

## 2017-03-25 DIAGNOSIS — T451X5A Adverse effect of antineoplastic and immunosuppressive drugs, initial encounter: Secondary | ICD-10-CM | POA: Diagnosis present

## 2017-03-25 DIAGNOSIS — Z9221 Personal history of antineoplastic chemotherapy: Secondary | ICD-10-CM

## 2017-03-25 DIAGNOSIS — E785 Hyperlipidemia, unspecified: Secondary | ICD-10-CM | POA: Diagnosis present

## 2017-03-25 DIAGNOSIS — K651 Peritoneal abscess: Secondary | ICD-10-CM | POA: Diagnosis present

## 2017-03-25 DIAGNOSIS — G893 Neoplasm related pain (acute) (chronic): Secondary | ICD-10-CM | POA: Diagnosis not present

## 2017-03-25 DIAGNOSIS — I959 Hypotension, unspecified: Secondary | ICD-10-CM | POA: Diagnosis not present

## 2017-03-25 DIAGNOSIS — R64 Cachexia: Secondary | ICD-10-CM | POA: Diagnosis present

## 2017-03-25 DIAGNOSIS — E7800 Pure hypercholesterolemia, unspecified: Secondary | ICD-10-CM | POA: Diagnosis present

## 2017-03-25 DIAGNOSIS — Z515 Encounter for palliative care: Secondary | ICD-10-CM | POA: Diagnosis not present

## 2017-03-25 DIAGNOSIS — C8330 Diffuse large B-cell lymphoma, unspecified site: Secondary | ICD-10-CM

## 2017-03-25 DIAGNOSIS — M461 Sacroiliitis, not elsewhere classified: Secondary | ICD-10-CM | POA: Diagnosis present

## 2017-03-25 DIAGNOSIS — I248 Other forms of acute ischemic heart disease: Secondary | ICD-10-CM | POA: Diagnosis present

## 2017-03-25 DIAGNOSIS — Z96642 Presence of left artificial hip joint: Secondary | ICD-10-CM | POA: Diagnosis present

## 2017-03-25 DIAGNOSIS — Z90710 Acquired absence of both cervix and uterus: Secondary | ICD-10-CM

## 2017-03-25 DIAGNOSIS — C8510 Unspecified B-cell lymphoma, unspecified site: Secondary | ICD-10-CM | POA: Diagnosis present

## 2017-03-25 DIAGNOSIS — E872 Acidosis: Secondary | ICD-10-CM | POA: Diagnosis present

## 2017-03-25 DIAGNOSIS — T402X5A Adverse effect of other opioids, initial encounter: Secondary | ICD-10-CM | POA: Diagnosis present

## 2017-03-25 DIAGNOSIS — J189 Pneumonia, unspecified organism: Secondary | ICD-10-CM

## 2017-03-25 DIAGNOSIS — Z923 Personal history of irradiation: Secondary | ICD-10-CM

## 2017-03-25 DIAGNOSIS — Z808 Family history of malignant neoplasm of other organs or systems: Secondary | ICD-10-CM

## 2017-03-25 DIAGNOSIS — I341 Nonrheumatic mitral (valve) prolapse: Secondary | ICD-10-CM | POA: Diagnosis present

## 2017-03-25 DIAGNOSIS — D6481 Anemia due to antineoplastic chemotherapy: Secondary | ICD-10-CM | POA: Diagnosis present

## 2017-03-25 DIAGNOSIS — R531 Weakness: Secondary | ICD-10-CM

## 2017-03-25 DIAGNOSIS — D6959 Other secondary thrombocytopenia: Secondary | ICD-10-CM | POA: Diagnosis present

## 2017-03-25 DIAGNOSIS — I340 Nonrheumatic mitral (valve) insufficiency: Secondary | ICD-10-CM | POA: Diagnosis present

## 2017-03-25 DIAGNOSIS — I472 Ventricular tachycardia: Secondary | ICD-10-CM | POA: Diagnosis not present

## 2017-03-25 DIAGNOSIS — E871 Hypo-osmolality and hyponatremia: Secondary | ICD-10-CM | POA: Diagnosis present

## 2017-03-25 DIAGNOSIS — R0682 Tachypnea, not elsewhere classified: Secondary | ICD-10-CM | POA: Diagnosis present

## 2017-03-25 DIAGNOSIS — Z7952 Long term (current) use of systemic steroids: Secondary | ICD-10-CM

## 2017-03-25 DIAGNOSIS — C8338 Diffuse large B-cell lymphoma, lymph nodes of multiple sites: Secondary | ICD-10-CM | POA: Diagnosis not present

## 2017-03-25 DIAGNOSIS — M1611 Unilateral primary osteoarthritis, right hip: Secondary | ICD-10-CM | POA: Diagnosis present

## 2017-03-25 DIAGNOSIS — R1310 Dysphagia, unspecified: Secondary | ICD-10-CM | POA: Diagnosis present

## 2017-03-25 DIAGNOSIS — R079 Chest pain, unspecified: Secondary | ICD-10-CM | POA: Diagnosis present

## 2017-03-25 DIAGNOSIS — R180 Malignant ascites: Secondary | ICD-10-CM | POA: Diagnosis present

## 2017-03-25 DIAGNOSIS — A419 Sepsis, unspecified organism: Secondary | ICD-10-CM | POA: Diagnosis not present

## 2017-03-25 DIAGNOSIS — D630 Anemia in neoplastic disease: Secondary | ICD-10-CM | POA: Diagnosis present

## 2017-03-25 DIAGNOSIS — I471 Supraventricular tachycardia: Secondary | ICD-10-CM | POA: Diagnosis not present

## 2017-03-25 DIAGNOSIS — J181 Lobar pneumonia, unspecified organism: Secondary | ICD-10-CM | POA: Diagnosis present

## 2017-03-25 DIAGNOSIS — R6520 Severe sepsis without septic shock: Secondary | ICD-10-CM | POA: Diagnosis present

## 2017-03-25 DIAGNOSIS — R627 Adult failure to thrive: Secondary | ICD-10-CM

## 2017-03-25 DIAGNOSIS — I119 Hypertensive heart disease without heart failure: Secondary | ICD-10-CM | POA: Diagnosis present

## 2017-03-25 DIAGNOSIS — N1330 Unspecified hydronephrosis: Secondary | ICD-10-CM | POA: Diagnosis present

## 2017-03-25 DIAGNOSIS — R330 Drug induced retention of urine: Secondary | ICD-10-CM | POA: Diagnosis present

## 2017-03-25 DIAGNOSIS — G9341 Metabolic encephalopathy: Secondary | ICD-10-CM | POA: Diagnosis not present

## 2017-03-25 DIAGNOSIS — Z66 Do not resuscitate: Secondary | ICD-10-CM | POA: Diagnosis not present

## 2017-03-25 LAB — CBC WITH DIFFERENTIAL/PLATELET
BAND NEUTROPHILS: 0 %
Basophils Absolute: 0 10*3/uL (ref 0.0–0.1)
Basophils Relative: 0 %
Blasts: 0 %
EOS ABS: 0 10*3/uL (ref 0.0–0.7)
Eosinophils Relative: 0 %
HEMATOCRIT: 24.9 % — AB (ref 36.0–46.0)
Hemoglobin: 8 g/dL — ABNORMAL LOW (ref 12.0–15.0)
LYMPHS ABS: 2.4 10*3/uL (ref 0.7–4.0)
Lymphocytes Relative: 22 %
MCH: 26.8 pg (ref 26.0–34.0)
MCHC: 32.1 g/dL (ref 30.0–36.0)
MCV: 83.3 fL (ref 78.0–100.0)
METAMYELOCYTES PCT: 0 %
MONO ABS: 0.5 10*3/uL (ref 0.1–1.0)
MONOS PCT: 5 %
Myelocytes: 0 %
NEUTROS ABS: 8 10*3/uL — AB (ref 1.7–7.7)
Neutrophils Relative %: 73 %
Other: 0 %
PLATELETS: 79 10*3/uL — AB (ref 150–400)
Promyelocytes Absolute: 0 %
RBC: 2.99 MIL/uL — ABNORMAL LOW (ref 3.87–5.11)
RDW: 22.3 % — AB (ref 11.5–15.5)
WBC: 10.9 10*3/uL — ABNORMAL HIGH (ref 4.0–10.5)
nRBC: 0 /100 WBC

## 2017-03-25 LAB — COMPREHENSIVE METABOLIC PANEL
ALBUMIN: 2.3 g/dL — AB (ref 3.5–5.0)
ALK PHOS: 120 U/L (ref 38–126)
ALT: 30 U/L (ref 14–54)
AST: 77 U/L — AB (ref 15–41)
Anion gap: 13 (ref 5–15)
BILIRUBIN TOTAL: 0.8 mg/dL (ref 0.3–1.2)
BUN: 21 mg/dL — AB (ref 6–20)
CO2: 25 mmol/L (ref 22–32)
Calcium: 9.7 mg/dL (ref 8.9–10.3)
Chloride: 96 mmol/L — ABNORMAL LOW (ref 101–111)
Creatinine, Ser: 0.57 mg/dL (ref 0.44–1.00)
GFR calc Af Amer: >60 mL/min (ref >60)
GFR calc non Af Amer: >60 mL/min (ref >60)
GLUCOSE: 89 mg/dL (ref 65–99)
POTASSIUM: 4 mmol/L (ref 3.5–5.1)
Sodium: 134 mmol/L — ABNORMAL LOW (ref 135–145)
TOTAL PROTEIN: 4.5 g/dL — AB (ref 6.5–8.1)

## 2017-03-25 LAB — TROPONIN I
TROPONIN I: 0.05 ng/mL — AB (ref <0.03)
TROPONIN I: 0.06 ng/mL — AB (ref <0.03)

## 2017-03-25 LAB — BRAIN NATRIURETIC PEPTIDE: B NATRIURETIC PEPTIDE 5: 45.8 pg/mL (ref 0.0–100.0)

## 2017-03-25 MED ORDER — ENOXAPARIN SODIUM 30 MG/0.3ML ~~LOC~~ SOLN
30.0000 mg | SUBCUTANEOUS | Status: DC
  Administered 2017-03-26 – 2017-03-28 (×3): 30 mg via SUBCUTANEOUS
  Filled 2017-03-25 (×3): qty 0.3

## 2017-03-25 MED ORDER — HYDROMORPHONE HCL 1 MG/ML IJ SOLN
1.0000 mg | INTRAMUSCULAR | Status: DC | PRN
  Administered 2017-03-25 – 2017-03-26 (×3): 1 mg via INTRAVENOUS
  Filled 2017-03-25 (×2): qty 1

## 2017-03-25 MED ORDER — LOPERAMIDE HCL 2 MG PO CAPS: 2.0000 mg | ORAL_CAPSULE | ORAL | Status: DC | PRN

## 2017-03-25 MED ORDER — RADIAPLEXRX EX GEL
1.0000 "application " | Freq: Two times a day (BID) | CUTANEOUS | Status: DC
  Administered 2017-03-26 – 2017-03-31 (×3): 1 via TOPICAL
  Filled 2017-03-25 (×2): qty 170

## 2017-03-25 MED ORDER — SODIUM CHLORIDE 0.9 % IV BOLUS (SEPSIS)
1000.0000 mL | Freq: Once | INTRAVENOUS | Status: AC
  Administered 2017-03-25: 1000 mL via INTRAVENOUS

## 2017-03-25 MED ORDER — HYDROMORPHONE HCL 1 MG/ML IJ SOLN
INTRAMUSCULAR | Status: AC
  Filled 2017-03-25: qty 1

## 2017-03-25 MED ORDER — DOCUSATE SODIUM 100 MG PO CAPS
100.0000 mg | ORAL_CAPSULE | Freq: Two times a day (BID) | ORAL | Status: DC
  Administered 2017-03-26 – 2017-03-28 (×5): 100 mg via ORAL
  Filled 2017-03-25 (×4): qty 1

## 2017-03-25 MED ORDER — ACETAMINOPHEN 325 MG PO TABS
650.0000 mg | ORAL_TABLET | Freq: Four times a day (QID) | ORAL | Status: DC | PRN
  Administered 2017-03-26: 650 mg via ORAL
  Filled 2017-03-25: qty 2

## 2017-03-25 MED ORDER — MAGNESIUM OXIDE 400 (241.3 MG) MG PO TABS
400.0000 mg | ORAL_TABLET | Freq: Two times a day (BID) | ORAL | Status: DC
  Administered 2017-03-26 – 2017-03-28 (×5): 400 mg via ORAL
  Filled 2017-03-25 (×5): qty 1

## 2017-03-25 MED ORDER — ACETAMINOPHEN 650 MG RE SUPP
650.0000 mg | Freq: Four times a day (QID) | RECTAL | Status: DC | PRN
  Administered 2017-03-29: 650 mg via RECTAL
  Filled 2017-03-25: qty 1

## 2017-03-25 MED ORDER — KCL IN DEXTROSE-NACL 40-5-0.9 MEQ/L-%-% IV SOLN
INTRAVENOUS | Status: AC
  Administered 2017-03-25 – 2017-03-26 (×2): via INTRAVENOUS
  Filled 2017-03-25 (×2): qty 1000

## 2017-03-25 MED ORDER — ONDANSETRON HCL 4 MG/2ML IJ SOLN: 4.0000 mg | Freq: Four times a day (QID) | INTRAMUSCULAR | Status: DC | PRN

## 2017-03-25 MED ORDER — OXYCODONE HCL 5 MG PO TABS: 10.0000 mg | ORAL_TABLET | ORAL | Status: DC | PRN

## 2017-03-25 MED ORDER — DEXAMETHASONE 2 MG PO TABS
2.0000 mg | ORAL_TABLET | Freq: Every day | ORAL | Status: DC
  Administered 2017-03-26: 2 mg via ORAL
  Filled 2017-03-25 (×3): qty 1

## 2017-03-25 MED ORDER — ONDANSETRON HCL 4 MG PO TABS: 4.0000 mg | ORAL_TABLET | Freq: Four times a day (QID) | ORAL | Status: DC | PRN

## 2017-03-25 MED ORDER — ADULT MULTIVITAMIN W/MINERALS CH
1.0000 | ORAL_TABLET | Freq: Every day | ORAL | Status: DC
  Administered 2017-03-26 – 2017-03-27 (×2): 1 via ORAL
  Filled 2017-03-25 (×3): qty 1

## 2017-03-25 MED ORDER — FENTANYL CITRATE (PF) 100 MCG/2ML IJ SOLN
12.5000 ug | Freq: Once | INTRAMUSCULAR | Status: AC
  Administered 2017-03-25: 12.5 ug via INTRAVENOUS
  Filled 2017-03-25: qty 2

## 2017-03-25 MED ORDER — SUCRALFATE 1 G PO TABS
1.0000 g | ORAL_TABLET | Freq: Four times a day (QID) | ORAL | Status: DC
  Administered 2017-03-26 – 2017-03-28 (×8): 1 g via ORAL
  Filled 2017-03-25 (×8): qty 1

## 2017-03-25 MED ORDER — PANTOPRAZOLE SODIUM 20 MG PO TBEC
20.0000 mg | DELAYED_RELEASE_TABLET | Freq: Every day | ORAL | Status: DC
  Administered 2017-03-26 – 2017-03-28 (×3): 20 mg via ORAL
  Filled 2017-03-25 (×3): qty 1

## 2017-03-25 MED ORDER — METHOCARBAMOL 500 MG PO TABS
500.0000 mg | ORAL_TABLET | Freq: Three times a day (TID) | ORAL | Status: DC
  Administered 2017-03-26 – 2017-03-28 (×8): 500 mg via ORAL
  Filled 2017-03-25 (×8): qty 1

## 2017-03-25 NOTE — H&P (Signed)
History and Physical    Christina Peterson:[id number] DOB: [birth year] DOA: 04/05/2017  PCP: Junie Panning, NP  Patient coming from:Home  Chief Complaint: Generalized body pain, generalized weakness.  Worsening for last 3-4 days.  HPI: Christina Peterson is a 76 y.o. 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  Patient was accompanied by her 2 daughters in ER.  Patient lives by herself and she is gradually declining especially her functional status and her appetite. Patient reported that she has generalized body pain and current pain medication is not helping to manage her pain.  She reported a headache, chest pain, abdomen pain, hip pain, leg pain, hand pain.  She is gradually worsened.  She also has physical weakness.  She usually walks with the help of walker but recently too weak to walk.  She lives by herself.  Has nausea but no vomiting.  Denies shortness of breath, cough.  ED Course: In the ER patient was found to have minimal elevation in troponin.  ER wanted to admit patient for chest pain.  However patient reported generalized body pain mostly in the back, hip and legs.  Admitted for further evaluation.  Review of Systems: As per HPI otherwise 10 point review of systems negative.    Past Medical History:  Diagnosis Date  . Arthritis   . Diffuse large B-cell lymphoma of lymph nodes of multiple regions ([place]) 08/22/2016  . Hypercholesteremia   . Hypertension    FOLLOWED BY DR Katherine Roan  . Mitral valve prolapse -  severe MR 09/20/2016   Posterior Leaflet  . Severe mitral regurgitation: Per 2 D echo 08/23/2016 08/23/2016   .posterior leaflet prolapse of mitral valve with anteriorly directed  mitral regurgitation. Severe. Severely dilated left atrium.  . Severe protein-calorie malnutrition ([place])     Past Surgical History:  Procedure Laterality Date  . ABDOMINAL HYSTERECTOMY    . IR FLUORO GUIDE PORT INSERTION RIGHT  08/23/2016  . IR US GUIDE VASC ACCESS RIGHT  08/23/2016  . JOINT REPLACEMENT    . 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 TOTAL HIP REVISION  05/02/2011   Procedure: TOTAL HIP REVISION;  Surgeon: Sharmon Revere, MD;  Location: [facility];  Service: Orthopedics;  Laterality: Left;  left total hip revision with bone grafting  . TRANSTHORACIC ECHOCARDIOGRAM  08/23/2013    EF 60-65%. GR 2 DD.posterior leaflet prolapse of mitral valve with anteriorly directed mitral regurgitation. Severe. Severely dilated left atrium.    Social history: Reports that  has never smoked. she has never used smokeless tobacco. She reports that she does not drink alcohol or use drugs.  No Known Allergies reviewed, no known allergies.  Family History  Problem Relation Age of Onset  . Heart disease Mother   . Hypertension Mother   . Heart attack Mother 43  . Hypertension Father   . Brain cancer Father   . Brain cancer Sister   . Hypertension Sister   . Stroke Sister   . Alcohol abuse Brother   . Depression Daughter   . Hypertension Daughter   . Thyroid disease Daughter   . Migraines Daughter      Prior to Admission medications   Medication Sig Start Date End Date Taking? Authorizing Provider  dexamethasone (DECADRON) 2 MG  tablet Take 1 tablet (2 mg total) daily with breakfast by mouth. 02/25/17  Yes Brunetta Genera, MD  docusate sodium (COLACE) 100 MG capsule Take 1 capsule (100 mg total) by mouth every 12 (twelve) hours. 08/15/16  Yes Barton Dubois, MD  hyaluronate sodium (RADIAPLEXRX) GEL Apply 1 application topically 2 (two) times daily. Apply after rad and bedtime to affected skin area, nothing 4 hours prior rad tx   Yes [provider]  lidocaine-prilocaine (EMLA)  cream Apply to port 1-2 hours before needle access. Cover with plastic wrap. 11/06/16  Yes Brunetta Genera, MD  Loperamide HCl (IMODIUM A-D PO) Take 1-2 tablets by mouth 2 (two) times daily as needed (DIARRHEA).   Yes [provider]  magnesium oxide (MAG-OX) 400 (241.3 Mg) MG tablet Take 1 tablet (400 mg total) by mouth 2 (two) times daily. 02/10/17  Yes Brunetta Genera, MD  non-metallic deodorant Jethro Poling) MISC Apply 1 application topically daily.   Yes [provider]  ondansetron (ZOFRAN ODT) 4 MG disintegrating tablet 4mg  ODT q4 hours prn nausea/vomit 03/08/17  Yes Deno Etienne, DO  ondansetron (ZOFRAN) 8 MG tablet Take 1 tablet (8 mg total) by mouth every 8 (eight) hours as needed for nausea or vomiting. 12/23/16  Yes Brunetta Genera, MD  oxyCODONE (OXY IR/ROXICODONE) 5 MG immediate release tablet Take 1-2 tablets (5-10 mg total) by mouth every 4 (four) hours as needed for severe pain or breakthrough pain. 03/07/17  Yes Perlov, Marinell Blight, MD  pantoprazole (PROTONIX) 20 MG tablet Take 1 tablet (20 mg total) by mouth daily. 07/19/16  Yes Lacretia Leigh, MD  prochlorperazine (COMPAZINE) 10 MG tablet Take 1 tablet (10 mg total) by mouth every 6 (six) hours as needed for nausea or vomiting. 12/23/16  Yes Brunetta Genera, MD  sucralfate (CARAFATE) 1 g tablet Take 1 tablet (1 g total) by mouth 4 (four) times daily. 07/19/16  Yes Lacretia Leigh, MD  dronabinol (MARINOL) 2.5 MG capsule Take 1 capsule (2.5 mg total) by mouth 2 (two) times daily before a meal. Patient not taking: Reported on 03/28/2017 11/11/16   Brunetta Genera, MD  potassium chloride SA (K-DUR,KLOR-CON) 20 MEQ tablet Take 1 tablet (20 mEq total) by mouth 2 (two) times daily. Patient not taking: Reported on 04/07/2017 09/12/16   Debbe Odea, MD  predniSONE (DELTASONE) 20 MG tablet Take 3 tablets (60 mg total) by mouth See admin instructions. 60 mg po daily Day 1 to Day 5 of each chemotherapy cycle Patient  not taking: Reported on 03/24/2017 08/28/16   Brunetta Genera, MD  promethazine (PHENERGAN) 25 MG suppository Place 1 suppository (25 mg total) rectally every 6 (six) hours as needed for nausea or vomiting. Patient not taking: Reported on 03/15/2017 03/08/17   Deno Etienne, DO  senna-docusate (SENNA S) 8.6-50 MG tablet Take 2 tablets by mouth 2 (two) times daily. Reduce to 2tab po HS if diarrhea occurs Patient not taking: Reported on 03/24/2017 09/02/16   Brunetta Genera, MD    Physical Exam: Vitals:   03/28/2017 1436 04/02/2017 1438 03/23/2017 1700  BP:  99/60 104/66  Pulse:  88   Resp:  16 18  Temp:  98.7 F (37.1 C)   TempSrc:  Oral   SpO2:  100%   Weight: 34 kg (75 lb)    Height: 4\' 11"  (1.499 m)        Constitutional: Cachectic ill looking female lying in bed. Vitals:   03/20/2017 1436 03/31/2017 1438 04/03/2017 1700  BP:  99/60 104/66  Pulse:  88   Resp:  16 18  Temp:  98.7 F (37.1 C)   TempSrc:  Oral   SpO2:  100%   Weight: 34 kg (75 lb)    Height: 4\' 11"  (1.499 m)     Eyes: PERRL, lids and conjunctivae normal ENMT: Mucous membranes are dry Neck: normal, supple Respiratory: clear to auscultation bilaterally, no wheezing, no crackles. Normal respiratory effort. No accessory muscle use.  Cardiovascular: Regular rate and rhythm, S1S2 Normal. no murmurs / rubs / gallops.  Bilateral lower extremity pitting edema.   Abdomen: Soft, Non-tender, non-distended. Bowel sounds positive.  Musculoskeletal: no clubbing / cyanosis.  Skin: no rashes, lesions, ulcers. No induration Neurologic: Alert awake and following commands.  No focal neurological deficit. Psychiatric: Normal judgment and insight. Alert and oriented x 3. Normal mood.    Labs on Admission: I have personally reviewed following labs and imaging studies  CBC: Recent Labs  Lab 04/10/2017 1513  WBC 10.9*  NEUTROABS PENDING  HGB 8.0*  HCT 24.9*  MCV 83.3  PLT 79*   Basic Metabolic Panel: Recent Labs  Lab  04/12/2017 1513  NA 134*  K 4.0  CL 96*  CO2 25  GLUCOSE 89  BUN 21*  CREATININE 0.57  CALCIUM 9.7   GFR: Estimated Creatinine Clearance: 31.6 mL/min (by C-G formula based on SCr of 0.57 mg/dL). Liver Function Tests: Recent Labs  Lab 04/06/2017 1513  AST 77*  ALT 30  ALKPHOS 120  BILITOT 0.8  PROT 4.5*  ALBUMIN 2.3*   No results for input(s): LIPASE, AMYLASE in the last 168 hours. No results for input(s): AMMONIA in the last 168 hours. Coagulation Profile: No results for input(s): INR, PROTIME in the last 168 hours. Cardiac Enzymes: Recent Labs  Lab 04/04/2017 1513  TROPONINI 0.05*   BNP (last 3 results) No results for input(s): PROBNP in the last 8760 hours. HbA1C: No results for input(s): HGBA1C in the last 72 hours. CBG: No results for input(s): GLUCAP in the last 168 hours. Lipid Profile: No results for input(s): CHOL, HDL, LDLCALC, TRIG, CHOLHDL, LDLDIRECT in the last 72 hours. Thyroid Function Tests: No results for input(s): TSH, T4TOTAL, FREET4, T3FREE, THYROIDAB in the last 72 hours. Anemia Panel: No results for input(s): VITAMINB12, FOLATE, FERRITIN, TIBC, IRON, RETICCTPCT in the last 72 hours. Urine analysis:    Component Value Date/Time   COLORURINE YELLOW 08/21/2016 1700   APPEARANCEUR CLEAR 08/21/2016 1700   LABSPEC 1.012 08/21/2016 1700   PHURINE 6.0 08/21/2016 1700   GLUCOSEU NEGATIVE 08/21/2016 1700   HGBUR MODERATE (A) 08/21/2016 1700   BILIRUBINUR NEGATIVE 08/21/2016 1700   KETONESUR 5 (A) 08/21/2016 1700   PROTEINUR NEGATIVE 08/21/2016 1700   UROBILINOGEN 0.2 04/30/2011 1145   NITRITE NEGATIVE 08/21/2016 1700   LEUKOCYTESUR NEGATIVE 08/21/2016 1700   Sepsis Labs: !!!!!!!!!!!!!!!!!!!!!!!!!!!!!!!!!!!!!!!!!!!! @LABRCNTIP (procalcitonin:4,lacticidven:4) )No results found for this or any previous visit (from the past 240 hour(s)).   Radiological Exams on Admission: Dg Chest 2 View  Result Date: 03/19/2017 CLINICAL DATA:  Weakness for 3  days.  Bilateral hip pain. EXAM: CHEST  2 VIEW COMPARISON:  PA and lateral chest 12/19/2017. FINDINGS: Port-A-Cath remains in place. There is cardiomegaly without edema. No consolidative process, pneumothorax or effusion. No acute bony abnormality. IMPRESSION: Cardiomegaly without acute disease. Electronically Signed   By: Inge Rise M.D.   On: 04/06/2017 17:56   Dg Hips Bilat W Or Wo Pelvis 2 Views  Result Date: 03/24/2017 CLINICAL DATA:  Weakness bilateral hip pain for 3 days. No known injury. EXAM: DG HIP (WITH OR WITHOUT PELVIS) 2V BILAT COMPARISON:  None. FINDINGS: There is no acute bony or joint abnormality. 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 Moderate to moderately severe degenerative disease about the right hip is seen. The patient has bilateral sacroiliitis which appears remote and worse on the left. Soft tissues are unremarkable. IMPRESSION: No acute abnormality. 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 Electronically Signed   By: Inge Rise M.D.   On: 04/05/2017 17:57    EKG: Independently reviewed.  Poor baseline, sinus rhythm. Assessment/Plan Active Problems:   Severe protein-calorie malnutrition (HCC)   B-cell lymphoma (HCC)   Cancer associated pain   Chest pain  #Cancer associated generalized body pain associated with physical deconditioning, generalized weakness and severe protein calorie malnutrition: -Patient reported poor oral intake associated with worsening her symptoms.  Her pain is not currently controlled with oral oxycodone.  She lives by herself.  She has difficulty walking with a walker recently because of generalized weakness.  ER admitted patient for chest pain.  Patient however reported that her pain is mostly in her back, hip and lower extremities. -Continue oxycodone, and IV Dilaudid as needed for breakthrough pain -Add Robaxin -Stool softener -Chest x-ray with cardiomegaly with no acute  finding.  Hip x-ray consistent with moderate to severe right hip osteoarthritis.  Patient also has chronic sacroiliitis. -PT, OT and case manager evaluation -Dietary consult.  Regular diet as tolerated. -Continue supportive care.  #Mild elevation in troponin likely demand ischemia: Patient was tachycardia, looks dehydrated and cachectic.  Echo done in October 2018 consistent with EF of 55-60%.  She has severe MR which I recommend to follow-up with cardiologist outpatient. -Trend troponins serially, repeat EKG -Do not think patient has PE.  D-dimer will be not helpful given malignancy.  #Diffuse large B-cell lymphoma: Received chemotherapy and radiation therapy.  Consult oncology Dr. Irene Limbo in the morning.  #Goals of care discussion: I had a long discussion about CODE STATUS and goals of care with the patient and her daughters at bedside.  Patient like to be full code.  Continue supportive care. -PT, OT, case manager and dietary consult.   Mild hyponatremia, mild transaminase, chronic normocytic anemia likely due to malignancy, chemotherapy, mild leukocytosis noted in the lab.  Continue to monitor.  Patient does not have any urinary symptoms.  Overall patient looks cachectic severely malnourished and likely gradually declining in clinical and physical condition due to her malignancy chemotherapy.  Continue current medication and supportive care.  Have discussed and relayed this information to the patient's daughter at bedside.  Patient may benefit from palliative care consult after oncology evaluation for her malignancy. DVT prophylaxis: Lovenox subcutaneous Code Status: Full code Family Communication: Discussed with 2 daughters at bedside Disposition Plan: Telemetry Consults called: None Admission status: Observation   Peggye Poon Tanna Furry MD [facility] Pager [phone]  If 7PM-7AM, please contact night-coverage [email or web address] Password [email or web address]  03/20/2017, 6:02 PM

## 2017-03-25 NOTE — ED Provider Notes (Signed)
[facility] DEPT Provider Note   CSN: [id number] Arrival date & time: 04/07/2017  1423     History   Chief Complaint Chief Complaint  Patient presents with  . Weakness    HPI Christina Peterson is a 76 y.o. female.  HPI  Patient presents from home with concern of weakness, abdominal discomfort. Patient has multiple medical issues including B-cell lymphoma. She is currently receiving radiation therapy. She notes that she has recently become progressively more weak, without a clear precipitant. During this illness, diminished oral intake, increasing nausea, no vomiting. No fever. There is associated chest pain, dyspnea, generalized discomfort.   Past Medical History:  Diagnosis Date  . Arthritis   . Diffuse large B-cell lymphoma of lymph nodes of multiple regions ([place]) 08/22/2016  . Hypercholesteremia   . Hypertension    FOLLOWED BY DR Katherine Roan  . Mitral valve prolapse -  severe MR 09/20/2016   Posterior Leaflet  . Severe mitral regurgitation: Per 2 D echo 08/23/2016 08/23/2016   .posterior leaflet prolapse of mitral valve with anteriorly directed mitral regurgitation. Severe. Severely dilated left atrium.  . Severe protein-calorie malnutrition [facility])     Patient Active Problem List   Diagnosis Date Noted  . Mitral valve prolapse -  severe MR 09/20/2016  . Aspiration pneumonia due to inhalation of vomitus ([place]) 09/12/2016  . Hypokalemia 09/12/2016  . Counseling regarding advanced care planning and goals of care   . Sepsis ([place]) 09/07/2016  . Nausea and vomiting 09/07/2016  . Ileus ([place]) 09/07/2016  . Severe mitral regurgitation: Per 2 D echo 08/23/2016 08/23/2016  . Diffuse large B-cell lymphoma of lymph nodes of multiple regions ([place]) 08/22/2016  . B-cell lymphoma ([place])   . 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 History of peristent atrial fibrillation 08/20/2016  . Abdominal aortic atherosclerosis ([place]) - noted on CT scan 08/20/2016  .  Severe protein-calorie malnutrition ([place])   . Hyponatremia 08/11/2016  . Normocytic normochromic anemia 08/11/2016  . Essential hypertension 08/11/2016  . Constipation 08/11/2016  . Hyperlipidemia 08/11/2016  . Abdominal pain 08/11/2016  . Renal mass, right 08/11/2016  . Adenopathy 08/11/2016  . Other intra-abdominal and pelvic swelling, mass and lump 08/11/2016  . Pain in left hip 06/05/2016    Past Surgical History:  Procedure Laterality Date  . ABDOMINAL HYSTERECTOMY    . IR FLUORO GUIDE PORT INSERTION RIGHT  08/23/2016  . IR US GUIDE VASC ACCESS RIGHT  08/23/2016  . JOINT REPLACEMENT    . 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 TOTAL HIP REVISION  05/02/2011   Procedure: TOTAL HIP REVISION;  Surgeon: Sharmon Revere, MD;  Location: [facility];  Service: Orthopedics;  Laterality: Left;  left total hip revision with bone grafting  . TRANSTHORACIC ECHOCARDIOGRAM  08/23/2013    EF 60-65%. GR 2 DD.posterior leaflet prolapse of mitral valve with anteriorly directed mitral regurgitation. Severe. Severely dilated left atrium.    OB History    No data available       Home Medications    Prior to Admission medications   Medication Sig Start Date End Date Taking? Authorizing Provider  dexamethasone (DECADRON) 2 MG tablet Take 1 tablet (2 mg total) daily with breakfast by mouth. 02/25/17   Brunetta Genera, MD  docusate sodium (COLACE) 100 MG capsule Take 1 capsule (100 mg total) by mouth every 12 (twelve) hours. 08/15/16   Barton Dubois, MD  dronabinol (MARINOL) 2.5 MG capsule Take 1 capsule (  2.5 mg total) by mouth 2 (two) times daily before a meal. 11/11/16   Brunetta Genera, MD  hyaluronate sodium (RADIAPLEXRX) GEL Apply 1 application topically 2 (two) times daily. Apply after rad and bedtime to affected skin area, nothing 4 hours prior rad tx    [provider]  hydrOXYzine (ATARAX/VISTARIL) 25 MG tablet Take 25 mg by mouth 3 (three) times daily as needed.    Bartholome Bill, MD  lidocaine-prilocaine (EMLA) cream Apply to port 1-2 hours before needle access. Cover with plastic wrap. 11/06/16   Brunetta Genera, MD  magnesium oxide (MAG-OX) 400 (241.3 Mg) MG tablet Take 1 tablet (400 mg total) by mouth 2 (two) times daily. 02/10/17   Brunetta Genera, MD  non-metallic deodorant Jethro Poling) MISC Apply 1 application topically daily.    [provider]  ondansetron (ZOFRAN ODT) 4 MG disintegrating tablet 4mg  ODT q4 hours prn nausea/vomit 03/08/17   Deno Etienne, DO  ondansetron (ZOFRAN) 8 MG tablet Take 1 tablet (8 mg total) by mouth every 8 (eight) hours as needed for nausea or vomiting. 12/23/16   Brunetta Genera, MD  oxyCODONE (OXY IR/ROXICODONE) 5 MG immediate release tablet Take 1-2 tablets (5-10 mg total) by mouth every 4 (four) hours as needed for severe pain or breakthrough pain. 03/07/17   Ardath Sax, MD  pantoprazole (PROTONIX) 20 MG tablet Take 1 tablet (20 mg total) by mouth daily. 07/19/16   Lacretia Leigh, MD  potassium chloride SA (K-DUR,KLOR-CON) 20 MEQ tablet Take 1 tablet (20 mEq total) by mouth 2 (two) times daily. 09/12/16   Debbe Odea, MD  predniSONE (DELTASONE) 20 MG tablet Take 3 tablets (60 mg total) by mouth See admin instructions. 60 mg po daily Day 1 to Day 5 of each chemotherapy cycle 08/28/16   Brunetta Genera, MD  prochlorperazine (COMPAZINE) 10 MG tablet Take 1 tablet (10 mg total) by mouth every 6 (six) hours as needed for nausea or vomiting. 12/23/16   Brunetta Genera, MD  promethazine (PHENERGAN) 25 MG suppository Place 1 suppository (25 mg total) rectally every 6 (six) hours as needed for nausea or vomiting. 03/08/17   Deno Etienne, DO  senna-docusate (SENNA S) 8.6-50 MG tablet Take 2 tablets by mouth 2 (two) times daily. Reduce to 2tab po HS if diarrhea occurs 09/02/16   Brunetta Genera, MD  sucralfate (CARAFATE) 1 g tablet Take 1 tablet (1 g total) by mouth 4 (four) times daily. 07/19/16   Lacretia Leigh,  MD    Family History Family History  Problem Relation Age of Onset  . Heart disease Mother   . Hypertension Mother   . Heart attack Mother 2  . Hypertension Father   . Brain cancer Father   . Brain cancer Sister   . Hypertension Sister   . Stroke Sister   . Alcohol abuse Brother   . Depression Daughter   . Hypertension Daughter   . Thyroid disease Daughter   . Migraines Daughter     Social History Social History   Tobacco Use  . Smoking status: Never Smoker  . Smokeless tobacco: Never Used  Substance Use Topics  . Alcohol use: No  . Drug use: No     Allergies   Patient has no known allergies.   Review of Systems Review of Systems  Constitutional:       Per HPI, otherwise negative  HENT:       Per HPI, otherwise negative  Respiratory:  Per HPI, otherwise negative  Cardiovascular:       Per HPI, otherwise negative  Gastrointestinal: Positive for nausea. Negative for vomiting.  Endocrine:       Negative aside from HPI  Genitourinary:       Neg aside from HPI   Musculoskeletal:       Per HPI, otherwise negative  Skin: Negative.   Allergic/Immunologic: Positive for immunocompromised state.  Neurological: Positive for weakness. Negative for syncope.     Physical Exam Updated Vital Signs BP 99/60 (BP Location: Left Arm)   Pulse 88   Temp 98.7 F (37.1 C) (Oral)   Resp 16   Ht 4\' 11"  (1.499 m)   Wt 34 kg (75 lb)   SpO2 100%   BMI 15.15 kg/m   Physical Exam  Constitutional: She is oriented to person, place, and time. She has a sickly appearance. No distress.  HENT:  Head: Normocephalic and atraumatic.  Eyes: Conjunctivae and EOM are normal.  Cardiovascular: Normal rate and regular rhythm.  Pulmonary/Chest: Effort normal and breath sounds normal. No stridor. No respiratory distress.    Abdominal: She exhibits no distension.    Musculoskeletal: She exhibits no edema.  Neurological: She is alert and oriented to person, place, and time.  She displays atrophy. No cranial nerve deficit.  Skin: Skin is warm and dry.  Psychiatric: She is slowed and withdrawn.  Nursing note and vitals reviewed.    ED Treatments / Results  Labs (all labs ordered are listed, but only abnormal results are displayed) Labs Reviewed  COMPREHENSIVE METABOLIC PANEL - Abnormal; Notable for the following components:      Result Value   Sodium 134 (*)    Chloride 96 (*)    BUN 21 (*)    Total Protein 4.5 (*)    Albumin 2.3 (*)    AST 77 (*)    All other components within normal limits  CBC WITH DIFFERENTIAL/PLATELET - Abnormal; Notable for the following components:   WBC 10.9 (*)    RBC 2.99 (*)    Hemoglobin 8.0 (*)    HCT 24.9 (*)    RDW 22.3 (*)    All other components within normal limits  TROPONIN I - Abnormal; Notable for the following components:   Troponin I 0.05 (*)    All other components within normal limits  BRAIN NATRIURETIC PEPTIDE    EKG  EKG Interpretation  Date/Time:  Tuesday March 25 2017 14:43:33 EST Ventricular Rate:  90 PR Interval:    QRS Duration: 80 QT Interval:  403 QTC Calculation: 494 R Axis:   84 Text Interpretation:  Sinus rhythm Atrial premature complex T wave abnormality Baseline wander Abnormal ekg Confirmed by Carmin Muskrat [phone]) on 04/05/2017 3:02:44 PM       Radiology No results found.  Procedures Procedures (including critical care time)  Medications Ordered in ED Medications  sodium chloride 0.9 % bolus 1,000 mL (0 mLs Intravenous Stopped 03/17/2017 1701)  fentaNYL (SUBLIMAZE) injection 12.5 mcg (12.5 mcg Intravenous Given 04/12/2017 1657)     Initial Impression / Assessment and Plan / ED Course  I have reviewed the triage vital signs and the nursing notes.  Pertinent labs & imaging results that were available during my care of the patient were reviewed by me and considered in my medical decision making (see chart for details).  Chart review notable for evaluation within the  past 2 weeks for similar complaints.  She also has prior studies including PET scan demonstrating  adenopathy throughout the peritoneal cavity.    Repeat exam the patient is in no distress, continues to appear generally weak, but is awake and alert, speaking. Initial labs generally reassuring, though there is a trivial elevation of the troponin. Patient has no EKG changes, and given her active malignancy suspicion for heightened metabolic demand, absence of chest pain, there is low suspicion for atypical ACS.  Patient presents with chest pain. Patient has multiple medical issues including active therapy for lymphoma. Patient is not hypoxic, has a nonischemic EKG, but given elevated troponin, ongoing chest pain, though there is suspicion for demand ischemia, the patient was admitted for further evaluation, management and fluid resuscitation.   Final Clinical Impressions(s) / ED Diagnoses  Atypical chest pain Weakness   Carmin Muskrat, MD 04/09/2017 [phone]

## 2017-03-25 NOTE — ED Notes (Signed)
Bed: WA04 Expected date:  Expected time:  Means of arrival:  Comments: EMS-weakness-cancer

## 2017-03-25 NOTE — ED Triage Notes (Signed)
Pt BIB home from EMS for increased weakness x3 days. Pt's family member states pt has needed help to walk, EMS states pt was ambulatory. C/o lower back and abdominal pain. A/Ox4.

## 2017-03-26 DIAGNOSIS — D696 Thrombocytopenia, unspecified: Secondary | ICD-10-CM | POA: Diagnosis not present

## 2017-03-26 DIAGNOSIS — C8330 Diffuse large B-cell lymphoma, unspecified site: Secondary | ICD-10-CM | POA: Diagnosis not present

## 2017-03-26 DIAGNOSIS — Z66 Do not resuscitate: Secondary | ICD-10-CM | POA: Diagnosis not present

## 2017-03-26 DIAGNOSIS — D630 Anemia in neoplastic disease: Secondary | ICD-10-CM | POA: Diagnosis present

## 2017-03-26 DIAGNOSIS — R627 Adult failure to thrive: Secondary | ICD-10-CM

## 2017-03-26 DIAGNOSIS — D6959 Other secondary thrombocytopenia: Secondary | ICD-10-CM | POA: Diagnosis present

## 2017-03-26 DIAGNOSIS — D649 Anemia, unspecified: Secondary | ICD-10-CM | POA: Diagnosis not present

## 2017-03-26 DIAGNOSIS — J181 Lobar pneumonia, unspecified organism: Secondary | ICD-10-CM | POA: Diagnosis present

## 2017-03-26 DIAGNOSIS — I959 Hypotension, unspecified: Secondary | ICD-10-CM | POA: Diagnosis not present

## 2017-03-26 DIAGNOSIS — R5383 Other fatigue: Secondary | ICD-10-CM | POA: Diagnosis not present

## 2017-03-26 DIAGNOSIS — E871 Hypo-osmolality and hyponatremia: Secondary | ICD-10-CM | POA: Diagnosis present

## 2017-03-26 DIAGNOSIS — N289 Disorder of kidney and ureter, unspecified: Secondary | ICD-10-CM | POA: Diagnosis not present

## 2017-03-26 DIAGNOSIS — Z515 Encounter for palliative care: Secondary | ICD-10-CM | POA: Diagnosis not present

## 2017-03-26 DIAGNOSIS — D6481 Anemia due to antineoplastic chemotherapy: Secondary | ICD-10-CM | POA: Diagnosis present

## 2017-03-26 DIAGNOSIS — R180 Malignant ascites: Secondary | ICD-10-CM | POA: Diagnosis present

## 2017-03-26 DIAGNOSIS — K651 Peritoneal abscess: Secondary | ICD-10-CM | POA: Diagnosis present

## 2017-03-26 DIAGNOSIS — R0682 Tachypnea, not elsewhere classified: Secondary | ICD-10-CM | POA: Diagnosis present

## 2017-03-26 DIAGNOSIS — G893 Neoplasm related pain (acute) (chronic): Secondary | ICD-10-CM | POA: Diagnosis not present

## 2017-03-26 DIAGNOSIS — R1310 Dysphagia, unspecified: Secondary | ICD-10-CM | POA: Diagnosis present

## 2017-03-26 DIAGNOSIS — I119 Hypertensive heart disease without heart failure: Secondary | ICD-10-CM | POA: Diagnosis present

## 2017-03-26 DIAGNOSIS — T451X5A Adverse effect of antineoplastic and immunosuppressive drugs, initial encounter: Secondary | ICD-10-CM | POA: Diagnosis present

## 2017-03-26 DIAGNOSIS — G9341 Metabolic encephalopathy: Secondary | ICD-10-CM | POA: Diagnosis not present

## 2017-03-26 DIAGNOSIS — I471 Supraventricular tachycardia: Secondary | ICD-10-CM | POA: Diagnosis not present

## 2017-03-26 DIAGNOSIS — I248 Other forms of acute ischemic heart disease: Secondary | ICD-10-CM | POA: Diagnosis present

## 2017-03-26 DIAGNOSIS — C8302 Small cell B-cell lymphoma, intrathoracic lymph nodes: Secondary | ICD-10-CM | POA: Diagnosis not present

## 2017-03-26 DIAGNOSIS — A419 Sepsis, unspecified organism: Secondary | ICD-10-CM | POA: Diagnosis not present

## 2017-03-26 DIAGNOSIS — E872 Acidosis: Secondary | ICD-10-CM | POA: Diagnosis present

## 2017-03-26 DIAGNOSIS — R630 Anorexia: Secondary | ICD-10-CM

## 2017-03-26 DIAGNOSIS — N1330 Unspecified hydronephrosis: Secondary | ICD-10-CM | POA: Diagnosis present

## 2017-03-26 DIAGNOSIS — R6520 Severe sepsis without septic shock: Secondary | ICD-10-CM | POA: Diagnosis not present

## 2017-03-26 DIAGNOSIS — R531 Weakness: Secondary | ICD-10-CM

## 2017-03-26 DIAGNOSIS — C8339 Diffuse large B-cell lymphoma, extranodal and solid organ sites: Secondary | ICD-10-CM | POA: Diagnosis present

## 2017-03-26 DIAGNOSIS — C641 Malignant neoplasm of right kidney, except renal pelvis: Secondary | ICD-10-CM | POA: Diagnosis not present

## 2017-03-26 DIAGNOSIS — C8338 Diffuse large B-cell lymphoma, lymph nodes of multiple sites: Secondary | ICD-10-CM | POA: Diagnosis not present

## 2017-03-26 DIAGNOSIS — I472 Ventricular tachycardia: Secondary | ICD-10-CM | POA: Diagnosis not present

## 2017-03-26 DIAGNOSIS — E43 Unspecified severe protein-calorie malnutrition: Secondary | ICD-10-CM | POA: Diagnosis present

## 2017-03-26 DIAGNOSIS — R64 Cachexia: Secondary | ICD-10-CM | POA: Diagnosis present

## 2017-03-26 LAB — CBC
HEMATOCRIT: 25 % — AB (ref 36.0–46.0)
HEMOGLOBIN: 8.1 g/dL — AB (ref 12.0–15.0)
MCH: 27.1 pg (ref 26.0–34.0)
MCHC: 32.4 g/dL (ref 30.0–36.0)
MCV: 83.6 fL (ref 78.0–100.0)
Platelets: 77 10*3/uL — ABNORMAL LOW (ref 150–400)
RBC: 2.99 MIL/uL — AB (ref 3.87–5.11)
RDW: 22.2 % — ABNORMAL HIGH (ref 11.5–15.5)
WBC: 10.3 10*3/uL (ref 4.0–10.5)

## 2017-03-26 LAB — COMPREHENSIVE METABOLIC PANEL
ALK PHOS: 124 U/L (ref 38–126)
ALT: 30 U/L (ref 14–54)
AST: 73 U/L — AB (ref 15–41)
Albumin: 2.1 g/dL — ABNORMAL LOW (ref 3.5–5.0)
Anion gap: 11 (ref 5–15)
BUN: 17 mg/dL (ref 6–20)
CALCIUM: 9.4 mg/dL (ref 8.9–10.3)
CHLORIDE: 101 mmol/L (ref 101–111)
CO2: 22 mmol/L (ref 22–32)
CREATININE: 0.58 mg/dL (ref 0.44–1.00)
Glucose, Bld: 87 mg/dL (ref 65–99)
Potassium: 4.6 mmol/L (ref 3.5–5.1)
Sodium: 134 mmol/L — ABNORMAL LOW (ref 135–145)
Total Bilirubin: 1 mg/dL (ref 0.3–1.2)
Total Protein: 4.1 g/dL — ABNORMAL LOW (ref 6.5–8.1)

## 2017-03-26 LAB — URINALYSIS, ROUTINE W REFLEX MICROSCOPIC
BILIRUBIN URINE: NEGATIVE
GLUCOSE, UA: NEGATIVE mg/dL
HGB URINE DIPSTICK: NEGATIVE
KETONES UR: NEGATIVE mg/dL
Leukocytes, UA: NEGATIVE
Nitrite: NEGATIVE
PROTEIN: NEGATIVE mg/dL
Specific Gravity, Urine: 1.019 (ref 1.005–1.030)
pH: 5 (ref 5.0–8.0)

## 2017-03-26 LAB — TROPONIN I
TROPONIN I: 0.05 ng/mL — AB (ref <0.03)
TROPONIN I: 0.04 ng/mL — AB (ref <0.03)

## 2017-03-26 LAB — PREPARE RBC (CROSSMATCH)

## 2017-03-26 MED ORDER — SODIUM CHLORIDE 0.9% FLUSH
10.0000 mL | INTRAVENOUS | Status: DC | PRN
  Administered 2017-03-27: 10 mL
  Filled 2017-03-26: qty 40

## 2017-03-26 MED ORDER — SODIUM CHLORIDE 0.9 % IV SOLN
Freq: Once | INTRAVENOUS | Status: AC
  Administered 2017-03-26: 18:00:00 via INTRAVENOUS

## 2017-03-26 MED ORDER — TAMSULOSIN HCL 0.4 MG PO CAPS
0.4000 mg | ORAL_CAPSULE | Freq: Every day | ORAL | Status: DC
  Administered 2017-03-26 – 2017-03-27 (×2): 0.4 mg via ORAL
  Filled 2017-03-26 (×2): qty 1

## 2017-03-26 MED ORDER — SODIUM CHLORIDE 0.9 % IV BOLUS (SEPSIS)
500.0000 mL | Freq: Once | INTRAVENOUS | Status: AC
  Administered 2017-03-26: 500 mL via INTRAVENOUS

## 2017-03-26 MED ORDER — KCL IN DEXTROSE-NACL 40-5-0.9 MEQ/L-%-% IV SOLN
INTRAVENOUS | Status: DC
  Administered 2017-03-26: 11:00:00 via INTRAVENOUS
  Filled 2017-03-26: qty 1000

## 2017-03-26 MED ORDER — OXYCODONE HCL 5 MG PO TABS
15.0000 mg | ORAL_TABLET | ORAL | Status: DC | PRN
  Administered 2017-03-27: 15 mg via ORAL
  Filled 2017-03-26: qty 3

## 2017-03-26 MED ORDER — HYDROMORPHONE HCL 1 MG/ML IJ SOLN
1.0000 mg | INTRAMUSCULAR | Status: DC | PRN
  Administered 2017-03-26 – 2017-03-28 (×4): 1 mg via INTRAVENOUS
  Filled 2017-03-26 (×4): qty 1

## 2017-03-26 MED ORDER — BOOST PLUS PO LIQD
237.0000 mL | Freq: Three times a day (TID) | ORAL | Status: DC
  Administered 2017-03-26 – 2017-03-27 (×2): 237 mL via ORAL
  Filled 2017-03-26 (×8): qty 237

## 2017-03-26 NOTE — Progress Notes (Signed)
Initial Nutrition Assessment  DOCUMENTATION CODES:   Severe malnutrition in context of chronic illness  INTERVENTION:    Boost Plus chocolate TID- Each supplement provides 360kcal and 14g protein.    Provide MVI daily  NUTRITION DIAGNOSIS:   Severe Malnutrition related to chronic illness, cancer and cancer related treatments as evidenced by energy intake < or equal to 75% for > or equal to 1 month, severe fat depletion, severe muscle depletion, 20.6% weight loss in 7 months.  GOAL:   Patient will meet greater than or equal to 90% of their needs  MONITOR:   PO intake, Supplement acceptance, Weight trends, Labs  REASON FOR ASSESSMENT:   Consult Assessment of nutrition requirement/status  ASSESSMENT:   Pt with PMH significant for diffuse large B-cell lymphoma s/p chemotherapy/radiation followed by Dr. Irene Limbo, dyslipidemia, HTN, mitral valve prolapse, and severe protein calorie malnutrition. Presents this admission with cancer associated generalized body pain with physical deconditioning.    Pt unable to answer questions at time of visit. Spoke with daughter at bedside. Daughter reports pt had poor PO intake for 3 weeks. States throughout chemo/radiation pt was eating normally but intake has declined steadily since her last radiation treatment. Daughter reports pt typically picks throughout the day and all of her food combined maybe adds up to one meal per day. Daughter attempted to begin Boost at home, pt enjoys them but states they make her full. Amendable to trying while in hospital. Discussed the importance of protein intake for preservation of lean body mass. Included protein supplement information in discharge instructions. Family showed RD breakfast tray with 1/4 of the plate consumed. Will continue to encourage PO intake.   Daughter reports pt's UBW stays around 120 lb the last time being at that weight 7 months ago in May 2018. Records indicate pt has lost 20.6% of body weight  in 7 months (121 lb 08/2016 to 96 lb 03/2017). This is significant for time frame.   Nutrition-Focused physical exam completed. Pt is having a hard time walking with walker. Suspect some depletion may be a result of immobility along with poor PO intake.   Medications reviewed and include: colace, mag oxide, MVI with minerals Labs reviewed: Na 134 (L) AST 73 (H)   NUTRITION - FOCUSED PHYSICAL EXAM:    Most Recent Value  Orbital Region  Severe depletion  Upper Arm Region  Severe depletion  Thoracic and Lumbar Region  Unable to assess  Buccal Region  Severe depletion  Temple Region  Severe depletion  Clavicle Bone Region  Severe depletion  Clavicle and Acromion Bone Region  Severe depletion  Scapular Bone Region  Unable to assess  Dorsal Hand  Severe depletion  Patellar Region  Severe depletion  Anterior Thigh Region  Severe depletion  Posterior Calf Region  Severe depletion  Edema (RD Assessment)  None  Hair  Reviewed  Eyes  Reviewed  Mouth  Reviewed  Skin  Reviewed  Nails  Reviewed       Diet Order:  Diet regular Room service appropriate? Yes; Fluid consistency: Thin  EDUCATION NEEDS:   Not appropriate for education at this time  Skin:  Skin Assessment: Reviewed RN Assessment  Last BM:  03/24/17  Height:   Ht Readings from Last 1 Encounters:  03/26/17 4\' 11"  (1.499 m)    Weight:   Wt Readings from Last 1 Encounters:  03/26/17 96 lb 12.5 oz (43.9 kg)    Ideal Body Weight:  44.8 kg  BMI:  Body mass index  is 19.55 kg/m.  Estimated Nutritional Needs:   Kcal:  1500-1700 kcal/day  Protein:  70-80 g/day  Fluid:  >1.5 L/day    Mariana Single RD, LDN Clinical Nutrition Pager # - [phone]

## 2017-03-26 NOTE — Discharge Instructions (Signed)
Nutrition Post Hospital Stay Proper nutrition can help your body recover from illness and injury.   Foods and beverages high in protein, vitamins, and minerals help rebuild muscle loss, promote healing, & reduce fall risk.   In addition to eating healthy foods, a nutrition shake is an easy, delicious way to get the nutrition you need during and after your hospital stay  It is recommended that you continue to drink 2 bottles per day of: Boost Plus for at least 1 month (30 days) after your hospital stay   Tips for adding a nutrition shake into your routine: As allowed, drink one with vitamins or medications instead of water or juice Enjoy one as a tasty mid-morning or afternoon snack Drink cold or make a milkshake out of it Drink one instead of milk with cereal or snacks Use as a coffee creamer   Available at the following grocery stores and pharmacies:           * [employer] * [employer] * [employer]  * R[employer] Aid          * Walmart * [employer]  * Wal[facility]reens      * Target  * BJ's   * [employer]S  * [employer]   * [facility] [phone]            For COUPONS visit: [email or web address] or [email or web address]   Suggested Substitutions Ensure Plus = Boost Plus = Carnation Breakfast Essentials = Boost Compact Ensure Active Clear = Boost Breeze Glucerna Shake = Boost Glucose Control = Carnation Breakfast Essentials SUGAR FREE     

## 2017-03-26 NOTE — Progress Notes (Addendum)
PROGRESS NOTE  Christina Peterson  NTZ:[id number] DOB: 1941-01-28 DOA: 03/24/2017 PCP: Christina Panning, NP  Brief Narrative:   Christina Peterson is a 75 y.o. female with diffuse large B-cell lymphoma status post chemotherapy and radiation therapy follows up with Christina Peterson, dyslipidemia, hypertension, mitral valve prolapse, severe protein calorie malnutrition, who presented with worsening generalized weakness associated with generalized body pain.  Patient was accompanied by her 2 daughters in ER.  Patient lives by herself and she is gradually declining especially her functional status and her appetite. She usually walks with the help of walker but recently too weak to walk.  Has nausea but no vomiting.  Denies shortness of breath, cough.  CXR negative.  UA negative.    Assessment & Plan:  Diffuse large B-cell lymphoma with associated anemia, thrombocytopenia, generalized pain, generalized weakness.  No evidence of underlying infection.  Patient is anemic and oncology is recommending blood transfusion to see if that helps with her energy. -  Increase oxycodone to 15mg  po q3h prn parin -  Use IV Dilaudid for breakthrough pain only -If pain is not controlled on this regimen, consider palliative care consultation for symptom management -  Continue Robaxin -  PT recommending SNF, but family declines -  Oncology consultation to assist with Christina Peterson.  Pt has received chemo and completed XRT on 11/16. -  PET/CT in 2 weeks -  Follow up as outpatient early next week with Christina Peterson -  Transfuse 2 units of packed red blood cells -  Repeat CBC in the morning  Severe protein calorie malnutrition: -  Appreciate nutrition assistance - Diet liberalized, supplements ordered - Continue dexamethasone for appetite stimulation  Possible dysphagia -  SLP evaluation  Goals of care discussion: Christina Peterson, admitting physician, had a long discussion about CODE STATUS and goals of care with the patient and her daughters  at bedside.  Patient would like to be full code.  Continue supportive care.  Mild elevation in troponin likely demand ischemia, troponins flat and inconsistent with ACS -  Echo done in October 2018 consistent with EF of 55-60%, will not repeat.  She has severe MR which I recommend to follow-up with cardiologist outpatient. -Do not think patient has PE.  D-dimer will be not helpful given malignancy.  Mild hyponatremia, mild transaminase, chronic normocytic anemia likely due to malignancy, chemotherapy, mild leukocytosis noted in the lab.  Continue to monitor.   Acute urinary retention, likely related to pain medication -  Start flomax  -  Place foley catheter  DVT prophylaxis:  lovenox and SCDs Code Status:  Full code Family Communication:  Patient, her two daughters, and her grandson Disposition Plan: Anticipate she will be discharged to home in a few days, possibly with Foley catheter if she fails a voiding trial.  She will need extensive home health services and close outpatient follow-up.  I have placed a consult for social worker to provide them with information about transportation services.  She would likely need assistance with transportation to home.  Family states that they will be able to provide care for her.  Consultants:   Spoke with Christina Peterson by phone, Oncology  Procedures:  none  Antimicrobials:  Anti-infectives (From admission, onward)   None       Subjective: Unintelligible at times.  Complaining of pain and nausea  Objective: Vitals:   03/26/17 0252 03/26/17 0254 03/26/17 0652 03/26/17 1337  BP:  97/62 104/71 (!) 94/59  Pulse:  93 95 89  Resp:   18 18  Temp:  99 F (37.2 C) 98 F (36.7 C) 98.9 F (37.2 C)  TempSrc:  Oral Oral Oral  SpO2:  93% 95% 98%  Weight: 43.9 kg (96 lb 12.5 oz)     Height: 4\' 11"  (1.499 m)       Intake/Output Summary (Last 24 hours) at 03/26/2017 1507 Last data filed at 03/26/2017 0600 Gross per 24 hour  Intake 2008.33 ml    Output 100 ml  Net 1908.33 ml   Filed Weights   03/23/2017 1436 03/26/17 0252  Weight: 34 kg (75 lb) 43.9 kg (96 lb 12.5 oz)    Examination:  General exam: Cachectic female, ill-appearing.  No acute distress.  Unable to sit up in bed HEENT:  NCAT, MMM Respiratory system: Clear to auscultation bilaterally Cardiovascular system: Regular rate and rhythm anteriorly  Warm extremities Gastrointestinal system: Normal active bowel sounds, soft, nondistended, nontender. MSK:  Normal tone and bulk, 2+ pitting bilateral lower extremity edema Neuro:  Grossly intact    Data Reviewed: I have personally reviewed following labs and imaging studies  CBC: Recent Labs  Lab 04/03/2017 1513 03/26/17 0256  WBC 10.9* 10.3  NEUTROABS 8.0*  --   HGB 8.0* 8.1*  HCT 24.9* 25.0*  MCV 83.3 83.6  PLT 79* 77*   Basic Metabolic Panel: Recent Labs  Lab 04/04/2017 1513 03/26/17 0256  NA 134* 134*  K 4.0 4.6  CL 96* 101  CO2 25 22  GLUCOSE 89 87  BUN 21* 17  CREATININE 0.57 0.58  CALCIUM 9.7 9.4   GFR: Estimated Creatinine Clearance: 40.2 mL/min (by C-G formula based on SCr of 0.58 mg/dL). Liver Function Tests: Recent Labs  Lab 04/10/2017 1513 03/26/17 0256  AST 77* 73*  ALT 30 30  ALKPHOS 120 124  BILITOT 0.8 1.0  PROT 4.5* 4.1*  ALBUMIN 2.3* 2.1*   No results for input(s): LIPASE, AMYLASE in the last 168 hours. No results for input(s): AMMONIA in the last 168 hours. Coagulation Profile: No results for input(s): INR, PROTIME in the last 168 hours. Cardiac Enzymes: Recent Labs  Lab 03/27/2017 1513 04/14/2017 2041 03/26/17 0256 03/26/17 1041  TROPONINI 0.05* 0.06* 0.05* 0.04*   BNP (last 3 results) No results for input(s): PROBNP in the last 8760 hours. HbA1C: No results for input(s): HGBA1C in the last 72 hours. CBG: No results for input(s): GLUCAP in the last 168 hours. Lipid Profile: No results for input(s): CHOL, HDL, LDLCALC, TRIG, CHOLHDL, LDLDIRECT in the last 72  hours. Thyroid Function Tests: No results for input(s): TSH, T4TOTAL, FREET4, T3FREE, THYROIDAB in the last 72 hours. Anemia Panel: No results for input(s): VITAMINB12, FOLATE, FERRITIN, TIBC, IRON, RETICCTPCT in the last 72 hours. Urine analysis:    Component Value Date/Time   COLORURINE YELLOW 03/26/2017 [place] 03/26/2017 1348   LABSPEC 1.019 03/26/2017 1348   PHURINE 5.0 03/26/2017 1348   GLUCOSEU NEGATIVE 03/26/2017 1348   HGBUR NEGATIVE 03/26/2017 [place] 03/26/2017 1348   KETONESUR NEGATIVE 03/26/2017 1348   PROTEINUR NEGATIVE 03/26/2017 1348   UROBILINOGEN 0.2 04/30/2011 1145   NITRITE NEGATIVE 03/26/2017 1348   LEUKOCYTESUR NEGATIVE 03/26/2017 1348   Sepsis Labs: @LABRCNTIP (procalcitonin:4,lacticidven:4)  )No results found for this or any previous visit (from the past 240 hour(s)).    Radiology Studies: Dg Chest 2 View  Result Date: 04/06/2017 CLINICAL DATA:  Weakness for 3 days.  Bilateral hip pain. EXAM: CHEST  2 VIEW COMPARISON:  PA  and lateral chest 12/19/2017. FINDINGS: Port-A-Cath remains in place. There is cardiomegaly without edema. No consolidative process, pneumothorax or effusion. No acute bony abnormality. IMPRESSION: Cardiomegaly without acute disease. Electronically Signed   By: Inge Rise M.D.   On: 03/20/2017 17:56   Dg Hips Bilat W Or Wo Pelvis 2 Views  Result Date: 03/15/2017 CLINICAL DATA:  Weakness bilateral hip pain for 3 days. No known injury. EXAM: DG HIP (WITH OR WITHOUT PELVIS) 2V BILAT COMPARISON:  None. FINDINGS: There is no acute bony or joint abnormality. 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 Moderate to moderately severe degenerative disease about the right hip is seen. The patient has bilateral sacroiliitis which appears remote and worse on the left. Soft tissues are unremarkable. IMPRESSION: No acute abnormality. 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 Chronic left worse than right  sacroiliitis. Electronically Signed   By: Inge Rise M.D.   On: 04/14/2017 17:57     Scheduled Meds: . dexamethasone  2 mg Oral Q breakfast  . docusate sodium  100 mg Oral Q12H  . enoxaparin (LOVENOX) injection  30 mg Subcutaneous Q24H  . hyaluronate sodium  1 application Topical BID  . lactose free nutrition  237 mL Oral TID BM  . magnesium oxide  400 mg Oral BID  . methocarbamol  500 mg Oral TID  . multivitamin with minerals  1 tablet Oral Daily  . pantoprazole  20 mg Oral Daily  . sucralfate  1 g Oral QID   Continuous Infusions: . dextrose 5 % and 0.9 % NaCl with KCl 40 mEq/L 100 mL/hr at 03/26/17 1100     LOS: 0 days    Time spent: 30 min    [name], MD [facility] Pager [phone]  If 7PM-7AM, please contact night-coverage [email or web address] Password [email or web address] 03/26/2017, 3:07 PM

## 2017-03-26 NOTE — Care Management Note (Signed)
Case Management Note  Patient Details  Name: STEFANIA GOULART MRN: [id number] Date of Birth: 1940-04-22  Subjective/Objective: 76 y/o f admitted w/Cancer pain. From home. PT recc SNF-patient declined SNF, wants home w/HHC. CSW updated. Will provide [place] agency list-await choice.                   Action/Plan:d/c home w/HHC.   Expected Discharge Date:  (unknown)               Expected Discharge Plan:  [place]  In-House Referral:  Clinical Social Work  Discharge planning Services  CM Consult  Post Acute Care Choice:    Choice offered to:  Adult Children  DME Arranged:    DME Agency:     HH Arranged:    HH Agency:     Status of Service:  In process, will continue to follow  If discussed at Long Length of Stay Meetings, dates discussed:    Additional Comments:  Dessa Phi, RN 03/26/2017, 1:33 PM

## 2017-03-26 NOTE — Evaluation (Signed)
Physical Therapy Evaluation Patient Details Name: Christina Peterson MRN: [id number] DOB: 04-15-1941 Today's Date: 03/26/2017   History of Present Illness  Pt is a 76 y.o. female with medical history significant of HTN, HLD, severe mitral regurgitation, non-Hodgkin's lymphoma and admitted for cancer associated generalized body pain associated with physical deconditioning, generalized weakness and severe protein calorie malnutrition:  Clinical Impression  Pt admitted with above diagnosis. Pt currently with functional limitations due to the deficits listed below (see PT Problem List).  Pt will benefit from skilled PT to increase their independence and safety with mobility to allow discharge to the venue listed below.  Pt agreeable to attempt mobility however a little lethargic due to pain meds given previously.  Pt assisted to standing however requiring significant assist at this time.  Recommend SNF upon d/c if family unable to provide assistance.     Follow Up Recommendations SNF;Supervision/Assistance - 24 hour    Equipment Recommendations  Wheelchair cushion (measurements PT);Wheelchair (measurements PT)    Recommendations for Other Services       Precautions / Restrictions Precautions Precautions: Fall Restrictions Weight Bearing Restrictions: No      Mobility  Bed Mobility Overal bed mobility: Needs Assistance Bed Mobility: Supine to Sit;Sit to Supine     Supine to sit: Total assist Sit to supine: Total assist   General bed mobility comments: pt attempting to assist however very weak and appears in pain (premedicated per family), agreeable to attempt standing  Transfers Overall transfer level: Needs assistance Equipment used: Rolling walker (2 wheeled) Transfers: Sit to/from Stand Sit to Stand: Max assist         General transfer comment: verbal cues for technqiue, pt unable to physically assist on first attempt however able to assist a little on second attempt, unable  to lift feet from floor, requested return to sitting  Ambulation/Gait                Stairs            Wheelchair Mobility    Modified Rankin (Stroke Patients Only)       Balance                                             Pertinent Vitals/Pain Pain Assessment: Faces Faces Pain Scale: Hurts even more Pain Location: back Pain Descriptors / Indicators: Grimacing Pain Intervention(s): Limited activity within patient's tolerance;Repositioned;Premedicated before session;Monitored during session    Home Living Family/patient expects to be discharged to:: Private residence Living Arrangements: Alone   Type of Home: Apartment Home Access: Elevator     Home Layout: One level Home Equipment: Mining engineer - 4 wheels Additional Comments: family present and reports pt has been requiring more assist, used be ambulatory with walker    Prior Function Level of Independence: Independent with assistive device(s)               Hand Dominance        Extremity/Trunk Assessment   Upper Extremity Assessment Upper Extremity Assessment: Generalized weakness(bil hand deformities observed likely from arthritis, difficulty gripping)    Lower Extremity Assessment Lower Extremity Assessment: Generalized weakness       Communication   Communication: No difficulties  Cognition Arousal/Alertness: Lethargic;Suspect due to medications Behavior During Therapy: [facility] for tasks assessed/performed Overall Cognitive Status: Within Functional Limits for tasks assessed  General Comments      Exercises     Assessment/Plan    PT Assessment Patient needs continued PT services  PT Problem List Decreased strength;Decreased mobility;Decreased activity tolerance;Decreased knowledge of use of DME;Decreased balance       PT Treatment Interventions Gait training;DME instruction;Therapeutic  activities;Therapeutic exercise;Patient/family education;Functional mobility training;Wheelchair mobility training    PT Goals (Current goals can be found in the Care Plan section)  Acute Rehab PT Goals PT Goal Formulation: With patient/family Time For Goal Achievement: 04/02/17 Potential to Achieve Goals: Fair    Frequency Min 3X/week   Barriers to discharge        Co-evaluation               AM-PAC PT "6 Clicks" Daily Activity  Outcome Measure Difficulty turning over in bed (including adjusting bedclothes, sheets and blankets)?: Unable Difficulty moving from lying on back to sitting on the side of the bed? : Unable Difficulty sitting down on and standing up from a chair with arms (e.g., wheelchair, bedside commode, etc,.)?: Unable Help needed moving to and from a bed to chair (including a wheelchair)?: Total Help needed walking in hospital room?: Total Help needed climbing 3-5 steps with a railing? : Total 6 Click Score: 6    End of Session Equipment Utilized During Treatment: Gait belt Activity Tolerance: Patient limited by fatigue Patient left: in bed;with call bell/phone within reach;with bed alarm set;with family/visitor present Nurse Communication: Mobility status PT Visit Diagnosis: Difficulty in walking, not elsewhere classified (R26.2);Muscle weakness (generalized) (M62.81)    Time: 8182-9937 PT Time Calculation (min) (ACUTE ONLY): 18 min   Charges:   PT Evaluation $PT Eval Moderate Complexity: 1 Mod     PT G Codes:   PT G-Codes **NOT FOR INPATIENT CLASS** Functional Assessment Tool Used: AM-PAC 6 Clicks Basic Mobility;Clinical judgement Functional Limitation: Mobility: Walking and moving around Mobility: Walking and Moving Around Current Status (J6967): At least 80 percent but less than 100 percent impaired, limited or restricted Mobility: Walking and Moving Around Goal Status [phone]): At least 40 percent but less than 60 percent impaired, limited or  restricted    Carmelia Bake, PT, DPT 03/26/2017 Pager: [phone]  York Ram E 03/26/2017, 1:25 PM

## 2017-03-26 NOTE — ED Notes (Signed)
Bed assigned @0203  1421

## 2017-03-26 NOTE — Progress Notes (Addendum)
Patients temp. 101.1 after first blood transfusion complete. Lamar Blinks NP made aware. New orders received.  2249-- 650mg  of Tylenol and 551mL bolus of NS given.

## 2017-03-26 NOTE — Care Management Obs Status (Signed)
[place] NOTIFICATION   Patient Details  Name: Christina Peterson MRN: [id number] Date of Birth: [birth year]   Medicare Observation Status Notification Given:  Yes    MahabirJuliann Pulse, RN 03/26/2017, 1:32 PM

## 2017-03-26 NOTE — Progress Notes (Signed)
OT Cancellation Note  Patient Details Name: KANAYA GUNNARSON MRN: [id number] DOB: [birth year]   Cancelled Treatment:    Reason Eval/Treat Not Completed: Fatigue/lethargy limiting ability to participate  Will check on pt next day Kari Baars, [place]  Payton Mccallum D 03/26/2017, 1:11 PM

## 2017-03-26 NOTE — Progress Notes (Signed)
[name] Kitchen   HEMATOLOGY/ONCOLOGY INPATIENT PROGRESS NOTE  Date of Service: 03/26/2017  Inpatient Attending: .[name], MD  SUBJECTIVE  I was asked to see the patient in the hospital by Dr. Sheran Fava.  Ms. Knodel got admitted yesterday with progressive fatigue failure to thrive and decreasing appetite.  She had completed palliative radiation therapy on 02/28/2017. She previously was given a referral for home health services but this coverage was declined since she was ambulatory and functioning better at the time. She notes that her appetite has dropped off after radiation.  She notes some generalized abdominal soreness without any focal tenderness.  Notes very poor appetite. No fevers or chills.  No overt focal symptoms of an acute infection. She is noted to have worsening anemia and thrombocytopenia. She has had very poor appetite for the last couple of weeks with a decrease in her albumin levels.   OBJECTIVE:   Patient appears lethargic and cachectic  PHYSICAL EXAMINATION: . Vitals:   03/26/17 0254 03/26/17 0652 03/26/17 1337 03/26/17 1827  BP: 97/62 104/71 (!) 94/59 (!) 94/53  Pulse: 93 95 89 (!) 102  Resp:  18 18 18   Temp: 99 F (37.2 C) 98 F (36.7 C) 98.9 F (37.2 C) 98.9 F (37.2 C)  TempSrc: Oral Oral Oral Oral  SpO2: 93% 95% 98% 98%  Weight:      Height:       Filed Weights   03/17/2017 1436 03/26/17 0252  Weight: 75 lb (34 kg) 96 lb 12.5 oz (43.9 kg)   .Body mass index is 19.55 kg/m.  GENERAL:alert, appears lethargic cachectic and tends to not want to talk. SKIN: No acute rashes EYES: Conjunctival pallor noted  OROPHARYNX:no exudate, no overt thrush. NECK: supple LYMPH:  no palpable lymphadenopathy in the cervical, axillary or inguinal LUNGS: clear to auscultation with normal respiratory effort HEART: regular rate & rhythm,  no murmurs and no lower extremity edema ABDOMEN: abdomen mildly distended , nonfocal mild tenderness to palpation , normoactive bowel  sounds  musculoskeletal: No pedal edema NEURO: no focal motor/sensory deficits  MEDICAL HISTORY:  Past Medical History:  Diagnosis Date  . Arthritis   . Diffuse large B-cell lymphoma of lymph nodes of multiple regions ([place]) 08/22/2016  . Hypercholesteremia   . Hypertension    FOLLOWED BY DR Katherine Roan  . Mitral valve prolapse -  severe MR 09/20/2016   Posterior Leaflet  . Severe mitral regurgitation: Per 2 D echo 08/23/2016 08/23/2016   .posterior leaflet prolapse of mitral valve with anteriorly directed mitral regurgitation. Severe. Severely dilated left atrium.  . Severe protein-calorie malnutrition ([place])     SURGICAL HISTORY: Past Surgical History:  Procedure Laterality Date  . ABDOMINAL HYSTERECTOMY    . IR FLUORO GUIDE PORT INSERTION RIGHT  08/23/2016  . IR US GUIDE VASC ACCESS RIGHT  08/23/2016  . JOINT REPLACEMENT    . 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 TOTAL HIP REVISION  05/02/2011   Procedure: TOTAL HIP REVISION;  Surgeon: Sharmon Revere, MD;  Location: [facility];  Service: Orthopedics;  Laterality: Left;  left total hip revision with bone grafting  . TRANSTHORACIC ECHOCARDIOGRAM  08/23/2013    EF 60-65%. GR 2 DD.posterior leaflet prolapse of mitral valve with anteriorly directed mitral regurgitation. Severe. Severely dilated left atrium.    SOCIAL HISTORY: Social History   Socioeconomic History  . Marital status: Legally Separated    Spouse name: Not on file  . Number of children: 4  . Years  of education: 55  . Highest education level: Not on file  Social Needs  . Financial resource strain: Not on file  . Food insecurity - worry: Not on file  . Food insecurity - inability: Not on file  . Transportation needs - medical: Not on file  . Transportation needs - non-medical: Not on file  Occupational History  . Not on file  Tobacco Use  . Smoking status: Never Smoker  . Smokeless tobacco: Never Used  Substance and Sexual Activity  . Alcohol use: No  . Drug  use: No  . Sexual activity: Not on file  Other Topics Concern  . Not on file  Social History Narrative   She lives alone in an apartment.  She relies on family members for transportation & helping her figure out her medications.   She has listed married, but reportedly she is legally separated.   They have 4 children and 5 grandchildren and 5 great-grandchildren.   Has Social Security & DSS assistance with food stamps & food banks.      FAMILY HISTORY: Family History  Problem Relation Age of Onset  . Heart disease Mother   . Hypertension Mother   . Heart attack Mother 64  . Hypertension Father   . Brain cancer Father   . Brain cancer Sister   . Hypertension Sister   . Stroke Sister   . Alcohol abuse Brother   . Depression Daughter   . Hypertension Daughter   . Thyroid disease Daughter   . Migraines Daughter     ALLERGIES:  has No Known Allergies.  MEDICATIONS:  Scheduled Meds: . dexamethasone  2 mg Oral Q breakfast  . docusate sodium  100 mg Oral Q12H  . enoxaparin (LOVENOX) injection  30 mg Subcutaneous Q24H  . hyaluronate sodium  1 application Topical BID  . lactose free nutrition  237 mL Oral TID BM  . magnesium oxide  400 mg Oral BID  . methocarbamol  500 mg Oral TID  . multivitamin with minerals  1 tablet Oral Daily  . pantoprazole  20 mg Oral Daily  . sucralfate  1 g Oral QID  . tamsulosin  0.4 mg Oral QPC supper   Continuous Infusions: PRN Meds:.acetaminophen **OR** acetaminophen, HYDROmorphone (DILAUDID) injection, loperamide, ondansetron **OR** ondansetron (ZOFRAN) IV, oxyCODONE, sodium chloride flush  REVIEW OF SYSTEMS:    10 Point review of Systems was done is negative except as noted above.   LABORATORY DATA:  I have reviewed the data as listed  . CBC Latest Ref Rng & Units 03/26/2017 03/19/2017 03/08/2017  WBC 4.0 - 10.5 K/uL 10.3 10.9(H) 3.2(L)  Hemoglobin 12.0 - 15.0 g/dL 8.1(L) 8.0(L) 9.0(L)  Hematocrit 36.0 - 46.0 % 25.0(L) 24.9(L) 28.8(L)    Platelets 150 - 400 K/uL 77(L) 79(L) 117(L)    . CMP Latest Ref Rng & Units 03/26/2017 04/05/2017 03/08/2017  Glucose 65 - 99 mg/dL 87 89 100(H)  BUN 6 - 20 mg/dL 17 21(H) 18  Creatinine 0.44 - 1.00 mg/dL 0.58 0.57 0.47  Sodium 135 - 145 mmol/L 134(L) 134(L) 134(L)  Potassium 3.5 - 5.1 mmol/L 4.6 4.0 3.5  Chloride 101 - 111 mmol/L 101 96(L) 96(L)  CO2 22 - 32 mmol/L 22 25 29   Calcium 8.9 - 10.3 mg/dL 9.4 9.7 8.3(L)  Total Protein 6.5 - 8.1 g/dL 4.1(L) 4.5(L) -  Total Bilirubin 0.3 - 1.2 mg/dL 1.0 0.8 -  Alkaline Phos 38 - 126 U/L 124 120 -  AST 15 - 41 U/L  73(H) 77(H) -  ALT 14 - 54 U/L 30 30 -     RADIOGRAPHIC STUDIES: I have personally reviewed the radiological images as listed and agreed with the findings in the report. Dg Chest 2 View  Result Date: 04/10/2017 CLINICAL DATA:  Weakness for 3 days.  Bilateral hip pain. EXAM: CHEST  2 VIEW COMPARISON:  PA and lateral chest 12/19/2017. FINDINGS: Port-A-Cath remains in place. There is cardiomegaly without edema. No consolidative process, pneumothorax or effusion. No acute bony abnormality. IMPRESSION: Cardiomegaly without acute disease. Electronically Signed   By: Inge Rise M.D.   On: 04/02/2017 17:56   Dg Esophagus  Result Date: 03/04/2017 CLINICAL DATA:  Dysphagia.  Lymphoma. EXAM: ESOPHOGRAM / BARIUM SWALLOW / BARIUM TABLET STUDY TECHNIQUE: Combined double contrast and single contrast examination performed using effervescent crystals, thick barium liquid, and thin barium liquid. The patient was observed with fluoroscopy swallowing a 13 mm barium sulphate tablet. FLUOROSCOPY TIME:  Fluoroscopy Time:  1 minutes 54 second Radiation Exposure Index (if provided by the fluoroscopic device): Number of Acquired Spot Images: 0 COMPARISON:  None. FINDINGS: Mild narrowing of the proximal esophagus at C5-6. Smooth mucosal surface. Narrowing was not appreciated on initial swallowing however the barium tablet did not pass this area despite  multiple swallows of water and barium. No diverticulum or mass is seen in this area. Mid and distal esophagus mildly dilated with decreased peristalsis. Small hiatal hernia without reflux. IMPRESSION: Mild narrowing proximal esophagus at C5-6. Barium tablet did not pass this area. Mucosa is smooth in this area which appears benign Mild decreased esophageal peristalsis.  Small hiatal hernia Electronically Signed   By: Franchot Gallo M.D.   On: 03/04/2017 11:06   Dg Hips Bilat W Or Wo Pelvis 2 Views  Result Date: 03/28/2017 CLINICAL DATA:  Weakness bilateral hip pain for 3 days. No known injury. EXAM: DG HIP (WITH OR WITHOUT PELVIS) 2V BILAT COMPARISON:  None. FINDINGS: There is no acute bony or joint abnormality. 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 Moderate to moderately severe degenerative disease about the right hip is seen. The patient has bilateral sacroiliitis which appears remote and worse on the left. Soft tissues are unremarkable. IMPRESSION: No acute abnormality. 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 Electronically Signed   By: Inge Rise M.D.   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 Patient presented with extensive retroperitoneal mesenteric and periportal lymphadenopathy along with adenopathy in the chest and supraclavicular areas and a focal lesion in the spleen.   Patient received first cycle of R-mini CHOP on 08/26/2016 and received outpatient neulasta. Patient received 2nd  cycle of R-mini CHOP on 09/26/2016 and received outpatient neulasta. Received third cycle of R-mini CHOP on 09/26/2016 and received outpatient neulasta. Received third cycle of R-mini CHOP on 10/21/2016 and received outpatient neulasta. PET scan on 11/04/2016 which shows treatment response but still with fairly active disease. C4  -anthracycline dose (doxorubicin 50 mg/m) to standard CHOP dosing and continue with dose reduced cyclophosphamide. C5 and C6 of R-CHOP with some dose as cycle 4. PET CT after 6 cycles of R-mini CHOP with Partial response. Patient has completed her palliative radiation therapy on 02/28/2017.  #2 severe protein calorie malnutrition and weight loss . Wt Readings from Last 3 Encounters:  03/26/17 96 lb 12.5 oz (43.9 kg)  03/08/17 84 lb (38.1 kg)  03/05/17 86 lb 9.6 oz (39.3 kg)   #3 worsening anemia and thrombocytopenia ?related to RT vs recurrent tumor vs poor nutrition  #4 Right lower pole of the kidney lesion ? Renal cell carcinoma versus involvement by lymphoma .  #5 significant fatigue and failure to thrive due to poor oral intake related lymphoma and type B constitutional symptoms and RT related effects. ? Disease progression. Plan -Patient is currently ECOG performance status of 3 -Hospital medicine cares by Dr. Sheran Fava appreciated. -Would recommend transfusion of 2 units of PRBCs to maintain hemoglobin above 9. -Nutritionist input to optimize oral intake. -Had an honest discussion with the patient and her family regarding questionable ability to function at home at this time. -Would recommend CT abdomen and pelvis to evaluate disease status.  If the patient has overt lymphoma progression this will help define her goals of care.  In her current performance status she would not be a candidate for additional systemic chemotherapy at this time and might be best served by pursuing the supportive cares in that situation through hospice. -If the lymphoma does not show overt progression in the abdomen would strongly recommend focusing on nutrition and physical and occupational therapy as well as optimizing other metabolic parameters to help with her functional status.  Would strongly recommend SNF placement given the patient's poor functional status.  Patient and family are undecided on this and  will make a final decision and let Dr. Sheran Fava know about it. -She will need outpatient repeat PET/CT scan in the next 3-4 weeks unless there is overt disease progression on CT abdomen pelvis. -Would get an LDH level in the morning  #6 severe mitral regurgitation normal ejection fraction -f/u with cardiology /PCP   I spent 30 minutes counseling the patient face to face. The total time spent in the appointment was 35 minutes and more than 50% was on counseling and direct patient cares.    Sullivan Lone MD [place] AAHIVMS [facility] Hematology/Oncology Physician [facility]  (Office):       [phone] (Work cell):  [phone] (Fax):           [phone]  03/26/2017 6:30 PM

## 2017-03-26 NOTE — Progress Notes (Signed)
CSW acknowledged consult for possible SNF placement. Per chart review patient declines SNF. RNCM following for home health needs. CSW signing off, no other needs identified.   Abundio Miu, [place] Social Worker [facility] Cell#: [phone]

## 2017-03-27 ENCOUNTER — Inpatient Hospital Stay (HOSPITAL_COMMUNITY): Payer: Medicare Other

## 2017-03-27 ENCOUNTER — Other Ambulatory Visit: Payer: Self-pay

## 2017-03-27 DIAGNOSIS — E43 Unspecified severe protein-calorie malnutrition: Secondary | ICD-10-CM

## 2017-03-27 DIAGNOSIS — A419 Sepsis, unspecified organism: Secondary | ICD-10-CM

## 2017-03-27 DIAGNOSIS — C8302 Small cell B-cell lymphoma, intrathoracic lymph nodes: Secondary | ICD-10-CM

## 2017-03-27 DIAGNOSIS — C641 Malignant neoplasm of right kidney, except renal pelvis: Secondary | ICD-10-CM

## 2017-03-27 DIAGNOSIS — R531 Weakness: Secondary | ICD-10-CM

## 2017-03-27 DIAGNOSIS — R6520 Severe sepsis without septic shock: Secondary | ICD-10-CM

## 2017-03-27 DIAGNOSIS — E872 Acidosis: Secondary | ICD-10-CM

## 2017-03-27 DIAGNOSIS — Z66 Do not resuscitate: Secondary | ICD-10-CM

## 2017-03-27 DIAGNOSIS — G893 Neoplasm related pain (acute) (chronic): Secondary | ICD-10-CM

## 2017-03-27 LAB — CBC
HCT: 32.1 % — ABNORMAL LOW (ref 36.0–46.0)
Hemoglobin: 10.3 g/dL — ABNORMAL LOW (ref 12.0–15.0)
MCH: 27 pg (ref 26.0–34.0)
MCHC: 32.1 g/dL (ref 30.0–36.0)
MCV: 84.3 fL (ref 78.0–100.0)
Platelets: 67 10*3/uL — ABNORMAL LOW (ref 150–400)
RBC: 3.81 MIL/uL — ABNORMAL LOW (ref 3.87–5.11)
RDW: 20.6 % — ABNORMAL HIGH (ref 11.5–15.5)
WBC: 14.3 10*3/uL — ABNORMAL HIGH (ref 4.0–10.5)

## 2017-03-27 LAB — URINE CULTURE: Culture: NO GROWTH

## 2017-03-27 LAB — HEPATIC FUNCTION PANEL
ALT: 38 U/L (ref 14–54)
AST: 109 U/L — ABNORMAL HIGH (ref 15–41)
Albumin: 2.2 g/dL — ABNORMAL LOW (ref 3.5–5.0)
Alkaline Phosphatase: 217 U/L — ABNORMAL HIGH (ref 38–126)
Bilirubin, Direct: 0.2 mg/dL (ref 0.1–0.5)
Indirect Bilirubin: 0.6 mg/dL (ref 0.3–0.9)
Total Bilirubin: 0.8 mg/dL (ref 0.3–1.2)
Total Protein: 4.5 g/dL — ABNORMAL LOW (ref 6.5–8.1)

## 2017-03-27 LAB — URINALYSIS, MICROSCOPIC (REFLEX): Squamous Epithelial / LPF: NONE SEEN

## 2017-03-27 LAB — DIFFERENTIAL
Band Neutrophils: 0 %
Basophils Absolute: 0 10*3/uL (ref 0.0–0.1)
Basophils Relative: 0 %
Blasts: 0 %
Eosinophils Absolute: 0 10*3/uL (ref 0.0–0.7)
Eosinophils Relative: 0 %
Lymphocytes Relative: 21 %
Lymphs Abs: 3 10*3/uL (ref 0.7–4.0)
Metamyelocytes Relative: 0 %
Monocytes Absolute: 0.7 10*3/uL (ref 0.1–1.0)
Monocytes Relative: 5 %
Myelocytes: 0 %
Neutro Abs: 10.6 10*3/uL — ABNORMAL HIGH (ref 1.7–7.7)
Neutrophils Relative %: 74 %
Promyelocytes Absolute: 0 %
nRBC: 0 /100 WBC

## 2017-03-27 LAB — URINALYSIS, ROUTINE W REFLEX MICROSCOPIC
Bilirubin Urine: NEGATIVE
GLUCOSE, UA: NEGATIVE mg/dL
KETONES UR: NEGATIVE mg/dL
Nitrite: NEGATIVE
PROTEIN: NEGATIVE mg/dL
Specific Gravity, Urine: 1.02 (ref 1.005–1.030)
pH: 5 (ref 5.0–8.0)

## 2017-03-27 LAB — APTT: APTT: 45 s — AB (ref 24–36)

## 2017-03-27 LAB — BASIC METABOLIC PANEL
Anion gap: 14 (ref 5–15)
BUN: 15 mg/dL (ref 6–20)
CO2: 19 mmol/L — ABNORMAL LOW (ref 22–32)
Calcium: 9.7 mg/dL (ref 8.9–10.3)
Chloride: 103 mmol/L (ref 101–111)
Creatinine, Ser: 0.64 mg/dL (ref 0.44–1.00)
GFR calc Af Amer: >60 mL/min (ref >60)
GFR calc non Af Amer: >60 mL/min (ref >60)
Glucose, Bld: 105 mg/dL — ABNORMAL HIGH (ref 65–99)
Potassium: 5 mmol/L (ref 3.5–5.1)
Sodium: 136 mmol/L (ref 135–145)

## 2017-03-27 LAB — PROTIME-INR
INR: 1.04
PROTHROMBIN TIME: 13.5 s (ref 11.4–15.2)

## 2017-03-27 LAB — PHOSPHORUS: PHOSPHORUS: 2.3 mg/dL — AB (ref 2.5–4.6)

## 2017-03-27 LAB — LACTIC ACID, PLASMA
LACTIC ACID, VENOUS: 9.4 mmol/L — AB (ref 0.5–1.9)
Lactic Acid, Venous: 10.1 mmol/L (ref 0.5–1.9)

## 2017-03-27 LAB — URIC ACID: Uric Acid, Serum: 7.5 mg/dL — ABNORMAL HIGH (ref 2.3–6.6)

## 2017-03-27 LAB — MAGNESIUM: Magnesium: 1 mg/dL — ABNORMAL LOW (ref 1.7–2.4)

## 2017-03-27 LAB — MRSA PCR SCREENING: MRSA BY PCR: NEGATIVE

## 2017-03-27 LAB — LACTATE DEHYDROGENASE: LDH: 2646 U/L — ABNORMAL HIGH (ref 98–192)

## 2017-03-27 LAB — LIPASE, BLOOD: Lipase: 11 U/L (ref 11–51)

## 2017-03-27 LAB — PROCALCITONIN: PROCALCITONIN: 2.29 ng/mL

## 2017-03-27 LAB — CORTISOL: CORTISOL PLASMA: 21.7 ug/dL

## 2017-03-27 MED ORDER — ACETAMINOPHEN 325 MG PO TABS
325.0000 mg | ORAL_TABLET | Freq: Once | ORAL | Status: AC
Start: 2017-03-27 — End: 2017-03-27
  Administered 2017-03-27: 325 mg via ORAL
  Filled 2017-03-27: qty 1

## 2017-03-27 MED ORDER — LIP MEDEX EX OINT
TOPICAL_OINTMENT | CUTANEOUS | Status: AC
  Administered 2017-03-28
  Filled 2017-03-27: qty 7

## 2017-03-27 MED ORDER — PIPERACILLIN-TAZOBACTAM 3.375 G IVPB
3.3750 g | Freq: Three times a day (TID) | INTRAVENOUS | Status: DC
  Administered 2017-03-27 – 2017-03-31 (×13): 3.375 g via INTRAVENOUS
  Filled 2017-03-27 (×13): qty 50

## 2017-03-27 MED ORDER — SODIUM CHLORIDE 0.9 % IV SOLN
500.0000 mg | INTRAVENOUS | Status: DC
  Administered 2017-03-28: 500 mg via INTRAVENOUS
  Filled 2017-03-27 (×2): qty 500

## 2017-03-27 MED ORDER — IOPAMIDOL (ISOVUE-300) INJECTION 61%
100.0000 mL | Freq: Once | INTRAVENOUS | Status: AC | PRN
  Administered 2017-03-27: 100 mL via INTRAVENOUS

## 2017-03-27 MED ORDER — IOPAMIDOL (ISOVUE-300) INJECTION 61%: 15.0000 mL | Freq: Two times a day (BID) | INTRAVENOUS | Status: DC | PRN

## 2017-03-27 MED ORDER — KCL IN DEXTROSE-NACL 20-5-0.9 MEQ/L-%-% IV SOLN
INTRAVENOUS | Status: DC
  Administered 2017-03-27: 10:00:00 via INTRAVENOUS
  Filled 2017-03-27: qty 1000

## 2017-03-27 MED ORDER — IOPAMIDOL (ISOVUE-300) INJECTION 61%
INTRAVENOUS | Status: AC
  Administered 2017-03-27: 100 mL via INTRAVENOUS
  Filled 2017-03-27: qty 100

## 2017-03-27 MED ORDER — DEXTROSE-NACL 5-0.9 % IV SOLN
INTRAVENOUS | Status: DC
  Administered 2017-03-27 – 2017-03-28 (×2): via INTRAVENOUS

## 2017-03-27 MED ORDER — SODIUM CHLORIDE 0.9 % IV BOLUS (SEPSIS)
500.0000 mL | Freq: Once | INTRAVENOUS | Status: AC
  Administered 2017-03-27: 500 mL via INTRAVENOUS

## 2017-03-27 MED ORDER — IOPAMIDOL (ISOVUE-300) INJECTION 61%
INTRAVENOUS | Status: AC
  Administered 2017-03-27: 11:00:00
  Filled 2017-03-27: qty 30

## 2017-03-27 MED ORDER — VANCOMYCIN HCL IN DEXTROSE 1-5 GM/200ML-% IV SOLN
1000.0000 mg | Freq: Once | INTRAVENOUS | Status: AC
  Administered 2017-03-27: 1000 mg via INTRAVENOUS
  Filled 2017-03-27: qty 200

## 2017-03-27 MED ORDER — SODIUM CHLORIDE 0.9 % IV BOLUS (SEPSIS)
1000.0000 mL | Freq: Once | INTRAVENOUS | Status: AC
  Administered 2017-03-27: 1000 mL via INTRAVENOUS

## 2017-03-27 MED ORDER — MAGNESIUM SULFATE 4 GM/100ML IV SOLN
4.0000 g | Freq: Once | INTRAVENOUS | Status: AC
  Administered 2017-03-27: 4 g via INTRAVENOUS
  Filled 2017-03-27 (×2): qty 100

## 2017-03-27 MED ORDER — MAGNESIUM SULFATE 2 GM/50ML IV SOLN
2.0000 g | Freq: Once | INTRAVENOUS | Status: AC
  Administered 2017-03-27: 2 g via INTRAVENOUS
  Filled 2017-03-27: qty 50

## 2017-03-27 MED FILL — Medication: Qty: 1 | Status: AC

## 2017-03-27 NOTE — Progress Notes (Signed)
Pharmacy Antibiotic Note  Christina Peterson is a 76 y.o. female with diffused large B-cell lymphoma presented to the ED on 03/21/2017 with generalized weakness.  She was febrile after first unit of blood transfusion on 12/12 and remains febrile on 12/13.  PCT elevated at 2.29 and LA at 9.4.  CXR showed findings consistent with LL PNA.  To start vancomycin and zosyn for sepsis and PNA.  Today, 03/27/2017: - Tmax 102.1, wbc up 14.3 -  scr 0.64 (crcl~40, rounded scr to 0.8 for age) -  [facility] high on 12/11  Plan: - vancomycin 1000 mg IV x1, then 500 mg IV q24h for est AUC 462 - zosyn 3.375 gm IV q8h (infuse over 4 hrs) - scr daily  _____________________________  Height: 4\' 11"  (149.9 cm) Weight: 96 lb 12.5 oz (43.9 kg) IBW/kg (Calculated) : 43.2  Temp (24hrs), Avg:100.4 F (38 C), Min:98.5 F (36.9 C), Max:102.1 F (38.9 C)  Recent Labs  Lab 03/20/2017 1513 03/26/17 0256 03/27/17 0348 03/27/17 0810  WBC 10.9* 10.3 14.3*  --   CREATININE 0.57 0.58 0.64  --   LATICACIDVEN  --   --   --  9.4*    Estimated Creatinine Clearance: 40.2 mL/min (by C-G formula based on SCr of 0.64 mg/dL).    No Known Allergies   Thank you for allowing pharmacy to be a part of this patient's care.  Lynelle Doctor 03/27/2017 9:41 AM

## 2017-03-27 NOTE — Progress Notes (Signed)
Bolus completed, patients temp now 102.1 orally, 102 rectally. BP stable. HR in 130's. Lamar Blinks, NP made aware.

## 2017-03-27 NOTE — Consult Note (Signed)
PULMONARY / CRITICAL CARE MEDICINE   Name: Christina Peterson MRN: [id number] DOB: 11/27/1940    ADMISSION DATE:  03/20/2017 CONSULTATION DATE: 03/27/2017  REFERRING MD: Edwin Cap MD  CHIEF COMPLAINT: Generalized pain and weakness  HISTORY OF PRESENT ILLNESS:   76 year old female who carries a diagnosis of diffuse large B-cell lymphoma who has had a Port-A-Cath placed and has received chemo and radiation therapy last 02/28/2017.  She is severely malnourished and was admitted on 03/29/2017 to the [facility] chief complaints of body pain generalized weakness for 3-4 days.  It appears that she has been worsening over a period of time. 03/27/2017 she was reported to be in hemodynamic distress and respiratory distress but arrived to the intensive care unit on room air normotensive and able to speak in clear sentences.  She does have a dull effect obviously ill. She is followed by cardiology for mitral valve insufficiency. She has had a change in her mental status and CT of the head is planned.  PAST MEDICAL HISTORY :  She  has a past medical history of Arthritis, Diffuse large B-cell lymphoma of lymph nodes of multiple regions ([place]) (08/22/2016), Hypercholesteremia, Hypertension, Mitral valve prolapse -  severe MR (09/20/2016), Severe mitral regurgitation: Per 2 D echo 08/23/2016 (08/23/2016), and Severe protein-calorie malnutrition ([place]).  PAST SURGICAL HISTORY: She  has a past surgical history that includes Joint replacement; Total hip arthroplasty (2010); Abdominal hysterectomy; Total hip revision (05/02/2011); IR FLUORO GUIDE PORT INSERTION RIGHT (08/23/2016); IR US Guide Vasc Access Right (08/23/2016); and transthoracic echocardiogram (08/23/2013).  No Known Allergies  Current Facility-Administered Medications on File Prior to Encounter  Medication  . ondansetron (ZOFRAN-ODT) disintegrating tablet 8 mg   Current Outpatient Medications on File Prior to Encounter  Medication  Sig  . dexamethasone (DECADRON) 2 MG tablet Take 1 tablet (2 mg total) daily with breakfast by mouth.  . docusate sodium (COLACE) 100 MG capsule Take 1 capsule (100 mg total) by mouth every 12 (twelve) hours.  . hyaluronate sodium (RADIAPLEXRX) GEL Apply 1 application topically 2 (two) times daily. Apply after rad and bedtime to affected skin area, nothing 4 hours prior rad tx  . lidocaine-prilocaine (EMLA) cream Apply to port 1-2 hours before needle access. Cover with plastic wrap.  . Loperamide HCl (IMODIUM A-D PO) Take 1-2 tablets by mouth 2 (two) times daily as needed (DIARRHEA).  . magnesium oxide (MAG-OX) 400 (241.3 Mg) MG tablet Take 1 tablet (400 mg total) by mouth 2 (two) times daily.  . non-metallic deodorant Jethro Poling) MISC Apply 1 application topically daily.  . ondansetron (ZOFRAN ODT) 4 MG disintegrating tablet 47m ODT q4 hours prn nausea/vomit  . ondansetron (ZOFRAN) 8 MG tablet Take 1 tablet (8 mg total) by mouth every 8 (eight) hours as needed for nausea or vomiting.  .[name] KitchenoxyCODONE (OXY IR/ROXICODONE) 5 MG immediate release tablet Take 1-2 tablets (5-10 mg total) by mouth every 4 (four) hours as needed for severe pain or breakthrough pain.  . pantoprazole (PROTONIX) 20 MG tablet Take 1 tablet (20 mg total) by mouth daily.  . prochlorperazine (COMPAZINE) 10 MG tablet Take 1 tablet (10 mg total) by mouth every 6 (six) hours as needed for nausea or vomiting.  . sucralfate (CARAFATE) 1 g tablet Take 1 tablet (1 g total) by mouth 4 (four) times daily.  .[name] Kitchendronabinol (MARINOL) 2.5 MG capsule Take 1 capsule (2.5 mg total) by mouth 2 (two) times daily before a meal. (Patient not taking: Reported on 03/19/2017)  .  potassium chloride SA (K-DUR,KLOR-CON) 20 MEQ tablet Take 1 tablet (20 mEq total) by mouth 2 (two) times daily. (Patient not taking: Reported on 04/08/2017)  . predniSONE (DELTASONE) 20 MG tablet Take 3 tablets (60 mg total) by mouth See admin instructions. 60 mg po daily Day 1 to Day 5 of  each chemotherapy cycle (Patient not taking: Reported on 04/06/2017)  . promethazine (PHENERGAN) 25 MG suppository Place 1 suppository (25 mg total) rectally every 6 (six) hours as needed for nausea or vomiting. (Patient not taking: Reported on 04/14/2017)  . senna-docusate (SENNA S) 8.6-50 MG tablet Take 2 tablets by mouth 2 (two) times daily. Reduce to 2tab po HS if diarrhea occurs (Patient not taking: Reported on 03/24/2017)    FAMILY HISTORY:  Her indicated that her mother is deceased. She indicated that her father is deceased. She indicated that all of her six sisters are alive. She indicated that all of her four brothers are deceased. She indicated that her maternal grandmother is alive. She indicated that her maternal grandfather is alive. She indicated that her paternal grandmother is alive. She indicated that her paternal grandfather is alive. She indicated that all of her three daughters are alive. She indicated that her son is alive.   SOCIAL HISTORY: She  reports that  has never smoked. she has never used smokeless tobacco. She reports that she does not drink alcohol or use drugs.  REVIEW OF SYSTEMS:   Limited at this time.  Reports generalized weakness and pain.  SUBJECTIVE:  76 year old female B cell lymphoma transferred to intensive care unit 03/27/2017 not currently in acute distress.  VITAL SIGNS: BP 111/65 (BP Location: Left Arm)   Pulse (!) 113   Temp 98.5 F (36.9 C) (Oral)   Resp (!) 24   Ht 4' 11"  (1.499 m)   Wt 43.9 kg (96 lb 12.5 oz)   SpO2 96%   BMI 19.55 kg/m   HEMODYNAMICS:    VENTILATOR SETTINGS:    INTAKE / OUTPUT: I/O last 3 completed shifts: In: 1868.3 [P.O.:360; I.V.:1078.3; Blood:430] Out: 0300 [[id number]]  PHYSICAL EXAMINATION: General: Frail cachectic female in no acute distress Neuro: Dull effect able to carry on conversations.  Denies difficulty breathing or pain other than generalized pain discomfort HEENT: No JVD or lymphadenopathy  is appreciated Oropharynx is unremarkable Cardiovascular: Heart sounds are distant sinus tach at 122,murmur Lungs: Decreased breath sounds throughout right Port-A-Cath is noted Abdomen: Tense decreased bowel sounds Musculoskeletal: Muscle wasting is noted Skin: Cool and dry  LABS:  BMET Recent Labs  Lab 04/12/2017 1513 03/26/17 0256 03/27/17 0348  NA 134* 134* 136  K 4.0 4.6 5.0  CL 96* 101 103  CO2 25 22 19*  BUN 21* 17 15  CREATININE 0.57 0.58 0.64  GLUCOSE 89 87 105*    Electrolytes Recent Labs  Lab 03/19/2017 1513 03/26/17 0256 03/27/17 0348  CALCIUM 9.7 9.4 9.7  MG  --   --  1.0*    CBC Recent Labs  Lab 03/30/2017 1513 03/26/17 0256 03/27/17 0348  WBC 10.9* 10.3 14.3*  HGB 8.0* 8.1* 10.3*  HCT 24.9* 25.0* 32.1*  PLT 79* 77* 67*    Coag's Recent Labs  Lab 03/27/17 0810  APTT 45*  INR 1.04    Sepsis Markers Recent Labs  Lab 03/27/17 0810  LATICACIDVEN 9.4*  PROCALCITON 2.29    ABG No results for input(s): PHART, PCO2ART, PO2ART in the last 168 hours.  Liver Enzymes Recent Labs  Lab 03/29/2017 1513 03/26/17 0256  03/27/17 0348  AST 77* 73* 109*  ALT 30 30 38  ALKPHOS 120 124 217*  BILITOT 0.8 1.0 0.8  ALBUMIN 2.3* 2.1* 2.2*    Cardiac Enzymes Recent Labs  Lab 03/23/2017 2041 03/26/17 0256 03/26/17 1041  TROPONINI 0.06* 0.05* 0.04*    Glucose No results for input(s): GLUCAP in the last 168 hours.  Imaging Dg Chest Port 1 View  Result Date: 03/27/2017 CLINICAL DATA:  Fever.  Large B-cell lymphoma EXAM: PORTABLE CHEST 1 VIEW COMPARISON:  March 25, 2017 FINDINGS: Port-A-Cath tip at cavoatrial junction. Huber needle is position within the port. No pneumothorax. There is airspace consolidation in the left lower lobe and probable inferior lingula with left pleural effusion. There is atelectatic change in the right base. Heart is mildly enlarged with pulmonary vascularity within normal limits. No adenopathy. There is degenerative change  in each shoulder. IMPRESSION: Port-A-Cath as described without pneumothorax. Airspace consolidation left lower lobe and probable inferior lingula consistent with pneumonia. Small left pleural effusion. There is right base atelectasis. Cardiac silhouette is stable. Electronically Signed   By: Lowella Grip III M.D.   On: 03/27/2017 09:29     STUDIES:  03/27/2017 CT of the abdomen with contrast>> 03/27/2017 CT of the head>>  CULTURES: 03/27/2017 blood cultures x2 03/27/2017 urine culture  ANTIBIOTICS: 03/27/2017 vancomycin 03/27/2017 Zosyn  SIGNIFICANT EVENTS: 08/2016 diagnosed with B-cell lymphoma  LINES/TUBES: Right Port-A-Cath  DISCUSSION: 76 year old female who carries a diagnosis of diffuse large B-cell lymphoma who has had a Port-A-Cath placed and has received chemo and radiation therapy last 02/28/2017.  She is severely malnourished and was admitted on 04/03/2017 to the [facility] chief complaints of body pain generalized weakness for 3-4 days.  It appears that she has been worsening over a period of time. 03/27/2017 she was reported to be in hemodynamic distress and respiratory distress but arrived to the intensive care unit on room air normotensive and able to speak in clear sentences.  She does have a dull effect obviously ill. P CCM asked to evaluate 03/27/2017  ASSESSMENT / PLAN:  PULMONARY A: Suspected left lower lobe pneumonia Not requiring supplemental oxygen at this time P:   O2 as needed Antibiotic per ID section  CARDIOVASCULAR A:  Sinus tachycardia normal tensive Mitral valve regurgitation followed by Dr. Ellyn Hack of our cardiology. P:  Follow-up vital signs close She has been moved to stepdown unit.  RENAL Lab Results  Component Value Date   CREATININE 0.64 03/27/2017   CREATININE 0.58 03/26/2017   CREATININE 0.57 03/21/2017   CREATININE 0.6 02/21/2017   CREATININE 0.7 01/24/2017   CREATININE 0.7 12/23/2016   Recent Labs  Lab  04/05/2017 1513 03/26/17 0256 03/27/17 0348  K 4.0 4.6 5.0    A:   Lactic acidosis with lactate of 9 P:   Monitor renal electrolyte function CT of abdomen for possible source of infection  GASTROINTESTINAL A:   Malnutrition most likely from her B cell lymphoma complicated by chemo and radiation therapy P:   Nutrition is able  HEMATOLOGIC Recent Labs    03/26/17 0256 03/27/17 0348  HGB 8.1* 10.3*  Note she is to have a CT of the abdomen 03/27/2017 her abdomen is tense she may have some type of ischemic event or blockage in her abdomen. N.p.o.   A:   Stage IIIb diffuse large B-cell lymphoma she is completed chemotherapy of our-mini CHOP with partial response. Repleted palliative radiation therapy 03/01/2007 P:  No further oncology interventions available at this  time. She is a full code at this time.  INFECTIOUS A:   Presumed left lower lobe pneumonia in immunocompromised patient Pro-calcitonin 2.29 03/27/2017 P:   Vancomycin and Zosyn started on 03/27/2017 She will be pancultured  ENDOCRINE CBG (last 3)  No results for input(s): GLUCAP in the last 72 hours.   A:   No acute issues P:   Monitor glucose Check cortisol level for completeness  NEUROLOGIC A:   Awake and follows commands less responsive 03/27/2017 and she was a previous day. P:   RASS goal: 0 Avoid sedating medications unless intubated. CT of the head is planned for 03/27/2017.   FAMILY  - Updates: 03/27/2017  03/27/2017 the family unit was met with and CODE STATUS was discussed in length.  There is some distention amongst the group.  Although these seem to be receptive to a limited code with no chest compressions or shocks.  Further conversations will be held by Dr. required.. - Inter-disciplinary family meet or Palliative Care meeting due by:  day 7 Note palliative care consult placed on 03/27/2017 per primary care  App CCT 60 min  Richardson Landry Bernardette Waldron ACNP Maryanna Shape PCCM Pager [phone] till 1  pm If no answer page [phone] 03/27/2017, 10:34 AM

## 2017-03-27 NOTE — Progress Notes (Signed)
Amy in lab reported that the pathologist stated they didn't "think it was a reaction and that the second unit of blood could be given if needed." K. Schorr NP made aware.

## 2017-03-27 NOTE — Plan of Care (Signed)
  Interdisciplinary Goals of Care Family Meeting   Date carried out:: 03/27/2017  Location of the meeting: Bedside  Member's involved: Physician, Bedside Registered Nurse and Family Member or next of kin  Durable Power of Attorney or acting medical decision maker: daughters  Discussion: We discussed goals of care for Christina Peterson .  I explained that the CT abdomen/pelvis showed worsening lymphoma, bowel wall edema and what is likely an abscess.  I explained that these would necessitate further cancer treatment and surgery but both of those would kill her because she is so weak right now.  I advised that we should continue IV fluids gently and antibiotics.  CPR and mechanical ventilation would cause more harm than good.    Code status: Full DNR  Disposition: Continue current acute care  Time spent for the meeting: [place] 03/27/2017, 3:08 PM

## 2017-03-27 NOTE — Significant Event (Signed)
Rapid Response Event Note  Overview: Time Called: 0325 Arrival Time: 0327 Event Type: Other (Comment)  Initial Focused Assessment:  76yo female lying on bed, responds when directly spoken to, although slow to respond.  Per RN, pt began to run a temperature after transfusion eded around 10p.  MD aware, pt had received 500cc bolus x 2, tylenol with no change in fever.  VS: 112/79, 128 ST, SPO2 95%RA, 102.1 oral.  Pt with facial movement noted at forehead--almost appeared to be trying to keep her eyes open--pt daughter Altha Harm at bedside reports her mother had been doing this for the entire night.  Appeared to diminish once pt asleep.  Pt alert and oriented to person, place, and event.  Able to follow commands.  Labs ordered per MD, drawn and sent.  Temperature turned down in room, and ice packs as tolerated for comfort.     Interventions:  Labs ordered by MD, stayed in room  Plan of Care (if not transferred):  Event Summary:   at      at          [facility]

## 2017-03-27 NOTE — Consult Note (Signed)
Consultation Note Date: 03/27/2017   Patient Name: Christina Peterson  DOB: [birth year]  MRN: [id number]  Age / Sex: 76 y.o., female  PCP: Smothers, Andree Elk, NP Referring Physician: [name], MD  Reason for Consultation: Establishing goals of care  HPI/Patient Profile: 76 y.o. female  with past medical history of  admitted on 04/06/2017 with  .   Clinical Assessment and Goals of Care:  76 year old lady with B-cell lymphoma diagnosed and treated this year with CHOP, status post radiation therapy, most recent PET/CT showed ongoing disease activity in her abdomen. Patient has been admitted to hospital medicine service since 12-11 with generalized weakness. Patient was given IV fluid resuscitation and was noted to have possible pneumonia and was started on antibiotics. Patient had worsening encephalopathy and elevated lactic acid and elevated LDH which prompted a transfer to stepdown unit, further imaging with CT scan of the abdomen and pelvis showing possible abscess, bowel wall edema, worsening lymphoma.  Palliative consultation for goals of care discussions.  I joined in a family meeting with the patient, several of her loved ones, Dr. Curt Jews from pulmonary critical care service. We discussed about the patient's CT findings, we discussed about hospital course, goals wishes and values attempted to be reviewed and discussed. We discussed about the fact that the full scope of resuscitative event including CPR including intubation and mechanical ventilation would be more harmful rather than helpful at this point. We discussed about gentle appropriate medical measures such as continuing with IV fluids and IV antibiotics as the time trial while monitoring disease trajectory with clear understanding that the patient remains at high risk for decompensation/precipitous decline that would necessitate institution of  comfort measures. See recommendations below. Thank you for the consult.  NEXT OF KIN  has adult children.   SUMMARY OF RECOMMENDATIONS   Agree with and recommend DNR DNI.   Continue current medical care: IV fluids, IV antibiotics. Monitor disease trajectory, continue supportive care, discussions with patient and family about goals of care.  Along with Dr. Lake Bells, we frankly and compassionately discussed with patient and family about possibility of comfort measures/limited prognosis given the serious nature of the patient's illness.  All of the patient's and family's questions answered to the best of my ability.  Code Status/Advance Care Planning:  DNR    Symptom Management:     Palliative Prophylaxis:   Bowel Regimen Psycho-social/Spiritual:   Desire for further Chaplaincy support:no  Additional Recommendations: Caregiving  Support/Resources  Prognosis:   Unable to determine  Discharge Planning: To Be Determined      Primary Diagnoses: Present on Admission: . B-cell lymphoma ([place]) . Severe protein-calorie malnutrition ([place]) . Chest pain   I have reviewed the medical record, interviewed the patient and family, and examined the patient. The following aspects are pertinent.  Past Medical History:  Diagnosis Date  . Arthritis   . Diffuse large B-cell lymphoma of lymph nodes of multiple regions ([place]) 08/22/2016  . Hypercholesteremia   . Hypertension    FOLLOWED BY DR Katherine Roan  .  Mitral valve prolapse -  severe MR 09/20/2016   Posterior Leaflet  . Severe mitral regurgitation: Per 2 D echo 08/23/2016 08/23/2016   .posterior leaflet prolapse of mitral valve with anteriorly directed mitral regurgitation. Severe. Severely dilated left atrium.  . Severe protein-calorie malnutrition ([place])    Social History   Socioeconomic History  . Marital status: Legally Separated    Spouse name: None  . Number of children: 4  . Years of education: 4  . Highest education  level: None  Social Needs  . Financial resource strain: None  . Food insecurity - worry: None  . Food insecurity - inability: None  . Transportation needs - medical: None  . Transportation needs - non-medical: None  Occupational History  . None  Tobacco Use  . Smoking status: Never Smoker  . Smokeless tobacco: Never Used  Substance and Sexual Activity  . Alcohol use: No  . Drug use: No  . Sexual activity: None  Other Topics Concern  . None  Social History Narrative   She lives alone in an apartment.  She relies on family members for transportation & helping her figure out her medications.   She has listed married, but reportedly she is legally separated.   They have 4 children and 5 grandchildren and 5 great-grandchildren.   Has Social Security & DSS assistance with food stamps & food banks.     Family History  Problem Relation Age of Onset  . Heart disease Mother   . Hypertension Mother   . Heart attack Mother 65  . Hypertension Father   . Brain cancer Father   . Brain cancer Sister   . Hypertension Sister   . Stroke Sister   . Alcohol abuse Brother   . Depression Daughter   . Hypertension Daughter   . Thyroid disease Daughter   . Migraines Daughter    Scheduled Meds: . dexamethasone  2 mg Oral Q breakfast  . docusate sodium  100 mg Oral Q12H  . enoxaparin (LOVENOX) injection  30 mg Subcutaneous Q24H  . hyaluronate sodium  1 application Topical BID  . lactose free nutrition  237 mL Oral TID BM  . magnesium oxide  400 mg Oral BID  . methocarbamol  500 mg Oral TID  . multivitamin with minerals  1 tablet Oral Daily  . pantoprazole  20 mg Oral Daily  . sucralfate  1 g Oral QID  . tamsulosin  0.4 mg Oral QPC supper   Continuous Infusions: . dextrose 5 % and 0.9% NaCl 125 mL/hr at 03/27/17 1120  . piperacillin-tazobactam 3.375 g (03/27/17 1519)  . [START ON 03/28/2017] vancomycin     PRN Meds:.acetaminophen **OR** acetaminophen, HYDROmorphone (DILAUDID) injection,  iopamidol, loperamide, ondansetron **OR** ondansetron (ZOFRAN) IV, oxyCODONE, sodium chloride flush Medications Prior to Admission:  Prior to Admission medications   Medication Sig Start Date End Date Taking? Authorizing Provider  dexamethasone (DECADRON) 2 MG tablet Take 1 tablet (2 mg total) daily with breakfast by mouth. 02/25/17  Yes Brunetta Genera, MD  docusate sodium (COLACE) 100 MG capsule Take 1 capsule (100 mg total) by mouth every 12 (twelve) hours. 08/15/16  Yes Barton Dubois, MD  hyaluronate sodium (RADIAPLEXRX) GEL Apply 1 application topically 2 (two) times daily. Apply after rad and bedtime to affected skin area, nothing 4 hours prior rad tx   Yes [provider]  lidocaine-prilocaine (EMLA) cream Apply to port 1-2 hours before needle access. Cover with plastic wrap. 11/06/16  Yes Brunetta Genera,  MD  Loperamide HCl (IMODIUM A-D PO) Take 1-2 tablets by mouth 2 (two) times daily as needed (DIARRHEA).   Yes [provider]  magnesium oxide (MAG-OX) 400 (241.3 Mg) MG tablet Take 1 tablet (400 mg total) by mouth 2 (two) times daily. 02/10/17  Yes Brunetta Genera, MD  non-metallic deodorant Jethro Poling) MISC Apply 1 application topically daily.   Yes [provider]  ondansetron (ZOFRAN ODT) 4 MG disintegrating tablet 4mg  ODT q4 hours prn nausea/vomit 03/08/17  Yes Deno Etienne, DO  ondansetron (ZOFRAN) 8 MG tablet Take 1 tablet (8 mg total) by mouth every 8 (eight) hours as needed for nausea or vomiting. 12/23/16  Yes Brunetta Genera, MD  oxyCODONE (OXY IR/ROXICODONE) 5 MG immediate release tablet Take 1-2 tablets (5-10 mg total) by mouth every 4 (four) hours as needed for severe pain or breakthrough pain. 03/07/17  Yes Perlov, Marinell Blight, MD  pantoprazole (PROTONIX) 20 MG tablet Take 1 tablet (20 mg total) by mouth daily. 07/19/16  Yes Lacretia Leigh, MD  prochlorperazine (COMPAZINE) 10 MG tablet Take 1 tablet (10 mg total) by mouth every 6 (six) hours as  needed for nausea or vomiting. 12/23/16  Yes Brunetta Genera, MD  sucralfate (CARAFATE) 1 g tablet Take 1 tablet (1 g total) by mouth 4 (four) times daily. 07/19/16  Yes Lacretia Leigh, MD  dronabinol (MARINOL) 2.5 MG capsule Take 1 capsule (2.5 mg total) by mouth 2 (two) times daily before a meal. Patient not taking: Reported on 03/23/2017 11/11/16   Brunetta Genera, MD  potassium chloride SA (K-DUR,KLOR-CON) 20 MEQ tablet Take 1 tablet (20 mEq total) by mouth 2 (two) times daily. Patient not taking: Reported on 03/28/2017 09/12/16   Debbe Odea, MD  predniSONE (DELTASONE) 20 MG tablet Take 3 tablets (60 mg total) by mouth See admin instructions. 60 mg po daily Day 1 to Day 5 of each chemotherapy cycle Patient not taking: Reported on 03/29/2017 08/28/16   Brunetta Genera, MD  promethazine (PHENERGAN) 25 MG suppository Place 1 suppository (25 mg total) rectally every 6 (six) hours as needed for nausea or vomiting. Patient not taking: Reported on 03/28/2017 03/08/17   Deno Etienne, DO  senna-docusate (SENNA S) 8.6-50 MG tablet Take 2 tablets by mouth 2 (two) times daily. Reduce to 2tab po HS if diarrhea occurs Patient not taking: Reported on 04/03/2017 09/02/16   Brunetta Genera, MD   No Known Allergies Review of Systems + generalized weakness   Physical Exam Elderly lady, appears weak, in mild distress  diminished breath sounds Irregularly irregular.  Abdomen distended, generalized tender.  Neuro: lethargic, mumbles intermittently at times.   Vital Signs: BP 103/62   Pulse (!) 107   Temp 98.2 F (36.8 C) (Oral)   Resp (!) 28   Ht 4\' 11"  (1.499 m)   Wt 43.9 kg (96 lb 12.5 oz)   SpO2 96%   BMI 19.55 kg/m  Pain Assessment: 0-10 POSS *See Group Information*: 1-Acceptable,Awake and alert Pain Score: 8    SpO2: SpO2: 96 % O2 Device:SpO2: 96 % O2 Flow Rate: .   IO: Intake/output summary:   Intake/Output Summary (Last 24 hours) at 03/27/2017 1605 Last data filed at  03/27/2017 1300 Gross per 24 hour  Intake 910 ml  Output 675 ml  Net 235 ml    LBM: Last BM Date: 03/24/17 Baseline Weight: Weight: 34 kg (75 lb) Most recent weight: Weight: 43.9 kg (96 lb 12.5 oz)     Palliative  Assessment/Data:   PPS 30%  Time In: 1500 Time Out: 1600 Time Total: 60 min  Greater than 50%  of this time was spent counseling and coordinating care related to the above assessment and plan.  Signed by: Loistine Chance, MD  [phone] Please contact Palliative Medicine Team phone at [phone] for questions and concerns.  For individual provider: See Shea Evans

## 2017-03-27 NOTE — Progress Notes (Signed)
Shift event note: Notified at approx 2215 regarding pt w/ onset of fever of 101.1 as first unit of PRBC's about to finish. Pt resting w/o c/o of other symptoms. Tylenol given and NS IVFB given. After bolus and Tylenol temp 101.7. Initially unable to get blood cx's as PRBC's just finished. Orders placed for Tylenol 325 mg and repeat IVFB placed. Pt remains sx free. Temp after 2nd bolus and Tylenol 102.1. Pt continues to deny new symptoms and is at times noted resting in NAD. Pt has become tachycardic w/ fever (low 100's to 130's).  Assessment/Plan:  1. Persistent (new onset) fever: Refractory to intial tylenol and IVF's. No other symptoms. 2nd unit of PRBC's has been held. Low index of suspicion of transfusion reaction but will send empty unit PRBC's to blood-bank for investigation. Discussed pt w/ Dr Hal Hope. Will add LFT's, Haptoglobin and direct combs to am labs and obtain stat. Stat EKG. Blood cx's x 2. If fever continues to be refractory to tx will add empiric abx and discuss w/ oncology. Will continue to monitor closely on telemetry.   Jeryl Columbia, NP [facility] Pager [phone]

## 2017-03-27 NOTE — Progress Notes (Signed)
All paperwork and blood tubing from blood transfusion taken to blood bank by this RN.

## 2017-03-27 NOTE — Evaluation (Signed)
Occupational Therapy Evaluation Patient Details Name: Christina Peterson MRN: [id number] DOB: [birth year] Today's Date: 03/27/2017    History of Present Illness Pt is a 76 y.o. female with medical history significant of HTN, HLD, severe mitral regurgitation, non-Hodgkin's lymphoma and admitted for cancer associated generalized body pain associated with physical deconditioning, generalized weakness and severe protein calorie malnutrition:   Clinical Impression   Pt was admitted for the above.  She was sleepy at the beginning of the session but became much more alert when she sat EOB.  Pt is very weak and did not speak at all during session but did nod head.  She has had a recent decline in ADLs requiring a lot of assistance the week prior to admission. Pt will benefit from continued OT in acute and post acute.  She will either need 24/7 vs SNF.      Follow Up Recommendations  Supervision/Assistance - 24 hour;Home health OT(vs SNF)    Equipment Recommendations  (? w/c)    Recommendations for Other Services       Precautions / Restrictions Precautions Precautions: Fall Restrictions Weight Bearing Restrictions: No      Mobility Bed Mobility     Rolling: Total assist   Supine to sit: Total assist Sit to supine: Total assist   General bed mobility comments: pt 10%  Transfers                 General transfer comment: not attempted    Balance Overall balance assessment: Needs assistance     Sitting balance - Comments: min guard with use of bil UEs; min A without this support                                   ADL either performed or assessed with clinical judgement   ADL Overall ADL's : Needs assistance/impaired Eating/Feeding: Total assistance;Sitting   Grooming: Maximal assistance;Sitting   Upper Body Bathing: Total assistance;Bed level   Lower Body Bathing: Total assistance;Bed level   Upper Body Dressing : Total assistance;Bed level   Lower  Body Dressing: Total assistance;Bed level                 General ADL Comments: pt sat at eob and took one bite of food and several sips of water.  pt relied heavily on arms; light min A for balance when she attempted to use one arm. Attempted to assist with donning dentures but she was not successful.  Pt has arthritic changes in hands. Will try to provide foam for utensils; pt too weak to use it at this time     Vision         Perception     Praxis      Pertinent Vitals/Pain Pain Assessment: Faces Faces Pain Scale: Hurts little more Pain Location: (daughter reports pt has back pain at baseline) Pain Intervention(s): Limited activity within patient's tolerance;Monitored during session;Repositioned     Hand Dominance     Extremity/Trunk Assessment Upper Extremity Assessment Upper Extremity Assessment: Generalized weakness           Communication Communication Communication: No difficulties   Cognition Arousal/Alertness: Awake/alert Behavior During Therapy: WFL for tasks assessed/performed Overall Cognitive Status: Difficult to assess                                 General Comments:  very little talking; pt is very weak motorically   General Comments  HR up to 132 sitting EOB    Exercises     Shoulder Instructions      Home Living Family/patient expects to be discharged to:: Private residence Living Arrangements: Alone Available Help at Discharge: Home health Type of Home: Apartment             Bathroom Shower/Tub: Teacher, early years/pre: Standard     Home Equipment: Bedside commode;Tub bench   Additional Comments: daughter present and will assist her mother      Prior Functioning/Environment          Comments: recent decline in ADLs, approximately a week prior to admission        OT Problem List: Decreased strength;Decreased activity tolerance;Impaired balance (sitting and/or standing);Decreased knowledge of  use of DME or AE;Pain;Impaired UE functional use      OT Treatment/Interventions: Self-care/ADL training;DME and/or AE instruction;Therapeutic activities;Patient/family education;Balance training;Therapeutic exercise    OT Goals(Current goals can be found in the care plan section) Acute Rehab OT Goals Patient Stated Goal: regain strength OT Goal Formulation: With family Time For Goal Achievement: 04/10/17 Potential to Achieve Goals: Good ADL Goals Pt Will Perform Eating: with min assist;with adaptive utensils;sitting Pt Will Perform Grooming: with min assist;sitting Pt Will Perform Upper Body Bathing: with mod assist;sitting Pt Will Perform Upper Body Dressing: with mod assist;sitting Pt Will Transfer to Toilet: with mod assist;stand pivot transfer;squat pivot transfer;bedside commode Additional ADL Goal #1: pt will perform sit to stand with mod A and maintain for 2 minutes at this level for adls Additional ADL Goal #2: pt will perform 2 sets of 5 AAROM to bil UES to increase strength for adls  OT Frequency: Min 2X/week   Barriers to D/C:            Co-evaluation              AM-PAC PT "6 Clicks" Daily Activity     Outcome Measure Help from another person eating meals?: Total Help from another person taking care of personal grooming?: A Lot Help from another person toileting, which includes using toliet, bedpan, or urinal?: Total Help from another person bathing (including washing, rinsing, drying)?: Total Help from another person to put on and taking off regular upper body clothing?: Total Help from another person to put on and taking off regular lower body clothing?: Total 6 Click Score: 7   End of Session    Activity Tolerance: Patient limited by fatigue Patient left: in bed;with call bell/phone within reach;with bed alarm set;with family/visitor present;with nursing/sitter in room  OT Visit Diagnosis: Muscle weakness (generalized) (M62.81)                Time:  5449-2010 OT Time Calculation (min): 27 min Charges:  OT General Charges $OT Visit: 1 Visit OT Evaluation $OT Eval Moderate Complexity: 1 Mod OT Treatments $Therapeutic Activity: 8-22 mins G-Codes:     [place], OTR/L [phone] 03/27/2017  Christina Peterson 03/27/2017, 10:01 AM

## 2017-03-27 NOTE — Progress Notes (Signed)
CRITICAL VALUE ALERT  Critical Value:  Lactic Acid 10.1  Date & Time Notied:  03/27/17 1300  Provider Notified: McQuaid notified 03/27/2017 1300  Orders Received/Actions taken: await scheduled CT  Abdomen results

## 2017-03-27 NOTE — Evaluation (Addendum)
Clinical/Bedside Swallow Evaluation Patient Details  Name: Christina Peterson MRN: [id number] Date of Birth: [birth year]  Today's Date: 03/27/2017 Time: SLP Start Time (ACUTE ONLY): 1430 SLP Stop Time (ACUTE ONLY): 1447 SLP Time Calculation (min) (ACUTE ONLY): 17 min  Past Medical History:  Past Medical History:  Diagnosis Date  . Arthritis   . Diffuse large B-cell lymphoma of lymph nodes of multiple regions ([place]) 08/22/2016  . Hypercholesteremia   . Hypertension    FOLLOWED BY DR Katherine Roan  . Mitral valve prolapse -  severe MR 09/20/2016   Posterior Leaflet  . Severe mitral regurgitation: Per 2 D echo 08/23/2016 08/23/2016   .posterior leaflet prolapse of mitral valve with anteriorly directed mitral regurgitation. Severe. Severely dilated left atrium.  . Severe protein-calorie malnutrition ([place])    Past Surgical History:  Past Surgical History:  Procedure Laterality Date  . ABDOMINAL HYSTERECTOMY    . IR FLUORO GUIDE PORT INSERTION RIGHT  08/23/2016  . IR US GUIDE VASC ACCESS RIGHT  08/23/2016  . JOINT REPLACEMENT    . 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 TOTAL HIP REVISION  05/02/2011   Procedure: TOTAL HIP REVISION;  Surgeon: Sharmon Revere, MD;  Location: [facility];  Service: Orthopedics;  Laterality: Left;  left total hip revision with bone grafting  . TRANSTHORACIC ECHOCARDIOGRAM  08/23/2013    EF 60-65%. GR 2 DD.posterior leaflet prolapse of mitral valve with anteriorly directed mitral regurgitation. Severe. Severely dilated left atrium.   HPI:  Pt is a 76 y.o. female with PMH of diffuse large B-cell lymphoma status post chemotherapy and radiation therapy follows up with Dr.Kale,dyslipidemia, hypertension, mitral valve prolapse, severe protein calorie malnutrition, abdominal hysterectomy presented with worsening generalized weakness associated with generalized body pain.Patient lives by herself and she is gradually declining especially her functional status and her appetite.  Initial CXR 12/11 was normal; 12/13 showed Airspace consolidation left lower lobe and probable inferior lingula consistent with pneumonia. Small left pleural effusion. There is right base atelectasis. Pt had a o/p MBS on 10/25 showing possible cervical esophageal dysphagia and some oral discoordination; recommendation to continue reg diet/ thin liquids, small sips/ bites, slow rate, stay upright after meals, frequent smaller meals.  Pt had esophagram 03/04/17 which showed mild narrowing proximal esophagus; barium tablet did not pass through; mildly decreased esophageal peristalsis; small hiatal hernia. Bedside swallow eval ordered.   Assessment / Plan / Recommendation Clinical Impression  Pt showed no overt s/s of aspiration but did have multiple swallows with both thin liquid and puree consistency which was also observed on MBS in October. Pt reports ongoing dysphagia and described frequent globus sensation and some odynophagia; reported sometimes eating softer foods to compensate. Pt declined solid consistency (felt full). Pt is at an increased risk of aspiration given esophageal issues and generalized weakness. Recommend downgrading diet to dysphagia 3/ thin liquids, meds crushed in puree, full supervision to assist with feeding, reflux precautions esp. upright 30-60 minutes after meal. SLP will f/u for diet tolerance/ consider advancement. SLP Visit Diagnosis: Dysphagia, unspecified (R13.10)    Aspiration Risk  Mild aspiration risk;Moderate aspiration risk    Diet Recommendation Dysphagia 3 (Mech soft);Thin liquid   Liquid Administration via: Cup;Straw Medication Administration: Crushed with puree Supervision: Staff to assist with self feeding;Full supervision/cueing for compensatory strategies Compensations: Slow rate;Small sips/bites Postural Changes: Seated upright at 90 degrees;Remain upright for at least 30 minutes after po intake    Other  Recommendations Recommended Consults: Consider  esophageal  assessment Oral Care Recommendations: Oral care BID Other Recommendations: Clarify dietary restrictions   Follow up Recommendations Other (comment)(TBD)      Frequency and Duration min 2x/week  1 week       Prognosis Prognosis for Safe Diet Advancement: Fair Barriers to Reach Goals: (esophageal issues)      Swallow Study   General HPI: Pt is a 76 y.o. female with PMH of diffuse large B-cell lymphoma status post chemotherapy and radiation therapy follows up with Dr.Kale,dyslipidemia, hypertension, mitral valve prolapse, severe protein calorie malnutrition, abdominal hysterectomy presented with worsening generalized weakness associated with generalized body pain.Patient lives by herself and she is gradually declining especially her functional status and her appetite. Initial CXR 12/11 was normal; 12/13 showed Airspace consolidation left lower lobe and probable inferior lingula consistent with pneumonia. Small left pleural effusion. There is right base atelectasis. Pt had a o/p MBS on 10/25 showing possible cervical esophageal dysphagia and some oral discoordination; recommendation to continue reg diet/ thin liquids, small sips/ bites, slow rate, stay upright after meals, frequent smaller meals.  Pt had esophagram 03/04/17 which showed mild narrowing proximal esophagus; barium tablet did not pass through; mildly decreased esophageal peristalsis; small hiatal hernia. Bedside swallow eval ordered. Type of Study: Bedside Swallow Evaluation Previous Swallow Assessment: See HPI Diet Prior to this Study: Regular;Thin liquids Temperature Spikes Noted: Yes Respiratory Status: Nasal cannula History of Recent Intubation: No Behavior/Cognition: Alert;Cooperative Oral Cavity Assessment: Within Functional Limits Vision: Functional for self-feeding Self-Feeding Abilities: Needs assist Patient Positioning: Upright in bed Baseline Vocal Quality: Low vocal intensity Volitional Cough:  Strong Volitional Swallow: Able to elicit    Oral/Motor/Sensory Function Overall Oral Motor/Sensory Function: Within functional limits   Ice Chips Ice chips: Not tested   Thin Liquid Thin Liquid: Impaired Presentation: Straw Pharyngeal  Phase Impairments: Multiple swallows Other Comments: (Short of breath)    Nectar Thick Nectar Thick Liquid: Not tested   Honey Thick Honey Thick Liquid: Not tested   Puree Puree: Impaired Presentation: Spoon Pharyngeal Phase Impairments: Multiple swallows   Solid   GO   Solid: Not tested(refused)        Kern Reap, MA, CCC-SLP 03/27/2017,2:49 PM [phone]

## 2017-03-27 NOTE — Progress Notes (Signed)
Patients temperature increased 101.7 orally. K. Schorr NP made aware. New orders for tylenol and NS bolus received.

## 2017-03-27 NOTE — Progress Notes (Addendum)
PROGRESS NOTE  Christina Peterson  WVP:[id number] DOB: 1940/12/26 DOA: 03/31/2017 PCP: Junie Panning, NP  Brief Narrative:   Christina Peterson is a 76 y.o. 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  Patient was accompanied by her 2 daughters in ER.  Patient lives by herself and she is gradually declining especially her functional status and her appetite. She usually walks with the help of walker but recently too weak to walk.  Has nausea but no vomiting.  Denies shortness of breath, cough.  CXR negative.  UA negative.  After discussion with oncologist on 12/12, decided to proceed with blood transfusion to help with energy.  She developed tachycardia, high fevers.  Blood bank does not feel this is a transfusion reaction.  Repeat CXR demonstrates LLL pneumonia, but she is also having abdominal distension and pain.  Mentation is waxing and waning and lactic acid is 9.4 this morning.  Started on IVF and vancomycin and zosyn and transferring to stepdown/ICU.  Hospitalist at admission had discussed [place] with patient and family and they elected FULL CODE at that time.  Patient is not lucid enough at this time to confirm FULL CODE and other family members are on their way in to the hospital.  Palliative care consult has been placed and PCCM will be consulted.  High risk for decompensation/in-hospital death/need for life support measures.  Patient has a port.    Goals of care discussion: Dr. Carolin Sicks, admitting physician, had a long discussion about CODE STATUS and goals of care with the patient and her daughters at bedside at time of admission.    Assessment & Plan:  Sepsis secondary to pneumonia and possible abdominal process with tachycardia, tachypnea, fevers, and  elevated lactic acid and poor mentation.  Blood pressure so far stable and she is still making some urine. -  IVF -  F/u blood cultures -  Vancomycin and zosyn -  Trend lactic acid -  Transfer to stepdown for ongoing care.  May need ICU level of care soon -  PCCM consult -  Palliative care consultation  Nausea, vomiting and abdominal pain, alk phos rising -  Check lipase -  CT abd/pelvis  Acute metabolic encephalopathy in setting of malignancy, metabolic acidosis/lactic acidosis, sepsis -  CT head, hold on MRI for now given degree of illness  Elevated LDH, HIGH -  May have hemolysis from blood transfusion, be related to progressive cancer, tumor lysis, liver injury -  Check uric acid  Diffuse large B-cell lymphoma with associated anemia, thrombocytopenia, generalized pain, generalized weakness.   -  Continue oxycodone 34m po q3h prn parin -  Use IV Dilaudid for breakthrough pain only -  Continue Robaxin -  PT recommending SNF, but family declines -  Oncology consultation to assist with [facility]  Pt has received chemo and completed XRT on 11/16. -  PET/CT in 2 weeks -  Transfuse 1 units of packed red blood cells overnight.  Second unit canceled.   NSVT -  Check electrolytes:  Replace magnesium (only given 2gm this morning.  Add another 4gm) -  Phosphorus level pending -  Potassium rising without supplementation  Severe protein calorie malnutrition -  Appreciate nutrition assistance -  Diet liberalized, supplements ordered -  Continue dexamethasone for appetite stimulation  Possible dysphagia -  SLP evaluation pending  Mild elevation in troponin likely demand ischemia, troponins flat and inconsistent with ACS -  Echo done in October 2018 consistent with EF of 55-60%, will not repeat.  She has severe MR.  Mild hyponatremia, mild transaminase, chronic normocytic anemia likely due to malignancy, chemotherapy, mild leukocytosis noted in the lab.  Continue to monitor.   Acute  urinary retention, likely related to pain medication -  Start flomax  -  Placed foley catheter on 12/13  DVT prophylaxis:  lovenox and SCDs Code Status:  Full code Family Communication:  Patient and christine who was at bedside this morning.  Other family members arriving soon.   Disposition Plan:  Transfer to stepdown for ongoing care.    Consultants:   Spoke with Dr. Irene Limbo by phone, Oncology  Procedures:  none  Antimicrobials:  Anti-infectives (From admission, onward)   Start     Dose/Rate Route Frequency Ordered Stop   03/28/17 0900  vancomycin (VANCOCIN) 500 mg in sodium chloride 0.9 % 100 mL IVPB     500 mg 100 mL/hr over 60 Minutes Intravenous Every 24 hours 03/27/17 0957     03/27/17 0800  piperacillin-tazobactam (ZOSYN) IVPB 3.375 g     3.375 g 12.5 mL/hr over 240 Minutes Intravenous Every 8 hours 03/27/17 0729     03/27/17 0730  vancomycin (VANCOCIN) IVPB 1000 mg/200 mL premix     1,000 mg 200 mL/hr over 60 Minutes Intravenous  Once 03/27/17 0729 03/27/17 0920       Subjective: Unintelligible at times.  Slow to answer questions or respond to commands.  Having some abdominal pain or discomfort and nausea.  Denies difficulty breathing.    Objective: Vitals:   03/27/17 0526 03/27/17 0545 03/27/17 0616 03/27/17 0921  BP: (!) 105/57  103/64 111/65  Pulse: (!) 128 (!) 127 (!) 123 (!) 113  Resp:   16 (!) 24  Temp: 99.3 F (37.4 C)  99.2 F (37.3 C) 98.5 F (36.9 C)  TempSrc: Oral  Oral Oral  SpO2:   98% 96%  Weight:      Height:        Intake/Output Summary (Last 24 hours) at 03/27/2017 1002 Last data filed at 03/27/2017 0600 Gross per 24 hour  Intake 860 ml  Output 1075 ml  Net -215 ml   Filed Weights   03/28/2017 1436 03/26/17 0252  Weight: 34 kg (75 lb) 43.9 kg (96 lb 12.5 oz)    Examination:  General exam:  Adult female.  Ill-appearing, mentation waxing/waning.  No facial droop.  Mild tachypnea.  Barely intelligible voice HEENT:  NCAT,  MMM Respiratory system: Bilateral rales at the bases, worse on the left base than right.  No rhonchi or wheeze.   Cardiovascular system: tachycardic, normal S1/S2. No murmurs, rubs, gallops or clicks.  Warm extremities Gastrointestinal system: HYpoactive bowel sounds, moderately distended, TTP diffusely without rebound or guarding.   MSK:  Normal tone and bulk, 2+ pitting bilateral lower extremity edema Neuro:  No facial droop.  Unable to lift left arm or legs from the bed.  Barely able to lift the right arm from the bed and only with extreme effort.     Data Reviewed: I have personally reviewed following labs and imaging studies  CBC: Recent Labs  Lab 04/12/2017 1513 03/26/17 0256 03/27/17 0348  WBC 10.9* 10.3 14.3*  NEUTROABS 8.0*  --   --   HGB 8.0* 8.1* 10.3*  HCT 24.9* 25.0* 32.1*  MCV 83.3 83.6 84.3  PLT 79* 77*  67*   Basic Metabolic Panel: Recent Labs  Lab 03/18/2017 1513 03/26/17 0256 03/27/17 0348  NA 134* 134* 136  K 4.0 4.6 5.0  CL 96* 101 103  CO2 25 22 19*  GLUCOSE 89 87 105*  BUN 21* 17 15  CREATININE 0.57 0.58 0.64  CALCIUM 9.7 9.4 9.7  MG  --   --  1.0*   GFR: Estimated Creatinine Clearance: 40.2 mL/min (by C-G formula based on SCr of 0.64 mg/dL). Liver Function Tests: Recent Labs  Lab 03/22/2017 1513 03/26/17 0256 03/27/17 0348  AST 77* 73* 109*  ALT 30 30 38  ALKPHOS 120 124 217*  BILITOT 0.8 1.0 0.8  PROT 4.5* 4.1* 4.5*  ALBUMIN 2.3* 2.1* 2.2*   No results for input(s): LIPASE, AMYLASE in the last 168 hours. No results for input(s): AMMONIA in the last 168 hours. Coagulation Profile: Recent Labs  Lab 03/27/17 0810  INR 1.04   Cardiac Enzymes: Recent Labs  Lab 03/18/2017 1513 04/12/2017 2041 03/26/17 0256 03/26/17 1041  TROPONINI 0.05* 0.06* 0.05* 0.04*   BNP (last 3 results) No results for input(s): PROBNP in the last 8760 hours. HbA1C: No results for input(s): HGBA1C in the last 72 hours. CBG: No results for input(s): GLUCAP in the  last 168 hours. Lipid Profile: No results for input(s): CHOL, HDL, LDLCALC, TRIG, CHOLHDL, LDLDIRECT in the last 72 hours. Thyroid Function Tests: No results for input(s): TSH, T4TOTAL, FREET4, T3FREE, THYROIDAB in the last 72 hours. Anemia Panel: No results for input(s): VITAMINB12, FOLATE, FERRITIN, TIBC, IRON, RETICCTPCT in the last 72 hours. Urine analysis:    Component Value Date/Time   COLORURINE YELLOW 03/27/2017 [place] 03/27/2017 0347   LABSPEC 1.020 03/27/2017 0347   PHURINE 5.0 03/27/2017 0347   GLUCOSEU NEGATIVE 03/27/2017 0347   HGBUR MODERATE (A) 03/27/2017 0347   BILIRUBINUR NEGATIVE 03/27/2017 0347   KETONESUR NEGATIVE 03/27/2017 0347   PROTEINUR NEGATIVE 03/27/2017 0347   UROBILINOGEN 0.2 04/30/2011 1145   NITRITE NEGATIVE 03/27/2017 0347   LEUKOCYTESUR SMALL (A) 03/27/2017 0347   Sepsis Labs: @LABRCNTIP (procalcitonin:4,lacticidven:4)  )No results found for this or any previous visit (from the past 240 hour(s)).    Radiology Studies: Dg Chest 2 View  Result Date: 03/31/2017 CLINICAL DATA:  Weakness for 3 days.  Bilateral hip pain. EXAM: CHEST  2 VIEW COMPARISON:  PA and lateral chest 12/19/2017. FINDINGS: Port-A-Cath remains in place. There is cardiomegaly without edema. No consolidative process, pneumothorax or effusion. No acute bony abnormality. IMPRESSION: Cardiomegaly without acute disease. Electronically Signed   By: Inge Rise M.D.   On: 03/20/2017 17:56   Dg Chest Port 1 View  Result Date: 03/27/2017 CLINICAL DATA:  Fever.  Large B-cell lymphoma EXAM: PORTABLE CHEST 1 VIEW COMPARISON:  March 25, 2017 FINDINGS: Port-A-Cath tip at cavoatrial junction. Huber needle is position within the port. No pneumothorax. There is airspace consolidation in the left lower lobe and probable inferior lingula with left pleural effusion. There is atelectatic change in the right base. Heart is mildly enlarged with pulmonary vascularity within  normal limits. No adenopathy. There is degenerative change in each shoulder. IMPRESSION: Port-A-Cath as described without pneumothorax. Airspace consolidation left lower lobe and probable inferior lingula consistent with pneumonia. Small left pleural effusion. There is right base atelectasis. Cardiac silhouette is stable. Electronically Signed   By: Lowella Grip III M.D.   On: 03/27/2017 09:29   Dg Hips Bilat W Or Wo Pelvis 2 Views  Result Date: 03/21/2017 CLINICAL DATA:  Weakness bilateral hip pain for 3 days. No known injury. EXAM: DG HIP (WITH OR WITHOUT PELVIS) 2V BILAT COMPARISON:  None. FINDINGS: There is no acute bony or joint abnormality. 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 Moderate to moderately severe degenerative disease about the right hip is seen. The patient has bilateral sacroiliitis which appears remote and worse on the left. Soft tissues are unremarkable. IMPRESSION: No acute abnormality. 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 Electronically Signed   By: Inge Rise M.D.   On: 04/02/2017 17:57     Scheduled Meds: . dexamethasone  2 mg Oral Q breakfast  . docusate sodium  100 mg Oral Q12H  . enoxaparin (LOVENOX) injection  30 mg Subcutaneous Q24H  . hyaluronate sodium  1 application Topical BID  . lactose free nutrition  237 mL Oral TID BM  . magnesium oxide  400 mg Oral BID  . methocarbamol  500 mg Oral TID  . multivitamin with minerals  1 tablet Oral Daily  . pantoprazole  20 mg Oral Daily  . sucralfate  1 g Oral QID  . tamsulosin  0.4 mg Oral QPC supper   Continuous Infusions: . dextrose 5 % and 0.9 % NaCl with KCl 20 mEq/L    . piperacillin-tazobactam 3.375 g (03/27/17 0917)  . sodium chloride 500 mL (03/27/17 0947)  . [START ON 03/28/2017] vancomycin       LOS: 1 day    Time spent: 30 min    [name], MD [facility] Pager [phone]  If 7PM-7AM, please contact  night-coverage [email or web address] Password [email or web address] 03/27/2017, 10:02 AM

## 2017-03-27 NOTE — Progress Notes (Addendum)
Patient HR still 130's, still running a temp. Rapid response RN called to assess patient. Pt is also having facial twitching, which daughter Altha Harm states she has been doing this the whole night. Ice packs applied to cool pt down, room temp turned down. BP stable. Oriented and follows commands. Pupils round and reactive to light.  Labs ordered, drawn, and sent. Per Lamar Blinks, NP, complete transfusion reaction protocol.

## 2017-03-28 ENCOUNTER — Inpatient Hospital Stay (HOSPITAL_COMMUNITY): Payer: Medicare Other

## 2017-03-28 DIAGNOSIS — C8330 Diffuse large B-cell lymphoma, unspecified site: Secondary | ICD-10-CM

## 2017-03-28 LAB — TRANSFUSION REACTION
DAT C3: NEGATIVE
POST RXN DAT IGG: NEGATIVE

## 2017-03-28 LAB — CBC
HCT: 30.8 % — ABNORMAL LOW (ref 36.0–46.0)
Hemoglobin: 9.7 g/dL — ABNORMAL LOW (ref 12.0–15.0)
MCH: 26.9 pg (ref 26.0–34.0)
MCHC: 31.5 g/dL (ref 30.0–36.0)
MCV: 85.3 fL (ref 78.0–100.0)
PLATELETS: 60 10*3/uL — AB (ref 150–400)
RBC: 3.61 MIL/uL — AB (ref 3.87–5.11)
RDW: 21.4 % — ABNORMAL HIGH (ref 11.5–15.5)
WBC: 10.3 10*3/uL (ref 4.0–10.5)

## 2017-03-28 LAB — COMPREHENSIVE METABOLIC PANEL
ALT: 37 U/L (ref 14–54)
AST: 109 U/L — AB (ref 15–41)
Albumin: 1.8 g/dL — ABNORMAL LOW (ref 3.5–5.0)
Alkaline Phosphatase: 193 U/L — ABNORMAL HIGH (ref 38–126)
Anion gap: 15 (ref 5–15)
BUN: 13 mg/dL (ref 6–20)
CHLORIDE: 106 mmol/L (ref 101–111)
CO2: 15 mmol/L — AB (ref 22–32)
CREATININE: 0.68 mg/dL (ref 0.44–1.00)
Calcium: 9.1 mg/dL (ref 8.9–10.3)
GFR calc non Af Amer: >60 mL/min (ref >60)
Glucose, Bld: 84 mg/dL (ref 65–99)
POTASSIUM: 4.6 mmol/L (ref 3.5–5.1)
SODIUM: 136 mmol/L (ref 135–145)
Total Bilirubin: 0.8 mg/dL (ref 0.3–1.2)
Total Protein: 4.1 g/dL — ABNORMAL LOW (ref 6.5–8.1)

## 2017-03-28 LAB — PATHOLOGIST SMEAR REVIEW

## 2017-03-28 LAB — MAGNESIUM: Magnesium: 1.7 mg/dL (ref 1.7–2.4)

## 2017-03-28 LAB — HAPTOGLOBIN: Haptoglobin: <10 mg/dL — ABNORMAL LOW (ref 34–200)

## 2017-03-28 LAB — PHOSPHORUS: PHOSPHORUS: 2.7 mg/dL (ref 2.5–4.6)

## 2017-03-28 MED ORDER — HYDROMORPHONE HCL 1 MG/ML IJ SOLN
1.0000 mg | INTRAMUSCULAR | Status: DC | PRN
  Administered 2017-03-28 – 2017-03-31 (×16): 1 mg via INTRAVENOUS
  Filled 2017-03-28 (×16): qty 1

## 2017-03-28 MED ORDER — CHLORHEXIDINE GLUCONATE CLOTH 2 % EX PADS
6.0000 | MEDICATED_PAD | Freq: Every day | CUTANEOUS | Status: DC
  Administered 2017-03-28 – 2017-03-31 (×2): 6 via TOPICAL

## 2017-03-28 NOTE — Progress Notes (Signed)
Date:  March 28, 2017 Chart reviewed for concurrent status and case management needs.  Will continue to follow patient progress.  Discharge Planning: following for needs  Expected discharge date: March 31, 2017  [name], BSN, [place], [place]

## 2017-03-28 NOTE — Progress Notes (Signed)
Christina Peterson   HEMATOLOGY/ONCOLOGY INPATIENT PROGRESS NOTE  Date of Service: 03/27/2017  Inpatient Attending: .[name], MD  SUBJECTIVE  Patient was seen in the stepdown unit today with her daughter at bedside.  She had worsening of her clinical status.  Her LDH level is significantly elevated at 2500 and her CT abd/pelvis showed extensive progression of her lymphoma including significant liver involvement progressive lymphadenopathy and worsening left-sided hydronephrosis as well as new ascites.  She was also noted to have a large loculated fluid effusion which could possibly be malignant ascites versus an abscess.  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 She was also noted to have a likely pneumonia and has been started on broad-spectrum antibiotics. She has elevated lactate levels and signs of severe sepsis. I spent significant time with the patient and her daughter at bedside as well as in talking with Dr. Sheran Fava.  We discussed that given the patient's progressive lymphoma and life-threatening sepsis that her prognosis overall quite grave.  She would not be a candidate for any lymphoma directed therapies at this time given her poor performance status and significantly depleted nutritional status.  We discussed that it is quite possible that she may not make it through this episode of decompensation. Patient's daughter understands this and the patient has been made DNR at this time. We discussed that if the patient does improve on antibiotics enough to leave the hospital that we would strongly recommend hospice cares.  OBJECTIVE:   Patient appears lethargic and cachectic  PHYSICAL EXAMINATION: . Vitals:   03/28/17 1000 03/28/17 1100 03/28/17 1200 03/28/17 1400  BP:   (!) 89/57   Pulse: (!) 145 (!) 158  (!) 127  Resp: 16 17  (!) 21  Temp:   98.8 F (37.1 C)   TempSrc:   Axillary   SpO2: 93% 95%  96%  Weight:      Height:       Filed Weights   04/11/2017 1436 03/26/17  0252  Weight: 75 lb (34 kg) 96 lb 12.5 oz (43.9 kg)   .Body mass index is 19.55 kg/m.  GENERAL:alert, appears lethargic cachectic and tends to not want to talk. SKIN: No acute rashes EYES: Conjunctival pallor noted  OROPHARYNX:no exudate, no overt thrush. NECK: supple LYMPH:  no palpable lymphadenopathy in the cervical, axillary or inguinal LUNGS: clear to auscultation with normal respiratory effort HEART: regular rate & rhythm,  no murmurs and no lower extremity edema ABDOMEN: abdomen mildly distended , nonfocal mild tenderness to palpation , normoactive bowel sounds  musculoskeletal: No pedal edema NEURO: no focal motor/sensory deficits  MEDICAL HISTORY:  Past Medical History:  Diagnosis Date  . Arthritis   . Diffuse large B-cell lymphoma of lymph nodes of multiple regions ([place]) 08/22/2016  . Hypercholesteremia   . Hypertension    FOLLOWED BY DR Katherine Roan  . Mitral valve prolapse -  severe MR 09/20/2016   Posterior Leaflet  . Severe mitral regurgitation: Per 2 D echo 08/23/2016 08/23/2016   .posterior leaflet prolapse of mitral valve with anteriorly directed mitral regurgitation. Severe. Severely dilated left atrium.  . Severe protein-calorie malnutrition ([place])     SURGICAL HISTORY: Past Surgical History:  Procedure Laterality Date  . ABDOMINAL HYSTERECTOMY    . IR FLUORO GUIDE PORT INSERTION RIGHT  08/23/2016  . IR US GUIDE VASC ACCESS RIGHT  08/23/2016  . JOINT REPLACEMENT    . 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 TOTAL HIP REVISION  05/02/2011  Procedure: TOTAL HIP REVISION;  Surgeon: Sharmon Revere, MD;  Location: [facility];  Service: Orthopedics;  Laterality: Left;  left total hip revision with bone grafting  . TRANSTHORACIC ECHOCARDIOGRAM  08/23/2013    EF 60-65%. GR 2 DD.posterior leaflet prolapse of mitral valve with anteriorly directed mitral regurgitation. Severe. Severely dilated left atrium.    SOCIAL HISTORY: Social History   Socioeconomic History  .  Marital status: Legally Separated    Spouse name: Not on file  . Number of children: 4  . Years of education: 50  . Highest education level: Not on file  Social Needs  . Financial resource strain: Not on file  . Food insecurity - worry: Not on file  . Food insecurity - inability: Not on file  . Transportation needs - medical: Not on file  . Transportation needs - non-medical: Not on file  Occupational History  . Not on file  Tobacco Use  . Smoking status: Never Smoker  . Smokeless tobacco: Never Used  Substance and Sexual Activity  . Alcohol use: No  . Drug use: No  . Sexual activity: Not on file  Other Topics Concern  . Not on file  Social History Narrative   She lives alone in an apartment.  She relies on family members for transportation & helping her figure out her medications.   She has listed married, but reportedly she is legally separated.   They have 4 children and 5 grandchildren and 5 great-grandchildren.   Has Social Security & DSS assistance with food stamps & food banks.      FAMILY HISTORY: Family History  Problem Relation Age of Onset  . Heart disease Mother   . Hypertension Mother   . Heart attack Mother 32  . Hypertension Father   . Brain cancer Father   . Brain cancer Sister   . Hypertension Sister   . Stroke Sister   . Alcohol abuse Brother   . Depression Daughter   . Hypertension Daughter   . Thyroid disease Daughter   . Migraines Daughter     ALLERGIES:  has No Known Allergies.  MEDICATIONS:  Scheduled Meds: . Chlorhexidine Gluconate Cloth  6 each Topical Daily  . enoxaparin (LOVENOX) injection  30 mg Subcutaneous Q24H  . hyaluronate sodium  1 application Topical BID   Continuous Infusions: . dextrose 5 % and 0.9% NaCl 125 mL/hr at 03/28/17 0800  . piperacillin-tazobactam Stopped (03/28/17 1200)  . vancomycin Stopped (03/28/17 1233)   PRN Meds:.acetaminophen **OR** acetaminophen, HYDROmorphone (DILAUDID) injection, iopamidol,  ondansetron **OR** ondansetron (ZOFRAN) IV, sodium chloride flush  REVIEW OF SYSTEMS:    10 Point review of Systems was done is negative except as noted above.   LABORATORY DATA:  I have reviewed the data as listed  . CBC Latest Ref Rng & Units 03/28/2017 03/27/2017 03/26/2017  WBC 4.0 - 10.5 K/uL 10.3 14.3(H) 10.3  Hemoglobin 12.0 - 15.0 g/dL 9.7(L) 10.3(L) 8.1(L)  Hematocrit 36.0 - 46.0 % 30.8(L) 32.1(L) 25.0(L)  Platelets 150 - 400 K/uL 60(L) 67(L) 77(L)    . CMP Latest Ref Rng & Units 03/28/2017 03/27/2017 03/26/2017  Glucose 65 - 99 mg/dL 84 105(H) 87  BUN 6 - 20 mg/dL 13 15 17   Creatinine 0.44 - 1.00 mg/dL 0.68 0.64 0.58  Sodium 135 - 145 mmol/L 136 136 134(L)  Potassium 3.5 - 5.1 mmol/L 4.6 5.0 4.6  Chloride 101 - 111 mmol/L 106 103 101  CO2 22 - 32 mmol/L 15(L) 19(L) 22  Calcium 8.9 - 10.3 mg/dL 9.1 9.7 9.4  Total Protein 6.5 - 8.1 g/dL 4.1(L) 4.5(L) 4.1(L)  Total Bilirubin 0.3 - 1.2 mg/dL 0.8 0.8 1.0  Alkaline Phos 38 - 126 U/L 193(H) 217(H) 124  AST 15 - 41 U/L 109(H) 109(H) 73(H)  ALT 14 - 54 U/L 37 38 30   Component     Latest Ref Rng & Units 03/27/2017 03/27/2017 03/27/2017         8:10 AM 12:00 PM 12:10 PM  Prothrombin Time     11.4 - 15.2 seconds 13.5    INR      1.04    Lactic Acid, Venous     0.5 - 1.9 mmol/L 9.4 (HH)  10.1 (HH)  Procalcitonin     ng/mL 2.29    APTT     24 - 36 seconds 45 (H)    Phosphorus     2.5 - 4.6 mg/dL   2.3 (L)  Uric Acid, Serum     2.3 - 6.6 mg/dL   7.5 (H)  Lipase     11 - 51 U/L   11  Cortisol, Plasma     ug/dL  21.7     RADIOGRAPHIC STUDIES: I have personally reviewed the radiological images as listed and agreed with the findings in the report. Dg Chest 2 View  Result Date: 04/08/2017 CLINICAL DATA:  Weakness for 3 days.  Bilateral hip pain. EXAM: CHEST  2 VIEW COMPARISON:  PA and lateral chest 12/19/2017. FINDINGS: Port-A-Cath remains in place. There is cardiomegaly without edema. No consolidative process,  pneumothorax or effusion. No acute bony abnormality. IMPRESSION: Cardiomegaly without acute disease. Electronically Signed   By: Inge Rise M.D.   On: 03/22/2017 17:56   Ct Head Wo Contrast  Result Date: 03/27/2017 CLINICAL DATA:  76 year old female with diffuse large B-cell lymphoma status post chemotherapy and radiation. Altered level of consciousness. EXAM: CT HEAD WITHOUT CONTRAST TECHNIQUE: Contiguous axial images were obtained from the base of the skull through the vertex without intravenous contrast. COMPARISON:  PET-CT 01/15/2017.  Brain MRI 08/22/2016. FINDINGS: Brain: Stable cerebral volume. No midline shift, ventriculomegaly, mass effect, evidence of mass lesion, intracranial hemorrhage or evidence of cortically based acute infarction. Gray-white matter differentiation is within normal limits throughout the brain. Vascular: Mild Calcified atherosclerosis at the skull base. No suspicious intracranial vascular hyperdensity. Skull: Stable bone mineralization. No acute or suspicious osseous lesion identified. Sinuses/Orbits: Chronic hyperostosis of the skull base and visible paranasal sinuses. Tympanic cavities and mastoids remain clear. Other: Negative visible orbit and scalp soft tissues. IMPRESSION: No acute intracranial abnormality. Negative for age noncontrast CT appearance of the brain. Electronically Signed   By: Genevie Ann M.D.   On: 03/27/2017 14:08   Ct Abdomen Pelvis W Contrast  Result Date: 03/27/2017 CLINICAL DATA:  Diffuse large B-cell lymphoma. Abdominal pain. Fever and abscesses. EXAM: CT ABDOMEN AND PELVIS WITH CONTRAST TECHNIQUE: Multidetector CT imaging of the abdomen and pelvis was performed using the standard protocol following bolus administration of intravenous contrast. CONTRAST:  100 cc of Isovue-300 COMPARISON:  01/15/2017 FINDINGS: Lower chest: There are moderate bilateral pleural effusions with overlying compressive type atelectasis. Hepatobiliary: There are numerous  low-attenuation lesions identified throughout both lobes of liver. This is a new finding when compared with previous examination. Index lesion within anterior right lobe of liver 1.1 cm, image 15 of series 2. Index lesion within the lateral segment of left lobe of liver measures 1.4 cm, image 18 of series 2. Posterior  right lobe of liver lesion measures 1 cm, image 27 of series 2. Gallbladder normal. No biliary dilatation. Pancreas: Unremarkable. No pancreatic ductal dilatation or surrounding inflammatory changes. Spleen: Small low density structure within the spleen measures 7 mm, image 23 of series 2. New from previous exam. Adrenals/Urinary Tract: Normal appearance of the right adrenal gland. New left adrenal nodule/mass measures 2.9 x 2.8 cm, image number 28 of series 2. Nodule in the right adrenal gland measures 1.7 cm, image 26 of series 2. Complex mass within the inferior pole of the right kidney is again identified measuring 4.3 x 3.8 cm, image 43 of series 2. Similar to previous exam. There is progressive left-sided hydronephrosis. There is a new soft tissue mass within the left periaortic retroperitoneum extending into the left renal hilum measuring 3.1 x 3.8 x 6.0 cm. This encases both the left renal vein and left renal artery. Bladder collapsed around a Foley catheter. Stomach/Bowel: Small hiatal hernia. No pathologic dilatation of the small bowel loops. No abnormal dilatation of the colon. Vascular/Lymphatic: Aortic atherosclerosis. No aneurysm. Extensive adenopathy within the abdomen is identified. Retrocaval lymph node at the level of the right renal hilum measures 4.1 x 2.5 cm, image 37 of series 2. Previously 3.8 x 2.0 cm. Large necrotic lymph node within the portacaval region is new measuring 2.4 x 3.3 cm, image 29 of series 2. Nodal mass within the lower mesenteric measures 6.4 x 4.3 cm, image 46 of series 2. Previously 5.6 x 3.4 cm. Reproductive: Uterus and adnexal structures are not visualized  and may be surgically absent. Other: There is new abdominal and pelvic ascites. Loculated fluid within the posterior pelvis is new measuring 5.3 x 8.3 x 8.9 cm. Diffuse body wall edema is identified compatible with anasarca. Musculoskeletal: Previous left hip arthroplasty. No aggressive lytic or sclerotic bone lesions identified. IMPRESSION: 1. Overall there has been interval progression of disease. 2. Interval development of multiple low-attenuation lesions throughout the liver compatible with metastatic disease. 3. Progressive periaortic adenopathy with involvement of the left renal hilum and worsening left-sided hydronephrosis. 4. Increase in a aortocaval and portacaval adenopathy. 5. New ascites and diffuse body wall edema. 6. Large loculated fluid collection within the posterior pelvis may represent abscess or loculated malignant ascites. 7. Bilateral adrenal gland lesions, new from previous exam. 8. Similar appearance of right kidney lower pole mass which is favored to represent a renal cell carcinoma. 9. Progressive bilateral pleural effusions. Electronically Signed   By: Kerby Moors M.D.   On: 03/27/2017 14:31   Dg Esophagus  Result Date: 03/04/2017 CLINICAL DATA:  Dysphagia.  Lymphoma. EXAM: ESOPHOGRAM / BARIUM SWALLOW / BARIUM TABLET STUDY TECHNIQUE: Combined double contrast and single contrast examination performed using effervescent crystals, thick barium liquid, and thin barium liquid. The patient was observed with fluoroscopy swallowing a 13 mm barium sulphate tablet. FLUOROSCOPY TIME:  Fluoroscopy Time:  1 minutes 54 second Radiation Exposure Index (if provided by the fluoroscopic device): Number of Acquired Spot Images: 0 COMPARISON:  None. FINDINGS: Mild narrowing of the proximal esophagus at C5-6. Smooth mucosal surface. Narrowing was not appreciated on initial swallowing however the barium tablet did not pass this area despite multiple swallows of water and barium. No diverticulum or mass  is seen in this area. Mid and distal esophagus mildly dilated with decreased peristalsis. Small hiatal hernia without reflux. IMPRESSION: Mild narrowing proximal esophagus at C5-6. Barium tablet did not pass this area. Mucosa is smooth in this area which appears benign Mild decreased  esophageal peristalsis.  Small hiatal hernia Electronically Signed   By: Franchot Gallo M.D.   On: 03/04/2017 11:06   Dg Chest Port 1 View  Result Date: 03/28/2017 CLINICAL DATA:  Pneumonia. EXAM: PORTABLE CHEST 1 VIEW COMPARISON:  03/27/2017. FINDINGS: Port-A-Cath in stable position. Stable cardiomegaly. Progressive diffuse left lung infiltrate. Progressive small left pleural effusion. Persistent mild right base atelectasis. No pneumothorax . IMPRESSION: 1. Port-A-Cath in stable position. 2. Progressive diffuse left lung infiltrate most consistent pneumonia. Progressive small left pleural effusion. Mild right base atelectasis again noted. 3. Stable cardiomegaly . Electronically Signed   By: Marcello Moores  Register   On: 03/28/2017 06:42   Dg Chest Port 1 View  Result Date: 03/27/2017 CLINICAL DATA:  Fever.  Large B-cell lymphoma EXAM: PORTABLE CHEST 1 VIEW COMPARISON:  March 25, 2017 FINDINGS: Port-A-Cath tip at cavoatrial junction. Huber needle is position within the port. No pneumothorax. There is airspace consolidation in the left lower lobe and probable inferior lingula with left pleural effusion. There is atelectatic change in the right base. Heart is mildly enlarged with pulmonary vascularity within normal limits. No adenopathy. There is degenerative change in each shoulder. IMPRESSION: Port-A-Cath as described without pneumothorax. Airspace consolidation left lower lobe and probable inferior lingula consistent with pneumonia. Small left pleural effusion. There is right base atelectasis. Cardiac silhouette is stable. Electronically Signed   By: Lowella Grip III M.D.   On: 03/27/2017 09:29   Dg Hips Bilat W Or Wo  Pelvis 2 Views  Result Date: 03/24/2017 CLINICAL DATA:  Weakness bilateral hip pain for 3 days. No known injury. EXAM: DG HIP (WITH OR WITHOUT PELVIS) 2V BILAT COMPARISON:  None. FINDINGS: There is no acute bony or joint abnormality. 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 Moderate to moderately severe degenerative disease about the right hip is seen. The patient has bilateral sacroiliitis which appears remote and worse on the left. Soft tissues are unremarkable. IMPRESSION: No acute abnormality. 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 Electronically Signed   By: Inge Rise M.D.   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 Patient presented with extensive retroperitoneal mesenteric and periportal lymphadenopathy along with adenopathy in the chest and supraclavicular areas and a focal lesion in the spleen.   Patient received first cycle of R-mini CHOP on 08/26/2016 and received outpatient neulasta. Patient received 2nd  cycle of R-mini CHOP on 09/26/2016 and received outpatient neulasta. Received third cycle of R-mini CHOP on 09/26/2016 and received outpatient neulasta. Received third cycle of R-mini CHOP on 10/21/2016 and received outpatient neulasta. PET scan on 11/04/2016 which shows treatment response but still with fairly active disease. C4 -anthracycline dose (doxorubicin 50 mg/m) to standard CHOP dosing and continue with dose reduced cyclophosphamide. C5 and C6 of R-CHOP with some dose as cycle 4. PET CT after 6 cycles of R-mini CHOP with Partial response. Patient has completed her palliative radiation therapy on 02/28/2017.  CT abdomen pelvis on 03/27/2017 show significant lymphoma progression in the abdomen. . Lab Results  Component Value Date   LDH 2,646 (H) 03/27/2017     #2 severe protein calorie malnutrition  and weight loss . Wt Readings from Last 3 Encounters:  03/26/17 96 lb 12.5 oz (43.9 kg)  03/08/17 84 lb (38.1 kg)  03/05/17 86 lb 9.6 oz (39.3 kg)   #3  worsening anemia and thrombocytopenia -due to progressive lymphoma and sepsis. #4 Right lower pole of the kidney lesion ? Renal cell carcinoma   #5 significant fatigue and failure to thrive due to poor oral intake related DLBCL progression  #6 Pneumonia ? Aspiration vs HCAP With sepsis Plan -Patient is currently ECOG performance status of 3-4 -Her imaging studies and LDH clearly suggest significantly progressive aggressive diffuse large B-cell lymphoma which is also contributing to progressive cytopenias. -She also appears to have florid sepsis related to likely pneumonia versus abdominal abscess. -He is on antibiotics per hospitalist. -I discussed goals of care in detail with the patient and her family at bedside.  Grave prognosis in the setting of significant progression of her diffuse large B-cell lymphoma and significant sepsis.  It is likely that she may not stabilize enough to leave the hospital. -Would have low threshold to transition completely to comfort cares. -If her infection were to stabilize with antibiotics -would recommend discharge planning with hospice cares -would likely need introducing hospice in that setting. -Not a candidate for any further treatment considerations for diffuse large B-cell lymphoma. -appreciate palliative care input.  #6 severe mitral regurgitation normal ejection fraction Will need to be mindful with hydration and monitor fluid status  DNAR  We shall follow along as needed.  I spent 30 minutes counseling the patient face to face. The total time spent in the appointment was 35 minutes and more than 50% was on counseling and direct patient cares.    Sullivan Lone MD [place] AAHIVMS [facility] Hematology/Oncology Physician [facility]  (Office):       [phone] (Work cell):   [phone] (Fax):           [phone]  03/28/2017 3:18 PM

## 2017-03-28 NOTE — Progress Notes (Signed)
PT Cancellation Note  Patient Details Name: Christina Peterson MRN: [id number] DOB: [birth year]   Cancelled Treatment:    Reason Eval/Treat Not Completed: Medical issues which prohibited therapy-Low BP this am. Also note possible transition to comfort measures. Will hold PT for now and check back another time/day. Thanks.    Weston Anna, MPT Pager: [phone]

## 2017-03-28 NOTE — Progress Notes (Signed)
SLP Cancellation Note  Patient Details Name: Christina Peterson MRN: [id number] DOB: 1940-07-01   Cancelled treatment:       Reason Eval/Treat Not Completed: Other (comment);Fatigue/lethargy limiting ability to participate  Note per chart review, pt being seen by palliative, possible plans for comfort measures only.    [name] [place], [facility] SLP [phone]

## 2017-03-28 NOTE — Progress Notes (Addendum)
PROGRESS NOTE  Christina Peterson  DQQ:[id number] DOB: 1940-11-04 DOA: 04/04/2017 PCP: Junie Panning, NP  Brief Narrative:   Christina Peterson is a 76 y.o. 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  Patient was accompanied by her 2 daughters in ER.  Patient lives by herself and she is gradually declining especially her functional status and her appetite. She usually walks with the help of walker but recently too weak to walk.  Has nausea but no vomiting.  Denies shortness of breath, cough.  CXR negative.  UA negative.  After discussion with oncologist on 12/12, decided to proceed with blood transfusion to help with energy.  She developed tachycardia, high fevers.  Blood bank did not feel this was a transfusion reaction.  Repeat CXR demonstrates LLL pneumonia, but she was also having abdominal distension and pain.  Mentation was waxing and waning and lactic acid was 9.4.  She was started on IV fluids and antibiotics and transferred to the stepdown unit.  Critical care was consulted.  She underwent a head CT which was negative for any acute change.  Her abdominal CT showed rapidly progressive cancer with large intra-abdominal lymphadenopathy and masses, some of which were eroding into her left kidney and causing left hydronephrosis.  She had innumerable liver mets.  She had a conversation with critical care and palliative medicine on 12/13 and was transition to DNR.  The family had elected to continue IV fluids and antibiotics as a trial to see if she could recover some strength.  Despite these interventions overnight, she has become hypotensive and tachycardic.  She has not been receiving sedating medications however she is very somnolent this morning.  I spoke  with her family at length and they are agreeable to meet with hospice to discuss residential hospice options.  Additionally, they understand that if she continues to decline that we should stop her antibiotics and IV fluids.  If they are not helping her, they may be causing side effects that are uncomfortable.  She seems to have rapidly declined since yesterday, and if she stabilizes, she may be a candidate for residential hospice but if she continues to decline with this trajectory, she may have an in-hospital death.  Assessment & Plan:  Severe sepsis secondary to pneumonia and rapidly progressive cancer with tachycardia, tachypnea, fevers, and elevated lactic acid, hypotension, and poor mentation.  -Continue IVF -  blood cultures no growth to date -Continue vancomycin and zosyn -  PCCM assistance appreciated, they have signed off -  Palliative care assistance appreciated - Spoke to the family about taking her off telemetry and continuous pulse oximetry and transferring her to a MedSurg bed  Nausea, vomiting and abdominal pain, due to rapidly progressive cancer.  She seems comfortable currently.   - Continue antiemetics -  Use IV Dilaudid for breakthrough pain  Acute metabolic encephalopathy in setting of malignancy, metabolic acidosis/lactic acidosis, sepsis, worsening -  CT head negative for acute change -  Discontinue oral medications and transition to IV or sublingual only  Elevated LDH, HIGH, consistent with rapidly progressive lymphoma and liver involvement  Diffuse large B-cell lymphoma with associated anemia, thrombocytopenia, generalized pain, generalized weakness.   -  Oncology assistance appreciated -  Not a candidate for further therapies  Persistent SVT today, but no intervention for now, emphasizing comfort  Severe protein calorie malnutrition -  Appreciate nutrition assistance -  Diet liberalized, supplements ordered -  D/c dexamethasone  Possible dysphagia -  SLP  evaluation pending  Mild elevation in troponin likely demand ischemia, troponins flat and inconsistent with ACS -  Echo done in October 2018 consistent with EF of 55-60%, will not repeat.  She has severe MR.  Mild hyponatremia, mild transaminase, chronic normocytic anemia likely due to malignancy, chemotherapy, mild leukocytosis noted in the lab.  Continue to monitor.   Acute urinary retention, likely related to pain medication -  D/c flomax  -  Placed foley catheter on 12/13  DVT prophylaxis:  lovenox and SCDs Code Status:  DNR Family Communication:  Patient and extended family at bedside Disposition Plan:  Transfer to med-surg bed if stable enough to transfer  Consultants:   Spoke with Dr. Irene Limbo by phone, Oncology  Procedures:  none  Antimicrobials:  Anti-infectives (From admission, onward)   Start     Dose/Rate Route Frequency Ordered Stop   03/28/17 0900  vancomycin (VANCOCIN) 500 mg in sodium chloride 0.9 % 100 mL IVPB     500 mg 100 mL/hr over 60 Minutes Intravenous Every 24 hours 03/27/17 0957     03/27/17 0800  piperacillin-tazobactam (ZOSYN) IVPB 3.375 g     3.375 g 12.5 mL/hr over 240 Minutes Intravenous Every 8 hours 03/27/17 0729     03/27/17 0730  vancomycin (VANCOCIN) IVPB 1000 mg/200 mL premix     1,000 mg 200 mL/hr over 60 Minutes Intravenous  Once 03/27/17 0729 03/27/17 0920       Subjective: Somnolent, unable to answer questions  Objective: Vitals:   03/28/17 0900 03/28/17 1000 03/28/17 1100 03/28/17 1200  BP:    (!) 89/57  Pulse: (!) 162 (!) 145 (!) 158   Resp: 17 16 17    Temp:    98.8 F (37.1 C)  TempSrc:    Axillary  SpO2: 94% 93% 95%   Weight:      Height:        Intake/Output Summary (Last 24 hours) at 03/28/2017 1403 Last data filed at 03/28/2017 0800 Gross per 24 hour  Intake 2375 ml  Output 1225 ml  Net 1150 ml   Filed Weights   03/26/2017 1436 03/26/17 0252  Weight: 34 kg (75 lb) 43.9 kg (96 lb 12.5 oz)     Examination:  General exam:  Adult female, cachectic, ill-appearing, soundly asleep and difficult to arouse.  Unintelligible HEENT:  NCAT, dry mucous membranes Respiratory system: Coarse rales heard bilaterally anteriorly  cardiovascular system: Tachycardic, 3 out of 6 systolic murmur Gastrointestinal system: Hypoactive bowel sounds, moderate to severe distention, tender to palpation diffusely without rebound or guarding MSK:  Normal tone and bulk, 2+ pitting bilateral lower extremity edema Neuro: No obvious facial droop, extremely weak diffusely and unable to lift any extremities from the bed voluntarily  Data Reviewed: I have personally reviewed following labs and imaging studies  CBC: Recent Labs  Lab 03/22/2017 1513 03/26/17 0256 03/27/17 0348 03/28/17 0620  WBC 10.9* 10.3 14.3* 10.3  NEUTROABS 8.0*  --  10.6*  --   HGB 8.0* 8.1* 10.3* 9.7*  HCT 24.9* 25.0* 32.1* 30.8*  MCV 83.3 83.6 84.3 85.3  PLT 79* 77* 67* 60*   Basic Metabolic Panel: Recent Labs  Lab 04/14/2017 1513 03/26/17 0256 03/27/17 0348 03/27/17 1210 03/28/17 0620  NA 134* 134* 136  --  136  K 4.0 4.6 5.0  --  4.6  CL 96*  101 103  --  106  CO2 25 22 19*  --  15*  GLUCOSE 89 87 105*  --  84  BUN 21* 17 15  --  13  CREATININE 0.57 0.58 0.64  --  0.68  CALCIUM 9.7 9.4 9.7  --  9.1  MG  --   --  1.0*  --  1.7  PHOS  --   --   --  2.3* 2.7   GFR: Estimated Creatinine Clearance: 40.2 mL/min (by C-G formula based on SCr of 0.68 mg/dL). Liver Function Tests: Recent Labs  Lab 04/04/2017 1513 03/26/17 0256 03/27/17 0348 03/28/17 0620  AST 77* 73* 109* 109*  ALT 30 30 38 37  ALKPHOS 120 124 217* 193*  BILITOT 0.8 1.0 0.8 0.8  PROT 4.5* 4.1* 4.5* 4.1*  ALBUMIN 2.3* 2.1* 2.2* 1.8*   Recent Labs  Lab 03/27/17 1210  LIPASE 11   No results for input(s): AMMONIA in the last 168 hours. Coagulation Profile: Recent Labs  Lab 03/27/17 0810  INR 1.04   Cardiac Enzymes: Recent Labs  Lab  03/15/2017 1513 04/07/2017 2041 03/26/17 0256 03/26/17 1041  TROPONINI 0.05* 0.06* 0.05* 0.04*   BNP (last 3 results) No results for input(s): PROBNP in the last 8760 hours. HbA1C: No results for input(s): HGBA1C in the last 72 hours. CBG: No results for input(s): GLUCAP in the last 168 hours. Lipid Profile: No results for input(s): CHOL, HDL, LDLCALC, TRIG, CHOLHDL, LDLDIRECT in the last 72 hours. Thyroid Function Tests: No results for input(s): TSH, T4TOTAL, FREET4, T3FREE, THYROIDAB in the last 72 hours. Anemia Panel: No results for input(s): VITAMINB12, FOLATE, FERRITIN, TIBC, IRON, RETICCTPCT in the last 72 hours. Urine analysis:    Component Value Date/Time   COLORURINE YELLOW 03/27/2017 [place] 03/27/2017 0347   LABSPEC 1.020 03/27/2017 0347   PHURINE 5.0 03/27/2017 0347   GLUCOSEU NEGATIVE 03/27/2017 0347   HGBUR MODERATE (A) 03/27/2017 0347   BILIRUBINUR NEGATIVE 03/27/2017 0347   KETONESUR NEGATIVE 03/27/2017 0347   PROTEINUR NEGATIVE 03/27/2017 0347   UROBILINOGEN 0.2 04/30/2011 1145   NITRITE NEGATIVE 03/27/2017 0347   LEUKOCYTESUR SMALL (A) 03/27/2017 0347   Sepsis Labs: @LABRCNTIP (procalcitonin:4,lacticidven:4)  ) Recent Results (from the past 240 hour(s))  Culture, Urine     Status: None   Collection Time: 03/26/17  1:48 PM  Result Value Ref Range Status   Specimen Description URINE, CATHETERIZED  Final   Special Requests NONE  Final   Culture   Final    NO GROWTH Performed at [facility], [place] [address] [place], [place] [address]    Report Status 03/27/2017 FINAL  Final  Culture, blood (routine x 2)     Status: None (Preliminary result)   Collection Time: 03/27/17  3:55 AM  Result Value Ref Range Status   Specimen Description BLOOD LEFT FOREARM  Final   Special Requests   Final    BOTTLES DRAWN AEROBIC AND ANAEROBIC Blood Culture adequate volume   Culture   Final    NO GROWTH 1 DAY Performed at [facility],  [place] [address] [place], [place] [address]    Report Status PENDING  Incomplete  Culture, blood (routine x 2)     Status: None (Preliminary result)   Collection Time: 03/27/17  4:02 AM  Result Value Ref Range Status   Specimen Description BLOOD LEFT ANTECUBITAL  Final   Special Requests   Final    BOTTLES DRAWN AEROBIC AND  ANAEROBIC Blood Culture adequate volume   Culture   Final    NO GROWTH 1 DAY Performed at [facility], [place] [address] [place], [place] [address]    Report Status PENDING  Incomplete  MRSA PCR Screening     Status: None   Collection Time: 03/27/17 10:50 AM  Result Value Ref Range Status   MRSA by PCR NEGATIVE NEGATIVE Final    Comment:        The GeneXpert MRSA Assay (FDA approved for NASAL specimens only), is one component of a comprehensive MRSA colonization surveillance program. It is not intended to diagnose MRSA infection nor to guide or monitor treatment for MRSA infections.       Radiology Studies: Ct Head Wo Contrast  Result Date: 03/27/2017 CLINICAL DATA:  76 year old female with diffuse large B-cell lymphoma status post chemotherapy and radiation. Altered level of consciousness. EXAM: CT HEAD WITHOUT CONTRAST TECHNIQUE: Contiguous axial images were obtained from the base of the skull through the vertex without intravenous contrast. COMPARISON:  PET-CT 01/15/2017.  Brain MRI 08/22/2016. FINDINGS: Brain: Stable cerebral volume. No midline shift, ventriculomegaly, mass effect, evidence of mass lesion, intracranial hemorrhage or evidence of cortically based acute infarction. Gray-white matter differentiation is within normal limits throughout the brain. Vascular: Mild Calcified atherosclerosis at the skull base. No suspicious intracranial vascular hyperdensity. Skull: Stable bone mineralization. No acute or suspicious osseous lesion identified. Sinuses/Orbits: Chronic hyperostosis of the skull base and visible paranasal sinuses. Tympanic cavities and  mastoids remain clear. Other: Negative visible orbit and scalp soft tissues. IMPRESSION: No acute intracranial abnormality. Negative for age noncontrast CT appearance of the brain. Electronically Signed   By: Genevie Ann M.D.   On: 03/27/2017 14:08   Ct Abdomen Pelvis W Contrast  Result Date: 03/27/2017 CLINICAL DATA:  Diffuse large B-cell lymphoma. Abdominal pain. Fever and abscesses. EXAM: CT ABDOMEN AND PELVIS WITH CONTRAST TECHNIQUE: Multidetector CT imaging of the abdomen and pelvis was performed using the standard protocol following bolus administration of intravenous contrast. CONTRAST:  100 cc of Isovue-300 COMPARISON:  01/15/2017 FINDINGS: Lower chest: There are moderate bilateral pleural effusions with overlying compressive type atelectasis. Hepatobiliary: There are numerous low-attenuation lesions identified throughout both lobes of liver. This is a new finding when compared with previous examination. Index lesion within anterior right lobe of liver 1.1 cm, image 15 of series 2. Index lesion within the lateral segment of left lobe of liver measures 1.4 cm, image 18 of series 2. Posterior right lobe of liver lesion measures 1 cm, image 27 of series 2. Gallbladder normal. No biliary dilatation. Pancreas: Unremarkable. No pancreatic ductal dilatation or surrounding inflammatory changes. Spleen: Small low density structure within the spleen measures 7 mm, image 23 of series 2. New from previous exam. Adrenals/Urinary Tract: Normal appearance of the right adrenal gland. New left adrenal nodule/mass measures 2.9 x 2.8 cm, image number 28 of series 2. Nodule in the right adrenal gland measures 1.7 cm, image 26 of series 2. Complex mass within the inferior pole of the right kidney is again identified measuring 4.3 x 3.8 cm, image 43 of series 2. Similar to previous exam. There is progressive left-sided hydronephrosis. There is a new soft tissue mass within the left periaortic retroperitoneum extending into the  left renal hilum measuring 3.1 x 3.8 x 6.0 cm. This encases both the left renal vein and left renal artery. Bladder collapsed around a Foley catheter. Stomach/Bowel: Small hiatal hernia. No pathologic dilatation of the small bowel loops.  No abnormal dilatation of the colon. Vascular/Lymphatic: Aortic atherosclerosis. No aneurysm. Extensive adenopathy within the abdomen is identified. Retrocaval lymph node at the level of the right renal hilum measures 4.1 x 2.5 cm, image 37 of series 2. Previously 3.8 x 2.0 cm. Large necrotic lymph node within the portacaval region is new measuring 2.4 x 3.3 cm, image 29 of series 2. Nodal mass within the lower mesenteric measures 6.4 x 4.3 cm, image 46 of series 2. Previously 5.6 x 3.4 cm. Reproductive: Uterus and adnexal structures are not visualized and may be surgically absent. Other: There is new abdominal and pelvic ascites. Loculated fluid within the posterior pelvis is new measuring 5.3 x 8.3 x 8.9 cm. Diffuse body wall edema is identified compatible with anasarca. Musculoskeletal: Previous left hip arthroplasty. No aggressive lytic or sclerotic bone lesions identified. IMPRESSION: 1. Overall there has been interval progression of disease. 2. Interval development of multiple low-attenuation lesions throughout the liver compatible with metastatic disease. 3. Progressive periaortic adenopathy with involvement of the left renal hilum and worsening left-sided hydronephrosis. 4. Increase in a aortocaval and portacaval adenopathy. 5. New ascites and diffuse body wall edema. 6. Large loculated fluid collection within the posterior pelvis may represent abscess or loculated malignant ascites. 7. Bilateral adrenal gland lesions, new from previous exam. 8. Similar appearance of right kidney lower pole mass which is favored to represent a renal cell carcinoma. 9. Progressive bilateral pleural effusions. Electronically Signed   By: Kerby Moors M.D.   On: 03/27/2017 14:31   Dg Chest  Port 1 View  Result Date: 03/28/2017 CLINICAL DATA:  Pneumonia. EXAM: PORTABLE CHEST 1 VIEW COMPARISON:  03/27/2017. FINDINGS: Port-A-Cath in stable position. Stable cardiomegaly. Progressive diffuse left lung infiltrate. Progressive small left pleural effusion. Persistent mild right base atelectasis. No pneumothorax . IMPRESSION: 1. Port-A-Cath in stable position. 2. Progressive diffuse left lung infiltrate most consistent pneumonia. Progressive small left pleural effusion. Mild right base atelectasis again noted. 3. Stable cardiomegaly . Electronically Signed   By: Marcello Moores  Register   On: 03/28/2017 06:42   Dg Chest Port 1 View  Result Date: 03/27/2017 CLINICAL DATA:  Fever.  Large B-cell lymphoma EXAM: PORTABLE CHEST 1 VIEW COMPARISON:  March 25, 2017 FINDINGS: Port-A-Cath tip at cavoatrial junction. Huber needle is position within the port. No pneumothorax. There is airspace consolidation in the left lower lobe and probable inferior lingula with left pleural effusion. There is atelectatic change in the right base. Heart is mildly enlarged with pulmonary vascularity within normal limits. No adenopathy. There is degenerative change in each shoulder. IMPRESSION: Port-A-Cath as described without pneumothorax. Airspace consolidation left lower lobe and probable inferior lingula consistent with pneumonia. Small left pleural effusion. There is right base atelectasis. Cardiac silhouette is stable. Electronically Signed   By: Lowella Grip III M.D.   On: 03/27/2017 09:29     Scheduled Meds: . Chlorhexidine Gluconate Cloth  6 each Topical Daily  . dexamethasone  2 mg Oral Q breakfast  . docusate sodium  100 mg Oral Q12H  . enoxaparin (LOVENOX) injection  30 mg Subcutaneous Q24H  . hyaluronate sodium  1 application Topical BID  . lactose free nutrition  237 mL Oral TID BM  . magnesium oxide  400 mg Oral BID  . methocarbamol  500 mg Oral TID  . multivitamin with minerals  1 tablet Oral Daily  .  pantoprazole  20 mg Oral Daily  . sucralfate  1 g Oral QID  . tamsulosin  0.4 mg Oral  QPC supper   Continuous Infusions: . dextrose 5 % and 0.9% NaCl 125 mL/hr at 03/28/17 0800  . piperacillin-tazobactam Stopped (03/28/17 1200)  . vancomycin Stopped (03/28/17 1233)     LOS: 2 days    Time spent: 30 min    [name], MD [facility] Pager [phone]  If 7PM-7AM, please contact night-coverage [email or web address] Password [email or web address] 03/28/2017, 2:03 PM

## 2017-03-28 NOTE — Progress Notes (Signed)
PMT progress note.  The patient is resting in bed, she appears weaker, less awake/less alert than yesterday, she is not eating. She does not appear to have non verbal gestures of distress/discomfort (no wincing, no grimacing, no moaning). Grand son at bedside.   BP (!) 89/57 (BP Location: Left Arm)   Pulse (!) 158   Temp 98.8 F (37.1 C) (Axillary)   Resp 17   Ht 4\' 11"  (1.499 m)   Wt 43.9 kg (96 lb 12.5 oz)   SpO2 95%   BMI 19.55 kg/m  Labs and imaging noted.   Arouses some to voice command Resting comfortably Shallow regular pattern of breathing Regular No edema Abdomen appears distended  A/P: B cell lymphoma Bowel wall edema, possible abscess on CT abdomen Severe sepsis Recent possible HCAP Protein calorie severe malnutrition, multi factorial Possibly in the dying process.   PPS 10%  Discussed with grand son present in the room about continuing current mode of care for the next 24 hours or so, and then, to strongly consider full scope of comfort measures, d/c ABx and transferring the patient to residential hospice. I have discussed with him in detail about hospice philosophy of care and the type of care that is provided in a hospice house setting. He will discuss with his mother and aunts.   25 minutes spent [place] [phone]  [place] palliative medicine team

## 2017-03-29 LAB — COMPREHENSIVE METABOLIC PANEL
ALBUMIN: 1.8 g/dL — AB (ref 3.5–5.0)
ALK PHOS: 210 U/L — AB (ref 38–126)
ALT: 35 U/L (ref 14–54)
ANION GAP: 12 (ref 5–15)
AST: 108 U/L — AB (ref 15–41)
BILIRUBIN TOTAL: 1.1 mg/dL (ref 0.3–1.2)
BUN: 17 mg/dL (ref 6–20)
CALCIUM: 9.3 mg/dL (ref 8.9–10.3)
CO2: 17 mmol/L — AB (ref 22–32)
CREATININE: 0.96 mg/dL (ref 0.44–1.00)
Chloride: 108 mmol/L (ref 101–111)
GFR calc Af Amer: >60 mL/min (ref >60)
GFR calc non Af Amer: 56 mL/min — ABNORMAL LOW (ref >60)
GLUCOSE: 82 mg/dL (ref 65–99)
Potassium: 4.9 mmol/L (ref 3.5–5.1)
SODIUM: 137 mmol/L (ref 135–145)
TOTAL PROTEIN: 4.1 g/dL — AB (ref 6.5–8.1)

## 2017-03-29 LAB — CBC
HEMATOCRIT: 29.2 % — AB (ref 36.0–46.0)
HEMOGLOBIN: 9.2 g/dL — AB (ref 12.0–15.0)
MCH: 26.7 pg (ref 26.0–34.0)
MCHC: 31.5 g/dL (ref 30.0–36.0)
MCV: 84.6 fL (ref 78.0–100.0)
Platelets: 73 10*3/uL — ABNORMAL LOW (ref 150–400)
RBC: 3.45 MIL/uL — AB (ref 3.87–5.11)
RDW: 21.7 % — ABNORMAL HIGH (ref 11.5–15.5)
WBC: 10 10*3/uL (ref 4.0–10.5)

## 2017-03-29 LAB — MAGNESIUM: Magnesium: 1.8 mg/dL (ref 1.7–2.4)

## 2017-03-29 MED ORDER — IBUPROFEN 200 MG PO TABS: 400.0000 mg | ORAL_TABLET | ORAL | Status: DC | PRN

## 2017-03-29 MED ORDER — KETOROLAC TROMETHAMINE 15 MG/ML IJ SOLN: 15.0000 mg | Freq: Four times a day (QID) | INTRAMUSCULAR | Status: DC | PRN

## 2017-03-29 MED ORDER — SODIUM CHLORIDE 0.9 % IV BOLUS (SEPSIS): 500.0000 mL | Freq: Once | INTRAVENOUS | Status: DC

## 2017-03-29 MED ORDER — HYDROCERIN EX CREA
TOPICAL_CREAM | Freq: Two times a day (BID) | CUTANEOUS | Status: DC
  Administered 2017-03-29: 22:00:00 via TOPICAL
  Administered 2017-03-29: 1 via TOPICAL
  Administered 2017-03-31: 10:00:00 via TOPICAL
  Filled 2017-03-29: qty 113

## 2017-03-29 NOTE — Progress Notes (Addendum)
PROGRESS NOTE  Christina Peterson  EPP:[id number] DOB: 1940-11-19 DOA: 03/24/2017 PCP: Junie Panning, NP  Brief Narrative:   Christina Peterson is a 76 y.o. 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  Patient was accompanied by her 2 daughters in ER.  Patient lives by herself and she is gradually declining especially her functional status and her appetite. She usually walks with the help of walker but recently too weak to walk.  Has nausea but no vomiting.  Denies shortness of breath, cough.  CXR negative.  UA negative.  After discussion with oncologist on 12/12, decided to proceed with blood transfusion to help with energy.  She developed tachycardia, high fevers.  Blood bank did not feel this was a transfusion reaction.  Repeat CXR demonstrates LLL pneumonia, but she was also having abdominal distension and pain.  Mentation was waxing and waning and lactic acid was 9.4.  She was started on IV fluids and antibiotics and transferred to the stepdown unit.  Critical care was consulted.  She underwent a head CT which was negative for any acute change.  Her abdominal CT showed rapidly progressive cancer with large intra-abdominal lymphadenopathy and masses, some of which were eroding into her left kidney and causing left hydronephrosis.  She had innumerable liver mets.  She had a conversation with critical care and palliative medicine on 12/13 and was transitioned to DNR.  Despite IV fluids and antibiotics, she has continued to decline with fevers, hypotension, ongoing tachycardia.  She is not stable for transfer to residential hospice.  Anticipate an in-hospital death.    Assessment & Plan:  Severe sepsis secondary to pneumonia and rapidly progressive cancer with  tachycardia, tachypnea, fevers, and elevated lactic acid, hypotension, and poor mentation.  -Discontinue IVF -  blood cultures no growth to date -Discontinue vancomycin as MRSA PCR is negative  -Continue Zosyn  -  Palliative care assistance appreciated  Nausea, vomiting and abdominal pain, due to rapidly progressive cancer.  She required several doses of pain medication overnight. - Continue antiemetics -Continue IV Dilaudid for breakthrough pain  Acute metabolic encephalopathy in setting of malignancy, metabolic acidosis/lactic acidosis, sepsis, worsening -  CT head negative for acute change -  IV medications only  Elevated LDH, HIGH, consistent with rapidly progressive lymphoma and liver involvement  Diffuse large B-cell lymphoma with associated anemia, thrombocytopenia, generalized pain, generalized weakness.   -  Oncology assistance appreciated -  Not a candidate for further therapies  Persistent SVT today, but no intervention for now, emphasizing comfort  Severe protein calorie malnutrition -Advised family not to feed her and less she is awake, alert, and sitting up.  Possible dysphagia, no further workup at this time  Mild elevation in troponin likely demand ischemia, troponins flat and inconsistent with ACS -  Echo done in October 2018 consistent with EF of 55-60%, will not repeat.  She has severe MR.  Mild hyponatremia, mild transaminase, chronic normocytic anemia likely due to malignancy, chemotherapy, mild leukocytosis noted in the lab.  Continue to monitor.   Acute urinary retention, likely related to pain medication -  Placed foley catheter on 12/13  DVT prophylaxis: SCDs Code Status:  DNR Family Communication:  Patient and extended family at bedside Disposition Plan:  Transfer to med-surg bed, anticipate in-hospital death  Consultants:   Spoke with Dr.  Kale by phone, Oncology  Critical care  Palliative care  Procedures:  none  Antimicrobials:    Anti-infectives (From admission, onward)   Start     Dose/Rate Route Frequency Ordered Stop   03/28/17 0900  vancomycin (VANCOCIN) 500 mg in sodium chloride 0.9 % 100 mL IVPB  Status:  Discontinued     500 mg 100 mL/hr over 60 Minutes Intravenous Every 24 hours 03/27/17 0957 03/29/17 0831   03/27/17 0800  piperacillin-tazobactam (ZOSYN) IVPB 3.375 g     3.375 g 12.5 mL/hr over 240 Minutes Intravenous Every 8 hours 03/27/17 0729     03/27/17 0730  vancomycin (VANCOCIN) IVPB 1000 mg/200 mL premix     1,000 mg 200 mL/hr over 60 Minutes Intravenous  Once 03/27/17 0729 03/27/17 0920       Subjective: Somnolent, unable to answer questions  Objective: Vitals:   03/29/17 1012 03/29/17 1120 03/29/17 1200 03/29/17 1442  BP:    (!) 89/55  Pulse:   (!) 116 (!) 103  Resp:   15 20  Temp: (!) 103 F (39.4 C) (!) 101.4 F (38.6 C)  98.2 F (36.8 C)  TempSrc: Oral Oral  Axillary  SpO2:   99% 100%  Weight:    44.5 kg (98 lb)  Height:        Intake/Output Summary (Last 24 hours) at 03/29/2017 1757 Last data filed at 03/29/2017 0800 Gross per 24 hour  Intake 1600 ml  Output 500 ml  Net 1100 ml   Filed Weights   03/31/2017 1436 03/26/17 0252 03/29/17 1442  Weight: 34 kg (75 lb) 43.9 kg (96 lb 12.5 oz) 44.5 kg (98 lb)    Examination:  General exam:  Adult female, cachectic, asleep and I was unable to arouse her with light touch today.  No acute distress.  HEENT:  NCAT, MMM Respiratory system: Coarse rales at the bilateral bases Cardiovascular system: Tachycardic, regular rhythm, 3 out of 6 systolic murmur Gastrointestinal system: Normal active bowel sounds, soft, moderate to severe distention, patient grimaces slightly with palpation MSK:  Normal tone and bulk, 2+ pitting edema  Data Reviewed: I have personally reviewed following labs and imaging studies  CBC: Recent Labs  Lab 04/10/2017 1513 03/26/17 0256 03/27/17 0348 03/28/17 0620 03/29/17 0345  WBC 10.9* 10.3 14.3* 10.3  10.0  NEUTROABS 8.0*  --  10.6*  --   --   HGB 8.0* 8.1* 10.3* 9.7* 9.2*  HCT 24.9* 25.0* 32.1* 30.8* 29.2*  MCV 83.3 83.6 84.3 85.3 84.6  PLT 79* 77* 67* 60* 73*   Basic Metabolic Panel: Recent Labs  Lab 04/10/2017 1513 03/26/17 0256 03/27/17 0348 03/27/17 1210 03/28/17 0620 03/29/17 0345  NA 134* 134* 136  --  136 137  K 4.0 4.6 5.0  --  4.6 4.9  CL 96* 101 103  --  106 108  CO2 25 22 19*  --  15* 17*  GLUCOSE 89 87 105*  --  84 82  BUN 21* 17 15  --  13 17  CREATININE 0.57 0.58 0.64  --  0.68 0.96  CALCIUM 9.7 9.4 9.7  --  9.1 9.3  MG  --   --  1.0*  --  1.7 1.8  PHOS  --   --   --  2.3* 2.7  --    GFR: Estimated Creatinine Clearance: 33.5 mL/min (by C-G formula based on SCr of 0.96 mg/dL). Liver Function Tests: Recent Labs  Lab 03/18/2017 1513 03/26/17 0256 03/27/17 0348 03/28/17  7371 03/29/17 0345  AST 77* 73* 109* 109* 108*  ALT 30 30 38 37 35  ALKPHOS 120 124 217* 193* 210*  BILITOT 0.8 1.0 0.8 0.8 1.1  PROT 4.5* 4.1* 4.5* 4.1* 4.1*  ALBUMIN 2.3* 2.1* 2.2* 1.8* 1.8*   Recent Labs  Lab 03/27/17 1210  LIPASE 11   No results for input(s): AMMONIA in the last 168 hours. Coagulation Profile: Recent Labs  Lab 03/27/17 0810  INR 1.04   Cardiac Enzymes: Recent Labs  Lab 04/13/2017 1513 04/10/2017 2041 03/26/17 0256 03/26/17 1041  TROPONINI 0.05* 0.06* 0.05* 0.04*   BNP (last 3 results) No results for input(s): PROBNP in the last 8760 hours. HbA1C: No results for input(s): HGBA1C in the last 72 hours. CBG: No results for input(s): GLUCAP in the last 168 hours. Lipid Profile: No results for input(s): CHOL, HDL, LDLCALC, TRIG, CHOLHDL, LDLDIRECT in the last 72 hours. Thyroid Function Tests: No results for input(s): TSH, T4TOTAL, FREET4, T3FREE, THYROIDAB in the last 72 hours. Anemia Panel: No results for input(s): VITAMINB12, FOLATE, FERRITIN, TIBC, IRON, RETICCTPCT in the last 72 hours. Urine analysis:    Component Value Date/Time   COLORURINE  YELLOW 03/27/2017 Person 03/27/2017 0347   LABSPEC 1.020 03/27/2017 0347   PHURINE 5.0 03/27/2017 0347   GLUCOSEU NEGATIVE 03/27/2017 0347   HGBUR MODERATE (A) 03/27/2017 0347   BILIRUBINUR NEGATIVE 03/27/2017 0347   KETONESUR NEGATIVE 03/27/2017 0347   PROTEINUR NEGATIVE 03/27/2017 0347   UROBILINOGEN 0.2 04/30/2011 1145   NITRITE NEGATIVE 03/27/2017 0347   LEUKOCYTESUR SMALL (A) 03/27/2017 0347   Sepsis Labs: @LABRCNTIP (procalcitonin:4,lacticidven:4)  ) Recent Results (from the past 240 hour(s))  Culture, Urine     Status: None   Collection Time: 03/26/17  1:48 PM  Result Value Ref Range Status   Specimen Description URINE, CATHETERIZED  Final   Special Requests NONE  Final   Culture   Final    NO GROWTH Performed at [facility], [place] [address] [place], [place] [address]    Report Status 03/27/2017 FINAL  Final  Culture, blood (routine x 2)     Status: None (Preliminary result)   Collection Time: 03/27/17  3:55 AM  Result Value Ref Range Status   Specimen Description BLOOD LEFT FOREARM  Final   Special Requests   Final    BOTTLES DRAWN AEROBIC AND ANAEROBIC Blood Culture adequate volume   Culture   Final    NO GROWTH 2 DAYS Performed at [facility], [place] [address] [place], [place] [address]    Report Status PENDING  Incomplete  Culture, blood (routine x 2)     Status: None (Preliminary result)   Collection Time: 03/27/17  4:02 AM  Result Value Ref Range Status   Specimen Description BLOOD LEFT ANTECUBITAL  Final   Special Requests   Final    BOTTLES DRAWN AEROBIC AND ANAEROBIC Blood Culture adequate volume   Culture   Final    NO GROWTH 2 DAYS Performed at [facility], [place] [address], [place], [place] [address]    Report Status PENDING  Incomplete  MRSA PCR Screening     Status: None   Collection Time: 03/27/17 10:50 AM  Result Value Ref Range Status   MRSA by PCR NEGATIVE NEGATIVE Final    Comment:        The  GeneXpert MRSA Assay (FDA approved for NASAL specimens only), is one component of a comprehensive MRSA colonization surveillance  program. It is not intended to diagnose MRSA infection nor to guide or monitor treatment for MRSA infections.       Radiology Studies: Dg Chest Port 1 View  Result Date: 03/28/2017 CLINICAL DATA:  Pneumonia. EXAM: PORTABLE CHEST 1 VIEW COMPARISON:  03/27/2017. FINDINGS: Port-A-Cath in stable position. Stable cardiomegaly. Progressive diffuse left lung infiltrate. Progressive small left pleural effusion. Persistent mild right base atelectasis. No pneumothorax . IMPRESSION: 1. Port-A-Cath in stable position. 2. Progressive diffuse left lung infiltrate most consistent pneumonia. Progressive small left pleural effusion. Mild right base atelectasis again noted. 3. Stable cardiomegaly . Electronically Signed   By: Harvest   On: 03/28/2017 06:42     Scheduled Meds: . Chlorhexidine Gluconate Cloth  6 each Topical Daily  . hyaluronate sodium  1 application Topical BID  . hydrocerin   Topical BID   Continuous Infusions: . piperacillin-tazobactam 3.375 g (03/29/17 1642)     LOS: 3 days    Time spent: 30 min    [name], MD [facility] Pager [phone]  If 7PM-7AM, please contact night-coverage [email or web address] Password [facility] 03/29/2017, 5:57 PM

## 2017-03-29 NOTE — Progress Notes (Signed)
Physical Therapy Discharge Patient Details Name: Christina Peterson MRN: [id number] DOB: Mar 29, 1941 Today's Date: 03/29/2017 Time:  -     Patient discharged from PT services secondary to medical decline - will need to re-order PT to resume therapy services.  Please see latest therapy progress note for current level of functioning and progress toward goals.     GP     Claretha Cooper 03/29/2017, 3:49 PM Tresa Endo PT [phone]

## 2017-03-29 NOTE — Progress Notes (Signed)
PMT progress note.  Essentially unresponsive, resting in bed Large extended family at bedside holding vigil and praying for the patient  BP 107/71   Pulse (!) 117   Temp (!) 103 F (39.4 C) (Oral)   Resp 18   Ht 4\' 11"  (1.499 m)   Wt 43.9 kg (96 lb 12.5 oz)   SpO2 98%   BMI 19.55 kg/m  Labs and imaging noted.   Arouses some to voice command Resting comfortably Shallow regular pattern of breathing Regular No edema Abdomen appears distended  A/P: B cell lymphoma Bowel wall edema, possible abscess on CT abdomen Severe sepsis Recent possible HCAP Protein calorie severe malnutrition, multi factorial actively dying process underway here, in my opinion Terminal fever.   PPS 10%  Discussed with family present in the room about gentle comfort measures, that the patient might be soon approaching end of life  End of life signs and symptoms such as terminal fever, more edema in extremities due to 3rd spacing, rapid shallow/apneic spells of breathing all discussed in detail with family. Sense of touch and sense of hearing are the last ones to go away, encouraged family to provide patient with presence and words of encouragement at this point.   Prognosis few hours to some very limited number of days discussed frankly and compassionately with family. They are in full understanding of how sick the patient is.   Patient likely not stable enough to transfer to residential hospice, anticipate hospital death Continue current measures, appreciate excellent nursing care   25 minutes spent Loistine Chance MD [phone]  [phone]  [place] palliative medicine team

## 2017-03-29 NOTE — Progress Notes (Signed)
Pt's family, including three adult children, sisters, grand/great grandchildren, nephew (a 51th child joined at the end of our visit) were present when I arrived.  The family in the room did not seem to have much information, but a daughter who joined [place] said they had said what is going on, but she did not elaborate. Her family spoke early on of their faith and know everything is in God's hands. Her son said he has seen God raise people from the dead and believes he can do this.  I joined the family according to their faith and as part of our conversation on faith we also talked about the will of God and what His will may be for Miss Christina Peterson. The family joined around the bedside for prayer and were very appreciative of the visit and prayer. Pt's granddaughter said pt moved her hand during prayer. Family was encouraged to ask for Chaplain support whenever needed. Please page [phone]. Ernest Haber, MDiv   03/29/17 1200  Clinical Encounter Type  Visited With Family

## 2017-03-30 LAB — TYPE AND SCREEN
ABO/RH(D): O POS
ANTIBODY SCREEN: NEGATIVE
Unit division: 0
Unit division: 0

## 2017-03-30 LAB — BPAM RBC
BLOOD PRODUCT EXPIRATION DATE: 201901082359
Blood Product Expiration Date: 201901082359
ISSUE DATE / TIME: 201812121821
Unit Type and Rh: 5100
Unit Type and Rh: 5100

## 2017-03-30 MED ORDER — LIP MEDEX EX OINT
TOPICAL_OINTMENT | CUTANEOUS | Status: DC | PRN
  Filled 2017-03-30: qty 7

## 2017-03-30 NOTE — Progress Notes (Signed)
PMT progress note.  Essentially unresponsive, resting in bed Large extended family at bedside holding vigil and praying for the patient  BP 111/80 (BP Location: Left Arm)   Pulse (!) 122   Temp 99.1 F (37.3 C) (Axillary)   Resp 18   Ht 4\' 11"  (1.499 m)   Wt 44.5 kg (98 lb)   SpO2 100%   BMI 19.79 kg/m  Labs and imaging noted.   Arouses some to voice command Resting comfortably Shallow regular pattern of breathing Regular No edema Abdomen appears distended  A/P: B cell lymphoma Bowel wall edema, possible abscess on CT abdomen Severe sepsis Recent possible HCAP Protein calorie severe malnutrition, multi factorial actively dying process underway here, in my opinion Terminal fever.   PPS 10%  Discussed with family present in the room about gentle comfort measures, that the patient might be soon approaching end of life  End of life signs and symptoms such as terminal fever, more edema in extremities due to 3rd spacing, rapid shallow/apneic spells of breathing all discussed in detail with family. Sense of touch and sense of hearing are the last ones to go away, encouraged family to provide patient with presence and words of encouragement at this point.   Prognosis few hours to some very limited number of days discussed frankly and compassionately with family. They are in full understanding of how sick the patient is.   Patient likely not stable enough to transfer to residential hospice, anticipate hospital death Continue current measures, appreciate excellent nursing care..  Additional discussions with family about use of antibiotics at end of life. The family wishes to continue with antibiotics for now, they would like for the abdominal infection to not get worse. Discussed in detail, offered support. Death appears imminent, continue to support the family.    25 minutes spent [place] [phone]  [place] palliative medicine team

## 2017-03-30 NOTE — Progress Notes (Addendum)
PROGRESS NOTE  Christina Peterson  XLK:[id number] DOB: [birth year] DOA: 03/30/2017 PCP: Junie Panning, NP  Brief Narrative:   Christina Peterson is a 76 y.o. 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  Patient was accompanied by her 2 daughters in ER.  Patient lives by herself and she is gradually declining especially her functional status and her appetite. She usually walks with the help of walker but recently too weak to walk.  Has nausea but no vomiting.  Denies shortness of breath, cough.  CXR negative.  UA negative.  After discussion with oncologist on 12/12, decided to proceed with blood transfusion to help with energy.  She developed tachycardia, high fevers.  Blood bank did not feel this was a transfusion reaction.  Repeat CXR demonstrates LLL pneumonia, but she was also having abdominal distension and pain.  Mentation was waxing and waning and lactic acid was 9.4.  She was started on IV fluids and antibiotics and transferred to the stepdown unit.  Critical care was consulted.  She underwent a head CT which was negative for any acute change.  Her abdominal CT showed rapidly progressive cancer with large intra-abdominal lymphadenopathy and masses, some of which were eroding into her left kidney and causing left hydronephrosis.  She had innumerable liver mets.  She had a conversation with critical care and palliative medicine on 12/13 and was transitioned to DNR.  Despite IV fluids and antibiotics, she has continued to decline with fevers, hypotension, ongoing tachycardia.  She seems to have stabilized some.  Will readdress possibility of residential hospice.  Assessment & Plan:  Severe sepsis secondary to pneumonia and rapidly progressive cancer with  tachycardia, tachypnea, fevers, and elevated lactic acid, hypotension, and poor mentation.  -  blood cultures no growth to date -  Continue Zosyn pending further discussions with family -  Palliative care assistance appreciated - Social work consult for possible residential hospice  Nausea, vomiting and abdominal pain, due to rapidly progressive cancer.  She has received 5 doses of IV Dilaudid over 24 hours - Continue antiemetics -Continue IV Dilaudid as needed  Acute metabolic encephalopathy in setting of malignancy, metabolic acidosis/lactic acidosis, sepsis, worsening -  CT head negative for acute change -  IV medications only  Elevated LDH, HIGH, consistent with rapidly progressive lymphoma and liver involvement  Diffuse large B-cell lymphoma with associated anemia, thrombocytopenia, generalized pain, generalized weakness.   -  Oncology assistance appreciated -  Not a candidate for further therapies  SVT, no intervention for now, emphasizing comfort  Severe protein calorie malnutrition -Advised family not to feed her and less she is awake, alert, and sitting up.  Possible dysphagia, no further workup at this time  Mild elevation in troponin likely demand ischemia, troponins flat and inconsistent with ACS -  Echo done in October 2018 consistent with EF of 55-60%, will not repeat.  She has severe MR.  Mild hyponatremia, mild transaminase, chronic normocytic anemia likely due to malignancy, chemotherapy, mild leukocytosis noted in the lab.  Continue to monitor.   Acute urinary retention, likely related to pain medication -  Placed foley catheter on 12/13  DVT prophylaxis: Comfort measures Code Status:  DNR Family Communication:  Patient and extended family at bedside Disposition Plan: Patient appears to have stabilized some.  We will readdress the possibility of residential  hospice.  Consultants:   Spoke with Dr. Irene Limbo by phone, Oncology  Critical care  Palliative  care  Procedures:  none  Antimicrobials:  Anti-infectives (From admission, onward)   Start     Dose/Rate Route Frequency Ordered Stop   03/28/17 0900  vancomycin (VANCOCIN) 500 mg in sodium chloride 0.9 % 100 mL IVPB  Status:  Discontinued     500 mg 100 mL/hr over 60 Minutes Intravenous Every 24 hours 03/27/17 0957 03/29/17 0831   03/27/17 0800  piperacillin-tazobactam (ZOSYN) IVPB 3.375 g     3.375 g 12.5 mL/hr over 240 Minutes Intravenous Every 8 hours 03/27/17 0729     03/27/17 0730  vancomycin (VANCOCIN) IVPB 1000 mg/200 mL premix     1,000 mg 200 mL/hr over 60 Minutes Intravenous  Once 03/27/17 0729 03/27/17 0920       Subjective: Somnolent, unable to answer questions.  States that she needed some pain medication overnight.  She will open her eyes occasionally and try to speak with her family although she is unintelligible.  Objective: Vitals:   03/29/17 1200 03/29/17 1442 03/29/17 2100 03/30/17 0616  BP:  (!) 89/55  111/80  Pulse: (!) 116 (!) 103  (!) 122  Resp: 15 20  18   Temp:  98.2 F (36.8 C) 98 F (36.7 C) 99.1 F (37.3 C)  TempSrc:  Axillary Axillary Axillary  SpO2: 99% 100%  100%  Weight:  44.5 kg (98 lb)  44.5 kg (98 lb)  Height:        Intake/Output Summary (Last 24 hours) at 03/30/2017 1244 Last data filed at 03/30/2017 1147 Gross per 24 hour  Intake 250 ml  Output 400 ml  Net -150 ml   Filed Weights   03/26/17 0252 03/29/17 1442 03/30/17 0616  Weight: 43.9 kg (96 lb 12.5 oz) 44.5 kg (98 lb) 44.5 kg (98 lb)    Examination:  General exam:  Adult female, cachectic, asleep and I was unable to arouse her.  He recently received a dose of IV Dilaudid no acute distress.  HEENT:  NCAT, MMM Respiratory system: Coarse rales at the bilateral bases, no wheezes or rhonchi Cardiovascular system: Tachycardic, regular rhythm 3-6 systolic murmur warm extremities Gastrointestinal system: Normal active bowel sounds, soft, moderately distended with palpable  abdominal mass, nontender MSK:  Normal tone and bulk, 2+ pitting edema bilateral lower extremities   Data Reviewed: I have personally reviewed following labs and imaging studies  CBC: Recent Labs  Lab 04/12/2017 1513 03/26/17 0256 03/27/17 0348 03/28/17 0620 03/29/17 0345  WBC 10.9* 10.3 14.3* 10.3 10.0  NEUTROABS 8.0*  --  10.6*  --   --   HGB 8.0* 8.1* 10.3* 9.7* 9.2*  HCT 24.9* 25.0* 32.1* 30.8* 29.2*  MCV 83.3 83.6 84.3 85.3 84.6  PLT 79* 77* 67* 60* 73*   Basic Metabolic Panel: Recent Labs  Lab 03/17/2017 1513 03/26/17 0256 03/27/17 0348 03/27/17 1210 03/28/17 0620 03/29/17 0345  NA 134* 134* 136  --  136 137  K 4.0 4.6 5.0  --  4.6 4.9  CL 96* 101 103  --  106 108  CO2 25 22 19*  --  15* 17*  GLUCOSE 89 87 105*  --  84 82  BUN 21* 17 15  --  13 17  CREATININE 0.57 0.58 0.64  --  0.68 0.96  CALCIUM 9.7 9.4 9.7  --  9.1 9.3  MG  --   --  1.0*  --  1.7 1.8  PHOS  --   --   --  2.3* 2.7  --    GFR: Estimated Creatinine Clearance: 33.5 mL/min (by C-G formula based on SCr of 0.96 mg/dL). Liver Function Tests: Recent Labs  Lab 03/24/2017 1513 03/26/17 0256 03/27/17 0348 03/28/17 0620 03/29/17 0345  AST 77* 73* 109* 109* 108*  ALT 30 30 38 37 35  ALKPHOS 120 124 217* 193* 210*  BILITOT 0.8 1.0 0.8 0.8 1.1  PROT 4.5* 4.1* 4.5* 4.1* 4.1*  ALBUMIN 2.3* 2.1* 2.2* 1.8* 1.8*   Recent Labs  Lab 03/27/17 1210  LIPASE 11   No results for input(s): AMMONIA in the last 168 hours. Coagulation Profile: Recent Labs  Lab 03/27/17 0810  INR 1.04   Cardiac Enzymes: Recent Labs  Lab 03/28/2017 1513 04/07/2017 2041 03/26/17 0256 03/26/17 1041  TROPONINI 0.05* 0.06* 0.05* 0.04*   BNP (last 3 results) No results for input(s): PROBNP in the last 8760 hours. HbA1C: No results for input(s): HGBA1C in the last 72 hours. CBG: No results for input(s): GLUCAP in the last 168 hours. Lipid Profile: No results for input(s): CHOL, HDL, LDLCALC, TRIG, CHOLHDL, LDLDIRECT in  the last 72 hours. Thyroid Function Tests: No results for input(s): TSH, T4TOTAL, FREET4, T3FREE, THYROIDAB in the last 72 hours. Anemia Panel: No results for input(s): VITAMINB12, FOLATE, FERRITIN, TIBC, IRON, RETICCTPCT in the last 72 hours. Urine analysis:    Component Value Date/Time   COLORURINE YELLOW 03/27/2017 [place] 03/27/2017 0347   LABSPEC 1.020 03/27/2017 0347   PHURINE 5.0 03/27/2017 0347   GLUCOSEU NEGATIVE 03/27/2017 0347   HGBUR MODERATE (A) 03/27/2017 0347   BILIRUBINUR NEGATIVE 03/27/2017 0347   KETONESUR NEGATIVE 03/27/2017 0347   PROTEINUR NEGATIVE 03/27/2017 0347   UROBILINOGEN 0.2 04/30/2011 1145   NITRITE NEGATIVE 03/27/2017 0347   LEUKOCYTESUR SMALL (A) 03/27/2017 0347   Sepsis Labs: @LABRCNTIP (procalcitonin:4,lacticidven:4)  ) Recent Results (from the past 240 hour(s))  Culture, Urine     Status: None   Collection Time: 03/26/17  1:48 PM  Result Value Ref Range Status   Specimen Description URINE, CATHETERIZED  Final   Special Requests NONE  Final   Culture   Final    NO GROWTH Performed at [place] [facility], [place] [address] [place], [place] [address]    Report Status 03/27/2017 FINAL  Final  Culture, blood (routine x 2)     Status: None (Preliminary result)   Collection Time: 03/27/17  3:55 AM  Result Value Ref Range Status   Specimen Description BLOOD LEFT FOREARM  Final   Special Requests   Final    BOTTLES DRAWN AEROBIC AND ANAEROBIC Blood Culture adequate volume   Culture   Final    NO GROWTH 3 DAYS Performed at [facility], [place] [address] [place], [place] [address]    Report Status PENDING  Incomplete  Culture, blood (routine x 2)     Status: None (Preliminary result)   Collection Time: 03/27/17  4:02 AM  Result Value Ref Range Status   Specimen Description BLOOD LEFT ANTECUBITAL  Final   Special Requests   Final    BOTTLES DRAWN AEROBIC AND ANAEROBIC Blood Culture adequate volume   Culture   Final    NO  GROWTH 3 DAYS Performed at [facility], [place] [address], [place], [place] [address]    Report Status PENDING  Incomplete  MRSA PCR Screening     Status: None   Collection Time: 03/27/17 10:50 AM  Result Value Ref Range Status  MRSA by PCR NEGATIVE NEGATIVE Final    Comment:        The GeneXpert MRSA Assay (FDA approved for NASAL specimens only), is one component of a comprehensive MRSA colonization surveillance program. It is not intended to diagnose MRSA infection nor to guide or monitor treatment for MRSA infections.       Radiology Studies: No results found.   Scheduled Meds: . Chlorhexidine Gluconate Cloth  6 each Topical Daily  . hyaluronate sodium  1 application Topical BID  . hydrocerin   Topical BID   Continuous Infusions: . piperacillin-tazobactam Stopped (03/30/17 1127)     LOS: 4 days    Time spent: 30 min    [name], MD [facility] Pager [phone]  If 7PM-7AM, please contact night-coverage [email or web address] Password [facility] 03/30/2017, 12:44 PM

## 2017-03-30 NOTE — Progress Notes (Signed)
Pharmacy Antibiotic Note  Christina Peterson is a 76 y.o. female with diffused large B-cell lymphoma presented to the ED on 04/08/2017 with generalized weakness.  She was febrile after first unit of blood transfusion on 12/12 and remains febrile on 12/13.  PCT elevated at 2.29 and LA at 9.4.  CXR showed findings consistent with LL PNA.  To start vancomycin and zosyn for sepsis and PNA. Now on zosyn alone.   Today, 03/30/2017: Day #4 abx  Plan: - zosyn 3.375 gm IV q8h (infuse over 4 hrs) remains appropriate - follow length of therapy - pharmacy will follow at a distance  _____________________________  Height: 4\' 11"  (149.9 cm) Weight: 98 lb (44.5 kg) IBW/kg (Calculated) : 43.2  Temp (24hrs), Avg:98.4 F (36.9 C), Min:98 F (36.7 C), Max:99.1 F (37.3 C)  Recent Labs  Lab 03/27/2017 1513 03/26/17 0256 03/27/17 0348 03/27/17 0810 03/27/17 1210 03/28/17 0620 03/29/17 0345  WBC 10.9* 10.3 14.3*  --   --  10.3 10.0  CREATININE 0.57 0.58 0.64  --   --  0.68 0.96  LATICACIDVEN  --   --   --  9.4* 10.1*  --   --     Estimated Creatinine Clearance: 33.5 mL/min (by C-G formula based on SCr of 0.96 mg/dL).    No Known Allergies  Antimicrobials this admission:  12/13 vanc>> 12/15 12/13 zosyn>>  Dose adjustments this admission:   Microbiology results:  12/13 BCx x2:  NGTD 12/13 UCx: NG 12/13 UA: small leuk, rare bacter 12/13 MRSA PCR: neg  Thank you for allowing pharmacy to be a part of this patient's care.  Doreene Eland, PharmD, BCPS.   Pager: [phone] 03/30/2017 12:21 PM

## 2017-03-31 ENCOUNTER — Telehealth: Payer: Self-pay | Admitting: Hematology

## 2017-03-31 MED ORDER — SODIUM CHLORIDE 0.9 % IV SOLN
5.0000 mg/h | INTRAVENOUS | Status: DC
  Administered 2017-03-31: 5 mg/h via INTRAVENOUS
  Filled 2017-03-31: qty 10

## 2017-03-31 MED ORDER — GLYCOPYRROLATE 0.2 MG/ML IJ SOLN: 0.3000 mg | Freq: Four times a day (QID) | INTRAMUSCULAR | Status: DC | PRN

## 2017-03-31 NOTE — Progress Notes (Signed)
LCSW acknowledges consult for SNF placement.   According to palliative note anticipate hospital death.  CSW signing off. Please consult if new needs arise.   Carolin Coy Zena [place]

## 2017-03-31 NOTE — Telephone Encounter (Signed)
Rescheduled appt from 12/10 - left voicemail for patient's daughter regarding appt.

## 2017-03-31 NOTE — Progress Notes (Addendum)
PROGRESS NOTE  Christina Peterson  JGO:[id number] DOB: [birth year] DOA: 03/21/2017 PCP: Junie Panning, NP  Brief Narrative:   Christina Peterson is a 76 y.o. 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  Patient was accompanied by her 2 daughters in ER.  Patient lives by herself and she is gradually declining especially her functional status and her appetite. She usually walks with the help of walker but recently too weak to walk.  Has nausea but no vomiting.  Denies shortness of breath, cough.  CXR negative.  UA negative.  After discussion with oncologist on 12/12, decided to proceed with blood transfusion to help with energy.  She developed tachycardia, high fevers.  Blood bank did not feel this was a transfusion reaction.  Repeat CXR demonstrates LLL pneumonia, but she was also having abdominal distension and pain.  Mentation was waxing and waning and lactic acid was 9.4.  She was started on IV fluids and antibiotics and transferred to the stepdown unit.  Critical care was consulted.  She underwent a head CT which was negative for any acute change.  Her abdominal CT showed rapidly progressive cancer with large intra-abdominal lymphadenopathy and masses, some of which were eroding into her left kidney and causing left hydronephrosis.  She had innumerable liver mets.  She had a conversation with critical care and palliative medicine on 12/13 and was transitioned to DNR.  Despite IV fluids and antibiotics, she has continued to decline with fevers, hypotension, ongoing tachycardia.  Anticipate in hospital death.  Currently on morphine gtt for pain.   Assessment & Plan:  Severe sepsis secondary to pneumonia and rapidly progressive cancer with tachycardia, tachypnea,  fevers, and elevated lactic acid, hypotension, and poor mentation.  -  Blood cultures no growth to date - Family has elected to stop Zosyn -  Palliative care assistance appreciated  Nausea, vomiting and abdominal pain, due to rapidly progressive cancer.  Started on morphine gtt overnight due to more persistent pain - Continue antiemetics - continue morphine gtt -Continue IV Dilaudid as needed  Acute metabolic encephalopathy in setting of malignancy, metabolic acidosis/lactic acidosis, sepsis, worsening -  CT head negative for acute change -  IV medications only  Elevated LDH, HIGH, consistent with rapidly progressive lymphoma and liver involvement  Diffuse large B-cell lymphoma with associated anemia, thrombocytopenia, generalized pain, generalized weakness.   -  Oncology assistance appreciated -  Not a candidate for further therapies  SVT, no intervention for now, emphasizing comfort  Severe protein calorie malnutrition -Advised family not to feed her and less she is awake, alert, and sitting up.  Possible dysphagia, no further workup at this time  Mild elevation in troponin likely demand ischemia, troponins flat and inconsistent with ACS -  Echo done in October 2018 consistent with EF of 55-60%, will not repeat.  She has severe MR.  Mild hyponatremia, mild transaminase, chronic normocytic anemia likely due to malignancy, chemotherapy, mild leukocytosis noted in the lab.  Continue to monitor.   Acute urinary retention, likely related to pain medication -  Placed foley catheter on 12/13  DVT prophylaxis: Comfort measures Code Status:  DNR Family Communication:  Patient alone, extended family at bedside Disposition Plan:  Patient on morphine gtt, resting comfortably, anticipate in-hospital death  Consultants:   Spoke with Dr. Irene Limbo by phone, Oncology  Critical care  Palliative care  Procedures:  none  Antimicrobials:  Anti-infectives (From admission, onward)    Start     Dose/Rate Route Frequency Ordered Stop   03/28/17 0900  vancomycin (VANCOCIN) 500 mg in sodium chloride 0.9 % 100 mL IVPB  Status:  Discontinued     500 mg 100 mL/hr over 60 Minutes Intravenous Every 24 hours 03/27/17 0957 03/29/17 0831   03/27/17 0800  piperacillin-tazobactam (ZOSYN) IVPB 3.375 g     3.375 g 12.5 mL/hr over 240 Minutes Intravenous Every 8 hours 03/27/17 0729     03/27/17 0730  vancomycin (VANCOCIN) IVPB 1000 mg/200 mL premix     1,000 mg 200 mL/hr over 60 Minutes Intravenous  Once 03/27/17 0729 03/27/17 0920       Subjective: Asleep and unarouseable to light touch.  Was in pain overnight and started on morphine gtt.    Objective: Vitals:   03/29/17 2100 03/30/17 0616 03/30/17 2036 03/31/17 0530  BP:  111/80  (!) 83/49  Pulse:  (!) 122  (!) 113  Resp:  18  14  Temp:  99.1 F (37.3 C) (!) 103.2 F (39.6 C) (!) 103 F (39.4 C)  TempSrc: Axillary Axillary Axillary Axillary  SpO2:  100%  94%  Weight:  44.5 kg (98 lb)    Height:        Intake/Output Summary (Last 24 hours) at 03/31/2017 1358 Last data filed at 03/31/2017 1330 Gross per 24 hour  Intake 113.92 ml  Output 700 ml  Net -586.08 ml   Filed Weights   03/26/17 0252 03/29/17 1442 03/30/17 0616  Weight: 43.9 kg (96 lb 12.5 oz) 44.5 kg (98 lb) 44.5 kg (98 lb)    Examination:  General exam:  Adult female, asleep, no acute distress Cardiovascular system: tachycardic rate and rhythm Pulm:  No respiratory distress, lungs clear anteriorly Gastrointestinal system: Normal active bowel sounds, soft, distended, nontender MSK: anasarca   Data Reviewed: I have personally reviewed following labs and imaging studies  CBC: Recent Labs  Lab 03/31/2017 1513 03/26/17 0256 03/27/17 0348 03/28/17 0620 03/29/17 0345  WBC 10.9* 10.3 14.3* 10.3 10.0  NEUTROABS 8.0*  --  10.6*  --   --   HGB 8.0* 8.1* 10.3* 9.7* 9.2*  HCT 24.9* 25.0* 32.1* 30.8* 29.2*  MCV 83.3 83.6 84.3 85.3 84.6  PLT 79* 77* 67*  60* 73*   Basic Metabolic Panel: Recent Labs  Lab 04/06/2017 1513 03/26/17 0256 03/27/17 0348 03/27/17 1210 03/28/17 0620 03/29/17 0345  NA 134* 134* 136  --  136 137  K 4.0 4.6 5.0  --  4.6 4.9  CL 96* 101 103  --  106 108  CO2 25 22 19*  --  15* 17*  GLUCOSE 89 87 105*  --  84 82  BUN 21* 17 15  --  13 17  CREATININE 0.57 0.58 0.64  --  0.68 0.96  CALCIUM 9.7 9.4 9.7  --  9.1 9.3  MG  --   --  1.0*  --  1.7 1.8  PHOS  --   --   --  2.3* 2.7  --    GFR: Estimated Creatinine Clearance: 33.5 mL/min (by C-G formula based on SCr of 0.96 mg/dL). Liver Function Tests: Recent Labs  Lab 03/30/2017 1513 03/26/17 0256 03/27/17 0348 03/28/17 0620 03/29/17 0345  AST 77* 73* 109* 109* 108*  ALT 30 30 38 37 35  ALKPHOS 120 124 217* 193* 210*  BILITOT 0.8 1.0 0.8  0.8 1.1  PROT 4.5* 4.1* 4.5* 4.1* 4.1*  ALBUMIN 2.3* 2.1* 2.2* 1.8* 1.8*   Recent Labs  Lab 03/27/17 1210  LIPASE 11   No results for input(s): AMMONIA in the last 168 hours. Coagulation Profile: Recent Labs  Lab 03/27/17 0810  INR 1.04   Cardiac Enzymes: Recent Labs  Lab 04/06/2017 1513 03/23/2017 2041 03/26/17 0256 03/26/17 1041  TROPONINI 0.05* 0.06* 0.05* 0.04*   BNP (last 3 results) No results for input(s): PROBNP in the last 8760 hours. HbA1C: No results for input(s): HGBA1C in the last 72 hours. CBG: No results for input(s): GLUCAP in the last 168 hours. Lipid Profile: No results for input(s): CHOL, HDL, LDLCALC, TRIG, CHOLHDL, LDLDIRECT in the last 72 hours. Thyroid Function Tests: No results for input(s): TSH, T4TOTAL, FREET4, T3FREE, THYROIDAB in the last 72 hours. Anemia Panel: No results for input(s): VITAMINB12, FOLATE, FERRITIN, TIBC, IRON, RETICCTPCT in the last 72 hours. Urine analysis:    Component Value Date/Time   COLORURINE YELLOW 03/27/2017 [place] 03/27/2017 0347   LABSPEC 1.020 03/27/2017 0347   PHURINE 5.0 03/27/2017 0347   GLUCOSEU NEGATIVE 03/27/2017 0347    HGBUR MODERATE (A) 03/27/2017 0347   BILIRUBINUR NEGATIVE 03/27/2017 0347   KETONESUR NEGATIVE 03/27/2017 0347   PROTEINUR NEGATIVE 03/27/2017 0347   UROBILINOGEN 0.2 04/30/2011 1145   NITRITE NEGATIVE 03/27/2017 0347   LEUKOCYTESUR SMALL (A) 03/27/2017 0347   Sepsis Labs: @LABRCNTIP (procalcitonin:4,lacticidven:4)  ) Recent Results (from the past 240 hour(s))  Culture, Urine     Status: None   Collection Time: 03/26/17  1:48 PM  Result Value Ref Range Status   Specimen Description URINE, CATHETERIZED  Final   Special Requests NONE  Final   Culture   Final    NO GROWTH Performed at [facility], [place] [address], [place], [place] [address]    Report Status 03/27/2017 FINAL  Final  Culture, blood (routine x 2)     Status: None (Preliminary result)   Collection Time: 03/27/17  3:55 AM  Result Value Ref Range Status   Specimen Description BLOOD LEFT FOREARM  Final   Special Requests   Final    BOTTLES DRAWN AEROBIC AND ANAEROBIC Blood Culture adequate volume   Culture   Final    NO GROWTH 3 DAYS Performed at [facility], [place] [address], [place], [place] [address]    Report Status PENDING  Incomplete  Culture, blood (routine x 2)     Status: None (Preliminary result)   Collection Time: 03/27/17  4:02 AM  Result Value Ref Range Status   Specimen Description BLOOD LEFT ANTECUBITAL  Final   Special Requests   Final    BOTTLES DRAWN AEROBIC AND ANAEROBIC Blood Culture adequate volume   Culture   Final    NO GROWTH 3 DAYS Performed at [facility], [place] [address] [place], [place] [address]    Report Status PENDING  Incomplete  MRSA PCR Screening     Status: None   Collection Time: 03/27/17 10:50 AM  Result Value Ref Range Status   MRSA by PCR NEGATIVE NEGATIVE Final    Comment:        The GeneXpert MRSA Assay (FDA approved for NASAL specimens only), is one component of a comprehensive MRSA colonization surveillance program. It is not intended to diagnose  MRSA infection nor to guide or monitor treatment for MRSA infections.       Radiology Studies: No results found.  Scheduled Meds: . Chlorhexidine Gluconate Cloth  6 each Topical Daily  . hyaluronate sodium  1 application Topical BID  . hydrocerin   Topical BID   Continuous Infusions: . morphine 5 mg/hr (03/31/17 0313)  . piperacillin-tazobactam 3.375 g (03/31/17 0800)     LOS: 5 days    Time spent: 30 min    [name], MD [facility] Pager [phone]  If 7PM-7AM, please contact night-coverage [email or web address] Password [email or web address] 03/31/2017, 1:58 PM

## 2017-03-31 NOTE — Progress Notes (Signed)
I have notified Dr Sheran Fava that the family of Christina Peterson wish to stop all antibiotics

## 2017-03-31 NOTE — Progress Notes (Signed)
Pt requiring multiple PRN doses of dilaudid for comfort during the night. Pt is visibly agitated at this time, jerky and moaning, though not verbal responsive. Family at bedside is concerned that the pt is in pain. Discussed with family about pt's possible need for continuous analgesic gtt. Family agreeable at this time and requesting that the continuous gtt be started for improved comfort measures. MD on call notified and orders received.

## 2017-03-31 NOTE — Progress Notes (Signed)
PMT no charge note  Overnight events noted, agree with Morphine drip for ongoing end of life care, add low dose IV Robinul prn for secretions, discussed with bedside RN, no additional PMT recommendations at this time.   Loistine Chance MD [facility] health palliative medicine team [phone]

## 2017-04-01 LAB — CULTURE, BLOOD (ROUTINE X 2)
Culture: NO GROWTH
Culture: NO GROWTH
Special Requests: ADEQUATE
Special Requests: ADEQUATE

## 2017-04-03 ENCOUNTER — Other Ambulatory Visit: Payer: Medicare Other

## 2017-04-03 ENCOUNTER — Ambulatory Visit: Payer: Medicare Other | Admitting: Hematology

## 2017-04-04 ENCOUNTER — Other Ambulatory Visit: Payer: Self-pay | Admitting: Nurse Practitioner

## 2017-04-15 NOTE — Death Summary Note (Signed)
Death Summary  Christina Peterson JHE:[id number] DOB: [birth year] DOA: 04-03-2017  PCP: Junie Panning, NP  Admit date: 04-03-2017 Date of Death: 2017/04/10 Time of Death: 0445 AM Notification: Smothers, Andree Elk, NP notified of death of 04/10/17   History of present illness:  Christina Peterson is a 77 y.o. 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 Patient previously lived by herself but was gradually declining especially her functional status and her appetite.She had been able to walk with the help of walker but recently she was too weak to get out of her chair. She had nausea and anorexia with weight loss.  Denied shortness of breath, cough.  CXR negative.  UA negative.  After discussion with oncologist on 12/12, decided to proceed with blood transfusion to help with energy.  She developed tachycardia, high fevers around the time of her transfusion, but the blood bank did not feel this was a transfusion reaction, tests were negative.  Repeat CXR demonstrated a LLL pneumonia, but she was also having abdominal distension and pain.  Her mentation was waxing and waning and her lactic acid the following morning was 9.4.  She was started on IV fluids and antibiotics and transferred to the stepdown unit.  Critical care was consulted.  She underwent a head CT which was negative for any acute change.  Her abdominal CT showed rapidly progressive cancer with large intra-abdominal lymphadenopathy and masses, some of which were eroding into her left kidney and causing left hydronephrosis.  She had innumerable liver mets.  She also had a possible large abdominal abscess vs. Malignant ascites.  She had a conversation with critical care and palliative medicine on 12/13  and was transitioned to DNR.  Despite IV fluids and antibiotics, she has continued to decline with fevers, hypotension, ongoing tachycardia.    I suspect that her fevers, hypotension, tachycardia were not secondary to infection but rather secondary to her progressive malignancy.  Ultimately her IVF and antibiotics were discontinued.  She had worsening pain and was eventually started on a morphine drip.  She died at 23 AM on 04-09-2023.    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    Suspect that her progressive malignancy led to her sepsis-like picture towards the end. -  Oncology assistance appreciated -  She was not a candidate for further therapies  Severe sepsis secondary to LLL pneumonia versus SIRS due to rapidly progressive cancer with tachycardia, tachypnea, fevers, and elevated lactic acid, hypotension, and poor mentation.  -  Blood cultures no growth to date  Nausea, vomiting and abdominal pain, due to rapidly progressive cancer.  Started on morphine gtt due to more persistent pain -  Palliative care assisted with symptom management  Acute metabolic encephalopathy in setting of malignancy, metabolic acidosis/lactic acidosis, sepsis, worsening -  CT head negative for acute change  Elevated LDH, HIGH, consistent with rapidly progressive lymphoma and liver involvement  SVT, no intervention for now, emphasizing comfort  Severe protein calorie malnutrition  Possible dysphagia, no further workup was performed due to emphasis on comfort  Mild elevation in troponin likely demand ischemia, troponins flat and inconsistent with ACS -  Echo done in October 2018 consistent with EF of 55-60%, will not repeat. She had severe MR.  Mild hyponatremia, mild transaminase, chronic  normocytic anemia likely due to malignancy, chemotherapy, mild leukocytosis noted in the lab  Acute urinary retention, likely  related to pain medication -  Placed foley catheter on 12/13   The results of significant diagnostics from this hospitalization (including imaging, microbiology, ancillary and laboratory) are listed below for reference.    Significant Diagnostic Studies: Dg Chest 2 View  Result Date: 04/14/2017 CLINICAL DATA:  Weakness for 3 days.  Bilateral hip pain. EXAM: CHEST  2 VIEW COMPARISON:  PA and lateral chest 12/19/2017. FINDINGS: Port-A-Cath remains in place. There is cardiomegaly without edema. No consolidative process, pneumothorax or effusion. No acute bony abnormality. IMPRESSION: Cardiomegaly without acute disease. Electronically Signed   By: Inge Rise M.D.   On: 03/31/2017 17:56   Ct Head Wo Contrast  Result Date: 03/27/2017 CLINICAL DATA:  77 year old female with diffuse large B-cell lymphoma status post chemotherapy and radiation. Altered level of consciousness. EXAM: CT HEAD WITHOUT CONTRAST TECHNIQUE: Contiguous axial images were obtained from the base of the skull through the vertex without intravenous contrast. COMPARISON:  PET-CT 01/15/2017.  Brain MRI 08/22/2016. FINDINGS: Brain: Stable cerebral volume. No midline shift, ventriculomegaly, mass effect, evidence of mass lesion, intracranial hemorrhage or evidence of cortically based acute infarction. Gray-white matter differentiation is within normal limits throughout the brain. Vascular: Mild Calcified atherosclerosis at the skull base. No suspicious intracranial vascular hyperdensity. Skull: Stable bone mineralization. No acute or suspicious osseous lesion identified. Sinuses/Orbits: Chronic hyperostosis of the skull base and visible paranasal sinuses. Tympanic cavities and mastoids remain clear. Other: Negative visible orbit and scalp soft tissues. IMPRESSION: No acute intracranial abnormality. Negative for age noncontrast CT appearance of the brain. Electronically Signed   By: Genevie Ann M.D.   On: 03/27/2017 14:08   Ct Abdomen  Pelvis W Contrast  Result Date: 03/27/2017 CLINICAL DATA:  Diffuse large B-cell lymphoma. Abdominal pain. Fever and abscesses. EXAM: CT ABDOMEN AND PELVIS WITH CONTRAST TECHNIQUE: Multidetector CT imaging of the abdomen and pelvis was performed using the standard protocol following bolus administration of intravenous contrast. CONTRAST:  100 cc of Isovue-300 COMPARISON:  01/15/2017 FINDINGS: Lower chest: There are moderate bilateral pleural effusions with overlying compressive type atelectasis. Hepatobiliary: There are numerous low-attenuation lesions identified throughout both lobes of liver. This is a new finding when compared with previous examination. Index lesion within anterior right lobe of liver 1.1 cm, image 15 of series 2. Index lesion within the lateral segment of left lobe of liver measures 1.4 cm, image 18 of series 2. Posterior right lobe of liver lesion measures 1 cm, image 27 of series 2. Gallbladder normal. No biliary dilatation. Pancreas: Unremarkable. No pancreatic ductal dilatation or surrounding inflammatory changes. Spleen: Small low density structure within the spleen measures 7 mm, image 23 of series 2. New from previous exam. Adrenals/Urinary Tract: Normal appearance of the right adrenal gland. New left adrenal nodule/mass measures 2.9 x 2.8 cm, image number 28 of series 2. Nodule in the right adrenal gland measures 1.7 cm, image 26 of series 2. Complex mass within the inferior pole of the right kidney is again identified measuring 4.3 x 3.8 cm, image 43 of series 2. Similar to previous exam. There is progressive left-sided hydronephrosis. There is a new soft tissue mass within the left periaortic retroperitoneum extending into the left renal hilum measuring 3.1 x 3.8 x 6.0 cm. This encases both the left renal vein and left renal artery. Bladder collapsed around a Foley catheter. Stomach/Bowel: Small hiatal hernia. No pathologic dilatation of the small  bowel loops. No abnormal dilatation  of the colon. Vascular/Lymphatic: Aortic atherosclerosis. No aneurysm. Extensive adenopathy within the abdomen is identified. Retrocaval lymph node at the level of the right renal hilum measures 4.1 x 2.5 cm, image 37 of series 2. Previously 3.8 x 2.0 cm. Large necrotic lymph node within the portacaval region is new measuring 2.4 x 3.3 cm, image 29 of series 2. Nodal mass within the lower mesenteric measures 6.4 x 4.3 cm, image 46 of series 2. Previously 5.6 x 3.4 cm. Reproductive: Uterus and adnexal structures are not visualized and may be surgically absent. Other: There is new abdominal and pelvic ascites. Loculated fluid within the posterior pelvis is new measuring 5.3 x 8.3 x 8.9 cm. Diffuse body wall edema is identified compatible with anasarca. Musculoskeletal: Previous left hip arthroplasty. No aggressive lytic or sclerotic bone lesions identified. IMPRESSION: 1. Overall there has been interval progression of disease. 2. Interval development of multiple low-attenuation lesions throughout the liver compatible with metastatic disease. 3. Progressive periaortic adenopathy with involvement of the left renal hilum and worsening left-sided hydronephrosis. 4. Increase in a aortocaval and portacaval adenopathy. 5. New ascites and diffuse body wall edema. 6. Large loculated fluid collection within the posterior pelvis may represent abscess or loculated malignant ascites. 7. Bilateral adrenal gland lesions, new from previous exam. 8. Similar appearance of right kidney lower pole mass which is favored to represent a renal cell carcinoma. 9. Progressive bilateral pleural effusions. Electronically Signed   By: Kerby Moors M.D.   On: 03/27/2017 14:31   Dg Esophagus  Result Date: 03/04/2017 CLINICAL DATA:  Dysphagia.  Lymphoma. EXAM: ESOPHOGRAM / BARIUM SWALLOW / BARIUM TABLET STUDY TECHNIQUE: Combined double contrast and single contrast examination performed using effervescent crystals, thick barium liquid, and  thin barium liquid. The patient was observed with fluoroscopy swallowing a 13 mm barium sulphate tablet. FLUOROSCOPY TIME:  Fluoroscopy Time:  1 minutes 54 second Radiation Exposure Index (if provided by the fluoroscopic device): Number of Acquired Spot Images: 0 COMPARISON:  None. FINDINGS: Mild narrowing of the proximal esophagus at C5-6. Smooth mucosal surface. Narrowing was not appreciated on initial swallowing however the barium tablet did not pass this area despite multiple swallows of water and barium. No diverticulum or mass is seen in this area. Mid and distal esophagus mildly dilated with decreased peristalsis. Small hiatal hernia without reflux. IMPRESSION: Mild narrowing proximal esophagus at C5-6. Barium tablet did not pass this area. Mucosa is smooth in this area which appears benign Mild decreased esophageal peristalsis.  Small hiatal hernia Electronically Signed   By: Franchot Gallo M.D.   On: 03/04/2017 11:06   Dg Chest Port 1 View  Result Date: 03/28/2017 CLINICAL DATA:  Pneumonia. EXAM: PORTABLE CHEST 1 VIEW COMPARISON:  03/27/2017. FINDINGS: Port-A-Cath in stable position. Stable cardiomegaly. Progressive diffuse left lung infiltrate. Progressive small left pleural effusion. Persistent mild right base atelectasis. No pneumothorax . IMPRESSION: 1. Port-A-Cath in stable position. 2. Progressive diffuse left lung infiltrate most consistent pneumonia. Progressive small left pleural effusion. Mild right base atelectasis again noted. 3. Stable cardiomegaly . Electronically Signed   By: Marcello Moores  Register   On: 03/28/2017 06:42   Dg Chest Port 1 View  Result Date: 03/27/2017 CLINICAL DATA:  Fever.  Large B-cell lymphoma EXAM: PORTABLE CHEST 1 VIEW COMPARISON:  March 25, 2017 FINDINGS: Port-A-Cath tip at cavoatrial junction. Huber needle is position within the port. No pneumothorax. There is airspace consolidation in the left lower lobe and probable inferior lingula with left  pleural effusion.  There is atelectatic change in the right base. Heart is mildly enlarged with pulmonary vascularity within normal limits. No adenopathy. There is degenerative change in each shoulder. IMPRESSION: Port-A-Cath as described without pneumothorax. Airspace consolidation left lower lobe and probable inferior lingula consistent with pneumonia. Small left pleural effusion. There is right base atelectasis. Cardiac silhouette is stable. Electronically Signed   By: Lowella Grip III M.D.   On: 03/27/2017 09:29   Dg Hips Bilat W Or Wo Pelvis 2 Views  Result Date: 04/05/2017 CLINICAL DATA:  Weakness bilateral hip pain for 3 days. No known injury. EXAM: DG HIP (WITH OR WITHOUT PELVIS) 2V BILAT COMPARISON:  None. FINDINGS: There is no acute bony or joint abnormality. 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 Moderate to moderately severe degenerative disease about the right hip is seen. The patient has bilateral sacroiliitis which appears remote and worse on the left. Soft tissues are unremarkable. IMPRESSION: No acute abnormality. 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 Electronically Signed   By: Inge Rise M.D.   On: 03/16/2017 17:57    Microbiology: Recent Results (from the past 240 hour(s))  Culture, Urine     Status: None   Collection Time: 03/26/17  1:48 PM  Result Value Ref Range Status   Specimen Description URINE, CATHETERIZED  Final   Special Requests NONE  Final   Culture   Final    NO GROWTH Performed at [facility], [address], [place], [place] [address]    Report Status 03/27/2017 FINAL  Final  Culture, blood (routine x 2)     Status: None   Collection Time: 03/27/17  3:55 AM  Result Value Ref Range Status   Specimen Description BLOOD LEFT FOREARM  Final   Special Requests   Final    BOTTLES DRAWN AEROBIC AND ANAEROBIC Blood Culture adequate volume   Culture   Final    NO GROWTH 5 DAYS Performed at [facility], [place] [address] [place], [place] [address]    Report Status 04-26-2017 FINAL  Final  Culture, blood (routine x 2)     Status: None   Collection Time: 03/27/17  4:02 AM  Result Value Ref Range Status   Specimen Description BLOOD LEFT ANTECUBITAL  Final   Special Requests   Final    BOTTLES DRAWN AEROBIC AND ANAEROBIC Blood Culture adequate volume   Culture   Final    NO GROWTH 5 DAYS Performed at [facility], [place] [address], [place], [place] [address]    Report Status April 26, 2017 FINAL  Final  MRSA PCR Screening     Status: None   Collection Time: 03/27/17 10:50 AM  Result Value Ref Range Status   MRSA by PCR NEGATIVE NEGATIVE Final    Comment:        The GeneXpert MRSA Assay (FDA approved for NASAL specimens only), is one component of a comprehensive MRSA colonization surveillance program. It is not intended to diagnose MRSA infection nor to guide or monitor treatment for MRSA infections.      Labs: Basic Metabolic Panel: Recent Labs  Lab 03/26/17 0256 03/27/17 0348 03/27/17 1210 03/28/17 0620 03/29/17 0345  NA 134* 136  --  136 137  K 4.6 5.0  --  4.6 4.9  CL 101 103  --  106 108  CO2 22 19*  --  15* 17*  GLUCOSE 87 105*  --  84 82  BUN 17 15  --  13 17  CREATININE 0.58 0.64  --  0.68 0.96  CALCIUM 9.4 9.7  --  9.1 9.3  MG  --  1.0*  --  1.7 1.8  PHOS  --   --  2.3* 2.7  --    Liver Function Tests: Recent Labs  Lab 03/26/17 0256 03/27/17 0348 03/28/17 0620 03/29/17 0345  AST 73* 109* 109* 108*  ALT 30 38 37 35  ALKPHOS 124 217* 193* 210*  BILITOT 1.0 0.8 0.8 1.1  PROT 4.1* 4.5* 4.1* 4.1*  ALBUMIN 2.1* 2.2* 1.8* 1.8*   Recent Labs  Lab 03/27/17 1210  LIPASE 11   No results for input(s): AMMONIA in the last 168 hours. CBC: Recent Labs  Lab 03/26/17 0256 03/27/17 0348 03/28/17 0620 03/29/17 0345  WBC 10.3 14.3* 10.3 10.0  NEUTROABS  --  10.6*  --   --   HGB 8.1* 10.3* 9.7* 9.2*  HCT 25.0* 32.1* 30.8* 29.2*  MCV 83.6 84.3  85.3 84.6  PLT 77* 67* 60* 73*   Cardiac Enzymes: Recent Labs  Lab 03/26/2017 2041 03/26/17 0256 03/26/17 1041  TROPONINI 0.06* 0.05* 0.04*   D-Dimer No results for input(s): DDIMER in the last 72 hours. BNP: Invalid input(s): POCBNP CBG: No results for input(s): GLUCAP in the last 168 hours. Anemia work up No results for input(s): VITAMINB12, FOLATE, FERRITIN, TIBC, IRON, RETICCTPCT in the last 72 hours. Urinalysis    Component Value Date/Time   COLORURINE YELLOW 03/27/2017 [place] 03/27/2017 0347   LABSPEC 1.020 03/27/2017 0347   PHURINE 5.0 03/27/2017 0347   GLUCOSEU NEGATIVE 03/27/2017 0347   HGBUR MODERATE (A) 03/27/2017 0347   BILIRUBINUR NEGATIVE 03/27/2017 0347   KETONESUR NEGATIVE 03/27/2017 0347   PROTEINUR NEGATIVE 03/27/2017 0347   UROBILINOGEN 0.2 04/30/2011 1145   NITRITE NEGATIVE 03/27/2017 0347   LEUKOCYTESUR SMALL (A) 03/27/2017 0347   Sepsis Labs Invalid input(s): PROCALCITONIN,  WBC,  LACTICIDVEN     SIGNED:  [name], MD  [facility] 04-27-17, 4:01 PM Pager   If 7PM-7AM, please contact night-coverage [email or web address] Password [email or web address]

## 2017-04-15 NOTE — Progress Notes (Signed)
Huber needle removed from rt chest PAC.Foley cath removed. Body was cleaned and placed in the post mortem bag with a lable with her name and info on her rt great toe and the death certificate in aplastic bag . She wa taken to the morgue

## 2017-04-15 NOTE — Progress Notes (Signed)
Patient expired at 0440.  Page sent to Astrid Divine FNP for death certificate through [facility]. 100mg  of IV Morphine was waster with RN Neil Crouch.  Family present at bedside for two nurse confirmation.

## 2017-04-15 NOTE — Care Management Important Message (Signed)
Important Message  Patient Details  Name: Christina Peterson MRN: [id number] Date of Birth: 04-06-1941   Medicare Important Message Given:  Yes    Kerin Salen 04/09/17, 12:31 [place] Message  Patient Details  Name: Christina Peterson MRN: [id number] Date of Birth: [birth year]   Medicare Important Message Given:  Yes    Kerin Salen Apr 09, 2017, 12:31 PM

## 2017-04-15 DEATH — deceased

## 2017-04-16 ENCOUNTER — Ambulatory Visit: Payer: Self-pay | Admitting: Radiation Oncology

## 2017-06-05 ENCOUNTER — Ambulatory Visit: Payer: Medicare Other | Admitting: Cardiology

## 2019-04-26 IMAGING — RF DG ESOPHAGUS
9 series · 24 of 24 positions shown · non-contrast
Comparison: None.

CLINICAL DATA: Dysphagia.  Lymphoma.

EXAM:
ESOPHOGRAM / BARIUM SWALLOW / BARIUM TABLET STUDY
TECHNIQUE: Combined double contrast and single contrast examination performed
using effervescent crystals, thick barium liquid, and thin barium
liquid. The patient was observed with fluoroscopy swallowing a 13 mm
barium sulphate tablet.
FLUOROSCOPY TIME:  Fluoroscopy Time:  1 minutes 54 second
Radiation Exposure Index (if provided by the fluoroscopic device):
Number of Acquired Spot Images: 0

[Series 1: cp_standard · 0.18mm/px · 1 of 1 slices shown (1 of 9)]
[im 1/1]
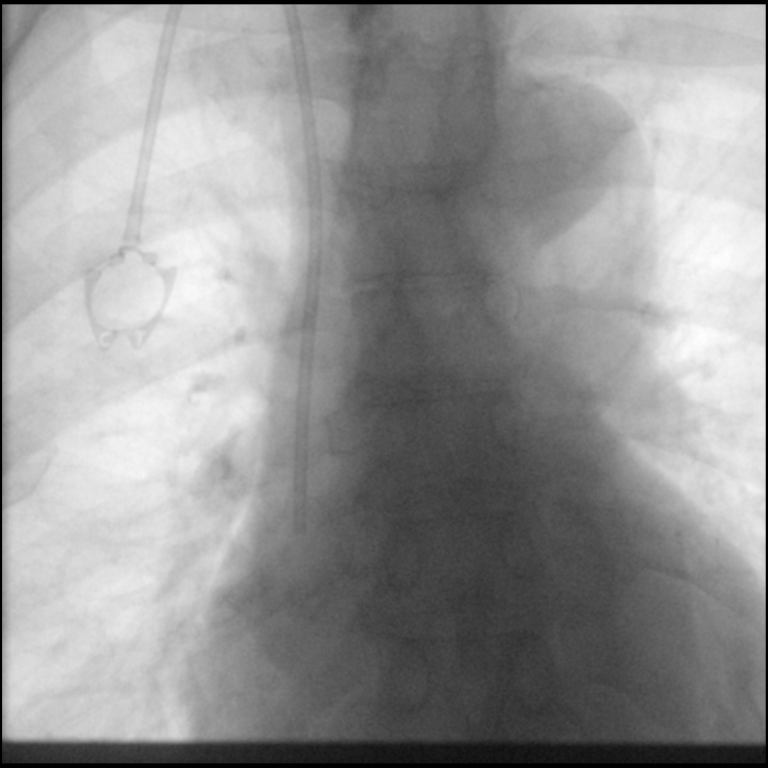

[Series 2: cp_standard · 0.38mm/px · 3 of 138 frames shown (2 of 9)]
[frame 6/138]
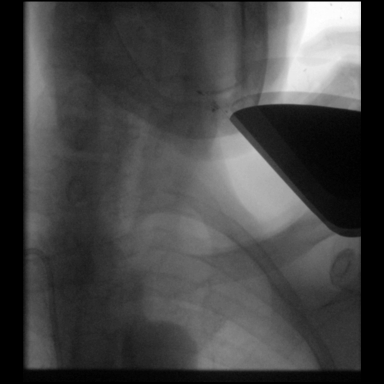
[frame 21/138]
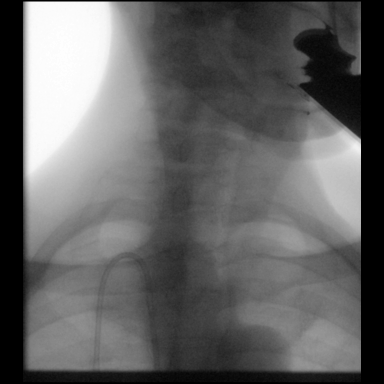
[frame 118/138]
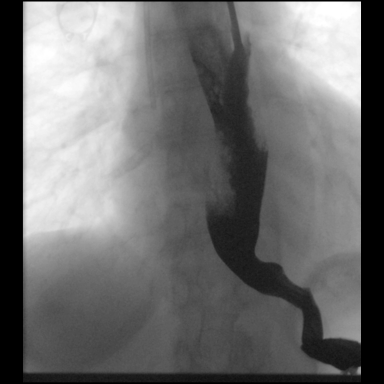

[Series 3: cp_standard · 0.41mm/px · 4 of 72 frames shown (3 of 9)]
[frame 11/72]
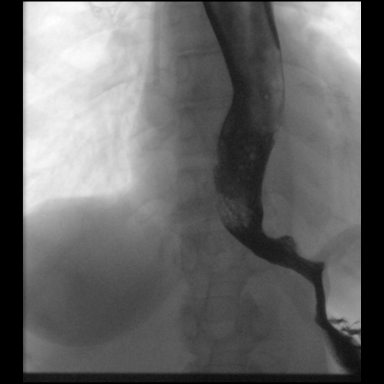
[frame 25/72]
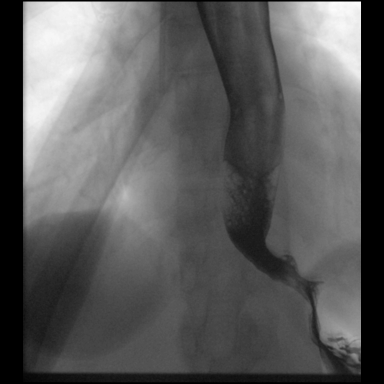
[frame 37/72]
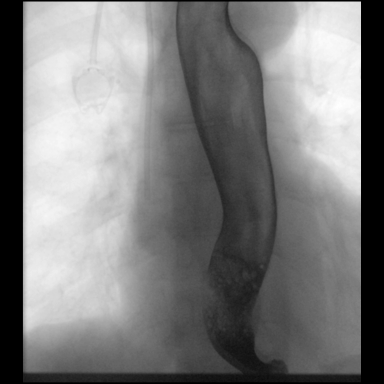
[frame 62/72]
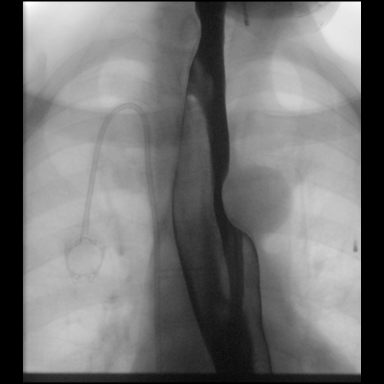

[Series 4: cp_standard · 0.41mm/px · 2 of 81 frames shown (4 of 9)]
[frame 13/81]
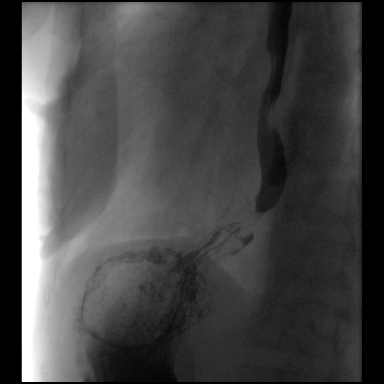
[frame 69/81]
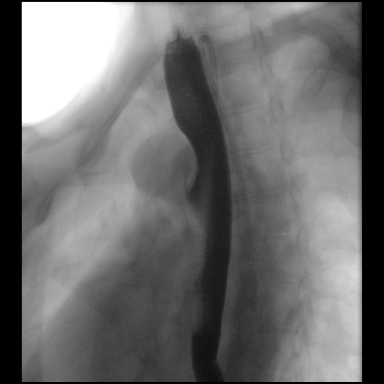

[Series 5: cp_standard · 0.41mm/px · 4 of 169 frames shown (5 of 9)]
[frame 26/169]
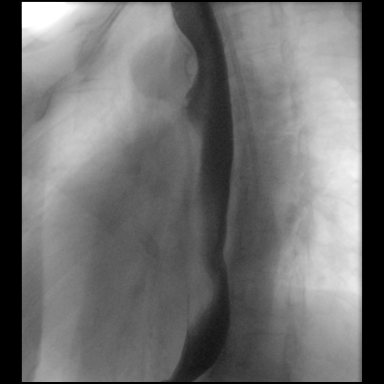
[frame 58/169]
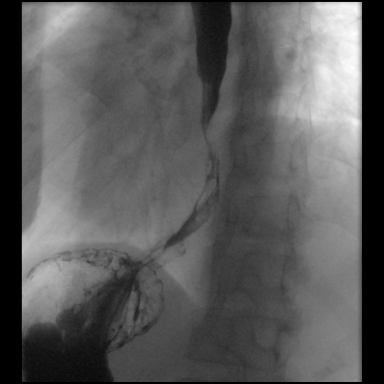
[frame 85/169]
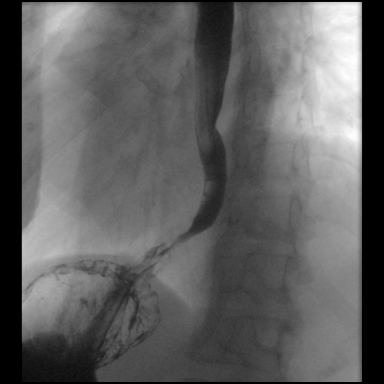
[frame 144/169]
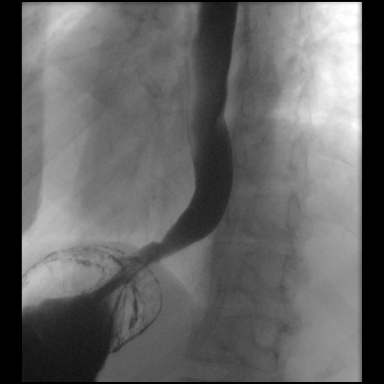

[Series 6: cp_standard · 0.38mm/px · 3 of 142 frames shown (6 of 9)]
[frame 22/142]
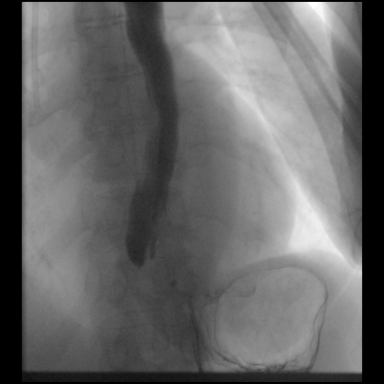
[frame 72/142]
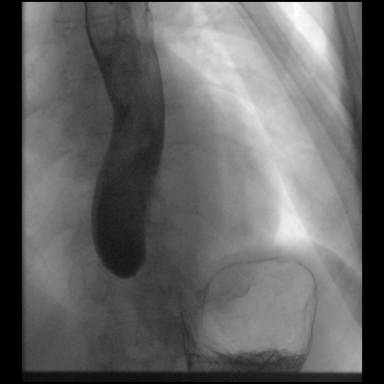
[frame 121/142]
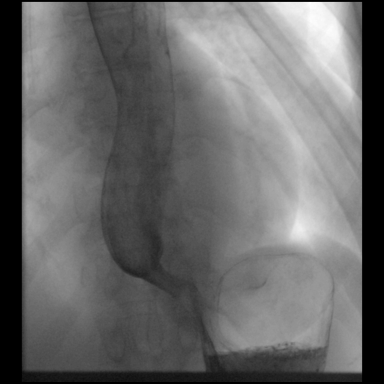

[Series 7: cp_standard · 0.19mm/px · 1 of 1 slices shown (7 of 9)]
[im 1/1]
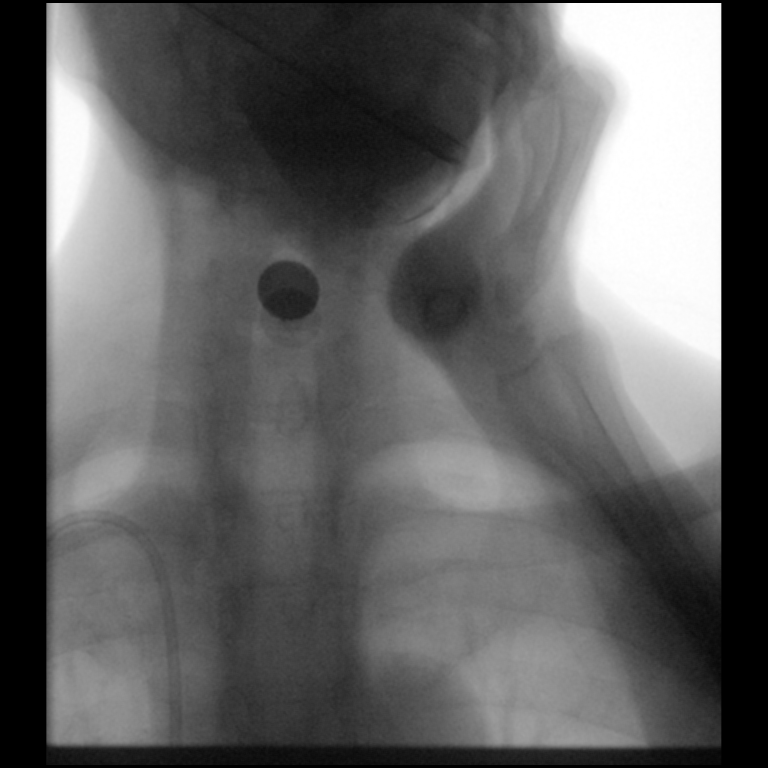

[Series 8: cp_standard · 0.38mm/px · 3 of 154 frames shown (8 of 9)]
[frame 24/154]
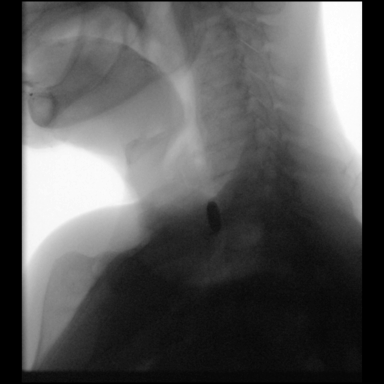
[frame 78/154]
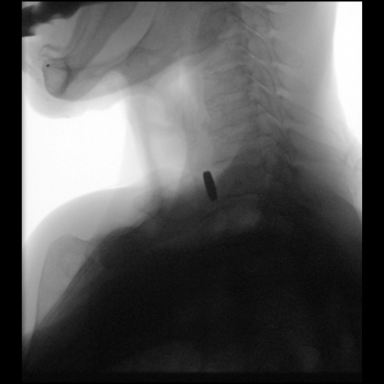
[frame 131/154]
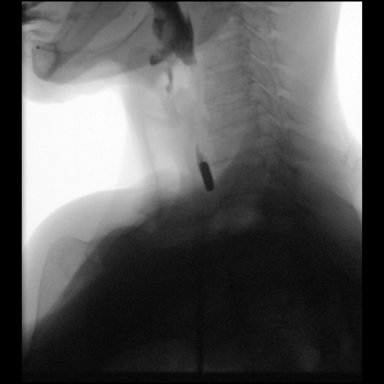

[Series 9: cp_standard · 0.40mm/px · 3 of 64 frames shown (9 of 9)]
[frame 10/64]
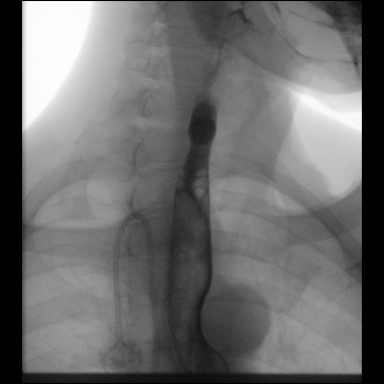
[frame 33/64]
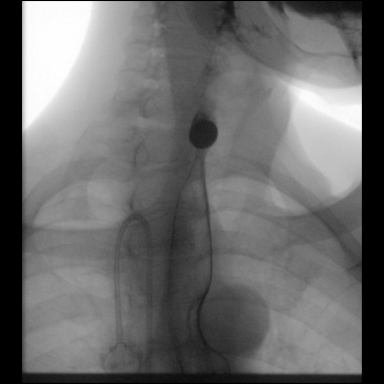
[frame 55/64]
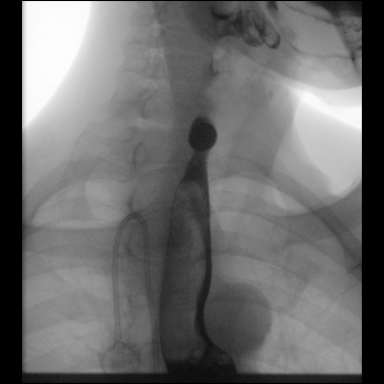

[24 of 24 positions shown; findings below may reference images not displayed]

FINDINGS: Mild narrowing of the proximal esophagus at C5-6. Smooth mucosal
surface. Narrowing was not appreciated on initial swallowing however
the barium tablet did not pass this area despite multiple swallows
of water and barium. No diverticulum or mass is seen in this area.

Mid and distal esophagus mildly dilated with decreased peristalsis.
Small hiatal hernia without reflux.
IMPRESSION: Mild narrowing proximal esophagus at C5-6. Barium tablet did not
pass this area. Mucosa is smooth in this area which appears benign

Mild decreased esophageal peristalsis.  Small hiatal hernia
